# Patient Record
Sex: Female | Born: 1965 | Hispanic: Yes | Marital: Married | Smoking: Never smoker
Health system: Southern US, Community
[De-identification: ages and names within clinical notes are randomized; demographics above are authoritative.]

## PROBLEM LIST (undated history)

## (undated) ENCOUNTER — Encounter

## (undated) ENCOUNTER — Encounter: Payer: MEDICAID | Attending: Nephrology | Primary: Nephrology

## (undated) ENCOUNTER — Telehealth

## (undated) ENCOUNTER — Ambulatory Visit: Payer: MEDICAID | Attending: Nephrology | Primary: Nephrology

## (undated) ENCOUNTER — Ambulatory Visit

## (undated) ENCOUNTER — Encounter: Attending: Nephrology | Primary: Nephrology

## (undated) ENCOUNTER — Encounter: Attending: Obstetrics & Gynecology | Primary: Obstetrics & Gynecology

## (undated) ENCOUNTER — Encounter: Payer: MEDICAID | Attending: Surgery | Primary: Surgery

## (undated) ENCOUNTER — Encounter: Payer: MEDICARE | Attending: Nephrology | Primary: Nephrology

## (undated) ENCOUNTER — Encounter: Payer: MEDICAID | Attending: Urology | Primary: Urology

## (undated) ENCOUNTER — Ambulatory Visit: Payer: MEDICAID

## (undated) ENCOUNTER — Non-Acute Institutional Stay: Payer: MEDICAID

## (undated) ENCOUNTER — Encounter: Attending: Anesthesiology | Primary: Anesthesiology

## (undated) ENCOUNTER — Encounter: Attending: Physician Assistant | Primary: Physician Assistant

## (undated) ENCOUNTER — Ambulatory Visit: Attending: Surgery | Primary: Surgery

## (undated) DIAGNOSIS — D649 Anemia, unspecified: Secondary | ICD-10-CM

## (undated) DIAGNOSIS — M810 Age-related osteoporosis without current pathological fracture: Secondary | ICD-10-CM

## (undated) DIAGNOSIS — I499 Cardiac arrhythmia, unspecified: Secondary | ICD-10-CM

## (undated) DIAGNOSIS — K219 Gastro-esophageal reflux disease without esophagitis: Secondary | ICD-10-CM

## (undated) DIAGNOSIS — IMO0001 Reserved for inherently not codable concepts without codable children: Secondary | ICD-10-CM

## (undated) DIAGNOSIS — I10 Essential (primary) hypertension: Secondary | ICD-10-CM

## (undated) DIAGNOSIS — M329 Systemic lupus erythematosus, unspecified: Secondary | ICD-10-CM

## (undated) DIAGNOSIS — I739 Peripheral vascular disease, unspecified: Secondary | ICD-10-CM

## (undated) DIAGNOSIS — N189 Chronic kidney disease, unspecified: Secondary | ICD-10-CM

## (undated) DIAGNOSIS — IMO0002 Reserved for concepts with insufficient information to code with codable children: Secondary | ICD-10-CM

## (undated) DIAGNOSIS — C801 Malignant (primary) neoplasm, unspecified: Secondary | ICD-10-CM

## (undated) HISTORY — PX: ARTERIOVENOUS GRAFT PLACEMENT: SUR1029

## (undated) HISTORY — PX: OTHER SURGICAL HISTORY: SHX169

## (undated) HISTORY — PX: DILATION AND CURETTAGE OF UTERUS: SHX78

## (undated) MED ORDER — MYCOPHENOLATE SODIUM 180 MG TABLET,DELAYED RELEASE: 540 mg | tablet | Freq: Two times a day (BID) | 11 refills | 30 days | Status: CN

## (undated) MED ORDER — MAGNESIUM OXIDE 400 MG (241.3 MG MAGNESIUM) TABLET: 400 mg | tablet | Freq: Every morning | 11 refills | 30 days | Status: CN

## (undated) MED ORDER — TACROLIMUS 0.5 MG CAPSULE, IMMEDIATE-RELEASE: capsule | 11 refills | 0 days | Status: CN

---
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
## 2014-07-19 DIAGNOSIS — D123 Benign neoplasm of transverse colon: Secondary | ICD-10-CM | POA: Diagnosis not present

## 2014-07-19 DIAGNOSIS — K621 Rectal polyp: Secondary | ICD-10-CM | POA: Diagnosis not present

## 2014-07-19 DIAGNOSIS — K648 Other hemorrhoids: Secondary | ICD-10-CM | POA: Diagnosis not present

## 2014-07-19 DIAGNOSIS — Z1211 Encounter for screening for malignant neoplasm of colon: Secondary | ICD-10-CM | POA: Diagnosis not present

## 2015-01-06 DIAGNOSIS — D509 Iron deficiency anemia, unspecified: Secondary | ICD-10-CM | POA: Diagnosis not present

## 2015-01-06 DIAGNOSIS — K759 Inflammatory liver disease, unspecified: Secondary | ICD-10-CM | POA: Diagnosis not present

## 2015-01-06 DIAGNOSIS — N186 End stage renal disease: Secondary | ICD-10-CM | POA: Diagnosis not present

## 2015-01-06 DIAGNOSIS — D631 Anemia in chronic kidney disease: Secondary | ICD-10-CM | POA: Diagnosis not present

## 2015-01-06 DIAGNOSIS — N2581 Secondary hyperparathyroidism of renal origin: Secondary | ICD-10-CM | POA: Diagnosis not present

## 2015-01-09 DIAGNOSIS — D631 Anemia in chronic kidney disease: Secondary | ICD-10-CM | POA: Diagnosis not present

## 2015-01-09 DIAGNOSIS — N186 End stage renal disease: Secondary | ICD-10-CM | POA: Diagnosis not present

## 2015-01-09 DIAGNOSIS — E878 Other disorders of electrolyte and fluid balance, not elsewhere classified: Secondary | ICD-10-CM | POA: Diagnosis not present

## 2015-01-09 DIAGNOSIS — K759 Inflammatory liver disease, unspecified: Secondary | ICD-10-CM | POA: Diagnosis not present

## 2015-01-09 DIAGNOSIS — N2581 Secondary hyperparathyroidism of renal origin: Secondary | ICD-10-CM | POA: Diagnosis not present

## 2015-01-09 DIAGNOSIS — E785 Hyperlipidemia, unspecified: Secondary | ICD-10-CM | POA: Diagnosis not present

## 2015-01-09 DIAGNOSIS — R7989 Other specified abnormal findings of blood chemistry: Secondary | ICD-10-CM | POA: Diagnosis not present

## 2015-01-09 DIAGNOSIS — E559 Vitamin D deficiency, unspecified: Secondary | ICD-10-CM | POA: Diagnosis not present

## 2015-01-09 DIAGNOSIS — D509 Iron deficiency anemia, unspecified: Secondary | ICD-10-CM | POA: Diagnosis not present

## 2015-01-11 DIAGNOSIS — D631 Anemia in chronic kidney disease: Secondary | ICD-10-CM | POA: Diagnosis not present

## 2015-01-11 DIAGNOSIS — K759 Inflammatory liver disease, unspecified: Secondary | ICD-10-CM | POA: Diagnosis not present

## 2015-01-11 DIAGNOSIS — N186 End stage renal disease: Secondary | ICD-10-CM | POA: Diagnosis not present

## 2015-01-11 DIAGNOSIS — N2581 Secondary hyperparathyroidism of renal origin: Secondary | ICD-10-CM | POA: Diagnosis not present

## 2015-01-11 DIAGNOSIS — D509 Iron deficiency anemia, unspecified: Secondary | ICD-10-CM | POA: Diagnosis not present

## 2015-01-13 DIAGNOSIS — K759 Inflammatory liver disease, unspecified: Secondary | ICD-10-CM | POA: Diagnosis not present

## 2015-01-13 DIAGNOSIS — D631 Anemia in chronic kidney disease: Secondary | ICD-10-CM | POA: Diagnosis not present

## 2015-01-13 DIAGNOSIS — N186 End stage renal disease: Secondary | ICD-10-CM | POA: Diagnosis not present

## 2015-01-13 DIAGNOSIS — N2581 Secondary hyperparathyroidism of renal origin: Secondary | ICD-10-CM | POA: Diagnosis not present

## 2015-01-13 DIAGNOSIS — D509 Iron deficiency anemia, unspecified: Secondary | ICD-10-CM | POA: Diagnosis not present

## 2015-01-16 DIAGNOSIS — R1110 Vomiting, unspecified: Secondary | ICD-10-CM | POA: Diagnosis not present

## 2015-01-16 DIAGNOSIS — N179 Acute kidney failure, unspecified: Secondary | ICD-10-CM | POA: Diagnosis not present

## 2015-01-16 DIAGNOSIS — D509 Iron deficiency anemia, unspecified: Secondary | ICD-10-CM | POA: Diagnosis not present

## 2015-01-16 DIAGNOSIS — K759 Inflammatory liver disease, unspecified: Secondary | ICD-10-CM | POA: Diagnosis not present

## 2015-01-16 DIAGNOSIS — R079 Chest pain, unspecified: Secondary | ICD-10-CM | POA: Diagnosis not present

## 2015-01-16 DIAGNOSIS — N2581 Secondary hyperparathyroidism of renal origin: Secondary | ICD-10-CM | POA: Diagnosis not present

## 2015-01-16 DIAGNOSIS — D631 Anemia in chronic kidney disease: Secondary | ICD-10-CM | POA: Diagnosis not present

## 2015-01-16 DIAGNOSIS — N186 End stage renal disease: Secondary | ICD-10-CM | POA: Diagnosis not present

## 2015-01-16 DIAGNOSIS — D539 Nutritional anemia, unspecified: Secondary | ICD-10-CM | POA: Diagnosis not present

## 2015-01-16 DIAGNOSIS — R6883 Chills (without fever): Secondary | ICD-10-CM | POA: Diagnosis not present

## 2015-01-18 DIAGNOSIS — N2581 Secondary hyperparathyroidism of renal origin: Secondary | ICD-10-CM | POA: Diagnosis not present

## 2015-01-18 DIAGNOSIS — D509 Iron deficiency anemia, unspecified: Secondary | ICD-10-CM | POA: Diagnosis not present

## 2015-01-18 DIAGNOSIS — K759 Inflammatory liver disease, unspecified: Secondary | ICD-10-CM | POA: Diagnosis not present

## 2015-01-18 DIAGNOSIS — D631 Anemia in chronic kidney disease: Secondary | ICD-10-CM | POA: Diagnosis not present

## 2015-01-18 DIAGNOSIS — N186 End stage renal disease: Secondary | ICD-10-CM | POA: Diagnosis not present

## 2015-01-20 DIAGNOSIS — N2581 Secondary hyperparathyroidism of renal origin: Secondary | ICD-10-CM | POA: Diagnosis not present

## 2015-01-20 DIAGNOSIS — K759 Inflammatory liver disease, unspecified: Secondary | ICD-10-CM | POA: Diagnosis not present

## 2015-01-20 DIAGNOSIS — D509 Iron deficiency anemia, unspecified: Secondary | ICD-10-CM | POA: Diagnosis not present

## 2015-01-20 DIAGNOSIS — N186 End stage renal disease: Secondary | ICD-10-CM | POA: Diagnosis not present

## 2015-01-20 DIAGNOSIS — D631 Anemia in chronic kidney disease: Secondary | ICD-10-CM | POA: Diagnosis not present

## 2015-01-23 DIAGNOSIS — D509 Iron deficiency anemia, unspecified: Secondary | ICD-10-CM | POA: Diagnosis not present

## 2015-01-23 DIAGNOSIS — N2581 Secondary hyperparathyroidism of renal origin: Secondary | ICD-10-CM | POA: Diagnosis not present

## 2015-01-23 DIAGNOSIS — D631 Anemia in chronic kidney disease: Secondary | ICD-10-CM | POA: Diagnosis not present

## 2015-01-23 DIAGNOSIS — K759 Inflammatory liver disease, unspecified: Secondary | ICD-10-CM | POA: Diagnosis not present

## 2015-01-23 DIAGNOSIS — N186 End stage renal disease: Secondary | ICD-10-CM | POA: Diagnosis not present

## 2015-01-24 DIAGNOSIS — H02831 Dermatochalasis of right upper eyelid: Secondary | ICD-10-CM | POA: Diagnosis not present

## 2015-01-24 DIAGNOSIS — H04123 Dry eye syndrome of bilateral lacrimal glands: Secondary | ICD-10-CM | POA: Diagnosis not present

## 2015-01-24 DIAGNOSIS — I10 Essential (primary) hypertension: Secondary | ICD-10-CM | POA: Diagnosis not present

## 2015-01-24 DIAGNOSIS — H3531 Nonexudative age-related macular degeneration: Secondary | ICD-10-CM | POA: Diagnosis not present

## 2015-01-24 DIAGNOSIS — H52223 Regular astigmatism, bilateral: Secondary | ICD-10-CM | POA: Diagnosis not present

## 2015-01-24 DIAGNOSIS — H524 Presbyopia: Secondary | ICD-10-CM | POA: Diagnosis not present

## 2015-01-25 DIAGNOSIS — N186 End stage renal disease: Secondary | ICD-10-CM | POA: Diagnosis not present

## 2015-01-25 DIAGNOSIS — D631 Anemia in chronic kidney disease: Secondary | ICD-10-CM | POA: Diagnosis not present

## 2015-01-25 DIAGNOSIS — N2581 Secondary hyperparathyroidism of renal origin: Secondary | ICD-10-CM | POA: Diagnosis not present

## 2015-01-25 DIAGNOSIS — D509 Iron deficiency anemia, unspecified: Secondary | ICD-10-CM | POA: Diagnosis not present

## 2015-01-25 DIAGNOSIS — K759 Inflammatory liver disease, unspecified: Secondary | ICD-10-CM | POA: Diagnosis not present

## 2015-01-27 DIAGNOSIS — N2581 Secondary hyperparathyroidism of renal origin: Secondary | ICD-10-CM | POA: Diagnosis not present

## 2015-01-27 DIAGNOSIS — K759 Inflammatory liver disease, unspecified: Secondary | ICD-10-CM | POA: Diagnosis not present

## 2015-01-27 DIAGNOSIS — D631 Anemia in chronic kidney disease: Secondary | ICD-10-CM | POA: Diagnosis not present

## 2015-01-27 DIAGNOSIS — N186 End stage renal disease: Secondary | ICD-10-CM | POA: Diagnosis not present

## 2015-01-27 DIAGNOSIS — D509 Iron deficiency anemia, unspecified: Secondary | ICD-10-CM | POA: Diagnosis not present

## 2015-01-28 DIAGNOSIS — D696 Thrombocytopenia, unspecified: Secondary | ICD-10-CM | POA: Diagnosis not present

## 2015-01-30 DIAGNOSIS — N186 End stage renal disease: Secondary | ICD-10-CM | POA: Diagnosis not present

## 2015-01-30 DIAGNOSIS — D509 Iron deficiency anemia, unspecified: Secondary | ICD-10-CM | POA: Diagnosis not present

## 2015-01-30 DIAGNOSIS — N2581 Secondary hyperparathyroidism of renal origin: Secondary | ICD-10-CM | POA: Diagnosis not present

## 2015-01-30 DIAGNOSIS — D631 Anemia in chronic kidney disease: Secondary | ICD-10-CM | POA: Diagnosis not present

## 2015-01-30 DIAGNOSIS — K759 Inflammatory liver disease, unspecified: Secondary | ICD-10-CM | POA: Diagnosis not present

## 2015-02-01 DIAGNOSIS — N186 End stage renal disease: Secondary | ICD-10-CM | POA: Diagnosis not present

## 2015-02-01 DIAGNOSIS — K759 Inflammatory liver disease, unspecified: Secondary | ICD-10-CM | POA: Diagnosis not present

## 2015-02-01 DIAGNOSIS — D631 Anemia in chronic kidney disease: Secondary | ICD-10-CM | POA: Diagnosis not present

## 2015-02-01 DIAGNOSIS — D509 Iron deficiency anemia, unspecified: Secondary | ICD-10-CM | POA: Diagnosis not present

## 2015-02-01 DIAGNOSIS — N2581 Secondary hyperparathyroidism of renal origin: Secondary | ICD-10-CM | POA: Diagnosis not present

## 2015-02-03 DIAGNOSIS — D631 Anemia in chronic kidney disease: Secondary | ICD-10-CM | POA: Diagnosis not present

## 2015-02-03 DIAGNOSIS — D509 Iron deficiency anemia, unspecified: Secondary | ICD-10-CM | POA: Diagnosis not present

## 2015-02-03 DIAGNOSIS — K759 Inflammatory liver disease, unspecified: Secondary | ICD-10-CM | POA: Diagnosis not present

## 2015-02-03 DIAGNOSIS — N186 End stage renal disease: Secondary | ICD-10-CM | POA: Diagnosis not present

## 2015-02-03 DIAGNOSIS — N2581 Secondary hyperparathyroidism of renal origin: Secondary | ICD-10-CM | POA: Diagnosis not present

## 2015-02-06 DIAGNOSIS — K759 Inflammatory liver disease, unspecified: Secondary | ICD-10-CM | POA: Diagnosis not present

## 2015-02-06 DIAGNOSIS — D509 Iron deficiency anemia, unspecified: Secondary | ICD-10-CM | POA: Diagnosis not present

## 2015-02-06 DIAGNOSIS — N186 End stage renal disease: Secondary | ICD-10-CM | POA: Diagnosis not present

## 2015-02-06 DIAGNOSIS — E785 Hyperlipidemia, unspecified: Secondary | ICD-10-CM | POA: Diagnosis not present

## 2015-02-06 DIAGNOSIS — E559 Vitamin D deficiency, unspecified: Secondary | ICD-10-CM | POA: Diagnosis not present

## 2015-02-06 DIAGNOSIS — D631 Anemia in chronic kidney disease: Secondary | ICD-10-CM | POA: Diagnosis not present

## 2015-02-06 DIAGNOSIS — E878 Other disorders of electrolyte and fluid balance, not elsewhere classified: Secondary | ICD-10-CM | POA: Diagnosis not present

## 2015-02-06 DIAGNOSIS — N2581 Secondary hyperparathyroidism of renal origin: Secondary | ICD-10-CM | POA: Diagnosis not present

## 2015-02-08 DIAGNOSIS — K759 Inflammatory liver disease, unspecified: Secondary | ICD-10-CM | POA: Diagnosis not present

## 2015-02-08 DIAGNOSIS — D509 Iron deficiency anemia, unspecified: Secondary | ICD-10-CM | POA: Diagnosis not present

## 2015-02-08 DIAGNOSIS — N2581 Secondary hyperparathyroidism of renal origin: Secondary | ICD-10-CM | POA: Diagnosis not present

## 2015-02-08 DIAGNOSIS — D631 Anemia in chronic kidney disease: Secondary | ICD-10-CM | POA: Diagnosis not present

## 2015-02-08 DIAGNOSIS — N186 End stage renal disease: Secondary | ICD-10-CM | POA: Diagnosis not present

## 2015-02-10 DIAGNOSIS — K759 Inflammatory liver disease, unspecified: Secondary | ICD-10-CM | POA: Diagnosis not present

## 2015-02-10 DIAGNOSIS — N186 End stage renal disease: Secondary | ICD-10-CM | POA: Diagnosis not present

## 2015-02-10 DIAGNOSIS — D631 Anemia in chronic kidney disease: Secondary | ICD-10-CM | POA: Diagnosis not present

## 2015-02-10 DIAGNOSIS — D509 Iron deficiency anemia, unspecified: Secondary | ICD-10-CM | POA: Diagnosis not present

## 2015-02-10 DIAGNOSIS — N2581 Secondary hyperparathyroidism of renal origin: Secondary | ICD-10-CM | POA: Diagnosis not present

## 2015-02-14 DIAGNOSIS — Z992 Dependence on renal dialysis: Secondary | ICD-10-CM | POA: Diagnosis not present

## 2015-02-14 DIAGNOSIS — N186 End stage renal disease: Secondary | ICD-10-CM | POA: Diagnosis not present

## 2015-02-14 DIAGNOSIS — E611 Iron deficiency: Secondary | ICD-10-CM | POA: Diagnosis not present

## 2015-02-14 DIAGNOSIS — D631 Anemia in chronic kidney disease: Secondary | ICD-10-CM | POA: Diagnosis not present

## 2015-02-16 DIAGNOSIS — Z992 Dependence on renal dialysis: Secondary | ICD-10-CM | POA: Diagnosis not present

## 2015-02-16 DIAGNOSIS — D631 Anemia in chronic kidney disease: Secondary | ICD-10-CM | POA: Diagnosis not present

## 2015-02-16 DIAGNOSIS — N186 End stage renal disease: Secondary | ICD-10-CM | POA: Diagnosis not present

## 2015-02-16 DIAGNOSIS — E611 Iron deficiency: Secondary | ICD-10-CM | POA: Diagnosis not present

## 2015-02-18 DIAGNOSIS — N186 End stage renal disease: Secondary | ICD-10-CM | POA: Diagnosis not present

## 2015-02-18 DIAGNOSIS — E611 Iron deficiency: Secondary | ICD-10-CM | POA: Diagnosis not present

## 2015-02-18 DIAGNOSIS — Z992 Dependence on renal dialysis: Secondary | ICD-10-CM | POA: Diagnosis not present

## 2015-02-18 DIAGNOSIS — D631 Anemia in chronic kidney disease: Secondary | ICD-10-CM | POA: Diagnosis not present

## 2015-02-21 DIAGNOSIS — D631 Anemia in chronic kidney disease: Secondary | ICD-10-CM | POA: Diagnosis not present

## 2015-02-21 DIAGNOSIS — N186 End stage renal disease: Secondary | ICD-10-CM | POA: Diagnosis not present

## 2015-02-21 DIAGNOSIS — E611 Iron deficiency: Secondary | ICD-10-CM | POA: Diagnosis not present

## 2015-02-21 DIAGNOSIS — Z992 Dependence on renal dialysis: Secondary | ICD-10-CM | POA: Diagnosis not present

## 2015-02-23 DIAGNOSIS — E611 Iron deficiency: Secondary | ICD-10-CM | POA: Diagnosis not present

## 2015-02-23 DIAGNOSIS — Z992 Dependence on renal dialysis: Secondary | ICD-10-CM | POA: Diagnosis not present

## 2015-02-23 DIAGNOSIS — D631 Anemia in chronic kidney disease: Secondary | ICD-10-CM | POA: Diagnosis not present

## 2015-02-23 DIAGNOSIS — N186 End stage renal disease: Secondary | ICD-10-CM | POA: Diagnosis not present

## 2015-02-25 DIAGNOSIS — D631 Anemia in chronic kidney disease: Secondary | ICD-10-CM | POA: Diagnosis not present

## 2015-02-25 DIAGNOSIS — N186 End stage renal disease: Secondary | ICD-10-CM | POA: Diagnosis not present

## 2015-02-25 DIAGNOSIS — Z992 Dependence on renal dialysis: Secondary | ICD-10-CM | POA: Diagnosis not present

## 2015-02-25 DIAGNOSIS — E611 Iron deficiency: Secondary | ICD-10-CM | POA: Diagnosis not present

## 2015-02-27 DIAGNOSIS — N2581 Secondary hyperparathyroidism of renal origin: Secondary | ICD-10-CM | POA: Diagnosis not present

## 2015-02-27 DIAGNOSIS — N186 End stage renal disease: Secondary | ICD-10-CM | POA: Diagnosis not present

## 2015-02-27 DIAGNOSIS — K759 Inflammatory liver disease, unspecified: Secondary | ICD-10-CM | POA: Diagnosis not present

## 2015-02-27 DIAGNOSIS — B199 Unspecified viral hepatitis without hepatic coma: Secondary | ICD-10-CM | POA: Diagnosis not present

## 2015-02-27 DIAGNOSIS — D631 Anemia in chronic kidney disease: Secondary | ICD-10-CM | POA: Diagnosis not present

## 2015-02-27 DIAGNOSIS — D509 Iron deficiency anemia, unspecified: Secondary | ICD-10-CM | POA: Diagnosis not present

## 2015-02-28 DIAGNOSIS — I10 Essential (primary) hypertension: Secondary | ICD-10-CM | POA: Diagnosis not present

## 2015-02-28 DIAGNOSIS — H02831 Dermatochalasis of right upper eyelid: Secondary | ICD-10-CM | POA: Diagnosis not present

## 2015-03-01 DIAGNOSIS — N2581 Secondary hyperparathyroidism of renal origin: Secondary | ICD-10-CM | POA: Diagnosis not present

## 2015-03-01 DIAGNOSIS — D509 Iron deficiency anemia, unspecified: Secondary | ICD-10-CM | POA: Diagnosis not present

## 2015-03-01 DIAGNOSIS — D631 Anemia in chronic kidney disease: Secondary | ICD-10-CM | POA: Diagnosis not present

## 2015-03-01 DIAGNOSIS — N186 End stage renal disease: Secondary | ICD-10-CM | POA: Diagnosis not present

## 2015-03-01 DIAGNOSIS — K759 Inflammatory liver disease, unspecified: Secondary | ICD-10-CM | POA: Diagnosis not present

## 2015-03-03 DIAGNOSIS — D631 Anemia in chronic kidney disease: Secondary | ICD-10-CM | POA: Diagnosis not present

## 2015-03-03 DIAGNOSIS — N186 End stage renal disease: Secondary | ICD-10-CM | POA: Diagnosis not present

## 2015-03-03 DIAGNOSIS — K759 Inflammatory liver disease, unspecified: Secondary | ICD-10-CM | POA: Diagnosis not present

## 2015-03-03 DIAGNOSIS — N2581 Secondary hyperparathyroidism of renal origin: Secondary | ICD-10-CM | POA: Diagnosis not present

## 2015-03-03 DIAGNOSIS — D509 Iron deficiency anemia, unspecified: Secondary | ICD-10-CM | POA: Diagnosis not present

## 2015-03-06 DIAGNOSIS — N186 End stage renal disease: Secondary | ICD-10-CM | POA: Diagnosis not present

## 2015-03-06 DIAGNOSIS — N2581 Secondary hyperparathyroidism of renal origin: Secondary | ICD-10-CM | POA: Diagnosis not present

## 2015-03-06 DIAGNOSIS — D631 Anemia in chronic kidney disease: Secondary | ICD-10-CM | POA: Diagnosis not present

## 2015-03-06 DIAGNOSIS — D509 Iron deficiency anemia, unspecified: Secondary | ICD-10-CM | POA: Diagnosis not present

## 2015-03-06 DIAGNOSIS — K759 Inflammatory liver disease, unspecified: Secondary | ICD-10-CM | POA: Diagnosis not present

## 2015-03-08 DIAGNOSIS — N186 End stage renal disease: Secondary | ICD-10-CM | POA: Diagnosis not present

## 2015-03-08 DIAGNOSIS — D631 Anemia in chronic kidney disease: Secondary | ICD-10-CM | POA: Diagnosis not present

## 2015-03-08 DIAGNOSIS — D509 Iron deficiency anemia, unspecified: Secondary | ICD-10-CM | POA: Diagnosis not present

## 2015-03-08 DIAGNOSIS — N2581 Secondary hyperparathyroidism of renal origin: Secondary | ICD-10-CM | POA: Diagnosis not present

## 2015-03-08 DIAGNOSIS — K759 Inflammatory liver disease, unspecified: Secondary | ICD-10-CM | POA: Diagnosis not present

## 2015-03-10 DIAGNOSIS — D509 Iron deficiency anemia, unspecified: Secondary | ICD-10-CM | POA: Diagnosis not present

## 2015-03-10 DIAGNOSIS — N186 End stage renal disease: Secondary | ICD-10-CM | POA: Diagnosis not present

## 2015-03-10 DIAGNOSIS — D631 Anemia in chronic kidney disease: Secondary | ICD-10-CM | POA: Diagnosis not present

## 2015-03-10 DIAGNOSIS — N2581 Secondary hyperparathyroidism of renal origin: Secondary | ICD-10-CM | POA: Diagnosis not present

## 2015-03-13 DIAGNOSIS — E878 Other disorders of electrolyte and fluid balance, not elsewhere classified: Secondary | ICD-10-CM | POA: Diagnosis not present

## 2015-03-13 DIAGNOSIS — D509 Iron deficiency anemia, unspecified: Secondary | ICD-10-CM | POA: Diagnosis not present

## 2015-03-13 DIAGNOSIS — D631 Anemia in chronic kidney disease: Secondary | ICD-10-CM | POA: Diagnosis not present

## 2015-03-13 DIAGNOSIS — E559 Vitamin D deficiency, unspecified: Secondary | ICD-10-CM | POA: Diagnosis not present

## 2015-03-13 DIAGNOSIS — E785 Hyperlipidemia, unspecified: Secondary | ICD-10-CM | POA: Diagnosis not present

## 2015-03-13 DIAGNOSIS — N2581 Secondary hyperparathyroidism of renal origin: Secondary | ICD-10-CM | POA: Diagnosis not present

## 2015-03-13 DIAGNOSIS — N186 End stage renal disease: Secondary | ICD-10-CM | POA: Diagnosis not present

## 2015-03-15 DIAGNOSIS — N2581 Secondary hyperparathyroidism of renal origin: Secondary | ICD-10-CM | POA: Diagnosis not present

## 2015-03-15 DIAGNOSIS — D509 Iron deficiency anemia, unspecified: Secondary | ICD-10-CM | POA: Diagnosis not present

## 2015-03-15 DIAGNOSIS — D631 Anemia in chronic kidney disease: Secondary | ICD-10-CM | POA: Diagnosis not present

## 2015-03-15 DIAGNOSIS — N186 End stage renal disease: Secondary | ICD-10-CM | POA: Diagnosis not present

## 2015-03-16 DIAGNOSIS — H02831 Dermatochalasis of right upper eyelid: Secondary | ICD-10-CM | POA: Diagnosis not present

## 2015-03-16 DIAGNOSIS — H04123 Dry eye syndrome of bilateral lacrimal glands: Secondary | ICD-10-CM | POA: Diagnosis not present

## 2015-03-17 DIAGNOSIS — N2581 Secondary hyperparathyroidism of renal origin: Secondary | ICD-10-CM | POA: Diagnosis not present

## 2015-03-17 DIAGNOSIS — N186 End stage renal disease: Secondary | ICD-10-CM | POA: Diagnosis not present

## 2015-03-17 DIAGNOSIS — D509 Iron deficiency anemia, unspecified: Secondary | ICD-10-CM | POA: Diagnosis not present

## 2015-03-17 DIAGNOSIS — D631 Anemia in chronic kidney disease: Secondary | ICD-10-CM | POA: Diagnosis not present

## 2015-03-20 DIAGNOSIS — D631 Anemia in chronic kidney disease: Secondary | ICD-10-CM | POA: Diagnosis not present

## 2015-03-20 DIAGNOSIS — D509 Iron deficiency anemia, unspecified: Secondary | ICD-10-CM | POA: Diagnosis not present

## 2015-03-20 DIAGNOSIS — N186 End stage renal disease: Secondary | ICD-10-CM | POA: Diagnosis not present

## 2015-03-20 DIAGNOSIS — N2581 Secondary hyperparathyroidism of renal origin: Secondary | ICD-10-CM | POA: Diagnosis not present

## 2015-03-22 DIAGNOSIS — D509 Iron deficiency anemia, unspecified: Secondary | ICD-10-CM | POA: Diagnosis not present

## 2015-03-22 DIAGNOSIS — N2581 Secondary hyperparathyroidism of renal origin: Secondary | ICD-10-CM | POA: Diagnosis not present

## 2015-03-22 DIAGNOSIS — D631 Anemia in chronic kidney disease: Secondary | ICD-10-CM | POA: Diagnosis not present

## 2015-03-22 DIAGNOSIS — N186 End stage renal disease: Secondary | ICD-10-CM | POA: Diagnosis not present

## 2015-03-24 DIAGNOSIS — N2581 Secondary hyperparathyroidism of renal origin: Secondary | ICD-10-CM | POA: Diagnosis not present

## 2015-03-24 DIAGNOSIS — N186 End stage renal disease: Secondary | ICD-10-CM | POA: Diagnosis not present

## 2015-03-24 DIAGNOSIS — D631 Anemia in chronic kidney disease: Secondary | ICD-10-CM | POA: Diagnosis not present

## 2015-03-24 DIAGNOSIS — D509 Iron deficiency anemia, unspecified: Secondary | ICD-10-CM | POA: Diagnosis not present

## 2015-03-27 DIAGNOSIS — D509 Iron deficiency anemia, unspecified: Secondary | ICD-10-CM | POA: Diagnosis not present

## 2015-03-27 DIAGNOSIS — N186 End stage renal disease: Secondary | ICD-10-CM | POA: Diagnosis not present

## 2015-03-27 DIAGNOSIS — D631 Anemia in chronic kidney disease: Secondary | ICD-10-CM | POA: Diagnosis not present

## 2015-03-27 DIAGNOSIS — N2581 Secondary hyperparathyroidism of renal origin: Secondary | ICD-10-CM | POA: Diagnosis not present

## 2015-03-29 DIAGNOSIS — N186 End stage renal disease: Secondary | ICD-10-CM | POA: Diagnosis not present

## 2015-03-29 DIAGNOSIS — D631 Anemia in chronic kidney disease: Secondary | ICD-10-CM | POA: Diagnosis not present

## 2015-03-29 DIAGNOSIS — D509 Iron deficiency anemia, unspecified: Secondary | ICD-10-CM | POA: Diagnosis not present

## 2015-03-29 DIAGNOSIS — N2581 Secondary hyperparathyroidism of renal origin: Secondary | ICD-10-CM | POA: Diagnosis not present

## 2015-03-31 DIAGNOSIS — D509 Iron deficiency anemia, unspecified: Secondary | ICD-10-CM | POA: Diagnosis not present

## 2015-03-31 DIAGNOSIS — N186 End stage renal disease: Secondary | ICD-10-CM | POA: Diagnosis not present

## 2015-03-31 DIAGNOSIS — D631 Anemia in chronic kidney disease: Secondary | ICD-10-CM | POA: Diagnosis not present

## 2015-03-31 DIAGNOSIS — N2581 Secondary hyperparathyroidism of renal origin: Secondary | ICD-10-CM | POA: Diagnosis not present

## 2015-04-03 DIAGNOSIS — D631 Anemia in chronic kidney disease: Secondary | ICD-10-CM | POA: Diagnosis not present

## 2015-04-03 DIAGNOSIS — N186 End stage renal disease: Secondary | ICD-10-CM | POA: Diagnosis not present

## 2015-04-03 DIAGNOSIS — N2581 Secondary hyperparathyroidism of renal origin: Secondary | ICD-10-CM | POA: Diagnosis not present

## 2015-04-03 DIAGNOSIS — D509 Iron deficiency anemia, unspecified: Secondary | ICD-10-CM | POA: Diagnosis not present

## 2015-04-05 DIAGNOSIS — N186 End stage renal disease: Secondary | ICD-10-CM | POA: Diagnosis not present

## 2015-04-05 DIAGNOSIS — D631 Anemia in chronic kidney disease: Secondary | ICD-10-CM | POA: Diagnosis not present

## 2015-04-05 DIAGNOSIS — N2581 Secondary hyperparathyroidism of renal origin: Secondary | ICD-10-CM | POA: Diagnosis not present

## 2015-04-05 DIAGNOSIS — D509 Iron deficiency anemia, unspecified: Secondary | ICD-10-CM | POA: Diagnosis not present

## 2015-04-07 DIAGNOSIS — N186 End stage renal disease: Secondary | ICD-10-CM | POA: Diagnosis not present

## 2015-04-07 DIAGNOSIS — D509 Iron deficiency anemia, unspecified: Secondary | ICD-10-CM | POA: Diagnosis not present

## 2015-04-07 DIAGNOSIS — D631 Anemia in chronic kidney disease: Secondary | ICD-10-CM | POA: Diagnosis not present

## 2015-04-07 DIAGNOSIS — N2581 Secondary hyperparathyroidism of renal origin: Secondary | ICD-10-CM | POA: Diagnosis not present

## 2015-04-08 DIAGNOSIS — Z992 Dependence on renal dialysis: Secondary | ICD-10-CM | POA: Diagnosis not present

## 2015-04-08 DIAGNOSIS — N186 End stage renal disease: Secondary | ICD-10-CM | POA: Diagnosis not present

## 2015-04-10 DIAGNOSIS — N2581 Secondary hyperparathyroidism of renal origin: Secondary | ICD-10-CM | POA: Diagnosis not present

## 2015-04-10 DIAGNOSIS — K759 Inflammatory liver disease, unspecified: Secondary | ICD-10-CM | POA: Diagnosis not present

## 2015-04-10 DIAGNOSIS — D509 Iron deficiency anemia, unspecified: Secondary | ICD-10-CM | POA: Diagnosis not present

## 2015-04-10 DIAGNOSIS — N186 End stage renal disease: Secondary | ICD-10-CM | POA: Diagnosis not present

## 2015-04-10 DIAGNOSIS — E878 Other disorders of electrolyte and fluid balance, not elsewhere classified: Secondary | ICD-10-CM | POA: Diagnosis not present

## 2015-04-10 DIAGNOSIS — E559 Vitamin D deficiency, unspecified: Secondary | ICD-10-CM | POA: Diagnosis not present

## 2015-04-10 DIAGNOSIS — E785 Hyperlipidemia, unspecified: Secondary | ICD-10-CM | POA: Diagnosis not present

## 2015-04-10 DIAGNOSIS — R7989 Other specified abnormal findings of blood chemistry: Secondary | ICD-10-CM | POA: Diagnosis not present

## 2015-04-10 DIAGNOSIS — D631 Anemia in chronic kidney disease: Secondary | ICD-10-CM | POA: Diagnosis not present

## 2015-04-12 DIAGNOSIS — N186 End stage renal disease: Secondary | ICD-10-CM | POA: Diagnosis not present

## 2015-04-12 DIAGNOSIS — D631 Anemia in chronic kidney disease: Secondary | ICD-10-CM | POA: Diagnosis not present

## 2015-04-12 DIAGNOSIS — N2581 Secondary hyperparathyroidism of renal origin: Secondary | ICD-10-CM | POA: Diagnosis not present

## 2015-04-12 DIAGNOSIS — K759 Inflammatory liver disease, unspecified: Secondary | ICD-10-CM | POA: Diagnosis not present

## 2015-04-12 DIAGNOSIS — D509 Iron deficiency anemia, unspecified: Secondary | ICD-10-CM | POA: Diagnosis not present

## 2015-04-14 DIAGNOSIS — D631 Anemia in chronic kidney disease: Secondary | ICD-10-CM | POA: Diagnosis not present

## 2015-04-14 DIAGNOSIS — D509 Iron deficiency anemia, unspecified: Secondary | ICD-10-CM | POA: Diagnosis not present

## 2015-04-14 DIAGNOSIS — N2581 Secondary hyperparathyroidism of renal origin: Secondary | ICD-10-CM | POA: Diagnosis not present

## 2015-04-14 DIAGNOSIS — K759 Inflammatory liver disease, unspecified: Secondary | ICD-10-CM | POA: Diagnosis not present

## 2015-04-14 DIAGNOSIS — N186 End stage renal disease: Secondary | ICD-10-CM | POA: Diagnosis not present

## 2015-04-18 DIAGNOSIS — Z992 Dependence on renal dialysis: Secondary | ICD-10-CM | POA: Diagnosis not present

## 2015-04-18 DIAGNOSIS — D509 Iron deficiency anemia, unspecified: Secondary | ICD-10-CM | POA: Diagnosis not present

## 2015-04-18 DIAGNOSIS — N186 End stage renal disease: Secondary | ICD-10-CM | POA: Diagnosis not present

## 2015-04-18 DIAGNOSIS — D631 Anemia in chronic kidney disease: Secondary | ICD-10-CM | POA: Diagnosis not present

## 2015-04-18 DIAGNOSIS — N2581 Secondary hyperparathyroidism of renal origin: Secondary | ICD-10-CM | POA: Diagnosis not present

## 2015-04-20 DIAGNOSIS — Z992 Dependence on renal dialysis: Secondary | ICD-10-CM | POA: Diagnosis not present

## 2015-04-20 DIAGNOSIS — D631 Anemia in chronic kidney disease: Secondary | ICD-10-CM | POA: Diagnosis not present

## 2015-04-20 DIAGNOSIS — N186 End stage renal disease: Secondary | ICD-10-CM | POA: Diagnosis not present

## 2015-04-20 DIAGNOSIS — D509 Iron deficiency anemia, unspecified: Secondary | ICD-10-CM | POA: Diagnosis not present

## 2015-04-20 DIAGNOSIS — N2581 Secondary hyperparathyroidism of renal origin: Secondary | ICD-10-CM | POA: Diagnosis not present

## 2015-04-22 DIAGNOSIS — N186 End stage renal disease: Secondary | ICD-10-CM | POA: Diagnosis not present

## 2015-04-22 DIAGNOSIS — N2581 Secondary hyperparathyroidism of renal origin: Secondary | ICD-10-CM | POA: Diagnosis not present

## 2015-04-22 DIAGNOSIS — Z992 Dependence on renal dialysis: Secondary | ICD-10-CM | POA: Diagnosis not present

## 2015-04-22 DIAGNOSIS — D509 Iron deficiency anemia, unspecified: Secondary | ICD-10-CM | POA: Diagnosis not present

## 2015-04-22 DIAGNOSIS — D631 Anemia in chronic kidney disease: Secondary | ICD-10-CM | POA: Diagnosis not present

## 2015-04-25 DIAGNOSIS — N186 End stage renal disease: Secondary | ICD-10-CM | POA: Diagnosis not present

## 2015-04-25 DIAGNOSIS — D631 Anemia in chronic kidney disease: Secondary | ICD-10-CM | POA: Diagnosis not present

## 2015-04-25 DIAGNOSIS — D509 Iron deficiency anemia, unspecified: Secondary | ICD-10-CM | POA: Diagnosis not present

## 2015-04-25 DIAGNOSIS — Z992 Dependence on renal dialysis: Secondary | ICD-10-CM | POA: Diagnosis not present

## 2015-04-25 DIAGNOSIS — N2581 Secondary hyperparathyroidism of renal origin: Secondary | ICD-10-CM | POA: Diagnosis not present

## 2015-04-27 DIAGNOSIS — Z992 Dependence on renal dialysis: Secondary | ICD-10-CM | POA: Diagnosis not present

## 2015-04-27 DIAGNOSIS — N186 End stage renal disease: Secondary | ICD-10-CM | POA: Diagnosis not present

## 2015-04-27 DIAGNOSIS — D631 Anemia in chronic kidney disease: Secondary | ICD-10-CM | POA: Diagnosis not present

## 2015-04-27 DIAGNOSIS — N2581 Secondary hyperparathyroidism of renal origin: Secondary | ICD-10-CM | POA: Diagnosis not present

## 2015-04-27 DIAGNOSIS — D509 Iron deficiency anemia, unspecified: Secondary | ICD-10-CM | POA: Diagnosis not present

## 2015-04-29 DIAGNOSIS — D631 Anemia in chronic kidney disease: Secondary | ICD-10-CM | POA: Diagnosis not present

## 2015-04-29 DIAGNOSIS — N2581 Secondary hyperparathyroidism of renal origin: Secondary | ICD-10-CM | POA: Diagnosis not present

## 2015-04-29 DIAGNOSIS — Z992 Dependence on renal dialysis: Secondary | ICD-10-CM | POA: Diagnosis not present

## 2015-04-29 DIAGNOSIS — D509 Iron deficiency anemia, unspecified: Secondary | ICD-10-CM | POA: Diagnosis not present

## 2015-04-29 DIAGNOSIS — N186 End stage renal disease: Secondary | ICD-10-CM | POA: Diagnosis not present

## 2015-05-02 DIAGNOSIS — N186 End stage renal disease: Secondary | ICD-10-CM | POA: Diagnosis not present

## 2015-05-02 DIAGNOSIS — D509 Iron deficiency anemia, unspecified: Secondary | ICD-10-CM | POA: Diagnosis not present

## 2015-05-02 DIAGNOSIS — Z992 Dependence on renal dialysis: Secondary | ICD-10-CM | POA: Diagnosis not present

## 2015-05-02 DIAGNOSIS — N2581 Secondary hyperparathyroidism of renal origin: Secondary | ICD-10-CM | POA: Diagnosis not present

## 2015-05-02 DIAGNOSIS — D631 Anemia in chronic kidney disease: Secondary | ICD-10-CM | POA: Diagnosis not present

## 2015-05-04 DIAGNOSIS — N186 End stage renal disease: Secondary | ICD-10-CM | POA: Diagnosis not present

## 2015-05-04 DIAGNOSIS — D631 Anemia in chronic kidney disease: Secondary | ICD-10-CM | POA: Diagnosis not present

## 2015-05-04 DIAGNOSIS — N2581 Secondary hyperparathyroidism of renal origin: Secondary | ICD-10-CM | POA: Diagnosis not present

## 2015-05-04 DIAGNOSIS — D509 Iron deficiency anemia, unspecified: Secondary | ICD-10-CM | POA: Diagnosis not present

## 2015-05-04 DIAGNOSIS — Z992 Dependence on renal dialysis: Secondary | ICD-10-CM | POA: Diagnosis not present

## 2015-05-06 DIAGNOSIS — N2581 Secondary hyperparathyroidism of renal origin: Secondary | ICD-10-CM | POA: Diagnosis not present

## 2015-05-06 DIAGNOSIS — D509 Iron deficiency anemia, unspecified: Secondary | ICD-10-CM | POA: Diagnosis not present

## 2015-05-06 DIAGNOSIS — N186 End stage renal disease: Secondary | ICD-10-CM | POA: Diagnosis not present

## 2015-05-06 DIAGNOSIS — Z992 Dependence on renal dialysis: Secondary | ICD-10-CM | POA: Diagnosis not present

## 2015-05-06 DIAGNOSIS — D631 Anemia in chronic kidney disease: Secondary | ICD-10-CM | POA: Diagnosis not present

## 2015-05-09 DIAGNOSIS — D509 Iron deficiency anemia, unspecified: Secondary | ICD-10-CM | POA: Diagnosis not present

## 2015-05-09 DIAGNOSIS — Z992 Dependence on renal dialysis: Secondary | ICD-10-CM | POA: Diagnosis not present

## 2015-05-09 DIAGNOSIS — N186 End stage renal disease: Secondary | ICD-10-CM | POA: Diagnosis not present

## 2015-05-09 DIAGNOSIS — D631 Anemia in chronic kidney disease: Secondary | ICD-10-CM | POA: Diagnosis not present

## 2015-05-09 DIAGNOSIS — Z23 Encounter for immunization: Secondary | ICD-10-CM | POA: Diagnosis not present

## 2015-05-09 DIAGNOSIS — E559 Vitamin D deficiency, unspecified: Secondary | ICD-10-CM | POA: Diagnosis not present

## 2015-05-09 DIAGNOSIS — N2581 Secondary hyperparathyroidism of renal origin: Secondary | ICD-10-CM | POA: Diagnosis not present

## 2015-05-11 DIAGNOSIS — E559 Vitamin D deficiency, unspecified: Secondary | ICD-10-CM | POA: Diagnosis not present

## 2015-05-11 DIAGNOSIS — Z23 Encounter for immunization: Secondary | ICD-10-CM | POA: Diagnosis not present

## 2015-05-11 DIAGNOSIS — D631 Anemia in chronic kidney disease: Secondary | ICD-10-CM | POA: Diagnosis not present

## 2015-05-11 DIAGNOSIS — N186 End stage renal disease: Secondary | ICD-10-CM | POA: Diagnosis not present

## 2015-05-11 DIAGNOSIS — N2581 Secondary hyperparathyroidism of renal origin: Secondary | ICD-10-CM | POA: Diagnosis not present

## 2015-05-11 DIAGNOSIS — D509 Iron deficiency anemia, unspecified: Secondary | ICD-10-CM | POA: Diagnosis not present

## 2015-05-13 DIAGNOSIS — D509 Iron deficiency anemia, unspecified: Secondary | ICD-10-CM | POA: Diagnosis not present

## 2015-05-13 DIAGNOSIS — E559 Vitamin D deficiency, unspecified: Secondary | ICD-10-CM | POA: Diagnosis not present

## 2015-05-13 DIAGNOSIS — D631 Anemia in chronic kidney disease: Secondary | ICD-10-CM | POA: Diagnosis not present

## 2015-05-13 DIAGNOSIS — Z23 Encounter for immunization: Secondary | ICD-10-CM | POA: Diagnosis not present

## 2015-05-13 DIAGNOSIS — N186 End stage renal disease: Secondary | ICD-10-CM | POA: Diagnosis not present

## 2015-05-13 DIAGNOSIS — N2581 Secondary hyperparathyroidism of renal origin: Secondary | ICD-10-CM | POA: Diagnosis not present

## 2015-05-16 DIAGNOSIS — D631 Anemia in chronic kidney disease: Secondary | ICD-10-CM | POA: Diagnosis not present

## 2015-05-16 DIAGNOSIS — N186 End stage renal disease: Secondary | ICD-10-CM | POA: Diagnosis not present

## 2015-05-16 DIAGNOSIS — N2581 Secondary hyperparathyroidism of renal origin: Secondary | ICD-10-CM | POA: Diagnosis not present

## 2015-05-16 DIAGNOSIS — Z23 Encounter for immunization: Secondary | ICD-10-CM | POA: Diagnosis not present

## 2015-05-16 DIAGNOSIS — D509 Iron deficiency anemia, unspecified: Secondary | ICD-10-CM | POA: Diagnosis not present

## 2015-05-16 DIAGNOSIS — E559 Vitamin D deficiency, unspecified: Secondary | ICD-10-CM | POA: Diagnosis not present

## 2015-05-18 DIAGNOSIS — E559 Vitamin D deficiency, unspecified: Secondary | ICD-10-CM | POA: Diagnosis not present

## 2015-05-18 DIAGNOSIS — D631 Anemia in chronic kidney disease: Secondary | ICD-10-CM | POA: Diagnosis not present

## 2015-05-18 DIAGNOSIS — N2581 Secondary hyperparathyroidism of renal origin: Secondary | ICD-10-CM | POA: Diagnosis not present

## 2015-05-18 DIAGNOSIS — Z23 Encounter for immunization: Secondary | ICD-10-CM | POA: Diagnosis not present

## 2015-05-18 DIAGNOSIS — N186 End stage renal disease: Secondary | ICD-10-CM | POA: Diagnosis not present

## 2015-05-18 DIAGNOSIS — D509 Iron deficiency anemia, unspecified: Secondary | ICD-10-CM | POA: Diagnosis not present

## 2015-05-20 DIAGNOSIS — N186 End stage renal disease: Secondary | ICD-10-CM | POA: Diagnosis not present

## 2015-05-20 DIAGNOSIS — N2581 Secondary hyperparathyroidism of renal origin: Secondary | ICD-10-CM | POA: Diagnosis not present

## 2015-05-20 DIAGNOSIS — E559 Vitamin D deficiency, unspecified: Secondary | ICD-10-CM | POA: Diagnosis not present

## 2015-05-20 DIAGNOSIS — D631 Anemia in chronic kidney disease: Secondary | ICD-10-CM | POA: Diagnosis not present

## 2015-05-20 DIAGNOSIS — Z23 Encounter for immunization: Secondary | ICD-10-CM | POA: Diagnosis not present

## 2015-05-20 DIAGNOSIS — D509 Iron deficiency anemia, unspecified: Secondary | ICD-10-CM | POA: Diagnosis not present

## 2015-05-23 DIAGNOSIS — Z23 Encounter for immunization: Secondary | ICD-10-CM | POA: Diagnosis not present

## 2015-05-23 DIAGNOSIS — D509 Iron deficiency anemia, unspecified: Secondary | ICD-10-CM | POA: Diagnosis not present

## 2015-05-23 DIAGNOSIS — E559 Vitamin D deficiency, unspecified: Secondary | ICD-10-CM | POA: Diagnosis not present

## 2015-05-23 DIAGNOSIS — N186 End stage renal disease: Secondary | ICD-10-CM | POA: Diagnosis not present

## 2015-05-23 DIAGNOSIS — N2581 Secondary hyperparathyroidism of renal origin: Secondary | ICD-10-CM | POA: Diagnosis not present

## 2015-05-23 DIAGNOSIS — D631 Anemia in chronic kidney disease: Secondary | ICD-10-CM | POA: Diagnosis not present

## 2015-05-25 DIAGNOSIS — D509 Iron deficiency anemia, unspecified: Secondary | ICD-10-CM | POA: Diagnosis not present

## 2015-05-25 DIAGNOSIS — N186 End stage renal disease: Secondary | ICD-10-CM | POA: Diagnosis not present

## 2015-05-25 DIAGNOSIS — Z23 Encounter for immunization: Secondary | ICD-10-CM | POA: Diagnosis not present

## 2015-05-25 DIAGNOSIS — E559 Vitamin D deficiency, unspecified: Secondary | ICD-10-CM | POA: Diagnosis not present

## 2015-05-25 DIAGNOSIS — N2581 Secondary hyperparathyroidism of renal origin: Secondary | ICD-10-CM | POA: Diagnosis not present

## 2015-05-25 DIAGNOSIS — D631 Anemia in chronic kidney disease: Secondary | ICD-10-CM | POA: Diagnosis not present

## 2015-05-27 DIAGNOSIS — N2581 Secondary hyperparathyroidism of renal origin: Secondary | ICD-10-CM | POA: Diagnosis not present

## 2015-05-27 DIAGNOSIS — D631 Anemia in chronic kidney disease: Secondary | ICD-10-CM | POA: Diagnosis not present

## 2015-05-27 DIAGNOSIS — Z23 Encounter for immunization: Secondary | ICD-10-CM | POA: Diagnosis not present

## 2015-05-27 DIAGNOSIS — D509 Iron deficiency anemia, unspecified: Secondary | ICD-10-CM | POA: Diagnosis not present

## 2015-05-27 DIAGNOSIS — N186 End stage renal disease: Secondary | ICD-10-CM | POA: Diagnosis not present

## 2015-05-27 DIAGNOSIS — E559 Vitamin D deficiency, unspecified: Secondary | ICD-10-CM | POA: Diagnosis not present

## 2015-05-29 DIAGNOSIS — N186 End stage renal disease: Secondary | ICD-10-CM | POA: Diagnosis not present

## 2015-05-29 DIAGNOSIS — D631 Anemia in chronic kidney disease: Secondary | ICD-10-CM | POA: Diagnosis not present

## 2015-05-29 DIAGNOSIS — N2581 Secondary hyperparathyroidism of renal origin: Secondary | ICD-10-CM | POA: Diagnosis not present

## 2015-05-29 DIAGNOSIS — Z23 Encounter for immunization: Secondary | ICD-10-CM | POA: Diagnosis not present

## 2015-05-29 DIAGNOSIS — D509 Iron deficiency anemia, unspecified: Secondary | ICD-10-CM | POA: Diagnosis not present

## 2015-05-29 DIAGNOSIS — E559 Vitamin D deficiency, unspecified: Secondary | ICD-10-CM | POA: Diagnosis not present

## 2015-05-31 DIAGNOSIS — E559 Vitamin D deficiency, unspecified: Secondary | ICD-10-CM | POA: Diagnosis not present

## 2015-05-31 DIAGNOSIS — N186 End stage renal disease: Secondary | ICD-10-CM | POA: Diagnosis not present

## 2015-05-31 DIAGNOSIS — Z23 Encounter for immunization: Secondary | ICD-10-CM | POA: Diagnosis not present

## 2015-05-31 DIAGNOSIS — D509 Iron deficiency anemia, unspecified: Secondary | ICD-10-CM | POA: Diagnosis not present

## 2015-05-31 DIAGNOSIS — N2581 Secondary hyperparathyroidism of renal origin: Secondary | ICD-10-CM | POA: Diagnosis not present

## 2015-05-31 DIAGNOSIS — D631 Anemia in chronic kidney disease: Secondary | ICD-10-CM | POA: Diagnosis not present

## 2015-06-03 DIAGNOSIS — N186 End stage renal disease: Secondary | ICD-10-CM | POA: Diagnosis not present

## 2015-06-03 DIAGNOSIS — D509 Iron deficiency anemia, unspecified: Secondary | ICD-10-CM | POA: Diagnosis not present

## 2015-06-03 DIAGNOSIS — D631 Anemia in chronic kidney disease: Secondary | ICD-10-CM | POA: Diagnosis not present

## 2015-06-03 DIAGNOSIS — E559 Vitamin D deficiency, unspecified: Secondary | ICD-10-CM | POA: Diagnosis not present

## 2015-06-03 DIAGNOSIS — Z23 Encounter for immunization: Secondary | ICD-10-CM | POA: Diagnosis not present

## 2015-06-03 DIAGNOSIS — N2581 Secondary hyperparathyroidism of renal origin: Secondary | ICD-10-CM | POA: Diagnosis not present

## 2015-06-06 DIAGNOSIS — D631 Anemia in chronic kidney disease: Secondary | ICD-10-CM | POA: Diagnosis not present

## 2015-06-06 DIAGNOSIS — Z23 Encounter for immunization: Secondary | ICD-10-CM | POA: Diagnosis not present

## 2015-06-06 DIAGNOSIS — D509 Iron deficiency anemia, unspecified: Secondary | ICD-10-CM | POA: Diagnosis not present

## 2015-06-06 DIAGNOSIS — N186 End stage renal disease: Secondary | ICD-10-CM | POA: Diagnosis not present

## 2015-06-06 DIAGNOSIS — E559 Vitamin D deficiency, unspecified: Secondary | ICD-10-CM | POA: Diagnosis not present

## 2015-06-06 DIAGNOSIS — N2581 Secondary hyperparathyroidism of renal origin: Secondary | ICD-10-CM | POA: Diagnosis not present

## 2015-06-08 DIAGNOSIS — N2581 Secondary hyperparathyroidism of renal origin: Secondary | ICD-10-CM | POA: Diagnosis not present

## 2015-06-08 DIAGNOSIS — E559 Vitamin D deficiency, unspecified: Secondary | ICD-10-CM | POA: Diagnosis not present

## 2015-06-08 DIAGNOSIS — D509 Iron deficiency anemia, unspecified: Secondary | ICD-10-CM | POA: Diagnosis not present

## 2015-06-08 DIAGNOSIS — Z992 Dependence on renal dialysis: Secondary | ICD-10-CM | POA: Diagnosis not present

## 2015-06-08 DIAGNOSIS — D631 Anemia in chronic kidney disease: Secondary | ICD-10-CM | POA: Diagnosis not present

## 2015-06-08 DIAGNOSIS — N186 End stage renal disease: Secondary | ICD-10-CM | POA: Diagnosis not present

## 2015-06-10 DIAGNOSIS — N186 End stage renal disease: Secondary | ICD-10-CM | POA: Diagnosis not present

## 2015-06-10 DIAGNOSIS — D631 Anemia in chronic kidney disease: Secondary | ICD-10-CM | POA: Diagnosis not present

## 2015-06-10 DIAGNOSIS — E559 Vitamin D deficiency, unspecified: Secondary | ICD-10-CM | POA: Diagnosis not present

## 2015-06-10 DIAGNOSIS — Z992 Dependence on renal dialysis: Secondary | ICD-10-CM | POA: Diagnosis not present

## 2015-06-10 DIAGNOSIS — N2581 Secondary hyperparathyroidism of renal origin: Secondary | ICD-10-CM | POA: Diagnosis not present

## 2015-06-10 DIAGNOSIS — D509 Iron deficiency anemia, unspecified: Secondary | ICD-10-CM | POA: Diagnosis not present

## 2015-06-13 DIAGNOSIS — N2581 Secondary hyperparathyroidism of renal origin: Secondary | ICD-10-CM | POA: Diagnosis not present

## 2015-06-13 DIAGNOSIS — D631 Anemia in chronic kidney disease: Secondary | ICD-10-CM | POA: Diagnosis not present

## 2015-06-13 DIAGNOSIS — N186 End stage renal disease: Secondary | ICD-10-CM | POA: Diagnosis not present

## 2015-06-13 DIAGNOSIS — Z992 Dependence on renal dialysis: Secondary | ICD-10-CM | POA: Diagnosis not present

## 2015-06-13 DIAGNOSIS — D509 Iron deficiency anemia, unspecified: Secondary | ICD-10-CM | POA: Diagnosis not present

## 2015-06-13 DIAGNOSIS — E559 Vitamin D deficiency, unspecified: Secondary | ICD-10-CM | POA: Diagnosis not present

## 2015-06-15 DIAGNOSIS — E559 Vitamin D deficiency, unspecified: Secondary | ICD-10-CM | POA: Diagnosis not present

## 2015-06-15 DIAGNOSIS — Z992 Dependence on renal dialysis: Secondary | ICD-10-CM | POA: Diagnosis not present

## 2015-06-15 DIAGNOSIS — D631 Anemia in chronic kidney disease: Secondary | ICD-10-CM | POA: Diagnosis not present

## 2015-06-15 DIAGNOSIS — N2581 Secondary hyperparathyroidism of renal origin: Secondary | ICD-10-CM | POA: Diagnosis not present

## 2015-06-15 DIAGNOSIS — N186 End stage renal disease: Secondary | ICD-10-CM | POA: Diagnosis not present

## 2015-06-15 DIAGNOSIS — D509 Iron deficiency anemia, unspecified: Secondary | ICD-10-CM | POA: Diagnosis not present

## 2015-06-16 DIAGNOSIS — T82858A Stenosis of vascular prosthetic devices, implants and grafts, initial encounter: Secondary | ICD-10-CM | POA: Diagnosis not present

## 2015-06-16 DIAGNOSIS — N186 End stage renal disease: Secondary | ICD-10-CM | POA: Diagnosis not present

## 2015-06-17 DIAGNOSIS — Z992 Dependence on renal dialysis: Secondary | ICD-10-CM | POA: Diagnosis not present

## 2015-06-17 DIAGNOSIS — E559 Vitamin D deficiency, unspecified: Secondary | ICD-10-CM | POA: Diagnosis not present

## 2015-06-17 DIAGNOSIS — D509 Iron deficiency anemia, unspecified: Secondary | ICD-10-CM | POA: Diagnosis not present

## 2015-06-17 DIAGNOSIS — D631 Anemia in chronic kidney disease: Secondary | ICD-10-CM | POA: Diagnosis not present

## 2015-06-17 DIAGNOSIS — N186 End stage renal disease: Secondary | ICD-10-CM | POA: Diagnosis not present

## 2015-06-17 DIAGNOSIS — N2581 Secondary hyperparathyroidism of renal origin: Secondary | ICD-10-CM | POA: Diagnosis not present

## 2015-06-20 DIAGNOSIS — D509 Iron deficiency anemia, unspecified: Secondary | ICD-10-CM | POA: Diagnosis not present

## 2015-06-20 DIAGNOSIS — E559 Vitamin D deficiency, unspecified: Secondary | ICD-10-CM | POA: Diagnosis not present

## 2015-06-20 DIAGNOSIS — Z992 Dependence on renal dialysis: Secondary | ICD-10-CM | POA: Diagnosis not present

## 2015-06-20 DIAGNOSIS — D631 Anemia in chronic kidney disease: Secondary | ICD-10-CM | POA: Diagnosis not present

## 2015-06-20 DIAGNOSIS — N2581 Secondary hyperparathyroidism of renal origin: Secondary | ICD-10-CM | POA: Diagnosis not present

## 2015-06-20 DIAGNOSIS — N186 End stage renal disease: Secondary | ICD-10-CM | POA: Diagnosis not present

## 2015-06-22 DIAGNOSIS — E559 Vitamin D deficiency, unspecified: Secondary | ICD-10-CM | POA: Diagnosis not present

## 2015-06-22 DIAGNOSIS — Z992 Dependence on renal dialysis: Secondary | ICD-10-CM | POA: Diagnosis not present

## 2015-06-22 DIAGNOSIS — D509 Iron deficiency anemia, unspecified: Secondary | ICD-10-CM | POA: Diagnosis not present

## 2015-06-22 DIAGNOSIS — N2581 Secondary hyperparathyroidism of renal origin: Secondary | ICD-10-CM | POA: Diagnosis not present

## 2015-06-22 DIAGNOSIS — D631 Anemia in chronic kidney disease: Secondary | ICD-10-CM | POA: Diagnosis not present

## 2015-06-22 DIAGNOSIS — N186 End stage renal disease: Secondary | ICD-10-CM | POA: Diagnosis not present

## 2015-06-24 DIAGNOSIS — N2581 Secondary hyperparathyroidism of renal origin: Secondary | ICD-10-CM | POA: Diagnosis not present

## 2015-06-24 DIAGNOSIS — E559 Vitamin D deficiency, unspecified: Secondary | ICD-10-CM | POA: Diagnosis not present

## 2015-06-24 DIAGNOSIS — D509 Iron deficiency anemia, unspecified: Secondary | ICD-10-CM | POA: Diagnosis not present

## 2015-06-24 DIAGNOSIS — N186 End stage renal disease: Secondary | ICD-10-CM | POA: Diagnosis not present

## 2015-06-24 DIAGNOSIS — D631 Anemia in chronic kidney disease: Secondary | ICD-10-CM | POA: Diagnosis not present

## 2015-06-24 DIAGNOSIS — Z992 Dependence on renal dialysis: Secondary | ICD-10-CM | POA: Diagnosis not present

## 2015-06-27 DIAGNOSIS — D631 Anemia in chronic kidney disease: Secondary | ICD-10-CM | POA: Diagnosis not present

## 2015-06-27 DIAGNOSIS — E559 Vitamin D deficiency, unspecified: Secondary | ICD-10-CM | POA: Diagnosis not present

## 2015-06-27 DIAGNOSIS — N2581 Secondary hyperparathyroidism of renal origin: Secondary | ICD-10-CM | POA: Diagnosis not present

## 2015-06-27 DIAGNOSIS — N186 End stage renal disease: Secondary | ICD-10-CM | POA: Diagnosis not present

## 2015-06-27 DIAGNOSIS — Z992 Dependence on renal dialysis: Secondary | ICD-10-CM | POA: Diagnosis not present

## 2015-06-27 DIAGNOSIS — D509 Iron deficiency anemia, unspecified: Secondary | ICD-10-CM | POA: Diagnosis not present

## 2015-06-29 DIAGNOSIS — D509 Iron deficiency anemia, unspecified: Secondary | ICD-10-CM | POA: Diagnosis not present

## 2015-06-29 DIAGNOSIS — D631 Anemia in chronic kidney disease: Secondary | ICD-10-CM | POA: Diagnosis not present

## 2015-06-29 DIAGNOSIS — E559 Vitamin D deficiency, unspecified: Secondary | ICD-10-CM | POA: Diagnosis not present

## 2015-06-29 DIAGNOSIS — N186 End stage renal disease: Secondary | ICD-10-CM | POA: Diagnosis not present

## 2015-06-29 DIAGNOSIS — N2581 Secondary hyperparathyroidism of renal origin: Secondary | ICD-10-CM | POA: Diagnosis not present

## 2015-06-29 DIAGNOSIS — Z992 Dependence on renal dialysis: Secondary | ICD-10-CM | POA: Diagnosis not present

## 2015-06-30 DIAGNOSIS — M329 Systemic lupus erythematosus, unspecified: Secondary | ICD-10-CM | POA: Diagnosis present

## 2015-07-01 DIAGNOSIS — D631 Anemia in chronic kidney disease: Secondary | ICD-10-CM | POA: Diagnosis not present

## 2015-07-01 DIAGNOSIS — N2581 Secondary hyperparathyroidism of renal origin: Secondary | ICD-10-CM | POA: Diagnosis not present

## 2015-07-01 DIAGNOSIS — D509 Iron deficiency anemia, unspecified: Secondary | ICD-10-CM | POA: Diagnosis not present

## 2015-07-01 DIAGNOSIS — E559 Vitamin D deficiency, unspecified: Secondary | ICD-10-CM | POA: Diagnosis not present

## 2015-07-01 DIAGNOSIS — Z992 Dependence on renal dialysis: Secondary | ICD-10-CM | POA: Diagnosis not present

## 2015-07-01 DIAGNOSIS — N186 End stage renal disease: Secondary | ICD-10-CM | POA: Diagnosis not present

## 2015-07-05 DIAGNOSIS — Z992 Dependence on renal dialysis: Secondary | ICD-10-CM | POA: Diagnosis not present

## 2015-07-05 DIAGNOSIS — N2581 Secondary hyperparathyroidism of renal origin: Secondary | ICD-10-CM | POA: Diagnosis not present

## 2015-07-05 DIAGNOSIS — D509 Iron deficiency anemia, unspecified: Secondary | ICD-10-CM | POA: Diagnosis not present

## 2015-07-05 DIAGNOSIS — D631 Anemia in chronic kidney disease: Secondary | ICD-10-CM | POA: Diagnosis not present

## 2015-07-05 DIAGNOSIS — E559 Vitamin D deficiency, unspecified: Secondary | ICD-10-CM | POA: Diagnosis not present

## 2015-07-05 DIAGNOSIS — N186 End stage renal disease: Secondary | ICD-10-CM | POA: Diagnosis not present

## 2015-07-06 DIAGNOSIS — E559 Vitamin D deficiency, unspecified: Secondary | ICD-10-CM | POA: Diagnosis not present

## 2015-07-06 DIAGNOSIS — D631 Anemia in chronic kidney disease: Secondary | ICD-10-CM | POA: Diagnosis not present

## 2015-07-06 DIAGNOSIS — N2581 Secondary hyperparathyroidism of renal origin: Secondary | ICD-10-CM | POA: Diagnosis not present

## 2015-07-06 DIAGNOSIS — D509 Iron deficiency anemia, unspecified: Secondary | ICD-10-CM | POA: Diagnosis not present

## 2015-07-06 DIAGNOSIS — N186 End stage renal disease: Secondary | ICD-10-CM | POA: Diagnosis not present

## 2015-07-06 DIAGNOSIS — Z992 Dependence on renal dialysis: Secondary | ICD-10-CM | POA: Diagnosis not present

## 2015-07-08 DIAGNOSIS — Z992 Dependence on renal dialysis: Secondary | ICD-10-CM | POA: Diagnosis not present

## 2015-07-08 DIAGNOSIS — N186 End stage renal disease: Secondary | ICD-10-CM | POA: Diagnosis not present

## 2015-07-08 DIAGNOSIS — D631 Anemia in chronic kidney disease: Secondary | ICD-10-CM | POA: Diagnosis not present

## 2015-07-08 DIAGNOSIS — E559 Vitamin D deficiency, unspecified: Secondary | ICD-10-CM | POA: Diagnosis not present

## 2015-07-08 DIAGNOSIS — N2581 Secondary hyperparathyroidism of renal origin: Secondary | ICD-10-CM | POA: Diagnosis not present

## 2015-07-08 DIAGNOSIS — D509 Iron deficiency anemia, unspecified: Secondary | ICD-10-CM | POA: Diagnosis not present

## 2015-07-09 DIAGNOSIS — Z992 Dependence on renal dialysis: Secondary | ICD-10-CM | POA: Diagnosis not present

## 2015-07-09 DIAGNOSIS — N186 End stage renal disease: Secondary | ICD-10-CM | POA: Diagnosis not present

## 2015-07-11 DIAGNOSIS — E559 Vitamin D deficiency, unspecified: Secondary | ICD-10-CM | POA: Diagnosis not present

## 2015-07-11 DIAGNOSIS — N2581 Secondary hyperparathyroidism of renal origin: Secondary | ICD-10-CM | POA: Diagnosis not present

## 2015-07-11 DIAGNOSIS — N186 End stage renal disease: Secondary | ICD-10-CM | POA: Diagnosis not present

## 2015-07-11 DIAGNOSIS — D509 Iron deficiency anemia, unspecified: Secondary | ICD-10-CM | POA: Diagnosis not present

## 2015-07-11 DIAGNOSIS — Z992 Dependence on renal dialysis: Secondary | ICD-10-CM | POA: Diagnosis not present

## 2015-07-11 DIAGNOSIS — D631 Anemia in chronic kidney disease: Secondary | ICD-10-CM | POA: Diagnosis not present

## 2015-07-13 DIAGNOSIS — D631 Anemia in chronic kidney disease: Secondary | ICD-10-CM | POA: Diagnosis not present

## 2015-07-13 DIAGNOSIS — E559 Vitamin D deficiency, unspecified: Secondary | ICD-10-CM | POA: Diagnosis not present

## 2015-07-13 DIAGNOSIS — D509 Iron deficiency anemia, unspecified: Secondary | ICD-10-CM | POA: Diagnosis not present

## 2015-07-13 DIAGNOSIS — N186 End stage renal disease: Secondary | ICD-10-CM | POA: Diagnosis not present

## 2015-07-13 DIAGNOSIS — N2581 Secondary hyperparathyroidism of renal origin: Secondary | ICD-10-CM | POA: Diagnosis not present

## 2015-07-13 DIAGNOSIS — Z992 Dependence on renal dialysis: Secondary | ICD-10-CM | POA: Diagnosis not present

## 2015-07-18 DIAGNOSIS — D631 Anemia in chronic kidney disease: Secondary | ICD-10-CM | POA: Diagnosis not present

## 2015-07-18 DIAGNOSIS — N186 End stage renal disease: Secondary | ICD-10-CM | POA: Diagnosis not present

## 2015-07-18 DIAGNOSIS — D509 Iron deficiency anemia, unspecified: Secondary | ICD-10-CM | POA: Diagnosis not present

## 2015-07-18 DIAGNOSIS — E559 Vitamin D deficiency, unspecified: Secondary | ICD-10-CM | POA: Diagnosis not present

## 2015-07-18 DIAGNOSIS — Z992 Dependence on renal dialysis: Secondary | ICD-10-CM | POA: Diagnosis not present

## 2015-07-18 DIAGNOSIS — N2581 Secondary hyperparathyroidism of renal origin: Secondary | ICD-10-CM | POA: Diagnosis not present

## 2015-07-20 DIAGNOSIS — D509 Iron deficiency anemia, unspecified: Secondary | ICD-10-CM | POA: Diagnosis not present

## 2015-07-20 DIAGNOSIS — N186 End stage renal disease: Secondary | ICD-10-CM | POA: Diagnosis not present

## 2015-07-20 DIAGNOSIS — N2581 Secondary hyperparathyroidism of renal origin: Secondary | ICD-10-CM | POA: Diagnosis not present

## 2015-07-20 DIAGNOSIS — E559 Vitamin D deficiency, unspecified: Secondary | ICD-10-CM | POA: Diagnosis not present

## 2015-07-20 DIAGNOSIS — D631 Anemia in chronic kidney disease: Secondary | ICD-10-CM | POA: Diagnosis not present

## 2015-07-20 DIAGNOSIS — Z992 Dependence on renal dialysis: Secondary | ICD-10-CM | POA: Diagnosis not present

## 2015-07-22 DIAGNOSIS — Z992 Dependence on renal dialysis: Secondary | ICD-10-CM | POA: Diagnosis not present

## 2015-07-22 DIAGNOSIS — D509 Iron deficiency anemia, unspecified: Secondary | ICD-10-CM | POA: Diagnosis not present

## 2015-07-22 DIAGNOSIS — E559 Vitamin D deficiency, unspecified: Secondary | ICD-10-CM | POA: Diagnosis not present

## 2015-07-22 DIAGNOSIS — D631 Anemia in chronic kidney disease: Secondary | ICD-10-CM | POA: Diagnosis not present

## 2015-07-22 DIAGNOSIS — N186 End stage renal disease: Secondary | ICD-10-CM | POA: Diagnosis not present

## 2015-07-22 DIAGNOSIS — N2581 Secondary hyperparathyroidism of renal origin: Secondary | ICD-10-CM | POA: Diagnosis not present

## 2015-07-25 DIAGNOSIS — E559 Vitamin D deficiency, unspecified: Secondary | ICD-10-CM | POA: Diagnosis not present

## 2015-07-25 DIAGNOSIS — N2581 Secondary hyperparathyroidism of renal origin: Secondary | ICD-10-CM | POA: Diagnosis not present

## 2015-07-25 DIAGNOSIS — D631 Anemia in chronic kidney disease: Secondary | ICD-10-CM | POA: Diagnosis not present

## 2015-07-25 DIAGNOSIS — D509 Iron deficiency anemia, unspecified: Secondary | ICD-10-CM | POA: Diagnosis not present

## 2015-07-25 DIAGNOSIS — Z992 Dependence on renal dialysis: Secondary | ICD-10-CM | POA: Diagnosis not present

## 2015-07-25 DIAGNOSIS — N186 End stage renal disease: Secondary | ICD-10-CM | POA: Diagnosis not present

## 2015-07-27 DIAGNOSIS — N2581 Secondary hyperparathyroidism of renal origin: Secondary | ICD-10-CM | POA: Diagnosis not present

## 2015-07-27 DIAGNOSIS — E559 Vitamin D deficiency, unspecified: Secondary | ICD-10-CM | POA: Diagnosis not present

## 2015-07-27 DIAGNOSIS — D509 Iron deficiency anemia, unspecified: Secondary | ICD-10-CM | POA: Diagnosis not present

## 2015-07-27 DIAGNOSIS — Z992 Dependence on renal dialysis: Secondary | ICD-10-CM | POA: Diagnosis not present

## 2015-07-27 DIAGNOSIS — D631 Anemia in chronic kidney disease: Secondary | ICD-10-CM | POA: Diagnosis not present

## 2015-07-27 DIAGNOSIS — N186 End stage renal disease: Secondary | ICD-10-CM | POA: Diagnosis not present

## 2015-07-29 DIAGNOSIS — N2581 Secondary hyperparathyroidism of renal origin: Secondary | ICD-10-CM | POA: Diagnosis not present

## 2015-07-29 DIAGNOSIS — N186 End stage renal disease: Secondary | ICD-10-CM | POA: Diagnosis not present

## 2015-07-29 DIAGNOSIS — E559 Vitamin D deficiency, unspecified: Secondary | ICD-10-CM | POA: Diagnosis not present

## 2015-07-29 DIAGNOSIS — D631 Anemia in chronic kidney disease: Secondary | ICD-10-CM | POA: Diagnosis not present

## 2015-07-29 DIAGNOSIS — Z992 Dependence on renal dialysis: Secondary | ICD-10-CM | POA: Diagnosis not present

## 2015-07-29 DIAGNOSIS — D509 Iron deficiency anemia, unspecified: Secondary | ICD-10-CM | POA: Diagnosis not present

## 2015-08-01 DIAGNOSIS — D631 Anemia in chronic kidney disease: Secondary | ICD-10-CM | POA: Diagnosis not present

## 2015-08-01 DIAGNOSIS — E559 Vitamin D deficiency, unspecified: Secondary | ICD-10-CM | POA: Diagnosis not present

## 2015-08-01 DIAGNOSIS — N2581 Secondary hyperparathyroidism of renal origin: Secondary | ICD-10-CM | POA: Diagnosis not present

## 2015-08-01 DIAGNOSIS — Z992 Dependence on renal dialysis: Secondary | ICD-10-CM | POA: Diagnosis not present

## 2015-08-01 DIAGNOSIS — D509 Iron deficiency anemia, unspecified: Secondary | ICD-10-CM | POA: Diagnosis not present

## 2015-08-01 DIAGNOSIS — N186 End stage renal disease: Secondary | ICD-10-CM | POA: Diagnosis not present

## 2015-08-03 DIAGNOSIS — N186 End stage renal disease: Secondary | ICD-10-CM | POA: Diagnosis not present

## 2015-08-03 DIAGNOSIS — D631 Anemia in chronic kidney disease: Secondary | ICD-10-CM | POA: Diagnosis not present

## 2015-08-03 DIAGNOSIS — Z992 Dependence on renal dialysis: Secondary | ICD-10-CM | POA: Diagnosis not present

## 2015-08-03 DIAGNOSIS — N2581 Secondary hyperparathyroidism of renal origin: Secondary | ICD-10-CM | POA: Diagnosis not present

## 2015-08-03 DIAGNOSIS — D509 Iron deficiency anemia, unspecified: Secondary | ICD-10-CM | POA: Diagnosis not present

## 2015-08-03 DIAGNOSIS — E559 Vitamin D deficiency, unspecified: Secondary | ICD-10-CM | POA: Diagnosis not present

## 2015-08-05 DIAGNOSIS — N2581 Secondary hyperparathyroidism of renal origin: Secondary | ICD-10-CM | POA: Diagnosis not present

## 2015-08-05 DIAGNOSIS — D631 Anemia in chronic kidney disease: Secondary | ICD-10-CM | POA: Diagnosis not present

## 2015-08-05 DIAGNOSIS — Z992 Dependence on renal dialysis: Secondary | ICD-10-CM | POA: Diagnosis not present

## 2015-08-05 DIAGNOSIS — E559 Vitamin D deficiency, unspecified: Secondary | ICD-10-CM | POA: Diagnosis not present

## 2015-08-05 DIAGNOSIS — N186 End stage renal disease: Secondary | ICD-10-CM | POA: Diagnosis not present

## 2015-08-05 DIAGNOSIS — D509 Iron deficiency anemia, unspecified: Secondary | ICD-10-CM | POA: Diagnosis not present

## 2015-08-08 DIAGNOSIS — D631 Anemia in chronic kidney disease: Secondary | ICD-10-CM | POA: Diagnosis not present

## 2015-08-08 DIAGNOSIS — N2581 Secondary hyperparathyroidism of renal origin: Secondary | ICD-10-CM | POA: Diagnosis not present

## 2015-08-08 DIAGNOSIS — N186 End stage renal disease: Secondary | ICD-10-CM | POA: Diagnosis not present

## 2015-08-08 DIAGNOSIS — E559 Vitamin D deficiency, unspecified: Secondary | ICD-10-CM | POA: Diagnosis not present

## 2015-08-08 DIAGNOSIS — D509 Iron deficiency anemia, unspecified: Secondary | ICD-10-CM | POA: Diagnosis not present

## 2015-08-08 DIAGNOSIS — Z992 Dependence on renal dialysis: Secondary | ICD-10-CM | POA: Diagnosis not present

## 2015-08-09 DIAGNOSIS — Z992 Dependence on renal dialysis: Secondary | ICD-10-CM | POA: Diagnosis not present

## 2015-08-09 DIAGNOSIS — N186 End stage renal disease: Secondary | ICD-10-CM | POA: Diagnosis not present

## 2015-08-10 DIAGNOSIS — N186 End stage renal disease: Secondary | ICD-10-CM | POA: Diagnosis not present

## 2015-08-10 DIAGNOSIS — N2581 Secondary hyperparathyroidism of renal origin: Secondary | ICD-10-CM | POA: Diagnosis not present

## 2015-08-10 DIAGNOSIS — D631 Anemia in chronic kidney disease: Secondary | ICD-10-CM | POA: Diagnosis not present

## 2015-08-10 DIAGNOSIS — Z992 Dependence on renal dialysis: Secondary | ICD-10-CM | POA: Diagnosis not present

## 2015-08-10 DIAGNOSIS — Z23 Encounter for immunization: Secondary | ICD-10-CM | POA: Diagnosis not present

## 2015-08-12 DIAGNOSIS — Z992 Dependence on renal dialysis: Secondary | ICD-10-CM | POA: Diagnosis not present

## 2015-08-12 DIAGNOSIS — N2581 Secondary hyperparathyroidism of renal origin: Secondary | ICD-10-CM | POA: Diagnosis not present

## 2015-08-12 DIAGNOSIS — N186 End stage renal disease: Secondary | ICD-10-CM | POA: Diagnosis not present

## 2015-08-12 DIAGNOSIS — D631 Anemia in chronic kidney disease: Secondary | ICD-10-CM | POA: Diagnosis not present

## 2015-08-12 DIAGNOSIS — Z23 Encounter for immunization: Secondary | ICD-10-CM | POA: Diagnosis not present

## 2015-08-15 DIAGNOSIS — Z992 Dependence on renal dialysis: Secondary | ICD-10-CM | POA: Diagnosis not present

## 2015-08-15 DIAGNOSIS — Z23 Encounter for immunization: Secondary | ICD-10-CM | POA: Diagnosis not present

## 2015-08-15 DIAGNOSIS — D631 Anemia in chronic kidney disease: Secondary | ICD-10-CM | POA: Diagnosis not present

## 2015-08-15 DIAGNOSIS — N186 End stage renal disease: Secondary | ICD-10-CM | POA: Diagnosis not present

## 2015-08-15 DIAGNOSIS — N2581 Secondary hyperparathyroidism of renal origin: Secondary | ICD-10-CM | POA: Diagnosis not present

## 2015-08-17 DIAGNOSIS — Z992 Dependence on renal dialysis: Secondary | ICD-10-CM | POA: Diagnosis not present

## 2015-08-17 DIAGNOSIS — N186 End stage renal disease: Secondary | ICD-10-CM | POA: Diagnosis not present

## 2015-08-17 DIAGNOSIS — Z23 Encounter for immunization: Secondary | ICD-10-CM | POA: Diagnosis not present

## 2015-08-17 DIAGNOSIS — N2581 Secondary hyperparathyroidism of renal origin: Secondary | ICD-10-CM | POA: Diagnosis not present

## 2015-08-17 DIAGNOSIS — D631 Anemia in chronic kidney disease: Secondary | ICD-10-CM | POA: Diagnosis not present

## 2015-08-19 DIAGNOSIS — N186 End stage renal disease: Secondary | ICD-10-CM | POA: Diagnosis not present

## 2015-08-19 DIAGNOSIS — Z992 Dependence on renal dialysis: Secondary | ICD-10-CM | POA: Diagnosis not present

## 2015-08-19 DIAGNOSIS — N2581 Secondary hyperparathyroidism of renal origin: Secondary | ICD-10-CM | POA: Diagnosis not present

## 2015-08-19 DIAGNOSIS — Z23 Encounter for immunization: Secondary | ICD-10-CM | POA: Diagnosis not present

## 2015-08-19 DIAGNOSIS — D631 Anemia in chronic kidney disease: Secondary | ICD-10-CM | POA: Diagnosis not present

## 2015-08-22 DIAGNOSIS — N186 End stage renal disease: Secondary | ICD-10-CM | POA: Diagnosis not present

## 2015-08-22 DIAGNOSIS — D631 Anemia in chronic kidney disease: Secondary | ICD-10-CM | POA: Diagnosis not present

## 2015-08-22 DIAGNOSIS — Z992 Dependence on renal dialysis: Secondary | ICD-10-CM | POA: Diagnosis not present

## 2015-08-22 DIAGNOSIS — Z23 Encounter for immunization: Secondary | ICD-10-CM | POA: Diagnosis not present

## 2015-08-22 DIAGNOSIS — N2581 Secondary hyperparathyroidism of renal origin: Secondary | ICD-10-CM | POA: Diagnosis not present

## 2015-08-24 DIAGNOSIS — Z992 Dependence on renal dialysis: Secondary | ICD-10-CM | POA: Diagnosis not present

## 2015-08-24 DIAGNOSIS — N2581 Secondary hyperparathyroidism of renal origin: Secondary | ICD-10-CM | POA: Diagnosis not present

## 2015-08-24 DIAGNOSIS — N186 End stage renal disease: Secondary | ICD-10-CM | POA: Diagnosis not present

## 2015-08-24 DIAGNOSIS — D631 Anemia in chronic kidney disease: Secondary | ICD-10-CM | POA: Diagnosis not present

## 2015-08-24 DIAGNOSIS — Z23 Encounter for immunization: Secondary | ICD-10-CM | POA: Diagnosis not present

## 2015-08-26 DIAGNOSIS — Z23 Encounter for immunization: Secondary | ICD-10-CM | POA: Diagnosis not present

## 2015-08-26 DIAGNOSIS — D631 Anemia in chronic kidney disease: Secondary | ICD-10-CM | POA: Diagnosis not present

## 2015-08-26 DIAGNOSIS — Z992 Dependence on renal dialysis: Secondary | ICD-10-CM | POA: Diagnosis not present

## 2015-08-26 DIAGNOSIS — N186 End stage renal disease: Secondary | ICD-10-CM | POA: Diagnosis not present

## 2015-08-26 DIAGNOSIS — N2581 Secondary hyperparathyroidism of renal origin: Secondary | ICD-10-CM | POA: Diagnosis not present

## 2015-08-29 DIAGNOSIS — D631 Anemia in chronic kidney disease: Secondary | ICD-10-CM | POA: Diagnosis not present

## 2015-08-29 DIAGNOSIS — Z992 Dependence on renal dialysis: Secondary | ICD-10-CM | POA: Diagnosis not present

## 2015-08-29 DIAGNOSIS — N186 End stage renal disease: Secondary | ICD-10-CM | POA: Diagnosis not present

## 2015-08-29 DIAGNOSIS — Z23 Encounter for immunization: Secondary | ICD-10-CM | POA: Diagnosis not present

## 2015-08-29 DIAGNOSIS — N2581 Secondary hyperparathyroidism of renal origin: Secondary | ICD-10-CM | POA: Diagnosis not present

## 2015-09-02 DIAGNOSIS — N2581 Secondary hyperparathyroidism of renal origin: Secondary | ICD-10-CM | POA: Diagnosis not present

## 2015-09-02 DIAGNOSIS — D631 Anemia in chronic kidney disease: Secondary | ICD-10-CM | POA: Diagnosis not present

## 2015-09-02 DIAGNOSIS — Z23 Encounter for immunization: Secondary | ICD-10-CM | POA: Diagnosis not present

## 2015-09-02 DIAGNOSIS — Z992 Dependence on renal dialysis: Secondary | ICD-10-CM | POA: Diagnosis not present

## 2015-09-02 DIAGNOSIS — N186 End stage renal disease: Secondary | ICD-10-CM | POA: Diagnosis not present

## 2015-09-06 DIAGNOSIS — Z992 Dependence on renal dialysis: Secondary | ICD-10-CM | POA: Diagnosis not present

## 2015-09-06 DIAGNOSIS — N186 End stage renal disease: Secondary | ICD-10-CM | POA: Diagnosis not present

## 2015-09-07 DIAGNOSIS — D509 Iron deficiency anemia, unspecified: Secondary | ICD-10-CM | POA: Diagnosis not present

## 2015-09-07 DIAGNOSIS — Z992 Dependence on renal dialysis: Secondary | ICD-10-CM | POA: Diagnosis not present

## 2015-09-07 DIAGNOSIS — N186 End stage renal disease: Secondary | ICD-10-CM | POA: Diagnosis not present

## 2015-09-07 DIAGNOSIS — N2581 Secondary hyperparathyroidism of renal origin: Secondary | ICD-10-CM | POA: Diagnosis not present

## 2015-09-07 DIAGNOSIS — E559 Vitamin D deficiency, unspecified: Secondary | ICD-10-CM | POA: Diagnosis not present

## 2015-09-07 DIAGNOSIS — D631 Anemia in chronic kidney disease: Secondary | ICD-10-CM | POA: Diagnosis not present

## 2015-09-09 DIAGNOSIS — N2581 Secondary hyperparathyroidism of renal origin: Secondary | ICD-10-CM | POA: Diagnosis not present

## 2015-09-09 DIAGNOSIS — E559 Vitamin D deficiency, unspecified: Secondary | ICD-10-CM | POA: Diagnosis not present

## 2015-09-09 DIAGNOSIS — D509 Iron deficiency anemia, unspecified: Secondary | ICD-10-CM | POA: Diagnosis not present

## 2015-09-09 DIAGNOSIS — D631 Anemia in chronic kidney disease: Secondary | ICD-10-CM | POA: Diagnosis not present

## 2015-09-09 DIAGNOSIS — Z992 Dependence on renal dialysis: Secondary | ICD-10-CM | POA: Diagnosis not present

## 2015-09-09 DIAGNOSIS — N186 End stage renal disease: Secondary | ICD-10-CM | POA: Diagnosis not present

## 2015-09-12 DIAGNOSIS — N186 End stage renal disease: Secondary | ICD-10-CM | POA: Diagnosis not present

## 2015-09-12 DIAGNOSIS — D509 Iron deficiency anemia, unspecified: Secondary | ICD-10-CM | POA: Diagnosis not present

## 2015-09-12 DIAGNOSIS — E559 Vitamin D deficiency, unspecified: Secondary | ICD-10-CM | POA: Diagnosis not present

## 2015-09-12 DIAGNOSIS — N2581 Secondary hyperparathyroidism of renal origin: Secondary | ICD-10-CM | POA: Diagnosis not present

## 2015-09-12 DIAGNOSIS — D631 Anemia in chronic kidney disease: Secondary | ICD-10-CM | POA: Diagnosis not present

## 2015-09-12 DIAGNOSIS — Z992 Dependence on renal dialysis: Secondary | ICD-10-CM | POA: Diagnosis not present

## 2015-09-14 DIAGNOSIS — E559 Vitamin D deficiency, unspecified: Secondary | ICD-10-CM | POA: Diagnosis not present

## 2015-09-14 DIAGNOSIS — N2581 Secondary hyperparathyroidism of renal origin: Secondary | ICD-10-CM | POA: Diagnosis not present

## 2015-09-14 DIAGNOSIS — N186 End stage renal disease: Secondary | ICD-10-CM | POA: Diagnosis not present

## 2015-09-14 DIAGNOSIS — D509 Iron deficiency anemia, unspecified: Secondary | ICD-10-CM | POA: Diagnosis not present

## 2015-09-14 DIAGNOSIS — D631 Anemia in chronic kidney disease: Secondary | ICD-10-CM | POA: Diagnosis not present

## 2015-09-14 DIAGNOSIS — Z992 Dependence on renal dialysis: Secondary | ICD-10-CM | POA: Diagnosis not present

## 2015-09-16 DIAGNOSIS — E559 Vitamin D deficiency, unspecified: Secondary | ICD-10-CM | POA: Diagnosis not present

## 2015-09-16 DIAGNOSIS — N2581 Secondary hyperparathyroidism of renal origin: Secondary | ICD-10-CM | POA: Diagnosis not present

## 2015-09-16 DIAGNOSIS — N186 End stage renal disease: Secondary | ICD-10-CM | POA: Diagnosis not present

## 2015-09-16 DIAGNOSIS — D509 Iron deficiency anemia, unspecified: Secondary | ICD-10-CM | POA: Diagnosis not present

## 2015-09-16 DIAGNOSIS — D631 Anemia in chronic kidney disease: Secondary | ICD-10-CM | POA: Diagnosis not present

## 2015-09-16 DIAGNOSIS — Z992 Dependence on renal dialysis: Secondary | ICD-10-CM | POA: Diagnosis not present

## 2015-09-18 DIAGNOSIS — N186 End stage renal disease: Secondary | ICD-10-CM | POA: Diagnosis not present

## 2015-09-19 DIAGNOSIS — Z992 Dependence on renal dialysis: Secondary | ICD-10-CM | POA: Diagnosis not present

## 2015-09-19 DIAGNOSIS — D509 Iron deficiency anemia, unspecified: Secondary | ICD-10-CM | POA: Diagnosis not present

## 2015-09-19 DIAGNOSIS — N186 End stage renal disease: Secondary | ICD-10-CM | POA: Diagnosis not present

## 2015-09-19 DIAGNOSIS — E559 Vitamin D deficiency, unspecified: Secondary | ICD-10-CM | POA: Diagnosis not present

## 2015-09-19 DIAGNOSIS — D631 Anemia in chronic kidney disease: Secondary | ICD-10-CM | POA: Diagnosis not present

## 2015-09-19 DIAGNOSIS — N2581 Secondary hyperparathyroidism of renal origin: Secondary | ICD-10-CM | POA: Diagnosis not present

## 2015-09-21 DIAGNOSIS — Z992 Dependence on renal dialysis: Secondary | ICD-10-CM | POA: Diagnosis not present

## 2015-09-21 DIAGNOSIS — N2581 Secondary hyperparathyroidism of renal origin: Secondary | ICD-10-CM | POA: Diagnosis not present

## 2015-09-21 DIAGNOSIS — E559 Vitamin D deficiency, unspecified: Secondary | ICD-10-CM | POA: Diagnosis not present

## 2015-09-21 DIAGNOSIS — N186 End stage renal disease: Secondary | ICD-10-CM | POA: Diagnosis not present

## 2015-09-21 DIAGNOSIS — D631 Anemia in chronic kidney disease: Secondary | ICD-10-CM | POA: Diagnosis not present

## 2015-09-21 DIAGNOSIS — D509 Iron deficiency anemia, unspecified: Secondary | ICD-10-CM | POA: Diagnosis not present

## 2015-09-23 DIAGNOSIS — Z992 Dependence on renal dialysis: Secondary | ICD-10-CM | POA: Diagnosis not present

## 2015-09-23 DIAGNOSIS — D631 Anemia in chronic kidney disease: Secondary | ICD-10-CM | POA: Diagnosis not present

## 2015-09-23 DIAGNOSIS — N2581 Secondary hyperparathyroidism of renal origin: Secondary | ICD-10-CM | POA: Diagnosis not present

## 2015-09-23 DIAGNOSIS — N186 End stage renal disease: Secondary | ICD-10-CM | POA: Diagnosis not present

## 2015-09-23 DIAGNOSIS — E559 Vitamin D deficiency, unspecified: Secondary | ICD-10-CM | POA: Diagnosis not present

## 2015-09-23 DIAGNOSIS — D509 Iron deficiency anemia, unspecified: Secondary | ICD-10-CM | POA: Diagnosis not present

## 2015-09-26 DIAGNOSIS — E559 Vitamin D deficiency, unspecified: Secondary | ICD-10-CM | POA: Diagnosis not present

## 2015-09-26 DIAGNOSIS — D631 Anemia in chronic kidney disease: Secondary | ICD-10-CM | POA: Diagnosis not present

## 2015-09-26 DIAGNOSIS — N2581 Secondary hyperparathyroidism of renal origin: Secondary | ICD-10-CM | POA: Diagnosis not present

## 2015-09-26 DIAGNOSIS — N186 End stage renal disease: Secondary | ICD-10-CM | POA: Diagnosis not present

## 2015-09-26 DIAGNOSIS — Z992 Dependence on renal dialysis: Secondary | ICD-10-CM | POA: Diagnosis not present

## 2015-09-26 DIAGNOSIS — D509 Iron deficiency anemia, unspecified: Secondary | ICD-10-CM | POA: Diagnosis not present

## 2015-09-28 DIAGNOSIS — E559 Vitamin D deficiency, unspecified: Secondary | ICD-10-CM | POA: Diagnosis not present

## 2015-09-28 DIAGNOSIS — N186 End stage renal disease: Secondary | ICD-10-CM | POA: Diagnosis not present

## 2015-09-28 DIAGNOSIS — N2581 Secondary hyperparathyroidism of renal origin: Secondary | ICD-10-CM | POA: Diagnosis not present

## 2015-09-28 DIAGNOSIS — D631 Anemia in chronic kidney disease: Secondary | ICD-10-CM | POA: Diagnosis not present

## 2015-09-28 DIAGNOSIS — D509 Iron deficiency anemia, unspecified: Secondary | ICD-10-CM | POA: Diagnosis not present

## 2015-09-28 DIAGNOSIS — Z992 Dependence on renal dialysis: Secondary | ICD-10-CM | POA: Diagnosis not present

## 2015-09-30 DIAGNOSIS — E559 Vitamin D deficiency, unspecified: Secondary | ICD-10-CM | POA: Diagnosis not present

## 2015-09-30 DIAGNOSIS — N2581 Secondary hyperparathyroidism of renal origin: Secondary | ICD-10-CM | POA: Diagnosis not present

## 2015-09-30 DIAGNOSIS — D509 Iron deficiency anemia, unspecified: Secondary | ICD-10-CM | POA: Diagnosis not present

## 2015-09-30 DIAGNOSIS — N186 End stage renal disease: Secondary | ICD-10-CM | POA: Diagnosis not present

## 2015-09-30 DIAGNOSIS — Z992 Dependence on renal dialysis: Secondary | ICD-10-CM | POA: Diagnosis not present

## 2015-09-30 DIAGNOSIS — D631 Anemia in chronic kidney disease: Secondary | ICD-10-CM | POA: Diagnosis not present

## 2015-10-03 DIAGNOSIS — E559 Vitamin D deficiency, unspecified: Secondary | ICD-10-CM | POA: Diagnosis not present

## 2015-10-03 DIAGNOSIS — N186 End stage renal disease: Secondary | ICD-10-CM | POA: Diagnosis not present

## 2015-10-03 DIAGNOSIS — Z992 Dependence on renal dialysis: Secondary | ICD-10-CM | POA: Diagnosis not present

## 2015-10-03 DIAGNOSIS — D509 Iron deficiency anemia, unspecified: Secondary | ICD-10-CM | POA: Diagnosis not present

## 2015-10-03 DIAGNOSIS — N2581 Secondary hyperparathyroidism of renal origin: Secondary | ICD-10-CM | POA: Diagnosis not present

## 2015-10-03 DIAGNOSIS — D631 Anemia in chronic kidney disease: Secondary | ICD-10-CM | POA: Diagnosis not present

## 2015-10-05 DIAGNOSIS — D631 Anemia in chronic kidney disease: Secondary | ICD-10-CM | POA: Diagnosis not present

## 2015-10-05 DIAGNOSIS — E559 Vitamin D deficiency, unspecified: Secondary | ICD-10-CM | POA: Diagnosis not present

## 2015-10-05 DIAGNOSIS — N2581 Secondary hyperparathyroidism of renal origin: Secondary | ICD-10-CM | POA: Diagnosis not present

## 2015-10-05 DIAGNOSIS — D509 Iron deficiency anemia, unspecified: Secondary | ICD-10-CM | POA: Diagnosis not present

## 2015-10-05 DIAGNOSIS — Z992 Dependence on renal dialysis: Secondary | ICD-10-CM | POA: Diagnosis not present

## 2015-10-05 DIAGNOSIS — N186 End stage renal disease: Secondary | ICD-10-CM | POA: Diagnosis not present

## 2015-10-07 DIAGNOSIS — Z992 Dependence on renal dialysis: Secondary | ICD-10-CM | POA: Diagnosis not present

## 2015-10-07 DIAGNOSIS — D631 Anemia in chronic kidney disease: Secondary | ICD-10-CM | POA: Diagnosis not present

## 2015-10-07 DIAGNOSIS — N186 End stage renal disease: Secondary | ICD-10-CM | POA: Diagnosis not present

## 2015-10-07 DIAGNOSIS — N2581 Secondary hyperparathyroidism of renal origin: Secondary | ICD-10-CM | POA: Diagnosis not present

## 2015-10-10 DIAGNOSIS — N2581 Secondary hyperparathyroidism of renal origin: Secondary | ICD-10-CM | POA: Diagnosis not present

## 2015-10-10 DIAGNOSIS — Z992 Dependence on renal dialysis: Secondary | ICD-10-CM | POA: Diagnosis not present

## 2015-10-10 DIAGNOSIS — D631 Anemia in chronic kidney disease: Secondary | ICD-10-CM | POA: Diagnosis not present

## 2015-10-10 DIAGNOSIS — N186 End stage renal disease: Secondary | ICD-10-CM | POA: Diagnosis not present

## 2015-10-12 DIAGNOSIS — N186 End stage renal disease: Secondary | ICD-10-CM | POA: Diagnosis not present

## 2015-10-12 DIAGNOSIS — N2581 Secondary hyperparathyroidism of renal origin: Secondary | ICD-10-CM | POA: Diagnosis not present

## 2015-10-12 DIAGNOSIS — D631 Anemia in chronic kidney disease: Secondary | ICD-10-CM | POA: Diagnosis not present

## 2015-10-12 DIAGNOSIS — Z992 Dependence on renal dialysis: Secondary | ICD-10-CM | POA: Diagnosis not present

## 2015-10-14 DIAGNOSIS — Z992 Dependence on renal dialysis: Secondary | ICD-10-CM | POA: Diagnosis not present

## 2015-10-14 DIAGNOSIS — N2581 Secondary hyperparathyroidism of renal origin: Secondary | ICD-10-CM | POA: Diagnosis not present

## 2015-10-14 DIAGNOSIS — N186 End stage renal disease: Secondary | ICD-10-CM | POA: Diagnosis not present

## 2015-10-14 DIAGNOSIS — D631 Anemia in chronic kidney disease: Secondary | ICD-10-CM | POA: Diagnosis not present

## 2015-10-17 DIAGNOSIS — N2581 Secondary hyperparathyroidism of renal origin: Secondary | ICD-10-CM | POA: Diagnosis not present

## 2015-10-17 DIAGNOSIS — Z992 Dependence on renal dialysis: Secondary | ICD-10-CM | POA: Diagnosis not present

## 2015-10-17 DIAGNOSIS — N186 End stage renal disease: Secondary | ICD-10-CM | POA: Diagnosis not present

## 2015-10-17 DIAGNOSIS — D631 Anemia in chronic kidney disease: Secondary | ICD-10-CM | POA: Diagnosis not present

## 2015-10-19 DIAGNOSIS — Z992 Dependence on renal dialysis: Secondary | ICD-10-CM | POA: Diagnosis not present

## 2015-10-19 DIAGNOSIS — N2581 Secondary hyperparathyroidism of renal origin: Secondary | ICD-10-CM | POA: Diagnosis not present

## 2015-10-19 DIAGNOSIS — D631 Anemia in chronic kidney disease: Secondary | ICD-10-CM | POA: Diagnosis not present

## 2015-10-19 DIAGNOSIS — N186 End stage renal disease: Secondary | ICD-10-CM | POA: Diagnosis not present

## 2015-10-21 DIAGNOSIS — N186 End stage renal disease: Secondary | ICD-10-CM | POA: Diagnosis not present

## 2015-10-21 DIAGNOSIS — N2581 Secondary hyperparathyroidism of renal origin: Secondary | ICD-10-CM | POA: Diagnosis not present

## 2015-10-21 DIAGNOSIS — D631 Anemia in chronic kidney disease: Secondary | ICD-10-CM | POA: Diagnosis not present

## 2015-10-21 DIAGNOSIS — Z992 Dependence on renal dialysis: Secondary | ICD-10-CM | POA: Diagnosis not present

## 2015-10-24 DIAGNOSIS — N2581 Secondary hyperparathyroidism of renal origin: Secondary | ICD-10-CM | POA: Diagnosis not present

## 2015-10-24 DIAGNOSIS — D631 Anemia in chronic kidney disease: Secondary | ICD-10-CM | POA: Diagnosis not present

## 2015-10-24 DIAGNOSIS — N186 End stage renal disease: Secondary | ICD-10-CM | POA: Diagnosis not present

## 2015-10-24 DIAGNOSIS — Z992 Dependence on renal dialysis: Secondary | ICD-10-CM | POA: Diagnosis not present

## 2015-10-26 DIAGNOSIS — N2581 Secondary hyperparathyroidism of renal origin: Secondary | ICD-10-CM | POA: Diagnosis not present

## 2015-10-26 DIAGNOSIS — D631 Anemia in chronic kidney disease: Secondary | ICD-10-CM | POA: Diagnosis not present

## 2015-10-26 DIAGNOSIS — Z992 Dependence on renal dialysis: Secondary | ICD-10-CM | POA: Diagnosis not present

## 2015-10-26 DIAGNOSIS — N186 End stage renal disease: Secondary | ICD-10-CM | POA: Diagnosis not present

## 2015-10-28 DIAGNOSIS — N2581 Secondary hyperparathyroidism of renal origin: Secondary | ICD-10-CM | POA: Diagnosis not present

## 2015-10-28 DIAGNOSIS — N186 End stage renal disease: Secondary | ICD-10-CM | POA: Diagnosis not present

## 2015-10-28 DIAGNOSIS — Z992 Dependence on renal dialysis: Secondary | ICD-10-CM | POA: Diagnosis not present

## 2015-10-28 DIAGNOSIS — D631 Anemia in chronic kidney disease: Secondary | ICD-10-CM | POA: Diagnosis not present

## 2015-10-31 DIAGNOSIS — N186 End stage renal disease: Secondary | ICD-10-CM | POA: Diagnosis not present

## 2015-10-31 DIAGNOSIS — D631 Anemia in chronic kidney disease: Secondary | ICD-10-CM | POA: Diagnosis not present

## 2015-10-31 DIAGNOSIS — N2581 Secondary hyperparathyroidism of renal origin: Secondary | ICD-10-CM | POA: Diagnosis not present

## 2015-10-31 DIAGNOSIS — Z992 Dependence on renal dialysis: Secondary | ICD-10-CM | POA: Diagnosis not present

## 2015-11-02 DIAGNOSIS — D631 Anemia in chronic kidney disease: Secondary | ICD-10-CM | POA: Diagnosis not present

## 2015-11-02 DIAGNOSIS — N2581 Secondary hyperparathyroidism of renal origin: Secondary | ICD-10-CM | POA: Diagnosis not present

## 2015-11-02 DIAGNOSIS — N186 End stage renal disease: Secondary | ICD-10-CM | POA: Diagnosis not present

## 2015-11-02 DIAGNOSIS — Z992 Dependence on renal dialysis: Secondary | ICD-10-CM | POA: Diagnosis not present

## 2015-11-04 DIAGNOSIS — N2581 Secondary hyperparathyroidism of renal origin: Secondary | ICD-10-CM | POA: Diagnosis not present

## 2015-11-04 DIAGNOSIS — Z992 Dependence on renal dialysis: Secondary | ICD-10-CM | POA: Diagnosis not present

## 2015-11-04 DIAGNOSIS — D631 Anemia in chronic kidney disease: Secondary | ICD-10-CM | POA: Diagnosis not present

## 2015-11-04 DIAGNOSIS — N186 End stage renal disease: Secondary | ICD-10-CM | POA: Diagnosis not present

## 2015-11-07 DIAGNOSIS — D509 Iron deficiency anemia, unspecified: Secondary | ICD-10-CM | POA: Diagnosis not present

## 2015-11-07 DIAGNOSIS — N186 End stage renal disease: Secondary | ICD-10-CM | POA: Diagnosis not present

## 2015-11-07 DIAGNOSIS — Z992 Dependence on renal dialysis: Secondary | ICD-10-CM | POA: Diagnosis not present

## 2015-11-07 DIAGNOSIS — D631 Anemia in chronic kidney disease: Secondary | ICD-10-CM | POA: Diagnosis not present

## 2015-11-07 DIAGNOSIS — N2581 Secondary hyperparathyroidism of renal origin: Secondary | ICD-10-CM | POA: Diagnosis not present

## 2015-11-08 DIAGNOSIS — Z01818 Encounter for other preprocedural examination: Secondary | ICD-10-CM | POA: Diagnosis not present

## 2015-11-08 DIAGNOSIS — R918 Other nonspecific abnormal finding of lung field: Secondary | ICD-10-CM | POA: Diagnosis not present

## 2015-11-08 DIAGNOSIS — I313 Pericardial effusion (noninflammatory): Secondary | ICD-10-CM | POA: Diagnosis not present

## 2015-11-09 DIAGNOSIS — D631 Anemia in chronic kidney disease: Secondary | ICD-10-CM | POA: Diagnosis not present

## 2015-11-09 DIAGNOSIS — N2581 Secondary hyperparathyroidism of renal origin: Secondary | ICD-10-CM | POA: Diagnosis not present

## 2015-11-09 DIAGNOSIS — Z992 Dependence on renal dialysis: Secondary | ICD-10-CM | POA: Diagnosis not present

## 2015-11-09 DIAGNOSIS — N186 End stage renal disease: Secondary | ICD-10-CM | POA: Diagnosis not present

## 2015-11-09 DIAGNOSIS — D509 Iron deficiency anemia, unspecified: Secondary | ICD-10-CM | POA: Diagnosis not present

## 2015-11-10 DIAGNOSIS — N186 End stage renal disease: Secondary | ICD-10-CM | POA: Diagnosis not present

## 2015-11-10 DIAGNOSIS — I10 Essential (primary) hypertension: Secondary | ICD-10-CM | POA: Diagnosis not present

## 2015-11-10 DIAGNOSIS — Y841 Kidney dialysis as the cause of abnormal reaction of the patient, or of later complication, without mention of misadventure at the time of the procedure: Secondary | ICD-10-CM | POA: Diagnosis not present

## 2015-11-10 DIAGNOSIS — T82898A Other specified complication of vascular prosthetic devices, implants and grafts, initial encounter: Secondary | ICD-10-CM | POA: Diagnosis not present

## 2015-11-11 DIAGNOSIS — N186 End stage renal disease: Secondary | ICD-10-CM | POA: Diagnosis not present

## 2015-11-11 DIAGNOSIS — N2581 Secondary hyperparathyroidism of renal origin: Secondary | ICD-10-CM | POA: Diagnosis not present

## 2015-11-11 DIAGNOSIS — Z992 Dependence on renal dialysis: Secondary | ICD-10-CM | POA: Diagnosis not present

## 2015-11-11 DIAGNOSIS — D631 Anemia in chronic kidney disease: Secondary | ICD-10-CM | POA: Diagnosis not present

## 2015-11-11 DIAGNOSIS — D509 Iron deficiency anemia, unspecified: Secondary | ICD-10-CM | POA: Diagnosis not present

## 2015-11-13 ENCOUNTER — Ambulatory Visit
Admission: RE | Admit: 2015-11-13 | Discharge: 2015-11-13 | Disposition: A | Discharge status: Home / Self Care | Payer: Medicare Other | Source: Ambulatory Visit | Attending: Vascular Surgery | Admitting: Vascular Surgery

## 2015-11-13 ENCOUNTER — Other Ambulatory Visit: Payer: Self-pay | Admitting: Vascular Surgery

## 2015-11-13 ENCOUNTER — Encounter
Admission: RE | Disposition: A | Discharge status: Home / Self Care | Payer: Self-pay | Source: Ambulatory Visit | Attending: Vascular Surgery

## 2015-11-13 ENCOUNTER — Encounter: Payer: Self-pay | Admitting: *Deleted

## 2015-11-13 DIAGNOSIS — I499 Cardiac arrhythmia, unspecified: Secondary | ICD-10-CM | POA: Insufficient documentation

## 2015-11-13 DIAGNOSIS — Z992 Dependence on renal dialysis: Secondary | ICD-10-CM | POA: Insufficient documentation

## 2015-11-13 DIAGNOSIS — I120 Hypertensive chronic kidney disease with stage 5 chronic kidney disease or end stage renal disease: Secondary | ICD-10-CM | POA: Insufficient documentation

## 2015-11-13 DIAGNOSIS — I10 Essential (primary) hypertension: Secondary | ICD-10-CM | POA: Diagnosis not present

## 2015-11-13 DIAGNOSIS — T82868A Thrombosis of vascular prosthetic devices, implants and grafts, initial encounter: Secondary | ICD-10-CM | POA: Diagnosis not present

## 2015-11-13 DIAGNOSIS — T82858A Stenosis of vascular prosthetic devices, implants and grafts, initial encounter: Secondary | ICD-10-CM | POA: Diagnosis not present

## 2015-11-13 DIAGNOSIS — M3214 Glomerular disease in systemic lupus erythematosus: Secondary | ICD-10-CM | POA: Diagnosis not present

## 2015-11-13 DIAGNOSIS — Z79899 Other long term (current) drug therapy: Secondary | ICD-10-CM | POA: Diagnosis not present

## 2015-11-13 DIAGNOSIS — Y841 Kidney dialysis as the cause of abnormal reaction of the patient, or of later complication, without mention of misadventure at the time of the procedure: Secondary | ICD-10-CM | POA: Diagnosis not present

## 2015-11-13 DIAGNOSIS — N186 End stage renal disease: Secondary | ICD-10-CM | POA: Insufficient documentation

## 2015-11-13 DIAGNOSIS — Y832 Surgical operation with anastomosis, bypass or graft as the cause of abnormal reaction of the patient, or of later complication, without mention of misadventure at the time of the procedure: Secondary | ICD-10-CM | POA: Insufficient documentation

## 2015-11-13 HISTORY — DX: Chronic kidney disease, unspecified: N18.9

## 2015-11-13 HISTORY — DX: Essential (primary) hypertension: I10

## 2015-11-13 HISTORY — PX: PERIPHERAL VASCULAR CATHETERIZATION: SHX172C

## 2015-11-13 LAB — POTASSIUM (ARMC VASCULAR LAB ONLY): Potassium (ARMC vascular lab): 5.1 (ref 3.5–5.1)

## 2015-11-13 SURGERY — A/V SHUNTOGRAM/FISTULAGRAM
Anesthesia: Moderate Sedation

## 2015-11-13 MED ORDER — HEPARIN SODIUM (PORCINE) 1000 UNIT/ML IJ SOLN
INTRAMUSCULAR | Status: AC
  Filled 2015-11-13: qty 1

## 2015-11-13 MED ORDER — MIDAZOLAM HCL 2 MG/2ML IJ SOLN
INTRAMUSCULAR | Status: AC
  Filled 2015-11-13: qty 2

## 2015-11-13 MED ORDER — MORPHINE SULFATE (PF) 4 MG/ML IV SOLN: 2.0000 mg | INTRAVENOUS | Status: DC | PRN

## 2015-11-13 MED ORDER — IOPAMIDOL (ISOVUE-300) INJECTION 61%
INTRAVENOUS | Status: DC | PRN
  Administered 2015-11-13: 15 mL via INTRAVENOUS

## 2015-11-13 MED ORDER — SODIUM CHLORIDE 0.9 % IV SOLN
INTRAVENOUS | Status: DC
  Administered 2015-11-13: 13:00:00 via INTRAVENOUS

## 2015-11-13 MED ORDER — METHYLPREDNISOLONE SODIUM SUCC 125 MG IJ SOLR
INTRAMUSCULAR | Status: AC
  Filled 2015-11-13: qty 2

## 2015-11-13 MED ORDER — FENTANYL CITRATE (PF) 100 MCG/2ML IJ SOLN
INTRAMUSCULAR | Status: DC | PRN
  Administered 2015-11-13 (×3): 25 ug via INTRAVENOUS
  Administered 2015-11-13: 50 ug via INTRAVENOUS

## 2015-11-13 MED ORDER — PHENOL 1.4 % MT LIQD: 1.0000 | OROMUCOSAL | Status: DC | PRN

## 2015-11-13 MED ORDER — METHYLPREDNISOLONE SODIUM SUCC 125 MG IJ SOLR
125.0000 mg | INTRAMUSCULAR | Status: DC | PRN
  Administered 2015-11-13: 125 mg via INTRAVENOUS

## 2015-11-13 MED ORDER — GUAIFENESIN-DM 100-10 MG/5ML PO SYRP: 15.0000 mL | ORAL_SOLUTION | ORAL | Status: DC | PRN

## 2015-11-13 MED ORDER — HEPARIN (PORCINE) IN NACL 2-0.9 UNIT/ML-% IJ SOLN
INTRAMUSCULAR | Status: AC
  Filled 2015-11-13: qty 1000

## 2015-11-13 MED ORDER — FAMOTIDINE 20 MG PO TABS
40.0000 mg | ORAL_TABLET | ORAL | Status: DC | PRN
  Administered 2015-11-13: 40 mg via ORAL

## 2015-11-13 MED ORDER — MIDAZOLAM HCL 2 MG/2ML IJ SOLN
INTRAMUSCULAR | Status: DC | PRN
Start: 2015-11-13 — End: 2015-11-13
  Administered 2015-11-13: 2 mg via INTRAVENOUS
  Administered 2015-11-13 (×3): 1 mg via INTRAVENOUS

## 2015-11-13 MED ORDER — MIDAZOLAM HCL 5 MG/5ML IJ SOLN
INTRAMUSCULAR | Status: AC
  Filled 2015-11-13: qty 5

## 2015-11-13 MED ORDER — FAMOTIDINE 20 MG PO TABS
ORAL_TABLET | ORAL | Status: AC
  Filled 2015-11-13: qty 1

## 2015-11-13 MED ORDER — ACETAMINOPHEN 325 MG PO TABS
325.0000 mg | ORAL_TABLET | ORAL | Status: DC | PRN
Start: 2015-11-13 — End: 2015-11-13

## 2015-11-13 MED ORDER — FENTANYL CITRATE (PF) 100 MCG/2ML IJ SOLN
INTRAMUSCULAR | Status: AC
  Filled 2015-11-13: qty 2

## 2015-11-13 MED ORDER — CLINDAMYCIN PHOSPHATE 300 MG/50ML IV SOLN
INTRAVENOUS | Status: AC
  Filled 2015-11-13: qty 50

## 2015-11-13 MED ORDER — ONDANSETRON HCL 4 MG/2ML IJ SOLN: 4.0000 mg | Freq: Four times a day (QID) | INTRAMUSCULAR | Status: DC | PRN

## 2015-11-13 MED ORDER — ACETAMINOPHEN 325 MG RE SUPP: 325.0000 mg | RECTAL | Status: DC | PRN

## 2015-11-13 MED ORDER — DEXTROSE 5 % IV SOLN: 1.5000 g | INTRAVENOUS | Status: DC

## 2015-11-13 MED ORDER — LIDOCAINE-EPINEPHRINE (PF) 1 %-1:200000 IJ SOLN
INTRAMUSCULAR | Status: AC
  Filled 2015-11-13: qty 30

## 2015-11-13 MED ORDER — METOPROLOL TARTRATE 5 MG/5ML IV SOLN: 2.0000 mg | INTRAVENOUS | Status: DC | PRN

## 2015-11-13 MED ORDER — ALUM & MAG HYDROXIDE-SIMETH 200-200-20 MG/5ML PO SUSP: 15.0000 mL | ORAL | Status: DC | PRN

## 2015-11-13 MED ORDER — OXYCODONE-ACETAMINOPHEN 5-325 MG PO TABS: 1.0000 | ORAL_TABLET | ORAL | Status: DC | PRN

## 2015-11-13 MED ORDER — HEPARIN SODIUM (PORCINE) 10000 UNIT/ML IJ SOLN
INTRAMUSCULAR | Status: AC
  Filled 2015-11-13: qty 1

## 2015-11-13 MED ORDER — HYDRALAZINE HCL 20 MG/ML IJ SOLN: 5.0000 mg | INTRAMUSCULAR | Status: DC | PRN

## 2015-11-13 MED ORDER — LABETALOL HCL 5 MG/ML IV SOLN: 10.0000 mg | INTRAVENOUS | Status: DC | PRN

## 2015-11-13 MED ORDER — HYDROMORPHONE HCL 1 MG/ML IJ SOLN: 1.0000 mg | Freq: Once | INTRAMUSCULAR | Status: DC

## 2015-11-13 MED ORDER — FAMOTIDINE 20 MG PO TABS
ORAL_TABLET | ORAL | Status: AC
  Administered 2015-11-13: 40 mg via ORAL
  Filled 2015-11-13: qty 1

## 2015-11-13 MED ORDER — SODIUM CHLORIDE 0.9 % IV SOLN: 500.0000 mL | Freq: Once | INTRAVENOUS | Status: DC | PRN

## 2015-11-13 SURGICAL SUPPLY — 7 items
CANNULA 5F STIFF (CANNULA) ×8 IMPLANT
CATH CANNON HEMO 15FR 31CM (HEMODIALYSIS SUPPLIES) ×4 IMPLANT
DRAPE BRACHIAL (DRAPES) ×4 IMPLANT
PACK ANGIOGRAPHY (CUSTOM PROCEDURE TRAY) ×4 IMPLANT
SHEATH BRITE TIP 6FRX5.5 (SHEATH) ×4 IMPLANT
SUT MNCRL AB 4-0 PS2 18 (SUTURE) ×4 IMPLANT
TOWEL OR 17X26 4PK STRL BLUE (TOWEL DISPOSABLE) ×8 IMPLANT

## 2015-11-13 NOTE — Op Note (Signed)
[facility]    OPERATIVE NOTE   PROCEDURE: 1.  Left arm brachial artery to axillary vein arteriovenous graft cannulation under ultrasound guidance 2.  Left arm shuntogram   PRE-OPERATIVE DIAGNOSIS: 1. ESRD 2. Malfunctioning left arm brachial artery to axillary vein arteriovenous graft  POST-OPERATIVE DIAGNOSIS: same as above   SURGEON: Leotis Pain, MD  ANESTHESIA: local with MCS for approximately 30 minutes using 4 mg of Versed and 100 g of fentanyl  ESTIMATED BLOOD LOSS: 25 cc  FINDING(S): 1. 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 female who presents with malfunctioning left brachial artery to axillary vein arteriovenous graft.  The patient is scheduled for  left arm shuntogram.  The patient is aware the risks include but are not limited to: bleeding, infection, thrombosis of the cannulated access, and possible anaphylactic reaction to the contrast.  The patient is aware of the risks of the procedure and elects to proceed forward.  DESCRIPTION: After full informed written consent was obtained, the patient was brought back to the angiography suite and placed supine upon the angiography table.  The patient was connected to monitoring equipment. Moderate conscious sedation was administered during a face to face encounter throughout the procedure with my supervision of the RN administering medicines and monitoring the patient's vital signs, pulse oximetry, telemetry and mental status throughout from the start of the procedure until the patient was taken to the recovery room The  left arm was prepped and draped in the standard fashion for a  percutaneous access intervention.  Under ultrasound guidance, the left brachial artery to axillary vein arteriovenous graft was cannulated with a micropuncture needle under direct ultrasound guidance and a permanent image was performed.  The microwire was advanced into the shunt and the needle was exchanged for the a microsheath.  Imaging was then performed.   Imaging demonstrated no hemodynamically significant stenosis within the left arm brachial artery to axillary vein AV graft or the central venous circulation with patent stents across the venous anastomosis as well as the left innominate vein. 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 Based on the completion imaging, no further intervention is necessary.  The wire and balloon were removed from the sheath.  A 4-0 Monocryl purse-string suture was sewn around the sheath.  The sheath was removed while tying down the suture.  A sterile bandage was applied to the puncture site. A PermCath will be placed and dictated under a separate operative note  COMPLICATIONS: None  CONDITION: Stable   DEW,JASON  11/13/2015 5:19 PM

## 2015-11-13 NOTE — Discharge Instructions (Signed)
Fistulogram, Care After °Refer to this sheet in the next few weeks. These instructions provide you with information on caring for yourself after your procedure. Your health care provider may also give you more specific instructions. Your treatment has been planned according to current medical practices, but problems sometimes occur. Call your health care provider if you have any problems or questions after your procedure. °WHAT TO EXPECT AFTER THE PROCEDURE °After your procedure, it is typical to have the following: °· A small amount of discomfort in the area where the catheters were placed. °· A small amount of bruising around the fistula. °· Sleepiness and fatigue. °HOME CARE INSTRUCTIONS °· Rest at home for the day following your procedure. °· Do not drive or operate heavy machinery while taking pain medicine. °· Take medicines only as directed by your health care provider. °· Do not take baths, swim, or use a hot tub until your health care provider approves. You may shower 24 hours after the procedure or as directed by your health care provider. °· There are many different ways to close and cover an incision, including stitches, skin glue, and adhesive strips. Follow your health care provider's instructions on: °¨ Incision care. °¨ Bandage (dressing) changes and removal. °¨ Incision closure removal. °· Monitor your dialysis fistula carefully. °SEEK MEDICAL CARE IF: °· You have drainage, redness, swelling, or pain at your catheter site. °· You have a fever. °· You have chills. °SEEK IMMEDIATE MEDICAL CARE IF: °· You feel weak. °· You have trouble balancing. °· You have trouble moving your arms or legs. °· You have problems with your speech or vision. °· You can no longer feel a vibration or buzz when you put your fingers over your dialysis fistula. °· The limb that was used for the procedure: °¨ Swells. °¨ Is painful. °¨ Is cold. °¨ Is discolored, such as blue or pale white. °  °This information is not intended  to replace advice given to you by your health care provider. Make sure you discuss any questions you have with your health care provider. °  °Document Released: 11/08/2013 Document Reviewed: 11/08/2013 °Elsevier Interactive Patient Education ©2016 [employer] ° °

## 2015-11-13 NOTE — Op Note (Signed)
OPERATIVE NOTE    PRE-OPERATIVE DIAGNOSIS: 1. ESRD 2. 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 Ultrasound guidance for vascular access to the right femoral  vein 2. Fluoroscopic guidance for placement of catheter 3. Placement of a 31 cm tip to cuff tunneled hemodialysis catheter via the right femoral vein  SURGEON: DEW,JASON, MD  ANESTHESIA:  Local/MCS  ESTIMATED BLOOD LOSS: Minimal  FINDING(S): 1.  Patent right femoral vein.  Duplex showed no patent right jugular vein for PermCath placement.  SPECIMEN(S):  None  INDICATIONS:   Patient is a 50 y.o.female who presents with end-stage renal disease and an aneurysmal left arm AV graft that will require surgical revision.   The patient needs long term dialysis access for their ESRD, and a Permcath is necessary.  Risks and benefits are discussed and informed consent is obtained.    DESCRIPTION: After obtaining full informed written consent, the patient was brought back to the vascular suited. Moderate conscious sedation was administered through a face-to-face encounter with the patient while the RN monitored there vital signs, mental status, oxygen saturations, telemetry throughout the procedure. the patient's right groin was sterilely prepped and draped and a sterile surgical field was created.  The right femoral vein was visualized with ultrasound and found to be patent. It was then accessed under direct ultrasound guidance and a permanent image was recorded. A wire was placed. After skin nick and dilatation, the peel-away sheath was placed over the wire. I then turned my attention to an area about  4 cm inferior and lateral to the access incision and a small counterincision was created.  I tunneled from the counter  incision to the access site. Using fluoroscopic guidance, a  31 centimeterer tip to cuff tunneled hemodialysis catheter was selected, and tunneled from the  counter  incision to the access site. It was then placed through the peel-away sheath and the peel-away sheath was removed. Using fluoroscopic guidance the catheter tips were parked in the  retrohepatic vena cava just below the right atrium. The appropriate distal connectors were placed. It withdrew blood well and flushed easily with heparinized saline and a concentrated heparin solution was then placed. It was secured to the leg  with 2 Prolene sutures. The access incision was closed single 4-0 Monocryl. A 4-0 Monocryl pursestring suture was placed around the exit site. Sterile dressings were placed. The patient tolerated the procedure well and was taken to the recovery room in stable condition.  COMPLICATIONS: None  CONDITION: Stable    DEW,JASON 11/13/2015 5:23 PM

## 2015-11-13 NOTE — H&P (Signed)
  [facility] History & Physical Update  The patient was interviewed and re-examined.  The patient's previous History and Physical has been reviewed and is unchanged.  There is no change in the plan of care. We plan to proceed with the scheduled procedure.  Shuaib Corsino, MD  11/13/2015, 2:49 PM

## 2015-11-14 ENCOUNTER — Encounter: Payer: Self-pay | Admitting: Vascular Surgery

## 2015-11-14 ENCOUNTER — Other Ambulatory Visit: Payer: Self-pay | Admitting: Vascular Surgery

## 2015-11-14 DIAGNOSIS — N186 End stage renal disease: Secondary | ICD-10-CM | POA: Diagnosis not present

## 2015-11-14 DIAGNOSIS — N2581 Secondary hyperparathyroidism of renal origin: Secondary | ICD-10-CM | POA: Diagnosis not present

## 2015-11-14 DIAGNOSIS — D631 Anemia in chronic kidney disease: Secondary | ICD-10-CM | POA: Diagnosis not present

## 2015-11-14 DIAGNOSIS — Z992 Dependence on renal dialysis: Secondary | ICD-10-CM | POA: Diagnosis not present

## 2015-11-14 DIAGNOSIS — D509 Iron deficiency anemia, unspecified: Secondary | ICD-10-CM | POA: Diagnosis not present

## 2015-11-15 ENCOUNTER — Inpatient Hospital Stay: Admission: RE | Admit: 2015-11-15 | Payer: Medicare Other | Source: Ambulatory Visit

## 2015-11-16 DIAGNOSIS — D631 Anemia in chronic kidney disease: Secondary | ICD-10-CM | POA: Diagnosis not present

## 2015-11-16 DIAGNOSIS — D509 Iron deficiency anemia, unspecified: Secondary | ICD-10-CM | POA: Diagnosis not present

## 2015-11-16 DIAGNOSIS — Z992 Dependence on renal dialysis: Secondary | ICD-10-CM | POA: Diagnosis not present

## 2015-11-16 DIAGNOSIS — N2581 Secondary hyperparathyroidism of renal origin: Secondary | ICD-10-CM | POA: Diagnosis not present

## 2015-11-16 DIAGNOSIS — N186 End stage renal disease: Secondary | ICD-10-CM | POA: Diagnosis not present

## 2015-11-16 MED ORDER — FAMOTIDINE 20 MG PO TABS
ORAL_TABLET | ORAL | Status: AC
  Filled 2015-11-16: qty 1

## 2015-11-16 MED ORDER — CEFAZOLIN SODIUM-DEXTROSE 2-4 GM/100ML-% IV SOLN
INTRAVENOUS | Status: AC
  Filled 2015-11-16: qty 100

## 2015-11-18 DIAGNOSIS — D631 Anemia in chronic kidney disease: Secondary | ICD-10-CM | POA: Diagnosis not present

## 2015-11-18 DIAGNOSIS — D509 Iron deficiency anemia, unspecified: Secondary | ICD-10-CM | POA: Diagnosis not present

## 2015-11-18 DIAGNOSIS — Z992 Dependence on renal dialysis: Secondary | ICD-10-CM | POA: Diagnosis not present

## 2015-11-18 DIAGNOSIS — N2581 Secondary hyperparathyroidism of renal origin: Secondary | ICD-10-CM | POA: Diagnosis not present

## 2015-11-18 DIAGNOSIS — N186 End stage renal disease: Secondary | ICD-10-CM | POA: Diagnosis not present

## 2015-11-20 ENCOUNTER — Encounter
Admission: RE | Admit: 2015-11-20 | Discharge: 2015-11-20 | Disposition: A | Discharge status: Home / Self Care | Payer: Medicare Other | Source: Ambulatory Visit | Attending: Vascular Surgery | Admitting: Vascular Surgery

## 2015-11-20 DIAGNOSIS — Z886 Allergy status to analgesic agent status: Secondary | ICD-10-CM | POA: Insufficient documentation

## 2015-11-20 DIAGNOSIS — I499 Cardiac arrhythmia, unspecified: Secondary | ICD-10-CM | POA: Insufficient documentation

## 2015-11-20 DIAGNOSIS — I728 Aneurysm of other specified arteries: Secondary | ICD-10-CM | POA: Diagnosis not present

## 2015-11-20 DIAGNOSIS — T82898A Other specified complication of vascular prosthetic devices, implants and grafts, initial encounter: Secondary | ICD-10-CM | POA: Diagnosis not present

## 2015-11-20 DIAGNOSIS — I739 Peripheral vascular disease, unspecified: Secondary | ICD-10-CM | POA: Diagnosis not present

## 2015-11-20 DIAGNOSIS — X58XXXA Exposure to other specified factors, initial encounter: Secondary | ICD-10-CM | POA: Diagnosis not present

## 2015-11-20 DIAGNOSIS — Z91013 Allergy to seafood: Secondary | ICD-10-CM | POA: Diagnosis not present

## 2015-11-20 DIAGNOSIS — Z800 Family history of malignant neoplasm of digestive organs: Secondary | ICD-10-CM | POA: Diagnosis not present

## 2015-11-20 DIAGNOSIS — Z888 Allergy status to other drugs, medicaments and biological substances status: Secondary | ICD-10-CM | POA: Insufficient documentation

## 2015-11-20 DIAGNOSIS — Q2731 Arteriovenous malformation of vessel of upper limb: Secondary | ICD-10-CM | POA: Insufficient documentation

## 2015-11-20 DIAGNOSIS — Z8489 Family history of other specified conditions: Secondary | ICD-10-CM | POA: Diagnosis not present

## 2015-11-20 DIAGNOSIS — R0602 Shortness of breath: Secondary | ICD-10-CM | POA: Diagnosis not present

## 2015-11-20 DIAGNOSIS — Z992 Dependence on renal dialysis: Secondary | ICD-10-CM | POA: Diagnosis not present

## 2015-11-20 DIAGNOSIS — Z79899 Other long term (current) drug therapy: Secondary | ICD-10-CM | POA: Insufficient documentation

## 2015-11-20 DIAGNOSIS — I120 Hypertensive chronic kidney disease with stage 5 chronic kidney disease or end stage renal disease: Secondary | ICD-10-CM | POA: Diagnosis not present

## 2015-11-20 DIAGNOSIS — N186 End stage renal disease: Secondary | ICD-10-CM | POA: Insufficient documentation

## 2015-11-20 DIAGNOSIS — Z880 Allergy status to penicillin: Secondary | ICD-10-CM | POA: Diagnosis not present

## 2015-11-20 DIAGNOSIS — Z841 Family history of disorders of kidney and ureter: Secondary | ICD-10-CM | POA: Diagnosis not present

## 2015-11-20 DIAGNOSIS — D649 Anemia, unspecified: Secondary | ICD-10-CM | POA: Insufficient documentation

## 2015-11-20 HISTORY — DX: Malignant (primary) neoplasm, unspecified: C80.1

## 2015-11-20 HISTORY — DX: Peripheral vascular disease, unspecified: I73.9

## 2015-11-20 HISTORY — DX: Anemia, unspecified: D64.9

## 2015-11-20 HISTORY — DX: Systemic lupus erythematosus, unspecified: M32.9

## 2015-11-20 HISTORY — DX: Cardiac arrhythmia, unspecified: I49.9

## 2015-11-20 HISTORY — DX: Reserved for concepts with insufficient information to code with codable children: IMO0002

## 2015-11-20 HISTORY — DX: Reserved for inherently not codable concepts without codable children: IMO0001

## 2015-11-20 LAB — BASIC METABOLIC PANEL
ANION GAP: 12 (ref 5–15)
BUN: 54 mg/dL — ABNORMAL HIGH (ref 6–20)
CALCIUM: 10.2 mg/dL (ref 8.9–10.3)
CO2: 23 mmol/L (ref 22–32)
CREATININE: 9.29 mg/dL — AB (ref 0.44–1.00)
Chloride: 106 mmol/L (ref 101–111)
GFR, EST AFRICAN AMERICAN: 5 mL/min — AB (ref >60)
GFR, EST NON AFRICAN AMERICAN: 4 mL/min — AB (ref >60)
Glucose, Bld: 85 mg/dL (ref 65–99)
Potassium: 4.5 mmol/L (ref 3.5–5.1)
SODIUM: 141 mmol/L (ref 135–145)

## 2015-11-20 LAB — CBC WITH DIFFERENTIAL/PLATELET
BASOS ABS: 0 10*3/uL (ref 0–0.1)
BASOS PCT: 1 %
EOS ABS: 0.4 10*3/uL (ref 0–0.7)
Eosinophils Relative: 7 %
HCT: 29.7 % — ABNORMAL LOW (ref 35.0–47.0)
HEMOGLOBIN: 9.3 g/dL — AB (ref 12.0–16.0)
Lymphocytes Relative: 18 %
Lymphs Abs: 1 10*3/uL (ref 1.0–3.6)
MCH: 20.9 pg — ABNORMAL LOW (ref 26.0–34.0)
MCHC: 31.4 g/dL — AB (ref 32.0–36.0)
MCV: 66.7 fL — ABNORMAL LOW (ref 80.0–100.0)
MONOS PCT: 14 %
Monocytes Absolute: 0.8 10*3/uL (ref 0.2–0.9)
NEUTROS PCT: 60 %
Neutro Abs: 3.3 10*3/uL (ref 1.4–6.5)
Platelets: 151 10*3/uL (ref 150–440)
RBC: 4.46 MIL/uL (ref 3.80–5.20)
RDW: 16.3 % — ABNORMAL HIGH (ref 11.5–14.5)
WBC: 5.5 10*3/uL (ref 3.6–11.0)

## 2015-11-20 LAB — TYPE AND SCREEN
ABO/RH(D): O POS
ANTIBODY SCREEN: NEGATIVE

## 2015-11-20 LAB — MRSA PCR SCREENING: MRSA BY PCR: NEGATIVE

## 2015-11-20 LAB — PROTIME-INR
INR: 1.1
Prothrombin Time: 14.4 seconds (ref 11.4–15.0)

## 2015-11-20 LAB — ABO/RH: ABO/RH(D): O POS

## 2015-11-20 LAB — APTT: APTT: 32 s (ref 24–36)

## 2015-11-20 NOTE — Patient Instructions (Signed)
  Your procedure is scheduled on: 11/23/15 Report to Day Surgery. [facility]OND [facility]LO[facility] To find out your arrival time please call [phone] between 1PM - 3PM on 11/22/15.  Remember: Instructions that are not followed completely may result in serious medical risk, up to and including death, or upon the discretion of your surgeon and anesthesiologist your surgery may need to be rescheduled.    _X___ 1. Do not eat food or drink liquids after midnight. No gum chewing or hard candies.     __X__ 2. No Alcohol for 24 hours before or after surgery.   ____ 3. Bring all medications with you on the day of surgery if instructed.    _X___ 4. Notify your doctor if there is any change in your medical condition     (cold, fever, infections).     Do not wear jewelry, make-up, hairpins, clips or nail polish.  Do not wear lotions, powders, or perfumes. You may wear deodorant.  Do not shave 48 hours prior to surgery. Men may shave face and neck.  Do not bring valuables to the hospital.    [facility] is not responsible for any belongings or valuables.               Contacts, dentures or bridgework may not be worn into surgery.  Leave your suitcase in the car. After surgery it may be brought to your room.  For patients admitted to the hospital, discharge time is determined by your                treatment team.   Patients discharged the day of surgery will not be allowed to drive home.   Please read over the following fact sheets that you were given:   Surgical Site Infection Prevention/ MRSA   _X___ Take these medicines the morning of surgery with A SIP OF WATER:    1.AMLODIPINE  2. COREG  3.   4.  5.  6.  ____ Fleet Enema (as directed)   _X___ Use CHG Soap as directed  ____ Use inhalers on the day of surgery  ____ Stop metformin 2 days prior to surgery    ____ Take 1/2 of usual insulin dose the night before surgery and none on the morning of surgery.   ____ Stop  Coumadin/Plavix/aspirin on ____ Stop Anti-inflammatories on   ____ Stop supplements until after surgery.    ____ Bring C-Pap to the hospital.

## 2015-11-21 DIAGNOSIS — D631 Anemia in chronic kidney disease: Secondary | ICD-10-CM | POA: Diagnosis not present

## 2015-11-21 DIAGNOSIS — N186 End stage renal disease: Secondary | ICD-10-CM | POA: Diagnosis not present

## 2015-11-21 DIAGNOSIS — Z992 Dependence on renal dialysis: Secondary | ICD-10-CM | POA: Diagnosis not present

## 2015-11-21 DIAGNOSIS — D509 Iron deficiency anemia, unspecified: Secondary | ICD-10-CM | POA: Diagnosis not present

## 2015-11-21 DIAGNOSIS — N2581 Secondary hyperparathyroidism of renal origin: Secondary | ICD-10-CM | POA: Diagnosis not present

## 2015-11-23 ENCOUNTER — Ambulatory Visit: Payer: Medicare Other | Admitting: Anesthesiology

## 2015-11-23 ENCOUNTER — Ambulatory Visit
Admission: RE | Admit: 2015-11-23 | Discharge: 2015-11-23 | Disposition: A | Discharge status: Home / Self Care | Payer: Medicare Other | Source: Ambulatory Visit | Attending: Vascular Surgery | Admitting: Vascular Surgery

## 2015-11-23 ENCOUNTER — Encounter
Admission: RE | Disposition: A | Discharge status: Home / Self Care | Payer: Self-pay | Source: Ambulatory Visit | Attending: Vascular Surgery

## 2015-11-23 DIAGNOSIS — Z992 Dependence on renal dialysis: Secondary | ICD-10-CM | POA: Diagnosis not present

## 2015-11-23 DIAGNOSIS — Y841 Kidney dialysis as the cause of abnormal reaction of the patient, or of later complication, without mention of misadventure at the time of the procedure: Secondary | ICD-10-CM | POA: Diagnosis not present

## 2015-11-23 DIAGNOSIS — T82898A Other specified complication of vascular prosthetic devices, implants and grafts, initial encounter: Secondary | ICD-10-CM | POA: Diagnosis not present

## 2015-11-23 DIAGNOSIS — N186 End stage renal disease: Secondary | ICD-10-CM | POA: Diagnosis not present

## 2015-11-23 DIAGNOSIS — T82868A Thrombosis of vascular prosthetic devices, implants and grafts, initial encounter: Secondary | ICD-10-CM | POA: Diagnosis not present

## 2015-11-23 DIAGNOSIS — I728 Aneurysm of other specified arteries: Secondary | ICD-10-CM | POA: Diagnosis not present

## 2015-11-23 DIAGNOSIS — I10 Essential (primary) hypertension: Secondary | ICD-10-CM | POA: Diagnosis not present

## 2015-11-23 DIAGNOSIS — I120 Hypertensive chronic kidney disease with stage 5 chronic kidney disease or end stage renal disease: Secondary | ICD-10-CM | POA: Diagnosis not present

## 2015-11-23 DIAGNOSIS — I739 Peripheral vascular disease, unspecified: Secondary | ICD-10-CM | POA: Diagnosis not present

## 2015-11-23 DIAGNOSIS — Q2731 Arteriovenous malformation of vessel of upper limb: Secondary | ICD-10-CM | POA: Diagnosis not present

## 2015-11-23 DIAGNOSIS — D649 Anemia, unspecified: Secondary | ICD-10-CM | POA: Diagnosis not present

## 2015-11-23 HISTORY — PX: REVISION OF ARTERIOVENOUS GORETEX GRAFT: SHX6073

## 2015-11-23 LAB — POCT I-STAT 4, (NA,K, GLUC, HGB,HCT)
GLUCOSE: 74 mg/dL (ref 65–99)
HEMATOCRIT: 32 % — AB (ref 36.0–46.0)
HEMOGLOBIN: 10.9 g/dL — AB (ref 12.0–15.0)
POTASSIUM: 4.4 mmol/L (ref 3.5–5.1)
Sodium: 140 mmol/L (ref 135–145)

## 2015-11-23 SURGERY — REVISION OF ARTERIOVENOUS GORETEX GRAFT
Anesthesia: General | Site: Arm Upper | Laterality: Left | Wound class: Clean

## 2015-11-23 MED ORDER — FENTANYL CITRATE (PF) 100 MCG/2ML IJ SOLN
INTRAMUSCULAR | Status: AC
  Administered 2015-11-23: 25 ug via INTRAVENOUS
  Filled 2015-11-23: qty 2

## 2015-11-23 MED ORDER — CLINDAMYCIN PHOSPHATE 300 MG/50ML IV SOLN
300.0000 mg | Freq: Once | INTRAVENOUS | Status: AC
  Administered 2015-11-23: 300 mg via INTRAVENOUS

## 2015-11-23 MED ORDER — SODIUM CHLORIDE 0.9 % IV SOLN
INTRAVENOUS | Status: DC
  Administered 2015-11-23: 12:00:00 via INTRAVENOUS

## 2015-11-23 MED ORDER — BUPIVACAINE-EPINEPHRINE (PF) 0.5% -1:200000 IJ SOLN
INTRAMUSCULAR | Status: AC
  Filled 2015-11-23: qty 30

## 2015-11-23 MED ORDER — HEPARIN SODIUM (PORCINE) 5000 UNIT/ML IJ SOLN
INTRAMUSCULAR | Status: AC
  Filled 2015-11-23: qty 1

## 2015-11-23 MED ORDER — CLINDAMYCIN PHOSPHATE 300 MG/50ML IV SOLN
INTRAVENOUS | Status: AC
  Administered 2015-11-23: 300 mg via INTRAVENOUS
  Filled 2015-11-23: qty 50

## 2015-11-23 MED ORDER — SODIUM CHLORIDE 0.9 % IJ SOLN
INTRAMUSCULAR | Status: AC
  Filled 2015-11-23: qty 20

## 2015-11-23 MED ORDER — FENTANYL CITRATE (PF) 100 MCG/2ML IJ SOLN
INTRAMUSCULAR | Status: DC | PRN
  Administered 2015-11-23: 25 ug via INTRAVENOUS
  Administered 2015-11-23: 50 ug via INTRAVENOUS
  Administered 2015-11-23: 25 ug via INTRAVENOUS

## 2015-11-23 MED ORDER — HEPARIN SODIUM (PORCINE) 1000 UNIT/ML IJ SOLN
INTRAMUSCULAR | Status: DC | PRN
  Administered 2015-11-23: 3000 [IU] via INTRAVENOUS

## 2015-11-23 MED ORDER — ONDANSETRON HCL 4 MG/2ML IJ SOLN
INTRAMUSCULAR | Status: DC | PRN
  Administered 2015-11-23: 4 mg via INTRAVENOUS

## 2015-11-23 MED ORDER — MIDAZOLAM HCL 2 MG/2ML IJ SOLN
INTRAMUSCULAR | Status: DC | PRN
  Administered 2015-11-23: 1 mg via INTRAVENOUS

## 2015-11-23 MED ORDER — PHENYLEPHRINE HCL 10 MG/ML IJ SOLN
INTRAMUSCULAR | Status: DC | PRN
Start: 2015-11-23 — End: 2015-11-23
  Administered 2015-11-23 (×2): 100 ug via INTRAVENOUS

## 2015-11-23 MED ORDER — SODIUM CHLORIDE 0.9 % IJ SOLN
INTRAMUSCULAR | Status: AC
  Filled 2015-11-23: qty 100

## 2015-11-23 MED ORDER — PAPAVERINE HCL 30 MG/ML IJ SOLN
INTRAMUSCULAR | Status: AC
  Filled 2015-11-23: qty 2

## 2015-11-23 MED ORDER — HEPARIN SODIUM (PORCINE) 10000 UNIT/ML IJ SOLN
INTRAMUSCULAR | Status: AC
  Filled 2015-11-23: qty 1

## 2015-11-23 MED ORDER — LIDOCAINE HCL (CARDIAC) 20 MG/ML IV SOLN
INTRAVENOUS | Status: DC | PRN
  Administered 2015-11-23: 50 mg via INTRAVENOUS

## 2015-11-23 MED ORDER — HEPARIN SODIUM (PORCINE) 1000 UNIT/ML IJ SOLN
INTRAMUSCULAR | Status: DC | PRN
  Administered 2015-11-23: 5000 [IU] via INTRAVENOUS

## 2015-11-23 MED ORDER — FAMOTIDINE 20 MG PO TABS
ORAL_TABLET | ORAL | Status: AC
  Administered 2015-11-23: 20 mg via ORAL
  Filled 2015-11-23: qty 1

## 2015-11-23 MED ORDER — FAMOTIDINE 20 MG PO TABS
20.0000 mg | ORAL_TABLET | Freq: Once | ORAL | Status: AC
  Administered 2015-11-23: 20 mg via ORAL

## 2015-11-23 MED ORDER — ONDANSETRON HCL 4 MG/2ML IJ SOLN: 4.0000 mg | Freq: Once | INTRAMUSCULAR | Status: DC | PRN

## 2015-11-23 MED ORDER — LACTATED RINGERS IV SOLN
INTRAVENOUS | Status: DC | PRN
  Administered 2015-11-23: 13:00:00 via INTRAVENOUS

## 2015-11-23 MED ORDER — FENTANYL CITRATE (PF) 100 MCG/2ML IJ SOLN
25.0000 ug | INTRAMUSCULAR | Status: DC | PRN
  Administered 2015-11-23 (×2): 25 ug via INTRAVENOUS

## 2015-11-23 MED ORDER — HYDROCODONE-ACETAMINOPHEN 5-325 MG PO TABS: 1.0000 | ORAL_TABLET | Freq: Four times a day (QID) | ORAL | Status: DC | PRN

## 2015-11-23 MED ORDER — PROPOFOL 10 MG/ML IV BOLUS
INTRAVENOUS | Status: DC | PRN
  Administered 2015-11-23: 100 mg via INTRAVENOUS
  Administered 2015-11-23: 30 mg via INTRAVENOUS

## 2015-11-23 SURGICAL SUPPLY — 51 items
BAG DECANTER FOR FLEXI CONT (MISCELLANEOUS) ×3 IMPLANT
BLADE SURG SZ11 CARB STEEL (BLADE) ×3 IMPLANT
BOOT SUTURE AID YELLOW STND (SUTURE) ×3 IMPLANT
BRUSH SCRUB 4% CHG (MISCELLANEOUS) ×3 IMPLANT
CANISTER SUCT 1200ML W/VALVE (MISCELLANEOUS) ×3 IMPLANT
CHLORAPREP W/TINT 26ML (MISCELLANEOUS) ×3 IMPLANT
CLIP SPRNG 6MM S-JAW DBL (CLIP) ×3
ELECT CAUTERY BLADE 6.4 (BLADE) ×3 IMPLANT
ELECT REM PT RETURN 9FT ADLT (ELECTROSURGICAL) ×3
ELECTRODE REM PT RTRN 9FT ADLT (ELECTROSURGICAL) ×1 IMPLANT
EVICEL 5ML SEALNT HUMAN B (Miscellaneous) ×3 IMPLANT
GEL ULTRASOUND 20GR AQUASONIC (MISCELLANEOUS) IMPLANT
GLOVE BIO SURGEON STRL SZ7 (GLOVE) ×6 IMPLANT
GLOVE INDICATOR 7.5 STRL GRN (GLOVE) ×3 IMPLANT
GOWN STRL REUS W/ TWL LRG LVL3 (GOWN DISPOSABLE) ×2 IMPLANT
GOWN STRL REUS W/ TWL XL LVL3 (GOWN DISPOSABLE) ×1 IMPLANT
GOWN STRL REUS W/TWL LRG LVL3 (GOWN DISPOSABLE) ×4
GOWN STRL REUS W/TWL XL LVL3 (GOWN DISPOSABLE) ×2
GRAFT COLLAGEN VASCULAR 8X40 (Vascular Products) ×3 IMPLANT
HEMOSTAT SURGICEL 2X3 (HEMOSTASIS) ×6 IMPLANT
IV NS 500ML (IV SOLUTION) ×2
IV NS 500ML BAXH (IV SOLUTION) ×1 IMPLANT
KIT RM TURNOVER STRD PROC AR (KITS) ×3 IMPLANT
LABEL OR SOLS (LABEL) IMPLANT
LIQUID BAND (GAUZE/BANDAGES/DRESSINGS) ×3 IMPLANT
LOOP RED MAXI  1X406MM (MISCELLANEOUS) ×2
LOOP VESSEL MAXI 1X406 RED (MISCELLANEOUS) ×1 IMPLANT
LOOP VESSEL MINI 0.8X406 BLUE (MISCELLANEOUS) ×1 IMPLANT
LOOPS BLUE MINI 0.8X406MM (MISCELLANEOUS) ×2
NEEDLE FILTER BLUNT 18X 1/2SAF (NEEDLE) ×2
NEEDLE FILTER BLUNT 18X1 1/2 (NEEDLE) ×1 IMPLANT
NEEDLE HYPO 30X.5 LL (NEEDLE) IMPLANT
NS IRRIG 500ML POUR BTL (IV SOLUTION) ×3 IMPLANT
PACK EXTREMITY ARMC (MISCELLANEOUS) ×3 IMPLANT
PAD PREP 24X41 OB/GYN DISP (PERSONAL CARE ITEMS) ×3 IMPLANT
SOLUTION CELL SAVER (CLIP) ×1 IMPLANT
STOCKINETTE STRL 4IN 9604848 (GAUZE/BANDAGES/DRESSINGS) ×3 IMPLANT
SUT MNCRL AB 4-0 PS2 18 (SUTURE) ×3 IMPLANT
SUT PROLENE 6 0 BV (SUTURE) ×3 IMPLANT
SUT SILK 2 0 (SUTURE) ×2
SUT SILK 2-0 18XBRD TIE 12 (SUTURE) ×1 IMPLANT
SUT SILK 3 0 (SUTURE)
SUT SILK 3-0 18XBRD TIE 12 (SUTURE) IMPLANT
SUT SILK 4 0 (SUTURE) ×2
SUT SILK 4-0 18XBRD TIE 12 (SUTURE) ×1 IMPLANT
SUT VIC AB 3-0 SH 27 (SUTURE) ×4
SUT VIC AB 3-0 SH 27X BRD (SUTURE) ×2 IMPLANT
SYR 20CC LL (SYRINGE) ×3 IMPLANT
SYR 3ML LL SCALE MARK (SYRINGE) ×3 IMPLANT
SYR TB 1ML 27GX1/2 LL (SYRINGE) IMPLANT
TOWEL OR 17X26 4PK STRL BLUE (TOWEL DISPOSABLE) IMPLANT

## 2015-11-23 NOTE — Anesthesia Procedure Notes (Signed)
Procedure Name: LMA Insertion Date/Time: 11/23/2015 1:44 PM Performed by: [facility] Pre-anesthesia Checklist: Patient identified, Emergency Drugs available, Suction available and Patient being monitored Patient Re-evaluated:Patient Re-evaluated prior to inductionOxygen Delivery Method: Circle system utilized Preoxygenation: Pre-oxygenation with 100% oxygen Intubation Type: IV induction Ventilation: Mask ventilation without difficulty LMA: LMA inserted LMA Size: 3.5 Number of attempts: 1 Placement Confirmation: positive ETCO2 and breath sounds checked- equal and bilateral

## 2015-11-23 NOTE — OR Nursing (Signed)
Sacral pad applied

## 2015-11-23 NOTE — Op Note (Signed)
[facility]   OPERATIVE NOTE   PROCEDURE: 1. Jump graft revision of left upper arm arteriovenous AV graft with new 7 mm Artegraft based on the initial portion of the old graft and a new venous anastomosis to the axillary vein   2. Ligation of aneurysmal left brachial artery to axillary vein AV graft  PRE-OPERATIVE DIAGNOSIS: 1. aneurysmal degeneration of arteriovenous graft  2. 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   palpable thrill at end of the case  SPECIMEN(S):  None  INDICATIONS:   Robin Sanchez is a 50 y.o. 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  In order to salvage the  graft and decrease the bleeding complication risks, I recommended a jump graft revision with ligation of the access.  Risk, benefits, and alternatives to access surgery were discussed.  The patient is aware the risks include but are not limited to: bleeding, infection, steal syndrome, nerve damage, ischemic monomelic neuropathy, loss of the access, need for additional procedures, death and stroke.  The patient agrees to proceed forward with the procedure.  DESCRIPTION: After obtaining full informed written consent, the patient was brought back to the operating room and placed supine upon the operating table.  The patient received IV antibiotics prior to induction.  After obtaining adequate anesthesia, the patient was prepped and draped in the standard fashion for the access procedure.  As incision was created near the arterial anastomosis prior to the aneurysmal segment.  The access was encircled with vessel loops and prepared for control.  I then created an incision in the proximal arm beyond the aneurysmal segment and beyond the previously placed stents the venous anastomosis well up to the  axillary vein and encircled the access there for control with a vessel loop.  the venous dissection to the axillary vein was tedious and the termination of the previous stents were identified and the new anastomosis was placed just proximal to this in the axillary vein beyond the stents.  I then used the tunneller and tunnelled between the two incisions around the old access.  I brought a 7 mm Artegraft  through the tunneller making sure to avoid twisting after marking for orientation.  The patient was then given 3000  units of intravenous heparin.  The access was then controlled and clamped and ligated distally with a silk suture ligature.  I prepared the end nearer the original arterial anastomosis for an anastomosis with the new Artegraft.  The anastomosis was created in an end to end fashion with two CV 6 sutures in the typical fashion.  I then flushed through the new graft and prepared this for the distal anastomosis.  The access was then divided and ligated again with a silk suture ligature.  The distal end was then prepared for anastomosis with the new graft.  a partial occlusion clamp was used to control the axillary vein and the previous stents in the axillary vein and venous anastomosis were triply ligated with 3 suture ligatures with 0 silk. An anastomosis was then created with two 6-0 Prolene sutures in the usual fashion to the axillary vein just beyond the previous stents.  The graft was flushed and de-aired prior to release of control.  Patch sutures with 6-0 Prolene sutures were used as needed for control of bleeding.  Surgicel and Evicel topical hemostatic agents were placed and hemostasis was complete.  I then closed the wound with 3-0 Vicryl suture in the subcutaneous space and a 4-0 Monocryl suture was used to close the skin.  Dermabond was placed as a dressing.  The patient was then awakened from anesthesia and taken to the recovery room in stable condition having tolerated the procedure well.     COMPLICATIONS: none  CONDITION: stable  Robin Sanchez  11/23/2015, 4:02 PM

## 2015-11-23 NOTE — H&P (Signed)
  [facility] History & Physical Update  The patient was interviewed and re-examined.  The patient's previous History and Physical has been reviewed and is unchanged.  There is no change in the plan of care. We plan to proceed with the scheduled procedure.  Jesse Nosbisch, MD  11/23/2015, 11:53 AM

## 2015-11-23 NOTE — Anesthesia Postprocedure Evaluation (Signed)
Anesthesia Post Note  Patient: Robin Sanchez  Procedure(s) Performed: Procedure(s) (LRB): REVISION OF ARTERIOVENOUS GORETEX GRAFT (Left)  Patient location during evaluation: PACU Anesthesia Type: General Level of consciousness: awake Pain management: pain level controlled Vital Signs Assessment: post-procedure vital signs reviewed and stable Respiratory status: spontaneous breathing Cardiovascular status: blood pressure returned to baseline Anesthetic complications: no    Last Vitals:  Filed Vitals:   11/23/15 1620 11/23/15 1625  BP:    Pulse: 70 68  Temp:    Resp: 13 14    Last Pain: There were no vitals filed for this visit.               VAN STAVEREN,Tellis Spivak

## 2015-11-23 NOTE — Anesthesia Preprocedure Evaluation (Signed)
Anesthesia Evaluation  Patient identified by MRN, date of birth, ID band Patient awake    Reviewed: Allergy & Precautions, NPO status , Patient's Chart, lab work & pertinent test results  Airway Mallampati: II  TM Distance: >3 FB     Dental  (+) Chipped   Pulmonary shortness of breath and with exertion,    Pulmonary exam normal        Cardiovascular hypertension, Pt. on medications + Peripheral Vascular Disease  Normal cardiovascular exam+ dysrhythmias      Neuro/Psych negative neurological ROS  negative psych ROS   GI/Hepatic negative GI ROS, Neg liver ROS,   Endo/Other  negative endocrine ROS  Renal/GU CRF and DialysisRenal disease     Musculoskeletal negative musculoskeletal ROS (+)   Abdominal Normal abdominal exam  (+)   Peds  Hematology  (+) anemia ,   Anesthesia Other Findings Lupus Cervical CA  Reproductive/Obstetrics                             Anesthesia Physical Anesthesia Plan  ASA: III  Anesthesia Plan: General   Post-op Pain Management:    Induction: Intravenous  Airway Management Planned: LMA  Additional Equipment:   Intra-op Plan:   Post-operative Plan: Extubation in OR  Informed Consent: I have reviewed the patients History and Physical, chart, labs and discussed the procedure including the risks, benefits and alternatives for the proposed anesthesia with the patient or authorized representative who has indicated his/her understanding and acceptance.   Dental advisory given  Plan Discussed with: CRNA and Surgeon  Anesthesia Plan Comments:         Anesthesia Quick Evaluation

## 2015-11-23 NOTE — Transfer of Care (Signed)
Immediate Anesthesia Transfer of Care Note  Patient: Robin Sanchez  Procedure(s) Performed: Procedure(s): REVISION OF ARTERIOVENOUS GORETEX GRAFT (Left)  Patient Location: PACU  Anesthesia Type:General  Level of Consciousness: sedated  Airway & Oxygen Therapy: Patient Spontanous Breathing and Patient connected to face mask oxygen  Post-op Assessment: Report given to RN and Post -op Vital signs reviewed and stable  Post vital signs: Reviewed and stable  Last Vitals:  Filed Vitals:   11/23/15 1127  BP: 141/82  Pulse: 86  Temp: 36.8 C  Resp: 16    Last Pain: There were no vitals filed for this visit.       Complications: No apparent anesthesia complications

## 2015-11-23 NOTE — Discharge Instructions (Signed)

## 2015-11-24 ENCOUNTER — Encounter: Payer: Self-pay | Admitting: Vascular Surgery

## 2015-11-28 DIAGNOSIS — D509 Iron deficiency anemia, unspecified: Secondary | ICD-10-CM | POA: Diagnosis not present

## 2015-11-28 DIAGNOSIS — D631 Anemia in chronic kidney disease: Secondary | ICD-10-CM | POA: Diagnosis not present

## 2015-11-28 DIAGNOSIS — N2581 Secondary hyperparathyroidism of renal origin: Secondary | ICD-10-CM | POA: Diagnosis not present

## 2015-11-28 DIAGNOSIS — Z992 Dependence on renal dialysis: Secondary | ICD-10-CM | POA: Diagnosis not present

## 2015-11-28 DIAGNOSIS — N186 End stage renal disease: Secondary | ICD-10-CM | POA: Diagnosis not present

## 2015-11-29 ENCOUNTER — Emergency Department
Admission: EM | Admit: 2015-11-29 | Discharge: 2015-11-30 | Disposition: A | Discharge status: Home / Self Care | Payer: Medicare Other | Attending: Emergency Medicine | Admitting: Emergency Medicine

## 2015-11-29 ENCOUNTER — Encounter: Payer: Self-pay | Admitting: Vascular Surgery

## 2015-11-29 DIAGNOSIS — Z992 Dependence on renal dialysis: Secondary | ICD-10-CM | POA: Insufficient documentation

## 2015-11-29 DIAGNOSIS — Z8541 Personal history of malignant neoplasm of cervix uteri: Secondary | ICD-10-CM | POA: Insufficient documentation

## 2015-11-29 DIAGNOSIS — D509 Iron deficiency anemia, unspecified: Secondary | ICD-10-CM | POA: Diagnosis not present

## 2015-11-29 DIAGNOSIS — T8249XA Other complication of vascular dialysis catheter, initial encounter: Secondary | ICD-10-CM

## 2015-11-29 DIAGNOSIS — I729 Aneurysm of unspecified site: Secondary | ICD-10-CM

## 2015-11-29 DIAGNOSIS — Z79899 Other long term (current) drug therapy: Secondary | ICD-10-CM | POA: Diagnosis not present

## 2015-11-29 DIAGNOSIS — I120 Hypertensive chronic kidney disease with stage 5 chronic kidney disease or end stage renal disease: Secondary | ICD-10-CM | POA: Insufficient documentation

## 2015-11-29 DIAGNOSIS — N186 End stage renal disease: Secondary | ICD-10-CM | POA: Insufficient documentation

## 2015-11-29 DIAGNOSIS — Z8679 Personal history of other diseases of the circulatory system: Secondary | ICD-10-CM | POA: Diagnosis not present

## 2015-11-29 DIAGNOSIS — N2581 Secondary hyperparathyroidism of renal origin: Secondary | ICD-10-CM | POA: Diagnosis not present

## 2015-11-29 DIAGNOSIS — D631 Anemia in chronic kidney disease: Secondary | ICD-10-CM | POA: Diagnosis not present

## 2015-11-29 DIAGNOSIS — R1030 Lower abdominal pain, unspecified: Secondary | ICD-10-CM | POA: Diagnosis not present

## 2015-11-29 DIAGNOSIS — Y69 Unspecified misadventure during surgical and medical care: Secondary | ICD-10-CM | POA: Insufficient documentation

## 2015-11-29 DIAGNOSIS — T82868A Thrombosis of vascular prosthetic devices, implants and grafts, initial encounter: Secondary | ICD-10-CM | POA: Diagnosis not present

## 2015-11-29 DIAGNOSIS — T8241XA Breakdown (mechanical) of vascular dialysis catheter, initial encounter: Secondary | ICD-10-CM | POA: Insufficient documentation

## 2015-11-29 DIAGNOSIS — T82898A Other specified complication of vascular prosthetic devices, implants and grafts, initial encounter: Secondary | ICD-10-CM | POA: Diagnosis not present

## 2015-11-29 NOTE — ED Notes (Signed)
MD drew back and flushed one 80mL syringe on each port to verify absence of clotting.  No issues noted.

## 2015-11-29 NOTE — ED Notes (Signed)
Patient transported to Ultrasound 

## 2015-11-29 NOTE — ED Notes (Signed)
This RN used (2) 4x4 sterile bandages and made a pouch to contain both ports.  Pouch enclosed with clear tape and both ports are secure in the sterile pouch.

## 2015-11-29 NOTE — ED Notes (Signed)
MD at bedside. 

## 2015-11-29 NOTE — ED Notes (Signed)
Pt to triage via w/c with no distress noted; reports dialysis cath change today and used for dialysis; now with pain & bleeding to site

## 2015-11-30 ENCOUNTER — Emergency Department: Payer: Medicare Other

## 2015-11-30 DIAGNOSIS — D509 Iron deficiency anemia, unspecified: Secondary | ICD-10-CM | POA: Diagnosis not present

## 2015-11-30 DIAGNOSIS — R1030 Lower abdominal pain, unspecified: Secondary | ICD-10-CM | POA: Diagnosis not present

## 2015-11-30 DIAGNOSIS — D631 Anemia in chronic kidney disease: Secondary | ICD-10-CM | POA: Diagnosis not present

## 2015-11-30 DIAGNOSIS — N186 End stage renal disease: Secondary | ICD-10-CM | POA: Diagnosis not present

## 2015-11-30 DIAGNOSIS — T8241XA Breakdown (mechanical) of vascular dialysis catheter, initial encounter: Secondary | ICD-10-CM | POA: Diagnosis not present

## 2015-11-30 DIAGNOSIS — N2581 Secondary hyperparathyroidism of renal origin: Secondary | ICD-10-CM | POA: Diagnosis not present

## 2015-11-30 DIAGNOSIS — Z992 Dependence on renal dialysis: Secondary | ICD-10-CM | POA: Diagnosis not present

## 2015-11-30 NOTE — ED Notes (Signed)
Patient returned from Ultrasound. 

## 2015-11-30 NOTE — ED Notes (Signed)
Updated patient's family on her status.

## 2015-11-30 NOTE — Discharge Instructions (Signed)
Dialysis Vascular Access Malfunction °A vascular access is an entrance to your blood vessels that can be used for dialysis. A vascular access can be made in one of several ways:  °· Joining an artery to a vein under your skin to make a bigger blood vessel called a fistula.   °· Joining an artery to a vein under your skin using a soft tube called a graft.   °· Placing a thin, flexible tube (catheter) in a large vein, usually in your neck.   °A vascular access may malfunction or become blocked.  °WHAT CAN CAUSE YOUR VASCULAR ACCESS TO MALFUNCTION? °· Infection (common).   °· A blood clot inside a part of the fistula, graft, or catheter. A blood clot can completely or partially block the flow of blood.   °· A kink in the graft or catheter.   °· A collection of blood (called a hematoma or bruise) next to the graft or catheter that pushes against it, blocking the flow of blood.   °WHAT ARE SIGNS AND SYMPTOMS OF VASCULAR ACCESS MALFUNCTION? °· There is a change in the vibration or pulse of your fistula or graft. °· The vibration or pulse of your fistula or graft is gone.   °· There is new or unusual swelling of the area around the access.   °· There was an unsuccessful puncture of your access by the dialysis team.   °· The flow of blood through the fistula, graft, or catheter is too slow for effective dialysis.   °· When routine dialysis is completed and the needle is removed, bleeding lasts for too long a time.   °WHAT HAPPENS IF MY VASCULAR ACCESS MALFUNCTIONS? °Your health care provider may order blood work, cultures, or an X-ray test in order to learn what may be wrong with your vascular access. The X-ray test involves the injection of a liquid into the vascular access. The liquid shows up on the X-ray and allows your health care provider to see if there is a blockage in the vascular access.  °Treatment varies depending on the cause of the malfunction:   °· If the vascular access is infected, your health care provider  may prescribe antibiotic medicine to control the infection.   °· If a clot is found in the vascular access, you may need surgery to remove the clot.   °· If a blockage in the vascular access is due to some other cause (such as a kink in a graft), then you will likely need surgery to unblock or replace the graft.   °HOME CARE INSTRUCTIONS: °Follow up with your surgeon or other health care provider if you were instructed to do so. This is very important. Any delay in follow-up could cause permanent dysfunction of the vascular access, which may be dangerous.  °SEEK MEDICAL CARE IF:  °· Fever develops.   °· Swelling and pain around the vascular access gets worse or new pain develops. °· Pain, numbness, or an unusual pale skin color develops in the hand on the side of your vascular access. °SEEK IMMEDIATE MEDICAL CARE IF: °Unusual bleeding develops at the location of the vascular access. °MAKE SURE YOU: °· Understand these instructions. °· Will watch your condition. °· Will get help right away if you are not doing well or get worse. °  °This information is not intended to replace advice given to you by your health care provider. Make sure you discuss any questions you have with your health care provider. °  °Document Released: 05/27/2006 Document Revised: 07/15/2014 Document Reviewed: 11/26/2012 °Elsevier Interactive Patient Education ©2016 [employer] ° °

## 2015-11-30 NOTE — ED Provider Notes (Signed)
[facility] Emergency Department Provider Note  ____________________________________________  Time seen: 11:52 PM  I have reviewed the triage vital signs and the nursing notes.   HISTORY  Chief Complaint No chief complaint on file.    HPI Robin Sanchez is a 50 y.o. female with history of chronic kidney disease dialysis Tuesday Thursday Saturday presents with bleeding from right femoral dialysis catheter site which was placed today at [facility]. Patient states that she has a AV fistula in her left arm which has not been used to date temporary right femoral dialysis catheter was placed initially by Dr. dew but unfortunately "clotted off". Resulting in catheter placement at [facility] today. Patient states that following dialysis today she noted blood on the bandage of the right groin as well in the catheter as well.   Past Medical History  Diagnosis Date  . Hypertension   . Dysrhythmia   . Shortness of breath dyspnea     occas  . Chronic kidney disease     dialysis t,t,s  . Cancer (HCC)     cervical  . Anemia   . Peripheral vascular disease ([place])   . Lupus ([place])     currently in remission    There are no active problems to display for this patient.   Past Surgical History  Procedure Laterality Date  . Peripheral vascular catheterization N/A 11/13/2015    Procedure: A/V Shuntogram/Fistulagram;  Surgeon: Algernon Huxley, MD;  Location: [place] [facility];  Service: Cardiovascular;  Laterality: N/A;  . Peripheral vascular catheterization  11/13/2015    Procedure: Dialysis/Perma Catheter Insertion;  Surgeon: Algernon Huxley, MD;  Location: [place] [facility];  Service: Cardiovascular;;  . Dilation and curettage of uterus    . Arteriovenous graft placement      currently dialysis access in right thigh  . Stents      MULTIPLE DIALYSIS STENTS  . Revision of arteriovenous goretex graft Left 11/23/2015    Procedure: REVISION OF ARTERIOVENOUS GORETEX GRAFT;  Surgeon:  Algernon Huxley, MD;  Location: [facility] ORS;  Service: Vascular;  Laterality: Left;    Current Outpatient Rx  Name  Route  Sig  Dispense  Refill  . amLODipine (NORVASC) 10 MG tablet   Oral   Take 10 mg by mouth daily.         . carvedilol (COREG) 25 MG tablet   Oral   Take 25 mg by mouth 2 (two) times daily with a meal.         . cinacalcet (SENSIPAR) 60 MG tablet   Oral   Take 60 mg by mouth daily.         Marland Kitchen HYDROcodone-acetaminophen (NORCO) 5-325 MG tablet   Oral   Take 1 tablet by mouth every 6 (six) hours as needed for moderate pain.   30 tablet   0   . sevelamer carbonate (RENVELA) 800 MG tablet   Oral   Take 800 mg by mouth 3 (three) times daily with meals.           Allergies Aspirin; Bleomycin; Penicillins; Shellfish allergy; and Vancomycin  No family history on file.  Social History Social History  Substance Use Topics  . Smoking status: Never Smoker   . Smokeless tobacco: Not on file  . Alcohol Use: No    Review of Systems  Constitutional: Negative for fever. Eyes: Negative for visual changes. ENT: Negative for sore throat. Cardiovascular: Negative for chest pain. Respiratory: Negative for shortness of breath.  Gastrointestinal: Negative for abdominal pain, vomiting and diarrhea. Genitourinary: Negative for dysuria. Musculoskeletal: Negative for back pain. Skin: Negative for rash. Neurological: Negative for headaches, focal weakness or numbness.   10-point ROS otherwise negative.  ____________________________________________   PHYSICAL EXAM:  VITAL SIGNS: ED Triage Vitals  Enc Vitals Group     BP 11/29/15 2332 146/70 mmHg     Pulse Rate 11/29/15 2332 91     Resp 11/29/15 2332 20     Temp 11/29/15 2332 98.2 F (36.8 C)     Temp Source 11/29/15 2332 Oral     SpO2 11/29/15 2332 99 %     Weight 11/29/15 2332 112 lb (50.803 kg)     Height 11/29/15 2332 4\' 8"  (1.422 m)     Head Cir --      Peak Flow --      Pain Score --      Pain Loc  --       Pain Edu? --      Excl. in [place]? --      Constitutional: Alert and oriented. Well appearing and in no distress. Eyes: Conjunctivae are normal. PERRL. Normal extraocular movements. ENT   Head: Normocephalic and atraumatic.   Nose: No congestion/rhinnorhea.   Mouth/Throat: Mucous membranes are moist.   Neck: No stridor. Hematological/Lymphatic/Immunilogical: No cervical lymphadenopathy. Cardiovascular: Normal rate, regular rhythm. Normal and symmetric distal pulses are present in all extremities. No murmurs, rubs, or gallops. Respiratory: Normal respiratory effort without tachypnea nor retractions. Breath sounds are clear and equal bilaterally. No wheezes/rales/rhonchi. Gastrointestinal: Soft and nontender. No distention. There is no CVA tenderness. Genitourinary: deferred Musculoskeletal: Nontender with normal range of motion in all extremities. No joint effusions.  No lower extremity tenderness nor edema. Neurologic:  Normal speech and language. No gross focal neurologic deficits are appreciated. Speech is normal.  Skin:  Skin is warm, dry and intact. No rash noted. Very scant bleeding noted at left femoral dialysis catheter site with stable clot in place around the catheter. Psychiatric: Mood and affect are normal. Speech and behavior are normal. Patient exhibits appropriate insight and judgment. [place] Extrem Low Right Ltd (Final result) Result time: 11/30/15 02:00:54   Procedure changed from US Venous Img Lower Unilateral Right      Final result by Rad Results In Interface (11/30/15 02:00:54)   Narrative:   CLINICAL DATA: Right groin pain. Evaluate for pseudoaneurysm. Post right femoral dialysis catheter placement.  EXAM: ULTRASOUND RIGHT LOWER EXTREMITY LIMITED  TECHNIQUE: Ultrasound examination of the lower extremity soft tissues was performed in the area of clinical concern.  COMPARISON: None.  FINDINGS: Targeted sonographic evaluation of the right  groin demonstrates dialysis catheter in place. No fluid collection or pseudoaneurysm. No focal vascular outpouching is seen sonographically. Arterial flow is seen in the femoral artery. Venous flow seen in the common femoral, femoral, and greater saphenous veins. No free fluid.  IMPRESSION: No evidence of pseudoaneurysm about right groin dialysis catheter. Arterial and venous flow was seen in the regional vessels.   Electronically Signed By: Jeb Levering M.D. On: 11/30/2015 02:00         INITIAL IMPRESSION / ASSESSMENT AND PLAN / ED COURSE  Pertinent labs & imaging results that were available during my care of the patient were reviewed by me and considered in my medical decision making (see chart for details).  ____________________________________________   FINAL CLINICAL IMPRESSION(S) / ED DIAGNOSES  Final diagnoses:  Complications, dialysis, catheter, mechanical, initial encounter ([place])  Gregor Hams, MD 11/30/15 [phone]

## 2015-11-30 NOTE — ED Notes (Signed)
MD at bedside. 

## 2015-11-30 NOTE — ED Notes (Signed)
Pt discharged to home.  Family member driving.  Discharge instructions reviewed.  Verbalized understanding.  No questions or concerns at this time.  Teach back verified.  Pt in NAD.  No items left in ED.   

## 2015-11-30 NOTE — ED Notes (Signed)
Sybil from [place] called and stated we need to come remove the patient's bandages from the catheter in her leg.  After removing the bandage, I noted no bleeding.  I left a boat of sterile 4x4's for Sybil to cover the catheter with when she returned the patient.

## 2015-11-30 NOTE — ED Notes (Signed)
Updated family on patients status.

## 2015-12-02 DIAGNOSIS — Z992 Dependence on renal dialysis: Secondary | ICD-10-CM | POA: Diagnosis not present

## 2015-12-02 DIAGNOSIS — D631 Anemia in chronic kidney disease: Secondary | ICD-10-CM | POA: Diagnosis not present

## 2015-12-02 DIAGNOSIS — N186 End stage renal disease: Secondary | ICD-10-CM | POA: Diagnosis not present

## 2015-12-02 DIAGNOSIS — D509 Iron deficiency anemia, unspecified: Secondary | ICD-10-CM | POA: Diagnosis not present

## 2015-12-02 DIAGNOSIS — N2581 Secondary hyperparathyroidism of renal origin: Secondary | ICD-10-CM | POA: Diagnosis not present

## 2015-12-05 DIAGNOSIS — Z992 Dependence on renal dialysis: Secondary | ICD-10-CM | POA: Diagnosis not present

## 2015-12-05 DIAGNOSIS — D631 Anemia in chronic kidney disease: Secondary | ICD-10-CM | POA: Diagnosis not present

## 2015-12-05 DIAGNOSIS — N2581 Secondary hyperparathyroidism of renal origin: Secondary | ICD-10-CM | POA: Diagnosis not present

## 2015-12-05 DIAGNOSIS — D509 Iron deficiency anemia, unspecified: Secondary | ICD-10-CM | POA: Diagnosis not present

## 2015-12-05 DIAGNOSIS — N186 End stage renal disease: Secondary | ICD-10-CM | POA: Diagnosis not present

## 2015-12-06 DIAGNOSIS — N186 End stage renal disease: Secondary | ICD-10-CM | POA: Diagnosis not present

## 2015-12-06 DIAGNOSIS — Z992 Dependence on renal dialysis: Secondary | ICD-10-CM | POA: Diagnosis not present

## 2015-12-08 DIAGNOSIS — Z992 Dependence on renal dialysis: Secondary | ICD-10-CM | POA: Diagnosis not present

## 2015-12-08 DIAGNOSIS — N186 End stage renal disease: Secondary | ICD-10-CM | POA: Diagnosis not present

## 2015-12-08 DIAGNOSIS — D509 Iron deficiency anemia, unspecified: Secondary | ICD-10-CM | POA: Diagnosis not present

## 2015-12-08 DIAGNOSIS — N2581 Secondary hyperparathyroidism of renal origin: Secondary | ICD-10-CM | POA: Diagnosis not present

## 2015-12-08 DIAGNOSIS — D631 Anemia in chronic kidney disease: Secondary | ICD-10-CM | POA: Diagnosis not present

## 2015-12-09 DIAGNOSIS — D509 Iron deficiency anemia, unspecified: Secondary | ICD-10-CM | POA: Diagnosis not present

## 2015-12-09 DIAGNOSIS — Z992 Dependence on renal dialysis: Secondary | ICD-10-CM | POA: Diagnosis not present

## 2015-12-09 DIAGNOSIS — N2581 Secondary hyperparathyroidism of renal origin: Secondary | ICD-10-CM | POA: Diagnosis not present

## 2015-12-09 DIAGNOSIS — D631 Anemia in chronic kidney disease: Secondary | ICD-10-CM | POA: Diagnosis not present

## 2015-12-09 DIAGNOSIS — N186 End stage renal disease: Secondary | ICD-10-CM | POA: Diagnosis not present

## 2015-12-12 DIAGNOSIS — Z992 Dependence on renal dialysis: Secondary | ICD-10-CM | POA: Diagnosis not present

## 2015-12-12 DIAGNOSIS — D631 Anemia in chronic kidney disease: Secondary | ICD-10-CM | POA: Diagnosis not present

## 2015-12-12 DIAGNOSIS — N186 End stage renal disease: Secondary | ICD-10-CM | POA: Diagnosis not present

## 2015-12-12 DIAGNOSIS — D509 Iron deficiency anemia, unspecified: Secondary | ICD-10-CM | POA: Diagnosis not present

## 2015-12-12 DIAGNOSIS — N2581 Secondary hyperparathyroidism of renal origin: Secondary | ICD-10-CM | POA: Diagnosis not present

## 2015-12-14 DIAGNOSIS — D631 Anemia in chronic kidney disease: Secondary | ICD-10-CM | POA: Diagnosis not present

## 2015-12-14 DIAGNOSIS — D509 Iron deficiency anemia, unspecified: Secondary | ICD-10-CM | POA: Diagnosis not present

## 2015-12-14 DIAGNOSIS — Z992 Dependence on renal dialysis: Secondary | ICD-10-CM | POA: Diagnosis not present

## 2015-12-14 DIAGNOSIS — N186 End stage renal disease: Secondary | ICD-10-CM | POA: Diagnosis not present

## 2015-12-14 DIAGNOSIS — N2581 Secondary hyperparathyroidism of renal origin: Secondary | ICD-10-CM | POA: Diagnosis not present

## 2015-12-15 DIAGNOSIS — Z1231 Encounter for screening mammogram for malignant neoplasm of breast: Secondary | ICD-10-CM | POA: Diagnosis not present

## 2015-12-15 DIAGNOSIS — N898 Other specified noninflammatory disorders of vagina: Secondary | ICD-10-CM | POA: Diagnosis not present

## 2015-12-15 DIAGNOSIS — Z124 Encounter for screening for malignant neoplasm of cervix: Secondary | ICD-10-CM | POA: Diagnosis not present

## 2015-12-15 DIAGNOSIS — R87610 Atypical squamous cells of undetermined significance on cytologic smear of cervix (ASC-US): Secondary | ICD-10-CM | POA: Diagnosis not present

## 2015-12-16 DIAGNOSIS — Z992 Dependence on renal dialysis: Secondary | ICD-10-CM | POA: Diagnosis not present

## 2015-12-16 DIAGNOSIS — D509 Iron deficiency anemia, unspecified: Secondary | ICD-10-CM | POA: Diagnosis not present

## 2015-12-16 DIAGNOSIS — N186 End stage renal disease: Secondary | ICD-10-CM | POA: Diagnosis not present

## 2015-12-16 DIAGNOSIS — D631 Anemia in chronic kidney disease: Secondary | ICD-10-CM | POA: Diagnosis not present

## 2015-12-16 DIAGNOSIS — N2581 Secondary hyperparathyroidism of renal origin: Secondary | ICD-10-CM | POA: Diagnosis not present

## 2015-12-19 DIAGNOSIS — N2581 Secondary hyperparathyroidism of renal origin: Secondary | ICD-10-CM | POA: Diagnosis not present

## 2015-12-19 DIAGNOSIS — D631 Anemia in chronic kidney disease: Secondary | ICD-10-CM | POA: Diagnosis not present

## 2015-12-19 DIAGNOSIS — D509 Iron deficiency anemia, unspecified: Secondary | ICD-10-CM | POA: Diagnosis not present

## 2015-12-19 DIAGNOSIS — Z992 Dependence on renal dialysis: Secondary | ICD-10-CM | POA: Diagnosis not present

## 2015-12-19 DIAGNOSIS — N186 End stage renal disease: Secondary | ICD-10-CM | POA: Diagnosis not present

## 2015-12-20 NOTE — Unmapped (Signed)
Lynn Erickson is a 50 year old who presents the dental clinic for pre transplant evaluation    HPI Pt has ESRD secondary to lupus nephritis. She has been referred by Lynn Erickson for consultation regarding her candidacy for kidney transplantation. The patient was diagnosed with systemic lupus erythematosus in 2000. She initially presented joint pain, swelling and skin lesions. Around 2002 began chemo to treat lupus but she began  to have infections and this was discontinued.  She was noted to have lupus nephritis in 2005 and underwent a kidney biopsy in [place]. Treatment included prednisone, cyclophosphamide, and Plaquenil. Despite therapy she progressed to end-stage kidney disease in 2007. Records from [place] show the initiation date of hemodialysis to be 05/05/2007. Moved to [place]  Last year. The patient is on a  Tu, Th, Sat dialysis schedule.  From 2014-2017 the SLE has been in remission. After dental evaluation she will move to kidney transplant candidacy.    Past Medical and Surgical History    1. 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 The results of the kidney biopsy performed in 2005 are not available. This biopsy was performed as [place] Dialysis initiation was in 2007 per patient history.?? ??  2. Systemic lupus erythematosus. Patient states original manifestations included joint pain, fatigue, skin rash, and edema. She was eventually diagnosed with lupus nephritis. Therapies in the past have included Plaquenil, prednisone, and oral cyclophosphamide. She has not previously undergone screening cystoscopy.  3. Plaquenil associated vision changes. This necessitated discontinuation of Plaquenil in the past.  4. Hypertension secondary to chronic kidney disease.  5. Nephrolithiasis. Patient states she had a single symptomatic kidney stone in the past.  6. History of abnormal Pap smear. Patient underwent a cone biopsy. She states a Pap smear in January 2016 was negative. 7. Status post bilateral tubal ligation.  8. History of hepatitis B surface antibody positive, hepatitis B core antibody positive.  9. 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 Patient was born in the [place] and lived several years in [place]  10. Colonoscopy was performed this year by patient report. She states the results revealed no evidence of malignancy.  11. Cardiac stress test and echocardiogram in January 2016 in [place]. Patient states these results were unremarkable.  12. Shortness of breath    Meds    Norvasc  Sensipar 60 mg  Coreg 25mg   Renvela 800mg  with each meal    Past Dental Hx  Over the past year no regular dental care. In [place] she received regular dental care.    Vitals  BP 120/73 P86 O2 99 Temp 97.2    Allergies    Penicillin, aspirin, and vancomycin are associated with rash. Shellfish causes angioedema.    Habits  ETOH (-)  Tobacco (-)  Rec (-)    Exam  Radiographic  Pan taken, No bony pathology detected , multiple missing and restored teeth, some areas of radiographic calculus            Extraoral  R parotid gland enlargment, thyroid midline, no cervical adenopathy, some facial puffiness (pt reports that this is lupus associated occurs every morning, is sometimes associated with assymetry and resolves in the PM)    Intraoral  Soft tissue:  Pink, no erythema, mild edema, no mucosal pathology detected    Hard tissue:  Caries 19M, 11D, 64M, 31 O,   Missing 1,3-5, 12-14, 19,30    Assessment  [place]    Recommendation  Perio- debridement  and probing  Restorative- #3 M is the major priority, 11D, 93M and 31-0 are lower priority  Maxillary partial denture    No abcessess or significantly generalized inflammation, recommend cleaning and repair of #3 then  moving forward with transplant from dental stand point

## 2015-12-21 DIAGNOSIS — N186 End stage renal disease: Secondary | ICD-10-CM | POA: Diagnosis not present

## 2015-12-21 DIAGNOSIS — N2581 Secondary hyperparathyroidism of renal origin: Secondary | ICD-10-CM | POA: Diagnosis not present

## 2015-12-21 DIAGNOSIS — D509 Iron deficiency anemia, unspecified: Secondary | ICD-10-CM | POA: Diagnosis not present

## 2015-12-21 DIAGNOSIS — Z992 Dependence on renal dialysis: Secondary | ICD-10-CM | POA: Diagnosis not present

## 2015-12-21 DIAGNOSIS — D631 Anemia in chronic kidney disease: Secondary | ICD-10-CM | POA: Diagnosis not present

## 2015-12-23 DIAGNOSIS — D631 Anemia in chronic kidney disease: Secondary | ICD-10-CM | POA: Diagnosis not present

## 2015-12-23 DIAGNOSIS — N186 End stage renal disease: Secondary | ICD-10-CM | POA: Diagnosis not present

## 2015-12-23 DIAGNOSIS — Z992 Dependence on renal dialysis: Secondary | ICD-10-CM | POA: Diagnosis not present

## 2015-12-23 DIAGNOSIS — N2581 Secondary hyperparathyroidism of renal origin: Secondary | ICD-10-CM | POA: Diagnosis not present

## 2015-12-23 DIAGNOSIS — D509 Iron deficiency anemia, unspecified: Secondary | ICD-10-CM | POA: Diagnosis not present

## 2015-12-26 DIAGNOSIS — D631 Anemia in chronic kidney disease: Secondary | ICD-10-CM | POA: Diagnosis not present

## 2015-12-26 DIAGNOSIS — Z992 Dependence on renal dialysis: Secondary | ICD-10-CM | POA: Diagnosis not present

## 2015-12-26 DIAGNOSIS — N2581 Secondary hyperparathyroidism of renal origin: Secondary | ICD-10-CM | POA: Diagnosis not present

## 2015-12-26 DIAGNOSIS — D509 Iron deficiency anemia, unspecified: Secondary | ICD-10-CM | POA: Diagnosis not present

## 2015-12-26 DIAGNOSIS — N186 End stage renal disease: Secondary | ICD-10-CM | POA: Diagnosis not present

## 2015-12-27 DIAGNOSIS — N83201 Unspecified ovarian cyst, right side: Secondary | ICD-10-CM | POA: Diagnosis not present

## 2015-12-27 DIAGNOSIS — N83291 Other ovarian cyst, right side: Secondary | ICD-10-CM | POA: Diagnosis not present

## 2015-12-27 DIAGNOSIS — N898 Other specified noninflammatory disorders of vagina: Secondary | ICD-10-CM | POA: Diagnosis not present

## 2015-12-27 DIAGNOSIS — N858 Other specified noninflammatory disorders of uterus: Secondary | ICD-10-CM | POA: Diagnosis not present

## 2015-12-28 DIAGNOSIS — D509 Iron deficiency anemia, unspecified: Secondary | ICD-10-CM | POA: Diagnosis not present

## 2015-12-28 DIAGNOSIS — N186 End stage renal disease: Secondary | ICD-10-CM | POA: Diagnosis not present

## 2015-12-28 DIAGNOSIS — Z992 Dependence on renal dialysis: Secondary | ICD-10-CM | POA: Diagnosis not present

## 2015-12-28 DIAGNOSIS — N2581 Secondary hyperparathyroidism of renal origin: Secondary | ICD-10-CM | POA: Diagnosis not present

## 2015-12-28 DIAGNOSIS — D631 Anemia in chronic kidney disease: Secondary | ICD-10-CM | POA: Diagnosis not present

## 2015-12-29 DIAGNOSIS — I10 Essential (primary) hypertension: Secondary | ICD-10-CM | POA: Diagnosis not present

## 2015-12-29 DIAGNOSIS — T82318A Breakdown (mechanical) of other vascular grafts, initial encounter: Secondary | ICD-10-CM | POA: Diagnosis not present

## 2015-12-29 DIAGNOSIS — Z992 Dependence on renal dialysis: Secondary | ICD-10-CM | POA: Diagnosis not present

## 2015-12-29 DIAGNOSIS — T82868A Thrombosis of vascular prosthetic devices, implants and grafts, initial encounter: Secondary | ICD-10-CM | POA: Diagnosis not present

## 2015-12-29 DIAGNOSIS — Y841 Kidney dialysis as the cause of abnormal reaction of the patient, or of later complication, without mention of misadventure at the time of the procedure: Secondary | ICD-10-CM | POA: Diagnosis not present

## 2015-12-29 DIAGNOSIS — N186 End stage renal disease: Secondary | ICD-10-CM | POA: Diagnosis not present

## 2015-12-30 DIAGNOSIS — N186 End stage renal disease: Secondary | ICD-10-CM | POA: Diagnosis not present

## 2015-12-30 DIAGNOSIS — D631 Anemia in chronic kidney disease: Secondary | ICD-10-CM | POA: Diagnosis not present

## 2015-12-30 DIAGNOSIS — D509 Iron deficiency anemia, unspecified: Secondary | ICD-10-CM | POA: Diagnosis not present

## 2015-12-30 DIAGNOSIS — Z992 Dependence on renal dialysis: Secondary | ICD-10-CM | POA: Diagnosis not present

## 2015-12-30 DIAGNOSIS — N2581 Secondary hyperparathyroidism of renal origin: Secondary | ICD-10-CM | POA: Diagnosis not present

## 2016-01-01 DIAGNOSIS — H353132 Nonexudative age-related macular degeneration, bilateral, intermediate dry stage: Secondary | ICD-10-CM | POA: Diagnosis not present

## 2016-01-02 DIAGNOSIS — Z992 Dependence on renal dialysis: Secondary | ICD-10-CM | POA: Diagnosis not present

## 2016-01-02 DIAGNOSIS — D509 Iron deficiency anemia, unspecified: Secondary | ICD-10-CM | POA: Diagnosis not present

## 2016-01-02 DIAGNOSIS — N2581 Secondary hyperparathyroidism of renal origin: Secondary | ICD-10-CM | POA: Diagnosis not present

## 2016-01-02 DIAGNOSIS — N186 End stage renal disease: Secondary | ICD-10-CM | POA: Diagnosis not present

## 2016-01-02 DIAGNOSIS — D631 Anemia in chronic kidney disease: Secondary | ICD-10-CM | POA: Diagnosis not present

## 2016-01-04 DIAGNOSIS — N2581 Secondary hyperparathyroidism of renal origin: Secondary | ICD-10-CM | POA: Diagnosis not present

## 2016-01-04 DIAGNOSIS — Z992 Dependence on renal dialysis: Secondary | ICD-10-CM | POA: Diagnosis not present

## 2016-01-04 DIAGNOSIS — N186 End stage renal disease: Secondary | ICD-10-CM | POA: Diagnosis not present

## 2016-01-04 DIAGNOSIS — D631 Anemia in chronic kidney disease: Secondary | ICD-10-CM | POA: Diagnosis not present

## 2016-01-04 DIAGNOSIS — D509 Iron deficiency anemia, unspecified: Secondary | ICD-10-CM | POA: Diagnosis not present

## 2016-01-05 DIAGNOSIS — Z992 Dependence on renal dialysis: Secondary | ICD-10-CM | POA: Diagnosis not present

## 2016-01-05 DIAGNOSIS — N186 End stage renal disease: Secondary | ICD-10-CM | POA: Diagnosis not present

## 2016-01-06 DIAGNOSIS — D631 Anemia in chronic kidney disease: Secondary | ICD-10-CM | POA: Diagnosis not present

## 2016-01-06 DIAGNOSIS — D509 Iron deficiency anemia, unspecified: Secondary | ICD-10-CM | POA: Diagnosis not present

## 2016-01-06 DIAGNOSIS — N2581 Secondary hyperparathyroidism of renal origin: Secondary | ICD-10-CM | POA: Diagnosis not present

## 2016-01-06 DIAGNOSIS — Z992 Dependence on renal dialysis: Secondary | ICD-10-CM | POA: Diagnosis not present

## 2016-01-06 DIAGNOSIS — N186 End stage renal disease: Secondary | ICD-10-CM | POA: Diagnosis not present

## 2016-01-09 DIAGNOSIS — N186 End stage renal disease: Secondary | ICD-10-CM | POA: Diagnosis not present

## 2016-01-09 DIAGNOSIS — Z992 Dependence on renal dialysis: Secondary | ICD-10-CM | POA: Diagnosis not present

## 2016-01-09 DIAGNOSIS — D631 Anemia in chronic kidney disease: Secondary | ICD-10-CM | POA: Diagnosis not present

## 2016-01-09 DIAGNOSIS — D509 Iron deficiency anemia, unspecified: Secondary | ICD-10-CM | POA: Diagnosis not present

## 2016-01-09 DIAGNOSIS — N2581 Secondary hyperparathyroidism of renal origin: Secondary | ICD-10-CM | POA: Diagnosis not present

## 2016-01-11 DIAGNOSIS — D509 Iron deficiency anemia, unspecified: Secondary | ICD-10-CM | POA: Diagnosis not present

## 2016-01-11 DIAGNOSIS — N186 End stage renal disease: Secondary | ICD-10-CM | POA: Diagnosis not present

## 2016-01-11 DIAGNOSIS — N2581 Secondary hyperparathyroidism of renal origin: Secondary | ICD-10-CM | POA: Diagnosis not present

## 2016-01-11 DIAGNOSIS — Z992 Dependence on renal dialysis: Secondary | ICD-10-CM | POA: Diagnosis not present

## 2016-01-11 DIAGNOSIS — D631 Anemia in chronic kidney disease: Secondary | ICD-10-CM | POA: Diagnosis not present

## 2016-01-13 DIAGNOSIS — D509 Iron deficiency anemia, unspecified: Secondary | ICD-10-CM | POA: Diagnosis not present

## 2016-01-13 DIAGNOSIS — Z992 Dependence on renal dialysis: Secondary | ICD-10-CM | POA: Diagnosis not present

## 2016-01-13 DIAGNOSIS — D631 Anemia in chronic kidney disease: Secondary | ICD-10-CM | POA: Diagnosis not present

## 2016-01-13 DIAGNOSIS — N186 End stage renal disease: Secondary | ICD-10-CM | POA: Diagnosis not present

## 2016-01-13 DIAGNOSIS — N2581 Secondary hyperparathyroidism of renal origin: Secondary | ICD-10-CM | POA: Diagnosis not present

## 2016-01-15 DIAGNOSIS — R928 Other abnormal and inconclusive findings on diagnostic imaging of breast: Secondary | ICD-10-CM | POA: Diagnosis not present

## 2016-01-16 ENCOUNTER — Other Ambulatory Visit: Payer: Self-pay | Admitting: Vascular Surgery

## 2016-01-16 DIAGNOSIS — N186 End stage renal disease: Secondary | ICD-10-CM | POA: Diagnosis not present

## 2016-01-16 DIAGNOSIS — D631 Anemia in chronic kidney disease: Secondary | ICD-10-CM | POA: Diagnosis not present

## 2016-01-16 DIAGNOSIS — Z992 Dependence on renal dialysis: Secondary | ICD-10-CM | POA: Diagnosis not present

## 2016-01-16 DIAGNOSIS — N2581 Secondary hyperparathyroidism of renal origin: Secondary | ICD-10-CM | POA: Diagnosis not present

## 2016-01-16 DIAGNOSIS — D509 Iron deficiency anemia, unspecified: Secondary | ICD-10-CM | POA: Diagnosis not present

## 2016-01-18 ENCOUNTER — Encounter
Admission: RE | Disposition: A | Discharge status: Home / Self Care | Payer: Self-pay | Source: Ambulatory Visit | Attending: Vascular Surgery

## 2016-01-18 ENCOUNTER — Ambulatory Visit
Admission: RE | Admit: 2016-01-18 | Discharge: 2016-01-18 | Disposition: A | Discharge status: Home / Self Care | Payer: Medicare Other | Source: Ambulatory Visit | Attending: Vascular Surgery | Admitting: Vascular Surgery

## 2016-01-18 DIAGNOSIS — R531 Weakness: Secondary | ICD-10-CM | POA: Insufficient documentation

## 2016-01-18 DIAGNOSIS — T82868A Thrombosis of vascular prosthetic devices, implants and grafts, initial encounter: Secondary | ICD-10-CM | POA: Diagnosis not present

## 2016-01-18 DIAGNOSIS — Z841 Family history of disorders of kidney and ureter: Secondary | ICD-10-CM | POA: Diagnosis not present

## 2016-01-18 DIAGNOSIS — Z800 Family history of malignant neoplasm of digestive organs: Secondary | ICD-10-CM | POA: Diagnosis not present

## 2016-01-18 DIAGNOSIS — Z992 Dependence on renal dialysis: Secondary | ICD-10-CM | POA: Insufficient documentation

## 2016-01-18 DIAGNOSIS — I10 Essential (primary) hypertension: Secondary | ICD-10-CM | POA: Diagnosis not present

## 2016-01-18 DIAGNOSIS — M2550 Pain in unspecified joint: Secondary | ICD-10-CM | POA: Diagnosis not present

## 2016-01-18 DIAGNOSIS — Z91013 Allergy to seafood: Secondary | ICD-10-CM | POA: Insufficient documentation

## 2016-01-18 DIAGNOSIS — Z880 Allergy status to penicillin: Secondary | ICD-10-CM | POA: Insufficient documentation

## 2016-01-18 DIAGNOSIS — I499 Cardiac arrhythmia, unspecified: Secondary | ICD-10-CM | POA: Diagnosis not present

## 2016-01-18 DIAGNOSIS — N186 End stage renal disease: Secondary | ICD-10-CM | POA: Insufficient documentation

## 2016-01-18 DIAGNOSIS — Z8349 Family history of other endocrine, nutritional and metabolic diseases: Secondary | ICD-10-CM | POA: Insufficient documentation

## 2016-01-18 DIAGNOSIS — I120 Hypertensive chronic kidney disease with stage 5 chronic kidney disease or end stage renal disease: Secondary | ICD-10-CM | POA: Insufficient documentation

## 2016-01-18 DIAGNOSIS — Z882 Allergy status to sulfonamides status: Secondary | ICD-10-CM | POA: Insufficient documentation

## 2016-01-18 DIAGNOSIS — M3214 Glomerular disease in systemic lupus erythematosus: Secondary | ICD-10-CM | POA: Diagnosis not present

## 2016-01-18 DIAGNOSIS — Z452 Encounter for adjustment and management of vascular access device: Secondary | ICD-10-CM | POA: Insufficient documentation

## 2016-01-18 DIAGNOSIS — Z886 Allergy status to analgesic agent status: Secondary | ICD-10-CM | POA: Diagnosis not present

## 2016-01-18 DIAGNOSIS — T82318A Breakdown (mechanical) of other vascular grafts, initial encounter: Secondary | ICD-10-CM | POA: Diagnosis not present

## 2016-01-18 DIAGNOSIS — Y841 Kidney dialysis as the cause of abnormal reaction of the patient, or of later complication, without mention of misadventure at the time of the procedure: Secondary | ICD-10-CM | POA: Diagnosis not present

## 2016-01-18 HISTORY — PX: PERIPHERAL VASCULAR CATHETERIZATION: SHX172C

## 2016-01-18 SURGERY — DIALYSIS/PERMA CATHETER REMOVAL
Anesthesia: Moderate Sedation

## 2016-01-18 MED ORDER — LIDOCAINE-EPINEPHRINE (PF) 1 %-1:200000 IJ SOLN
INTRAMUSCULAR | Status: DC | PRN
  Administered 2016-01-18: 20 mL via INTRADERMAL

## 2016-01-18 SURGICAL SUPPLY — 4 items
FCP FG STRG 5.5XNS LF DISP (INSTRUMENTS) ×1
FORCEPS FG STRG 5.5XNS LF DISP (INSTRUMENTS) ×1 IMPLANT
FORCEPS KELLY 5.5 STR (INSTRUMENTS) ×2
TRAY LACERAT/PLASTIC (MISCELLANEOUS) ×3 IMPLANT

## 2016-01-18 NOTE — Discharge Instructions (Signed)

## 2016-01-18 NOTE — Op Note (Signed)
Operative Note     Preoperative diagnosis:   1. ESRD with functional permanent access  Postoperative diagnosis:  1. ESRD with functional permanent access  Procedure:  Removal of right femoral Permcath  Surgeon:  Leotis Pain, MD  Anesthesia:  Local  EBL:  Minimal  Indication for the Procedure:  The patient has a functional permanent dialysis access and no longer needs their permcath.  This can be removed.  Risks and benefits are discussed and informed consent is obtained.  Description of the Procedure:  The patient's right groin and existing catheter were sterilely prepped and draped. The area around the catheter was anesthetized copiously with 1% lidocaine. The catheter was dissected out with curved hemostats until the cuff was freed from the surrounding fibrous sheath. The fiber sheath was transected, and the catheter was then removed in its entirety using gentle traction. Pressure was held and sterile dressings were placed. The patient tolerated the procedure well and was taken to the recovery room in stable condition.     Alysson Geist  01/18/2016, 12:40 PM

## 2016-01-18 NOTE — H&P (Signed)
  [facility] History & Physical Update  The patient was interviewed and re-examined.  The patient's previous History and Physical has been reviewed and is unchanged.  There is no change in the plan of care. We plan to proceed with the scheduled procedure.  Liani Caris, MD  01/18/2016, 12:05 PM

## 2016-01-19 ENCOUNTER — Encounter: Payer: Self-pay | Admitting: Vascular Surgery

## 2016-01-19 DIAGNOSIS — N186 End stage renal disease: Secondary | ICD-10-CM | POA: Diagnosis not present

## 2016-01-19 DIAGNOSIS — N2581 Secondary hyperparathyroidism of renal origin: Secondary | ICD-10-CM | POA: Diagnosis not present

## 2016-01-19 DIAGNOSIS — Z992 Dependence on renal dialysis: Secondary | ICD-10-CM | POA: Diagnosis not present

## 2016-01-19 DIAGNOSIS — D631 Anemia in chronic kidney disease: Secondary | ICD-10-CM | POA: Diagnosis not present

## 2016-01-19 DIAGNOSIS — D509 Iron deficiency anemia, unspecified: Secondary | ICD-10-CM | POA: Diagnosis not present

## 2016-01-20 DIAGNOSIS — N186 End stage renal disease: Secondary | ICD-10-CM | POA: Diagnosis not present

## 2016-01-20 DIAGNOSIS — N2581 Secondary hyperparathyroidism of renal origin: Secondary | ICD-10-CM | POA: Diagnosis not present

## 2016-01-20 DIAGNOSIS — Z992 Dependence on renal dialysis: Secondary | ICD-10-CM | POA: Diagnosis not present

## 2016-01-20 DIAGNOSIS — D631 Anemia in chronic kidney disease: Secondary | ICD-10-CM | POA: Diagnosis not present

## 2016-01-20 DIAGNOSIS — D509 Iron deficiency anemia, unspecified: Secondary | ICD-10-CM | POA: Diagnosis not present

## 2016-01-23 DIAGNOSIS — Z992 Dependence on renal dialysis: Secondary | ICD-10-CM | POA: Diagnosis not present

## 2016-01-23 DIAGNOSIS — N186 End stage renal disease: Secondary | ICD-10-CM | POA: Diagnosis not present

## 2016-01-23 DIAGNOSIS — N2581 Secondary hyperparathyroidism of renal origin: Secondary | ICD-10-CM | POA: Diagnosis not present

## 2016-01-23 DIAGNOSIS — D509 Iron deficiency anemia, unspecified: Secondary | ICD-10-CM | POA: Diagnosis not present

## 2016-01-23 DIAGNOSIS — D631 Anemia in chronic kidney disease: Secondary | ICD-10-CM | POA: Diagnosis not present

## 2016-01-25 DIAGNOSIS — D631 Anemia in chronic kidney disease: Secondary | ICD-10-CM | POA: Diagnosis not present

## 2016-01-25 DIAGNOSIS — D509 Iron deficiency anemia, unspecified: Secondary | ICD-10-CM | POA: Diagnosis not present

## 2016-01-25 DIAGNOSIS — N2581 Secondary hyperparathyroidism of renal origin: Secondary | ICD-10-CM | POA: Diagnosis not present

## 2016-01-25 DIAGNOSIS — Z992 Dependence on renal dialysis: Secondary | ICD-10-CM | POA: Diagnosis not present

## 2016-01-25 DIAGNOSIS — N186 End stage renal disease: Secondary | ICD-10-CM | POA: Diagnosis not present

## 2016-01-27 DIAGNOSIS — Z992 Dependence on renal dialysis: Secondary | ICD-10-CM | POA: Diagnosis not present

## 2016-01-27 DIAGNOSIS — D509 Iron deficiency anemia, unspecified: Secondary | ICD-10-CM | POA: Diagnosis not present

## 2016-01-27 DIAGNOSIS — N186 End stage renal disease: Secondary | ICD-10-CM | POA: Diagnosis not present

## 2016-01-27 DIAGNOSIS — N2581 Secondary hyperparathyroidism of renal origin: Secondary | ICD-10-CM | POA: Diagnosis not present

## 2016-01-27 DIAGNOSIS — D631 Anemia in chronic kidney disease: Secondary | ICD-10-CM | POA: Diagnosis not present

## 2016-01-30 DIAGNOSIS — N2581 Secondary hyperparathyroidism of renal origin: Secondary | ICD-10-CM | POA: Diagnosis not present

## 2016-01-30 DIAGNOSIS — N186 End stage renal disease: Secondary | ICD-10-CM | POA: Diagnosis not present

## 2016-01-30 DIAGNOSIS — Z992 Dependence on renal dialysis: Secondary | ICD-10-CM | POA: Diagnosis not present

## 2016-01-30 DIAGNOSIS — D631 Anemia in chronic kidney disease: Secondary | ICD-10-CM | POA: Diagnosis not present

## 2016-01-30 DIAGNOSIS — D509 Iron deficiency anemia, unspecified: Secondary | ICD-10-CM | POA: Diagnosis not present

## 2016-02-01 DIAGNOSIS — Z992 Dependence on renal dialysis: Secondary | ICD-10-CM | POA: Diagnosis not present

## 2016-02-01 DIAGNOSIS — D631 Anemia in chronic kidney disease: Secondary | ICD-10-CM | POA: Diagnosis not present

## 2016-02-01 DIAGNOSIS — N186 End stage renal disease: Secondary | ICD-10-CM | POA: Diagnosis not present

## 2016-02-01 DIAGNOSIS — N2581 Secondary hyperparathyroidism of renal origin: Secondary | ICD-10-CM | POA: Diagnosis not present

## 2016-02-01 DIAGNOSIS — D509 Iron deficiency anemia, unspecified: Secondary | ICD-10-CM | POA: Diagnosis not present

## 2016-02-03 DIAGNOSIS — N186 End stage renal disease: Secondary | ICD-10-CM | POA: Diagnosis not present

## 2016-02-03 DIAGNOSIS — D631 Anemia in chronic kidney disease: Secondary | ICD-10-CM | POA: Diagnosis not present

## 2016-02-03 DIAGNOSIS — D509 Iron deficiency anemia, unspecified: Secondary | ICD-10-CM | POA: Diagnosis not present

## 2016-02-03 DIAGNOSIS — Z992 Dependence on renal dialysis: Secondary | ICD-10-CM | POA: Diagnosis not present

## 2016-02-03 DIAGNOSIS — N2581 Secondary hyperparathyroidism of renal origin: Secondary | ICD-10-CM | POA: Diagnosis not present

## 2016-02-05 DIAGNOSIS — Z992 Dependence on renal dialysis: Secondary | ICD-10-CM | POA: Diagnosis not present

## 2016-02-05 DIAGNOSIS — N186 End stage renal disease: Secondary | ICD-10-CM | POA: Diagnosis not present

## 2016-02-06 DIAGNOSIS — Z992 Dependence on renal dialysis: Secondary | ICD-10-CM | POA: Diagnosis not present

## 2016-02-06 DIAGNOSIS — N2581 Secondary hyperparathyroidism of renal origin: Secondary | ICD-10-CM | POA: Diagnosis not present

## 2016-02-06 DIAGNOSIS — N186 End stage renal disease: Secondary | ICD-10-CM | POA: Diagnosis not present

## 2016-02-06 DIAGNOSIS — D631 Anemia in chronic kidney disease: Secondary | ICD-10-CM | POA: Diagnosis not present

## 2016-02-06 DIAGNOSIS — D509 Iron deficiency anemia, unspecified: Secondary | ICD-10-CM | POA: Diagnosis not present

## 2016-02-08 DIAGNOSIS — N186 End stage renal disease: Secondary | ICD-10-CM | POA: Diagnosis not present

## 2016-02-08 DIAGNOSIS — N2581 Secondary hyperparathyroidism of renal origin: Secondary | ICD-10-CM | POA: Diagnosis not present

## 2016-02-08 DIAGNOSIS — D509 Iron deficiency anemia, unspecified: Secondary | ICD-10-CM | POA: Diagnosis not present

## 2016-02-08 DIAGNOSIS — D631 Anemia in chronic kidney disease: Secondary | ICD-10-CM | POA: Diagnosis not present

## 2016-02-08 DIAGNOSIS — Z992 Dependence on renal dialysis: Secondary | ICD-10-CM | POA: Diagnosis not present

## 2016-02-10 DIAGNOSIS — D509 Iron deficiency anemia, unspecified: Secondary | ICD-10-CM | POA: Diagnosis not present

## 2016-02-10 DIAGNOSIS — N186 End stage renal disease: Secondary | ICD-10-CM | POA: Diagnosis not present

## 2016-02-10 DIAGNOSIS — D631 Anemia in chronic kidney disease: Secondary | ICD-10-CM | POA: Diagnosis not present

## 2016-02-10 DIAGNOSIS — Z992 Dependence on renal dialysis: Secondary | ICD-10-CM | POA: Diagnosis not present

## 2016-02-10 DIAGNOSIS — N2581 Secondary hyperparathyroidism of renal origin: Secondary | ICD-10-CM | POA: Diagnosis not present

## 2016-02-13 DIAGNOSIS — D509 Iron deficiency anemia, unspecified: Secondary | ICD-10-CM | POA: Diagnosis not present

## 2016-02-13 DIAGNOSIS — N2581 Secondary hyperparathyroidism of renal origin: Secondary | ICD-10-CM | POA: Diagnosis not present

## 2016-02-13 DIAGNOSIS — Z992 Dependence on renal dialysis: Secondary | ICD-10-CM | POA: Diagnosis not present

## 2016-02-13 DIAGNOSIS — D631 Anemia in chronic kidney disease: Secondary | ICD-10-CM | POA: Diagnosis not present

## 2016-02-13 DIAGNOSIS — N186 End stage renal disease: Secondary | ICD-10-CM | POA: Diagnosis not present

## 2016-02-15 DIAGNOSIS — N2581 Secondary hyperparathyroidism of renal origin: Secondary | ICD-10-CM | POA: Diagnosis not present

## 2016-02-15 DIAGNOSIS — D631 Anemia in chronic kidney disease: Secondary | ICD-10-CM | POA: Diagnosis not present

## 2016-02-15 DIAGNOSIS — N186 End stage renal disease: Secondary | ICD-10-CM | POA: Diagnosis not present

## 2016-02-15 DIAGNOSIS — D509 Iron deficiency anemia, unspecified: Secondary | ICD-10-CM | POA: Diagnosis not present

## 2016-02-15 DIAGNOSIS — Z992 Dependence on renal dialysis: Secondary | ICD-10-CM | POA: Diagnosis not present

## 2016-02-17 DIAGNOSIS — N186 End stage renal disease: Secondary | ICD-10-CM | POA: Diagnosis not present

## 2016-02-17 DIAGNOSIS — N2581 Secondary hyperparathyroidism of renal origin: Secondary | ICD-10-CM | POA: Diagnosis not present

## 2016-02-17 DIAGNOSIS — D509 Iron deficiency anemia, unspecified: Secondary | ICD-10-CM | POA: Diagnosis not present

## 2016-02-17 DIAGNOSIS — Z992 Dependence on renal dialysis: Secondary | ICD-10-CM | POA: Diagnosis not present

## 2016-02-17 DIAGNOSIS — D631 Anemia in chronic kidney disease: Secondary | ICD-10-CM | POA: Diagnosis not present

## 2016-02-20 DIAGNOSIS — D509 Iron deficiency anemia, unspecified: Secondary | ICD-10-CM | POA: Diagnosis not present

## 2016-02-20 DIAGNOSIS — D631 Anemia in chronic kidney disease: Secondary | ICD-10-CM | POA: Diagnosis not present

## 2016-02-20 DIAGNOSIS — N2581 Secondary hyperparathyroidism of renal origin: Secondary | ICD-10-CM | POA: Diagnosis not present

## 2016-02-20 DIAGNOSIS — N186 End stage renal disease: Secondary | ICD-10-CM | POA: Diagnosis not present

## 2016-02-20 DIAGNOSIS — Z992 Dependence on renal dialysis: Secondary | ICD-10-CM | POA: Diagnosis not present

## 2016-02-22 DIAGNOSIS — D631 Anemia in chronic kidney disease: Secondary | ICD-10-CM | POA: Diagnosis not present

## 2016-02-22 DIAGNOSIS — N186 End stage renal disease: Secondary | ICD-10-CM | POA: Diagnosis not present

## 2016-02-22 DIAGNOSIS — N2581 Secondary hyperparathyroidism of renal origin: Secondary | ICD-10-CM | POA: Diagnosis not present

## 2016-02-22 DIAGNOSIS — Z992 Dependence on renal dialysis: Secondary | ICD-10-CM | POA: Diagnosis not present

## 2016-02-22 DIAGNOSIS — D509 Iron deficiency anemia, unspecified: Secondary | ICD-10-CM | POA: Diagnosis not present

## 2016-02-24 DIAGNOSIS — N186 End stage renal disease: Secondary | ICD-10-CM | POA: Diagnosis not present

## 2016-02-24 DIAGNOSIS — D509 Iron deficiency anemia, unspecified: Secondary | ICD-10-CM | POA: Diagnosis not present

## 2016-02-24 DIAGNOSIS — D631 Anemia in chronic kidney disease: Secondary | ICD-10-CM | POA: Diagnosis not present

## 2016-02-24 DIAGNOSIS — N2581 Secondary hyperparathyroidism of renal origin: Secondary | ICD-10-CM | POA: Diagnosis not present

## 2016-02-24 DIAGNOSIS — Z992 Dependence on renal dialysis: Secondary | ICD-10-CM | POA: Diagnosis not present

## 2016-02-27 DIAGNOSIS — Z992 Dependence on renal dialysis: Secondary | ICD-10-CM | POA: Diagnosis not present

## 2016-02-27 DIAGNOSIS — D631 Anemia in chronic kidney disease: Secondary | ICD-10-CM | POA: Diagnosis not present

## 2016-02-27 DIAGNOSIS — N186 End stage renal disease: Secondary | ICD-10-CM | POA: Diagnosis not present

## 2016-02-27 DIAGNOSIS — N2581 Secondary hyperparathyroidism of renal origin: Secondary | ICD-10-CM | POA: Diagnosis not present

## 2016-02-27 DIAGNOSIS — D509 Iron deficiency anemia, unspecified: Secondary | ICD-10-CM | POA: Diagnosis not present

## 2016-02-29 DIAGNOSIS — H02831 Dermatochalasis of right upper eyelid: Secondary | ICD-10-CM | POA: Diagnosis not present

## 2016-02-29 DIAGNOSIS — H02834 Dermatochalasis of left upper eyelid: Secondary | ICD-10-CM | POA: Diagnosis not present

## 2016-02-29 DIAGNOSIS — N2581 Secondary hyperparathyroidism of renal origin: Secondary | ICD-10-CM | POA: Diagnosis not present

## 2016-02-29 DIAGNOSIS — Z992 Dependence on renal dialysis: Secondary | ICD-10-CM | POA: Diagnosis not present

## 2016-02-29 DIAGNOSIS — D509 Iron deficiency anemia, unspecified: Secondary | ICD-10-CM | POA: Diagnosis not present

## 2016-02-29 DIAGNOSIS — D631 Anemia in chronic kidney disease: Secondary | ICD-10-CM | POA: Diagnosis not present

## 2016-02-29 DIAGNOSIS — N186 End stage renal disease: Secondary | ICD-10-CM | POA: Diagnosis not present

## 2016-03-02 DIAGNOSIS — D509 Iron deficiency anemia, unspecified: Secondary | ICD-10-CM | POA: Diagnosis not present

## 2016-03-02 DIAGNOSIS — N186 End stage renal disease: Secondary | ICD-10-CM | POA: Diagnosis not present

## 2016-03-02 DIAGNOSIS — Z992 Dependence on renal dialysis: Secondary | ICD-10-CM | POA: Diagnosis not present

## 2016-03-02 DIAGNOSIS — N2581 Secondary hyperparathyroidism of renal origin: Secondary | ICD-10-CM | POA: Diagnosis not present

## 2016-03-02 DIAGNOSIS — D631 Anemia in chronic kidney disease: Secondary | ICD-10-CM | POA: Diagnosis not present

## 2016-03-05 DIAGNOSIS — N2581 Secondary hyperparathyroidism of renal origin: Secondary | ICD-10-CM | POA: Diagnosis not present

## 2016-03-05 DIAGNOSIS — Z992 Dependence on renal dialysis: Secondary | ICD-10-CM | POA: Diagnosis not present

## 2016-03-05 DIAGNOSIS — D631 Anemia in chronic kidney disease: Secondary | ICD-10-CM | POA: Diagnosis not present

## 2016-03-05 DIAGNOSIS — D509 Iron deficiency anemia, unspecified: Secondary | ICD-10-CM | POA: Diagnosis not present

## 2016-03-05 DIAGNOSIS — N186 End stage renal disease: Secondary | ICD-10-CM | POA: Diagnosis not present

## 2016-03-07 DIAGNOSIS — D509 Iron deficiency anemia, unspecified: Secondary | ICD-10-CM | POA: Diagnosis not present

## 2016-03-07 DIAGNOSIS — N2581 Secondary hyperparathyroidism of renal origin: Secondary | ICD-10-CM | POA: Diagnosis not present

## 2016-03-07 DIAGNOSIS — N186 End stage renal disease: Secondary | ICD-10-CM | POA: Diagnosis not present

## 2016-03-07 DIAGNOSIS — Z992 Dependence on renal dialysis: Secondary | ICD-10-CM | POA: Diagnosis not present

## 2016-03-07 DIAGNOSIS — D631 Anemia in chronic kidney disease: Secondary | ICD-10-CM | POA: Diagnosis not present

## 2016-03-09 DIAGNOSIS — Z23 Encounter for immunization: Secondary | ICD-10-CM | POA: Diagnosis not present

## 2016-03-09 DIAGNOSIS — N186 End stage renal disease: Secondary | ICD-10-CM | POA: Diagnosis not present

## 2016-03-09 DIAGNOSIS — N2581 Secondary hyperparathyroidism of renal origin: Secondary | ICD-10-CM | POA: Diagnosis not present

## 2016-03-09 DIAGNOSIS — Z992 Dependence on renal dialysis: Secondary | ICD-10-CM | POA: Diagnosis not present

## 2016-03-09 DIAGNOSIS — D509 Iron deficiency anemia, unspecified: Secondary | ICD-10-CM | POA: Diagnosis not present

## 2016-03-09 DIAGNOSIS — D631 Anemia in chronic kidney disease: Secondary | ICD-10-CM | POA: Diagnosis not present

## 2016-03-12 DIAGNOSIS — D509 Iron deficiency anemia, unspecified: Secondary | ICD-10-CM | POA: Diagnosis not present

## 2016-03-12 DIAGNOSIS — Z23 Encounter for immunization: Secondary | ICD-10-CM | POA: Diagnosis not present

## 2016-03-12 DIAGNOSIS — Z992 Dependence on renal dialysis: Secondary | ICD-10-CM | POA: Diagnosis not present

## 2016-03-12 DIAGNOSIS — N2581 Secondary hyperparathyroidism of renal origin: Secondary | ICD-10-CM | POA: Diagnosis not present

## 2016-03-12 DIAGNOSIS — N186 End stage renal disease: Secondary | ICD-10-CM | POA: Diagnosis not present

## 2016-03-12 DIAGNOSIS — D631 Anemia in chronic kidney disease: Secondary | ICD-10-CM | POA: Diagnosis not present

## 2016-03-14 DIAGNOSIS — D631 Anemia in chronic kidney disease: Secondary | ICD-10-CM | POA: Diagnosis not present

## 2016-03-14 DIAGNOSIS — N186 End stage renal disease: Secondary | ICD-10-CM | POA: Diagnosis not present

## 2016-03-14 DIAGNOSIS — D509 Iron deficiency anemia, unspecified: Secondary | ICD-10-CM | POA: Diagnosis not present

## 2016-03-14 DIAGNOSIS — Z992 Dependence on renal dialysis: Secondary | ICD-10-CM | POA: Diagnosis not present

## 2016-03-14 DIAGNOSIS — N2581 Secondary hyperparathyroidism of renal origin: Secondary | ICD-10-CM | POA: Diagnosis not present

## 2016-03-14 DIAGNOSIS — Z23 Encounter for immunization: Secondary | ICD-10-CM | POA: Diagnosis not present

## 2016-03-16 DIAGNOSIS — Z23 Encounter for immunization: Secondary | ICD-10-CM | POA: Diagnosis not present

## 2016-03-16 DIAGNOSIS — D509 Iron deficiency anemia, unspecified: Secondary | ICD-10-CM | POA: Diagnosis not present

## 2016-03-16 DIAGNOSIS — D631 Anemia in chronic kidney disease: Secondary | ICD-10-CM | POA: Diagnosis not present

## 2016-03-16 DIAGNOSIS — N186 End stage renal disease: Secondary | ICD-10-CM | POA: Diagnosis not present

## 2016-03-16 DIAGNOSIS — Z992 Dependence on renal dialysis: Secondary | ICD-10-CM | POA: Diagnosis not present

## 2016-03-16 DIAGNOSIS — N2581 Secondary hyperparathyroidism of renal origin: Secondary | ICD-10-CM | POA: Diagnosis not present

## 2016-03-18 DIAGNOSIS — N2581 Secondary hyperparathyroidism of renal origin: Secondary | ICD-10-CM | POA: Diagnosis not present

## 2016-03-18 DIAGNOSIS — D631 Anemia in chronic kidney disease: Secondary | ICD-10-CM | POA: Diagnosis not present

## 2016-03-18 DIAGNOSIS — D509 Iron deficiency anemia, unspecified: Secondary | ICD-10-CM | POA: Diagnosis not present

## 2016-03-18 DIAGNOSIS — N186 End stage renal disease: Secondary | ICD-10-CM | POA: Diagnosis not present

## 2016-03-18 DIAGNOSIS — Z23 Encounter for immunization: Secondary | ICD-10-CM | POA: Diagnosis not present

## 2016-03-18 DIAGNOSIS — Z992 Dependence on renal dialysis: Secondary | ICD-10-CM | POA: Diagnosis not present

## 2016-03-21 ENCOUNTER — Encounter: Payer: Self-pay | Admitting: *Deleted

## 2016-03-21 DIAGNOSIS — N186 End stage renal disease: Secondary | ICD-10-CM | POA: Diagnosis not present

## 2016-03-21 DIAGNOSIS — D631 Anemia in chronic kidney disease: Secondary | ICD-10-CM | POA: Diagnosis not present

## 2016-03-21 DIAGNOSIS — Z23 Encounter for immunization: Secondary | ICD-10-CM | POA: Diagnosis not present

## 2016-03-21 DIAGNOSIS — Z992 Dependence on renal dialysis: Secondary | ICD-10-CM | POA: Diagnosis not present

## 2016-03-21 DIAGNOSIS — D509 Iron deficiency anemia, unspecified: Secondary | ICD-10-CM | POA: Diagnosis not present

## 2016-03-21 DIAGNOSIS — N2581 Secondary hyperparathyroidism of renal origin: Secondary | ICD-10-CM | POA: Diagnosis not present

## 2016-03-23 DIAGNOSIS — Z23 Encounter for immunization: Secondary | ICD-10-CM | POA: Diagnosis not present

## 2016-03-23 DIAGNOSIS — N2581 Secondary hyperparathyroidism of renal origin: Secondary | ICD-10-CM | POA: Diagnosis not present

## 2016-03-23 DIAGNOSIS — D631 Anemia in chronic kidney disease: Secondary | ICD-10-CM | POA: Diagnosis not present

## 2016-03-23 DIAGNOSIS — N186 End stage renal disease: Secondary | ICD-10-CM | POA: Diagnosis not present

## 2016-03-23 DIAGNOSIS — D509 Iron deficiency anemia, unspecified: Secondary | ICD-10-CM | POA: Diagnosis not present

## 2016-03-23 DIAGNOSIS — Z992 Dependence on renal dialysis: Secondary | ICD-10-CM | POA: Diagnosis not present

## 2016-03-26 ENCOUNTER — Ambulatory Visit: Admission: RE | Admit: 2016-03-26 | Payer: Medicare Other | Source: Ambulatory Visit | Admitting: Ophthalmology

## 2016-03-26 DIAGNOSIS — Z23 Encounter for immunization: Secondary | ICD-10-CM | POA: Diagnosis not present

## 2016-03-26 DIAGNOSIS — Z992 Dependence on renal dialysis: Secondary | ICD-10-CM | POA: Diagnosis not present

## 2016-03-26 DIAGNOSIS — D631 Anemia in chronic kidney disease: Secondary | ICD-10-CM | POA: Diagnosis not present

## 2016-03-26 DIAGNOSIS — N2581 Secondary hyperparathyroidism of renal origin: Secondary | ICD-10-CM | POA: Diagnosis not present

## 2016-03-26 DIAGNOSIS — N186 End stage renal disease: Secondary | ICD-10-CM | POA: Diagnosis not present

## 2016-03-26 DIAGNOSIS — D509 Iron deficiency anemia, unspecified: Secondary | ICD-10-CM | POA: Diagnosis not present

## 2016-03-26 HISTORY — DX: Age-related osteoporosis without current pathological fracture: M81.0

## 2016-03-26 HISTORY — DX: Gastro-esophageal reflux disease without esophagitis: K21.9

## 2016-03-26 SURGERY — BLEPHAROPLASTY
Anesthesia: IV Sedation (MBSC Only) | Laterality: Bilateral

## 2016-03-28 DIAGNOSIS — Z23 Encounter for immunization: Secondary | ICD-10-CM | POA: Diagnosis not present

## 2016-03-28 DIAGNOSIS — Z992 Dependence on renal dialysis: Secondary | ICD-10-CM | POA: Diagnosis not present

## 2016-03-28 DIAGNOSIS — D509 Iron deficiency anemia, unspecified: Secondary | ICD-10-CM | POA: Diagnosis not present

## 2016-03-28 DIAGNOSIS — D631 Anemia in chronic kidney disease: Secondary | ICD-10-CM | POA: Diagnosis not present

## 2016-03-28 DIAGNOSIS — N186 End stage renal disease: Secondary | ICD-10-CM | POA: Diagnosis not present

## 2016-03-28 DIAGNOSIS — N2581 Secondary hyperparathyroidism of renal origin: Secondary | ICD-10-CM | POA: Diagnosis not present

## 2016-03-30 DIAGNOSIS — N2581 Secondary hyperparathyroidism of renal origin: Secondary | ICD-10-CM | POA: Diagnosis not present

## 2016-03-30 DIAGNOSIS — D631 Anemia in chronic kidney disease: Secondary | ICD-10-CM | POA: Diagnosis not present

## 2016-03-30 DIAGNOSIS — Z23 Encounter for immunization: Secondary | ICD-10-CM | POA: Diagnosis not present

## 2016-03-30 DIAGNOSIS — D509 Iron deficiency anemia, unspecified: Secondary | ICD-10-CM | POA: Diagnosis not present

## 2016-03-30 DIAGNOSIS — N186 End stage renal disease: Secondary | ICD-10-CM | POA: Diagnosis not present

## 2016-03-30 DIAGNOSIS — Z992 Dependence on renal dialysis: Secondary | ICD-10-CM | POA: Diagnosis not present

## 2016-04-02 DIAGNOSIS — Z23 Encounter for immunization: Secondary | ICD-10-CM | POA: Diagnosis not present

## 2016-04-02 DIAGNOSIS — D509 Iron deficiency anemia, unspecified: Secondary | ICD-10-CM | POA: Diagnosis not present

## 2016-04-02 DIAGNOSIS — Z992 Dependence on renal dialysis: Secondary | ICD-10-CM | POA: Diagnosis not present

## 2016-04-02 DIAGNOSIS — N186 End stage renal disease: Secondary | ICD-10-CM | POA: Diagnosis not present

## 2016-04-02 DIAGNOSIS — D631 Anemia in chronic kidney disease: Secondary | ICD-10-CM | POA: Diagnosis not present

## 2016-04-02 DIAGNOSIS — N2581 Secondary hyperparathyroidism of renal origin: Secondary | ICD-10-CM | POA: Diagnosis not present

## 2016-04-04 DIAGNOSIS — Z23 Encounter for immunization: Secondary | ICD-10-CM | POA: Diagnosis not present

## 2016-04-04 DIAGNOSIS — N186 End stage renal disease: Secondary | ICD-10-CM | POA: Diagnosis not present

## 2016-04-04 DIAGNOSIS — Z992 Dependence on renal dialysis: Secondary | ICD-10-CM | POA: Diagnosis not present

## 2016-04-04 DIAGNOSIS — N2581 Secondary hyperparathyroidism of renal origin: Secondary | ICD-10-CM | POA: Diagnosis not present

## 2016-04-04 DIAGNOSIS — D631 Anemia in chronic kidney disease: Secondary | ICD-10-CM | POA: Diagnosis not present

## 2016-04-04 DIAGNOSIS — D509 Iron deficiency anemia, unspecified: Secondary | ICD-10-CM | POA: Diagnosis not present

## 2016-04-06 DIAGNOSIS — Z992 Dependence on renal dialysis: Secondary | ICD-10-CM | POA: Diagnosis not present

## 2016-04-06 DIAGNOSIS — Z23 Encounter for immunization: Secondary | ICD-10-CM | POA: Diagnosis not present

## 2016-04-06 DIAGNOSIS — N2581 Secondary hyperparathyroidism of renal origin: Secondary | ICD-10-CM | POA: Diagnosis not present

## 2016-04-06 DIAGNOSIS — D631 Anemia in chronic kidney disease: Secondary | ICD-10-CM | POA: Diagnosis not present

## 2016-04-06 DIAGNOSIS — N186 End stage renal disease: Secondary | ICD-10-CM | POA: Diagnosis not present

## 2016-04-06 DIAGNOSIS — D509 Iron deficiency anemia, unspecified: Secondary | ICD-10-CM | POA: Diagnosis not present

## 2016-04-09 DIAGNOSIS — D509 Iron deficiency anemia, unspecified: Secondary | ICD-10-CM | POA: Diagnosis not present

## 2016-04-09 DIAGNOSIS — D631 Anemia in chronic kidney disease: Secondary | ICD-10-CM | POA: Diagnosis not present

## 2016-04-09 DIAGNOSIS — N2581 Secondary hyperparathyroidism of renal origin: Secondary | ICD-10-CM | POA: Diagnosis not present

## 2016-04-09 DIAGNOSIS — Z992 Dependence on renal dialysis: Secondary | ICD-10-CM | POA: Diagnosis not present

## 2016-04-09 DIAGNOSIS — N186 End stage renal disease: Secondary | ICD-10-CM | POA: Diagnosis not present

## 2016-04-11 DIAGNOSIS — N186 End stage renal disease: Secondary | ICD-10-CM | POA: Diagnosis not present

## 2016-04-11 DIAGNOSIS — D509 Iron deficiency anemia, unspecified: Secondary | ICD-10-CM | POA: Diagnosis not present

## 2016-04-11 DIAGNOSIS — Z992 Dependence on renal dialysis: Secondary | ICD-10-CM | POA: Diagnosis not present

## 2016-04-11 DIAGNOSIS — D631 Anemia in chronic kidney disease: Secondary | ICD-10-CM | POA: Diagnosis not present

## 2016-04-11 DIAGNOSIS — N2581 Secondary hyperparathyroidism of renal origin: Secondary | ICD-10-CM | POA: Diagnosis not present

## 2016-04-13 DIAGNOSIS — N2581 Secondary hyperparathyroidism of renal origin: Secondary | ICD-10-CM | POA: Diagnosis not present

## 2016-04-13 DIAGNOSIS — Z992 Dependence on renal dialysis: Secondary | ICD-10-CM | POA: Diagnosis not present

## 2016-04-13 DIAGNOSIS — D509 Iron deficiency anemia, unspecified: Secondary | ICD-10-CM | POA: Diagnosis not present

## 2016-04-13 DIAGNOSIS — D631 Anemia in chronic kidney disease: Secondary | ICD-10-CM | POA: Diagnosis not present

## 2016-04-13 DIAGNOSIS — N186 End stage renal disease: Secondary | ICD-10-CM | POA: Diagnosis not present

## 2016-04-16 DIAGNOSIS — N2581 Secondary hyperparathyroidism of renal origin: Secondary | ICD-10-CM | POA: Diagnosis not present

## 2016-04-16 DIAGNOSIS — Z992 Dependence on renal dialysis: Secondary | ICD-10-CM | POA: Diagnosis not present

## 2016-04-16 DIAGNOSIS — D631 Anemia in chronic kidney disease: Secondary | ICD-10-CM | POA: Diagnosis not present

## 2016-04-16 DIAGNOSIS — N186 End stage renal disease: Secondary | ICD-10-CM | POA: Diagnosis not present

## 2016-04-16 DIAGNOSIS — D509 Iron deficiency anemia, unspecified: Secondary | ICD-10-CM | POA: Diagnosis not present

## 2016-04-18 DIAGNOSIS — N2581 Secondary hyperparathyroidism of renal origin: Secondary | ICD-10-CM | POA: Diagnosis not present

## 2016-04-18 DIAGNOSIS — D509 Iron deficiency anemia, unspecified: Secondary | ICD-10-CM | POA: Diagnosis not present

## 2016-04-18 DIAGNOSIS — D631 Anemia in chronic kidney disease: Secondary | ICD-10-CM | POA: Diagnosis not present

## 2016-04-18 DIAGNOSIS — Z992 Dependence on renal dialysis: Secondary | ICD-10-CM | POA: Diagnosis not present

## 2016-04-18 DIAGNOSIS — N186 End stage renal disease: Secondary | ICD-10-CM | POA: Diagnosis not present

## 2016-04-20 DIAGNOSIS — D509 Iron deficiency anemia, unspecified: Secondary | ICD-10-CM | POA: Diagnosis not present

## 2016-04-20 DIAGNOSIS — N2581 Secondary hyperparathyroidism of renal origin: Secondary | ICD-10-CM | POA: Diagnosis not present

## 2016-04-20 DIAGNOSIS — Z992 Dependence on renal dialysis: Secondary | ICD-10-CM | POA: Diagnosis not present

## 2016-04-20 DIAGNOSIS — N186 End stage renal disease: Secondary | ICD-10-CM | POA: Diagnosis not present

## 2016-04-20 DIAGNOSIS — D631 Anemia in chronic kidney disease: Secondary | ICD-10-CM | POA: Diagnosis not present

## 2016-04-23 DIAGNOSIS — N2581 Secondary hyperparathyroidism of renal origin: Secondary | ICD-10-CM | POA: Diagnosis not present

## 2016-04-23 DIAGNOSIS — D509 Iron deficiency anemia, unspecified: Secondary | ICD-10-CM | POA: Diagnosis not present

## 2016-04-23 DIAGNOSIS — N186 End stage renal disease: Secondary | ICD-10-CM | POA: Diagnosis not present

## 2016-04-23 DIAGNOSIS — Z992 Dependence on renal dialysis: Secondary | ICD-10-CM | POA: Diagnosis not present

## 2016-04-23 DIAGNOSIS — D631 Anemia in chronic kidney disease: Secondary | ICD-10-CM | POA: Diagnosis not present

## 2016-04-25 DIAGNOSIS — Z992 Dependence on renal dialysis: Secondary | ICD-10-CM | POA: Diagnosis not present

## 2016-04-25 DIAGNOSIS — D509 Iron deficiency anemia, unspecified: Secondary | ICD-10-CM | POA: Diagnosis not present

## 2016-04-25 DIAGNOSIS — N186 End stage renal disease: Secondary | ICD-10-CM | POA: Diagnosis not present

## 2016-04-25 DIAGNOSIS — D631 Anemia in chronic kidney disease: Secondary | ICD-10-CM | POA: Diagnosis not present

## 2016-04-25 DIAGNOSIS — N2581 Secondary hyperparathyroidism of renal origin: Secondary | ICD-10-CM | POA: Diagnosis not present

## 2016-04-27 DIAGNOSIS — N2581 Secondary hyperparathyroidism of renal origin: Secondary | ICD-10-CM | POA: Diagnosis not present

## 2016-04-27 DIAGNOSIS — N186 End stage renal disease: Secondary | ICD-10-CM | POA: Diagnosis not present

## 2016-04-27 DIAGNOSIS — D509 Iron deficiency anemia, unspecified: Secondary | ICD-10-CM | POA: Diagnosis not present

## 2016-04-27 DIAGNOSIS — Z992 Dependence on renal dialysis: Secondary | ICD-10-CM | POA: Diagnosis not present

## 2016-04-27 DIAGNOSIS — D631 Anemia in chronic kidney disease: Secondary | ICD-10-CM | POA: Diagnosis not present

## 2016-04-30 ENCOUNTER — Other Ambulatory Visit (INDEPENDENT_AMBULATORY_CARE_PROVIDER_SITE_OTHER): Payer: Self-pay | Admitting: Vascular Surgery

## 2016-04-30 DIAGNOSIS — T829XXA Unspecified complication of cardiac and vascular prosthetic device, implant and graft, initial encounter: Secondary | ICD-10-CM

## 2016-04-30 DIAGNOSIS — D631 Anemia in chronic kidney disease: Secondary | ICD-10-CM | POA: Diagnosis not present

## 2016-04-30 DIAGNOSIS — N186 End stage renal disease: Secondary | ICD-10-CM | POA: Diagnosis not present

## 2016-04-30 DIAGNOSIS — D509 Iron deficiency anemia, unspecified: Secondary | ICD-10-CM | POA: Diagnosis not present

## 2016-04-30 DIAGNOSIS — N2581 Secondary hyperparathyroidism of renal origin: Secondary | ICD-10-CM | POA: Diagnosis not present

## 2016-04-30 DIAGNOSIS — Z992 Dependence on renal dialysis: Secondary | ICD-10-CM

## 2016-05-01 ENCOUNTER — Encounter (INDEPENDENT_AMBULATORY_CARE_PROVIDER_SITE_OTHER): Payer: Self-pay | Admitting: Vascular Surgery

## 2016-05-01 ENCOUNTER — Ambulatory Visit (INDEPENDENT_AMBULATORY_CARE_PROVIDER_SITE_OTHER): Payer: Medicare Other

## 2016-05-01 ENCOUNTER — Ambulatory Visit (INDEPENDENT_AMBULATORY_CARE_PROVIDER_SITE_OTHER): Payer: Medicare Other | Admitting: Vascular Surgery

## 2016-05-01 VITALS — BP 178/87 | HR 82 | Resp 16 | Ht <= 58 in | Wt 112.0 lb

## 2016-05-01 DIAGNOSIS — N186 End stage renal disease: Secondary | ICD-10-CM | POA: Insufficient documentation

## 2016-05-01 DIAGNOSIS — T829XXD Unspecified complication of cardiac and vascular prosthetic device, implant and graft, subsequent encounter: Secondary | ICD-10-CM

## 2016-05-01 DIAGNOSIS — I159 Secondary hypertension, unspecified: Secondary | ICD-10-CM

## 2016-05-01 DIAGNOSIS — Z992 Dependence on renal dialysis: Secondary | ICD-10-CM

## 2016-05-01 DIAGNOSIS — I10 Essential (primary) hypertension: Secondary | ICD-10-CM | POA: Insufficient documentation

## 2016-05-01 DIAGNOSIS — T829XXA Unspecified complication of cardiac and vascular prosthetic device, implant and graft, initial encounter: Secondary | ICD-10-CM | POA: Insufficient documentation

## 2016-05-01 NOTE — Progress Notes (Signed)
Subjective:    Patient ID: Robin Sanchez, female    DOB: Apr 10, 1966, 50 y.o.   MRN: [id number] Chief Complaint  Patient presents with  . Follow-up   Patient presents for a four month hemodialysis access follow up. The patient underwent a duplex ultrasound of the access which was notable for a patent graft without any significant hemodynamic stenosis. The patient denies any issues with hemodialysis such as cannulation problems, increased bleeding, decrease in doppler flow or recirculation. The patient also denies any graft skin breakdown, pain, edema, pallor or ulceration of the arm / hand.      Review of Systems  Constitutional: Negative.   HENT: Negative.   Eyes: Negative.   Respiratory: Negative.   Cardiovascular: Negative.   Gastrointestinal: Negative.   Endocrine: Negative.   Genitourinary:       ESRD  Musculoskeletal: Negative.   Skin: Negative.   Allergic/Immunologic: Negative.   Neurological: Negative.   Hematological: Negative.   Psychiatric/Behavioral: Negative.        Objective:   Physical Exam  Constitutional: She is oriented to person, place, and time. She appears well-developed and well-nourished.  HENT:  Head: Normocephalic and atraumatic.  Eyes: Conjunctivae and EOM are normal. Pupils are equal, round, and reactive to light.  Neck: Normal range of motion.  Cardiovascular: Normal rate, regular rhythm, normal heart sounds and intact distal pulses.   Pulses:      Radial pulses are 2+ on the right side, and 2+ on the left side.       Dorsalis pedis pulses are 2+ on the right side, and 2+ on the left side.       Posterior tibial pulses are 2+ on the right side, and 2+ on the left side.  Left Upper Extremity Dialysis Access: Good bruit and thrill. No skin breakdown.   Pulmonary/Chest: Effort normal and breath sounds normal.  Abdominal: Soft. Bowel sounds are normal.  Musculoskeletal: Normal range of motion. She exhibits no edema.  Neurological: She is alert and  oriented to person, place, and time.  Skin: Skin is warm and dry.  Psychiatric: She has a normal mood and affect. Her behavior is normal. Judgment and thought content normal.   BP (!) 178/87   Pulse 82   Resp 16   Ht 4\' 8"  (1.422 m)   Wt 112 lb (50.8 kg)   BMI 25.11 kg/m   Past Medical History:  Diagnosis Date  . Anemia   . Cancer ([place])    cervical  . Chronic kidney disease    dialysis t,t,s  . Dysrhythmia   . GERD (gastroesophageal reflux disease)   . Hypertension   . Lupus    currently in remission  . Osteoporosis   . Peripheral vascular disease ([place])   . Shortness of breath dyspnea    occas   Social History   Social History  . Marital status: Married    Spouse name: N/A  . Number of children: N/A  . Years of education: N/A   Occupational History  . Not on file.   Social History Main Topics  . Smoking status: Never Smoker  . Smokeless tobacco: Never Used  . Alcohol use No  . Drug use: No  . Sexual activity: Not on file   Other Topics Concern  . Not on file   Social History Narrative  . No narrative on file   Past Surgical History:  Procedure Laterality Date  . ARTERIOVENOUS GRAFT PLACEMENT     currently dialysis  access in right thigh  . DILATION AND CURETTAGE OF UTERUS    . PERIPHERAL VASCULAR CATHETERIZATION N/A 11/13/2015   Procedure: A/V Shuntogram/Fistulagram;  Surgeon: Algernon Huxley, MD;  Location: [place] [facility];  Service: Cardiovascular;  Laterality: N/A;  . PERIPHERAL VASCULAR CATHETERIZATION  11/13/2015   Procedure: Dialysis/Perma Catheter Insertion;  Surgeon: Algernon Huxley, MD;  Location: [place] [facility];  Service: Cardiovascular;;  . PERIPHERAL VASCULAR CATHETERIZATION N/A 01/18/2016   Procedure: Dialysis/Perma Catheter Removal;  Surgeon: Algernon Huxley, MD;  Location: [place] [facility];  Service: Cardiovascular;  Laterality: N/A;  . REVISION OF ARTERIOVENOUS GORETEX GRAFT Left 11/23/2015   Procedure: REVISION OF ARTERIOVENOUS GORETEX  GRAFT;  Surgeon: Algernon Huxley, MD;  Location: [facility];  Service: Vascular;  Laterality: Left;  . STENTS     MULTIPLE DIALYSIS STENTS   No family history on file.  Allergies  Allergen Reactions  . Shellfish Allergy Shortness Of Breath and Swelling    Angioedema, throat swells closed  . Bleomycin   . Penicillins Nausea And Vomiting    Has patient had a PCN reaction causing immediate rash, facial/tongue/throat swelling, SOB or lightheadedness with hypotension: no Has patient had a PCN reaction causing severe rash involving mucus membranes or skin necrosis:no Has patient had a PCN reaction that required hospitalization no Has patient had a PCN reaction occurring within the last 10 years: no If all of the above answers are "NO", then may proceed with Cephalosporin use.  . Aspirin Rash  . Vancomycin Rash      Assessment & Plan:  Patient presents for a four month hemodialysis access follow up. The patient underwent a duplex ultrasound of the access which was notable for a patent graft without any significant hemodynamic stenosis. The patient denies any issues with hemodialysis such as cannulation problems, increased bleeding, decrease in doppler flow or recirculation. The patient also denies any graft skin breakdown, pain, edema, pallor or ulceration of the arm / hand.   1. ESRD on dialysis ([place]) - Stable Studies reviewed with patient. The patient is doing well and currently has adequate dialysis access. Duplex ultrasound of the AV access shows a patent access with no evidence of hemodynamically significant strictures or stenosis.  The patient should continue to have duplex ultrasounds of the dialysis access every six months. The patient was instructed to call the office in the interim if any issues with dialysis access / doppler flow, pain, edema, pallor, fistula skin breakdown or ulceration of the arm / hand occur. The patient expressed their understanding.  2. Secondary hypertension -  Stable Encouraged good control as its slows the progression of atherosclerotic disease  3. Complication from renal dialysis device, subsequent encounter - None The patient is doing well and currently has adequate dialysis access.  Current Outpatient Prescriptions on File Prior to Visit  Medication Sig Dispense Refill  . amLODipine (NORVASC) 10 MG tablet Take 10 mg by mouth daily.    . carvedilol (COREG) 25 MG tablet Take 25 mg by mouth 2 (two) times daily with a meal.    . cinacalcet (SENSIPAR) 60 MG tablet Take 60 mg by mouth daily.    . Ferric Citrate (AURYXIA) 1 GM 210 MG(Fe) TABS Take 2 tablets by mouth 3 (three) times daily before meals.     No current facility-administered medications on file prior to visit.     There are no Patient Instructions on file for this visit. No Follow-up on file.   Theta Leaf A  Pernie Grosso, PA-C

## 2016-05-02 DIAGNOSIS — D509 Iron deficiency anemia, unspecified: Secondary | ICD-10-CM | POA: Diagnosis not present

## 2016-05-02 DIAGNOSIS — N186 End stage renal disease: Secondary | ICD-10-CM | POA: Diagnosis not present

## 2016-05-02 DIAGNOSIS — D631 Anemia in chronic kidney disease: Secondary | ICD-10-CM | POA: Diagnosis not present

## 2016-05-02 DIAGNOSIS — N2581 Secondary hyperparathyroidism of renal origin: Secondary | ICD-10-CM | POA: Diagnosis not present

## 2016-05-02 DIAGNOSIS — Z992 Dependence on renal dialysis: Secondary | ICD-10-CM | POA: Diagnosis not present

## 2016-05-03 ENCOUNTER — Encounter (INDEPENDENT_AMBULATORY_CARE_PROVIDER_SITE_OTHER): Payer: Self-pay

## 2016-05-03 ENCOUNTER — Ambulatory Visit (INDEPENDENT_AMBULATORY_CARE_PROVIDER_SITE_OTHER): Payer: Self-pay | Admitting: Vascular Surgery

## 2016-05-04 DIAGNOSIS — N2581 Secondary hyperparathyroidism of renal origin: Secondary | ICD-10-CM | POA: Diagnosis not present

## 2016-05-04 DIAGNOSIS — Z992 Dependence on renal dialysis: Secondary | ICD-10-CM | POA: Diagnosis not present

## 2016-05-04 DIAGNOSIS — N186 End stage renal disease: Secondary | ICD-10-CM | POA: Diagnosis not present

## 2016-05-04 DIAGNOSIS — D631 Anemia in chronic kidney disease: Secondary | ICD-10-CM | POA: Diagnosis not present

## 2016-05-04 DIAGNOSIS — D509 Iron deficiency anemia, unspecified: Secondary | ICD-10-CM | POA: Diagnosis not present

## 2016-05-07 DIAGNOSIS — Z992 Dependence on renal dialysis: Secondary | ICD-10-CM | POA: Diagnosis not present

## 2016-05-07 DIAGNOSIS — N186 End stage renal disease: Secondary | ICD-10-CM | POA: Diagnosis not present

## 2016-05-08 DIAGNOSIS — N2581 Secondary hyperparathyroidism of renal origin: Secondary | ICD-10-CM | POA: Diagnosis not present

## 2016-05-08 DIAGNOSIS — N186 End stage renal disease: Secondary | ICD-10-CM | POA: Diagnosis not present

## 2016-05-08 DIAGNOSIS — D631 Anemia in chronic kidney disease: Secondary | ICD-10-CM | POA: Diagnosis not present

## 2016-05-08 DIAGNOSIS — Z992 Dependence on renal dialysis: Secondary | ICD-10-CM | POA: Diagnosis not present

## 2016-05-09 DIAGNOSIS — D631 Anemia in chronic kidney disease: Secondary | ICD-10-CM | POA: Diagnosis not present

## 2016-05-09 DIAGNOSIS — N186 End stage renal disease: Secondary | ICD-10-CM | POA: Diagnosis not present

## 2016-05-09 DIAGNOSIS — N2581 Secondary hyperparathyroidism of renal origin: Secondary | ICD-10-CM | POA: Diagnosis not present

## 2016-05-09 DIAGNOSIS — Z992 Dependence on renal dialysis: Secondary | ICD-10-CM | POA: Diagnosis not present

## 2016-05-11 DIAGNOSIS — N186 End stage renal disease: Secondary | ICD-10-CM | POA: Diagnosis not present

## 2016-05-11 DIAGNOSIS — N2581 Secondary hyperparathyroidism of renal origin: Secondary | ICD-10-CM | POA: Diagnosis not present

## 2016-05-11 DIAGNOSIS — Z992 Dependence on renal dialysis: Secondary | ICD-10-CM | POA: Diagnosis not present

## 2016-05-11 DIAGNOSIS — D631 Anemia in chronic kidney disease: Secondary | ICD-10-CM | POA: Diagnosis not present

## 2016-05-13 DIAGNOSIS — I313 Pericardial effusion (noninflammatory): Secondary | ICD-10-CM | POA: Diagnosis not present

## 2016-05-13 DIAGNOSIS — Z01818 Encounter for other preprocedural examination: Secondary | ICD-10-CM | POA: Diagnosis not present

## 2016-05-14 DIAGNOSIS — N186 End stage renal disease: Secondary | ICD-10-CM | POA: Diagnosis not present

## 2016-05-14 DIAGNOSIS — N2581 Secondary hyperparathyroidism of renal origin: Secondary | ICD-10-CM | POA: Diagnosis not present

## 2016-05-14 DIAGNOSIS — D631 Anemia in chronic kidney disease: Secondary | ICD-10-CM | POA: Diagnosis not present

## 2016-05-14 DIAGNOSIS — Z992 Dependence on renal dialysis: Secondary | ICD-10-CM | POA: Diagnosis not present

## 2016-05-16 DIAGNOSIS — N186 End stage renal disease: Secondary | ICD-10-CM | POA: Diagnosis not present

## 2016-05-16 DIAGNOSIS — Z992 Dependence on renal dialysis: Secondary | ICD-10-CM | POA: Diagnosis not present

## 2016-05-16 DIAGNOSIS — N2581 Secondary hyperparathyroidism of renal origin: Secondary | ICD-10-CM | POA: Diagnosis not present

## 2016-05-16 DIAGNOSIS — D631 Anemia in chronic kidney disease: Secondary | ICD-10-CM | POA: Diagnosis not present

## 2016-05-18 DIAGNOSIS — D631 Anemia in chronic kidney disease: Secondary | ICD-10-CM | POA: Diagnosis not present

## 2016-05-18 DIAGNOSIS — Z992 Dependence on renal dialysis: Secondary | ICD-10-CM | POA: Diagnosis not present

## 2016-05-18 DIAGNOSIS — N2581 Secondary hyperparathyroidism of renal origin: Secondary | ICD-10-CM | POA: Diagnosis not present

## 2016-05-18 DIAGNOSIS — N186 End stage renal disease: Secondary | ICD-10-CM | POA: Diagnosis not present

## 2016-05-21 DIAGNOSIS — D631 Anemia in chronic kidney disease: Secondary | ICD-10-CM | POA: Diagnosis not present

## 2016-05-21 DIAGNOSIS — N2581 Secondary hyperparathyroidism of renal origin: Secondary | ICD-10-CM | POA: Diagnosis not present

## 2016-05-21 DIAGNOSIS — Z992 Dependence on renal dialysis: Secondary | ICD-10-CM | POA: Diagnosis not present

## 2016-05-21 DIAGNOSIS — N186 End stage renal disease: Secondary | ICD-10-CM | POA: Diagnosis not present

## 2016-05-23 DIAGNOSIS — N2581 Secondary hyperparathyroidism of renal origin: Secondary | ICD-10-CM | POA: Diagnosis not present

## 2016-05-23 DIAGNOSIS — Z992 Dependence on renal dialysis: Secondary | ICD-10-CM | POA: Diagnosis not present

## 2016-05-23 DIAGNOSIS — D631 Anemia in chronic kidney disease: Secondary | ICD-10-CM | POA: Diagnosis not present

## 2016-05-23 DIAGNOSIS — N186 End stage renal disease: Secondary | ICD-10-CM | POA: Diagnosis not present

## 2016-05-25 DIAGNOSIS — Z992 Dependence on renal dialysis: Secondary | ICD-10-CM | POA: Diagnosis not present

## 2016-05-25 DIAGNOSIS — D631 Anemia in chronic kidney disease: Secondary | ICD-10-CM | POA: Diagnosis not present

## 2016-05-25 DIAGNOSIS — N2581 Secondary hyperparathyroidism of renal origin: Secondary | ICD-10-CM | POA: Diagnosis not present

## 2016-05-25 DIAGNOSIS — N186 End stage renal disease: Secondary | ICD-10-CM | POA: Diagnosis not present

## 2016-05-27 DIAGNOSIS — N186 End stage renal disease: Secondary | ICD-10-CM | POA: Diagnosis not present

## 2016-05-27 DIAGNOSIS — N2581 Secondary hyperparathyroidism of renal origin: Secondary | ICD-10-CM | POA: Diagnosis not present

## 2016-05-27 DIAGNOSIS — D631 Anemia in chronic kidney disease: Secondary | ICD-10-CM | POA: Diagnosis not present

## 2016-05-27 DIAGNOSIS — Z992 Dependence on renal dialysis: Secondary | ICD-10-CM | POA: Diagnosis not present

## 2016-05-29 DIAGNOSIS — D631 Anemia in chronic kidney disease: Secondary | ICD-10-CM | POA: Diagnosis not present

## 2016-05-29 DIAGNOSIS — N2581 Secondary hyperparathyroidism of renal origin: Secondary | ICD-10-CM | POA: Diagnosis not present

## 2016-05-29 DIAGNOSIS — N186 End stage renal disease: Secondary | ICD-10-CM | POA: Diagnosis not present

## 2016-05-29 DIAGNOSIS — Z992 Dependence on renal dialysis: Secondary | ICD-10-CM | POA: Diagnosis not present

## 2016-06-01 DIAGNOSIS — Z992 Dependence on renal dialysis: Secondary | ICD-10-CM | POA: Diagnosis not present

## 2016-06-01 DIAGNOSIS — N2581 Secondary hyperparathyroidism of renal origin: Secondary | ICD-10-CM | POA: Diagnosis not present

## 2016-06-01 DIAGNOSIS — D631 Anemia in chronic kidney disease: Secondary | ICD-10-CM | POA: Diagnosis not present

## 2016-06-01 DIAGNOSIS — N186 End stage renal disease: Secondary | ICD-10-CM | POA: Diagnosis not present

## 2016-06-04 DIAGNOSIS — N2581 Secondary hyperparathyroidism of renal origin: Secondary | ICD-10-CM | POA: Diagnosis not present

## 2016-06-04 DIAGNOSIS — Z992 Dependence on renal dialysis: Secondary | ICD-10-CM | POA: Diagnosis not present

## 2016-06-04 DIAGNOSIS — N186 End stage renal disease: Secondary | ICD-10-CM | POA: Diagnosis not present

## 2016-06-04 DIAGNOSIS — D631 Anemia in chronic kidney disease: Secondary | ICD-10-CM | POA: Diagnosis not present

## 2016-06-06 DIAGNOSIS — Z992 Dependence on renal dialysis: Secondary | ICD-10-CM | POA: Diagnosis not present

## 2016-06-06 DIAGNOSIS — N2581 Secondary hyperparathyroidism of renal origin: Secondary | ICD-10-CM | POA: Diagnosis not present

## 2016-06-06 DIAGNOSIS — N186 End stage renal disease: Secondary | ICD-10-CM | POA: Diagnosis not present

## 2016-06-06 DIAGNOSIS — D631 Anemia in chronic kidney disease: Secondary | ICD-10-CM | POA: Diagnosis not present

## 2016-06-08 DIAGNOSIS — N2581 Secondary hyperparathyroidism of renal origin: Secondary | ICD-10-CM | POA: Diagnosis not present

## 2016-06-08 DIAGNOSIS — N186 End stage renal disease: Secondary | ICD-10-CM | POA: Diagnosis not present

## 2016-06-08 DIAGNOSIS — Z992 Dependence on renal dialysis: Secondary | ICD-10-CM | POA: Diagnosis not present

## 2016-06-08 DIAGNOSIS — D631 Anemia in chronic kidney disease: Secondary | ICD-10-CM | POA: Diagnosis not present

## 2016-06-11 DIAGNOSIS — Z992 Dependence on renal dialysis: Secondary | ICD-10-CM | POA: Diagnosis not present

## 2016-06-11 DIAGNOSIS — N2581 Secondary hyperparathyroidism of renal origin: Secondary | ICD-10-CM | POA: Diagnosis not present

## 2016-06-11 DIAGNOSIS — N186 End stage renal disease: Secondary | ICD-10-CM | POA: Diagnosis not present

## 2016-06-11 DIAGNOSIS — D631 Anemia in chronic kidney disease: Secondary | ICD-10-CM | POA: Diagnosis not present

## 2016-06-13 DIAGNOSIS — N186 End stage renal disease: Secondary | ICD-10-CM | POA: Diagnosis not present

## 2016-06-13 DIAGNOSIS — D631 Anemia in chronic kidney disease: Secondary | ICD-10-CM | POA: Diagnosis not present

## 2016-06-13 DIAGNOSIS — N2581 Secondary hyperparathyroidism of renal origin: Secondary | ICD-10-CM | POA: Diagnosis not present

## 2016-06-13 DIAGNOSIS — Z992 Dependence on renal dialysis: Secondary | ICD-10-CM | POA: Diagnosis not present

## 2016-06-15 DIAGNOSIS — D631 Anemia in chronic kidney disease: Secondary | ICD-10-CM | POA: Diagnosis not present

## 2016-06-15 DIAGNOSIS — N186 End stage renal disease: Secondary | ICD-10-CM | POA: Diagnosis not present

## 2016-06-15 DIAGNOSIS — N2581 Secondary hyperparathyroidism of renal origin: Secondary | ICD-10-CM | POA: Diagnosis not present

## 2016-06-15 DIAGNOSIS — Z992 Dependence on renal dialysis: Secondary | ICD-10-CM | POA: Diagnosis not present

## 2016-06-18 DIAGNOSIS — N186 End stage renal disease: Secondary | ICD-10-CM | POA: Diagnosis not present

## 2016-06-18 DIAGNOSIS — N2581 Secondary hyperparathyroidism of renal origin: Secondary | ICD-10-CM | POA: Diagnosis not present

## 2016-06-18 DIAGNOSIS — D631 Anemia in chronic kidney disease: Secondary | ICD-10-CM | POA: Diagnosis not present

## 2016-06-18 DIAGNOSIS — Z992 Dependence on renal dialysis: Secondary | ICD-10-CM | POA: Diagnosis not present

## 2016-06-20 DIAGNOSIS — N186 End stage renal disease: Secondary | ICD-10-CM | POA: Diagnosis not present

## 2016-06-20 DIAGNOSIS — N2581 Secondary hyperparathyroidism of renal origin: Secondary | ICD-10-CM | POA: Diagnosis not present

## 2016-06-20 DIAGNOSIS — Z992 Dependence on renal dialysis: Secondary | ICD-10-CM | POA: Diagnosis not present

## 2016-06-20 DIAGNOSIS — D631 Anemia in chronic kidney disease: Secondary | ICD-10-CM | POA: Diagnosis not present

## 2016-06-22 DIAGNOSIS — N186 End stage renal disease: Secondary | ICD-10-CM | POA: Diagnosis not present

## 2016-06-22 DIAGNOSIS — D631 Anemia in chronic kidney disease: Secondary | ICD-10-CM | POA: Diagnosis not present

## 2016-06-22 DIAGNOSIS — Z992 Dependence on renal dialysis: Secondary | ICD-10-CM | POA: Diagnosis not present

## 2016-06-22 DIAGNOSIS — N2581 Secondary hyperparathyroidism of renal origin: Secondary | ICD-10-CM | POA: Diagnosis not present

## 2016-06-24 DIAGNOSIS — N2581 Secondary hyperparathyroidism of renal origin: Secondary | ICD-10-CM | POA: Diagnosis not present

## 2016-06-24 DIAGNOSIS — N186 End stage renal disease: Secondary | ICD-10-CM | POA: Diagnosis not present

## 2016-06-24 DIAGNOSIS — Z992 Dependence on renal dialysis: Secondary | ICD-10-CM | POA: Diagnosis not present

## 2016-06-24 DIAGNOSIS — D631 Anemia in chronic kidney disease: Secondary | ICD-10-CM | POA: Diagnosis not present

## 2016-06-26 DIAGNOSIS — N2581 Secondary hyperparathyroidism of renal origin: Secondary | ICD-10-CM | POA: Diagnosis not present

## 2016-06-26 DIAGNOSIS — Z992 Dependence on renal dialysis: Secondary | ICD-10-CM | POA: Diagnosis not present

## 2016-06-26 DIAGNOSIS — N186 End stage renal disease: Secondary | ICD-10-CM | POA: Diagnosis not present

## 2016-06-26 DIAGNOSIS — D631 Anemia in chronic kidney disease: Secondary | ICD-10-CM | POA: Diagnosis not present

## 2016-06-29 DIAGNOSIS — D631 Anemia in chronic kidney disease: Secondary | ICD-10-CM | POA: Diagnosis not present

## 2016-06-29 DIAGNOSIS — N186 End stage renal disease: Secondary | ICD-10-CM | POA: Diagnosis not present

## 2016-06-29 DIAGNOSIS — N2581 Secondary hyperparathyroidism of renal origin: Secondary | ICD-10-CM | POA: Diagnosis not present

## 2016-06-29 DIAGNOSIS — Z992 Dependence on renal dialysis: Secondary | ICD-10-CM | POA: Diagnosis not present

## 2016-07-02 DIAGNOSIS — Z992 Dependence on renal dialysis: Secondary | ICD-10-CM | POA: Diagnosis not present

## 2016-07-02 DIAGNOSIS — D631 Anemia in chronic kidney disease: Secondary | ICD-10-CM | POA: Diagnosis not present

## 2016-07-02 DIAGNOSIS — N186 End stage renal disease: Secondary | ICD-10-CM | POA: Diagnosis not present

## 2016-07-02 DIAGNOSIS — N2581 Secondary hyperparathyroidism of renal origin: Secondary | ICD-10-CM | POA: Diagnosis not present

## 2016-07-04 DIAGNOSIS — Z992 Dependence on renal dialysis: Secondary | ICD-10-CM | POA: Diagnosis not present

## 2016-07-04 DIAGNOSIS — D631 Anemia in chronic kidney disease: Secondary | ICD-10-CM | POA: Diagnosis not present

## 2016-07-04 DIAGNOSIS — N186 End stage renal disease: Secondary | ICD-10-CM | POA: Diagnosis not present

## 2016-07-04 DIAGNOSIS — N2581 Secondary hyperparathyroidism of renal origin: Secondary | ICD-10-CM | POA: Diagnosis not present

## 2016-07-06 DIAGNOSIS — N2581 Secondary hyperparathyroidism of renal origin: Secondary | ICD-10-CM | POA: Diagnosis not present

## 2016-07-06 DIAGNOSIS — Z992 Dependence on renal dialysis: Secondary | ICD-10-CM | POA: Diagnosis not present

## 2016-07-06 DIAGNOSIS — D631 Anemia in chronic kidney disease: Secondary | ICD-10-CM | POA: Diagnosis not present

## 2016-07-06 DIAGNOSIS — N186 End stage renal disease: Secondary | ICD-10-CM | POA: Diagnosis not present

## 2016-07-07 DIAGNOSIS — Z992 Dependence on renal dialysis: Secondary | ICD-10-CM | POA: Diagnosis not present

## 2016-07-07 DIAGNOSIS — N186 End stage renal disease: Secondary | ICD-10-CM | POA: Diagnosis not present

## 2016-07-09 DIAGNOSIS — Z992 Dependence on renal dialysis: Secondary | ICD-10-CM | POA: Diagnosis not present

## 2016-07-09 DIAGNOSIS — D631 Anemia in chronic kidney disease: Secondary | ICD-10-CM | POA: Diagnosis not present

## 2016-07-09 DIAGNOSIS — N2581 Secondary hyperparathyroidism of renal origin: Secondary | ICD-10-CM | POA: Diagnosis not present

## 2016-07-09 DIAGNOSIS — N186 End stage renal disease: Secondary | ICD-10-CM | POA: Diagnosis not present

## 2016-07-11 DIAGNOSIS — Z992 Dependence on renal dialysis: Secondary | ICD-10-CM | POA: Diagnosis not present

## 2016-07-11 DIAGNOSIS — N186 End stage renal disease: Secondary | ICD-10-CM | POA: Diagnosis not present

## 2016-07-11 DIAGNOSIS — D631 Anemia in chronic kidney disease: Secondary | ICD-10-CM | POA: Diagnosis not present

## 2016-07-11 DIAGNOSIS — N2581 Secondary hyperparathyroidism of renal origin: Secondary | ICD-10-CM | POA: Diagnosis not present

## 2016-07-13 DIAGNOSIS — Z992 Dependence on renal dialysis: Secondary | ICD-10-CM | POA: Diagnosis not present

## 2016-07-13 DIAGNOSIS — D631 Anemia in chronic kidney disease: Secondary | ICD-10-CM | POA: Diagnosis not present

## 2016-07-13 DIAGNOSIS — N186 End stage renal disease: Secondary | ICD-10-CM | POA: Diagnosis not present

## 2016-07-13 DIAGNOSIS — N2581 Secondary hyperparathyroidism of renal origin: Secondary | ICD-10-CM | POA: Diagnosis not present

## 2016-07-16 DIAGNOSIS — Z992 Dependence on renal dialysis: Secondary | ICD-10-CM | POA: Diagnosis not present

## 2016-07-16 DIAGNOSIS — N2581 Secondary hyperparathyroidism of renal origin: Secondary | ICD-10-CM | POA: Diagnosis not present

## 2016-07-16 DIAGNOSIS — N186 End stage renal disease: Secondary | ICD-10-CM | POA: Diagnosis not present

## 2016-07-16 DIAGNOSIS — D631 Anemia in chronic kidney disease: Secondary | ICD-10-CM | POA: Diagnosis not present

## 2016-07-18 DIAGNOSIS — N186 End stage renal disease: Secondary | ICD-10-CM | POA: Diagnosis not present

## 2016-07-18 DIAGNOSIS — Z992 Dependence on renal dialysis: Secondary | ICD-10-CM | POA: Diagnosis not present

## 2016-07-18 DIAGNOSIS — D631 Anemia in chronic kidney disease: Secondary | ICD-10-CM | POA: Diagnosis not present

## 2016-07-18 DIAGNOSIS — N2581 Secondary hyperparathyroidism of renal origin: Secondary | ICD-10-CM | POA: Diagnosis not present

## 2016-07-20 DIAGNOSIS — N2581 Secondary hyperparathyroidism of renal origin: Secondary | ICD-10-CM | POA: Diagnosis not present

## 2016-07-20 DIAGNOSIS — N186 End stage renal disease: Secondary | ICD-10-CM | POA: Diagnosis not present

## 2016-07-20 DIAGNOSIS — D631 Anemia in chronic kidney disease: Secondary | ICD-10-CM | POA: Diagnosis not present

## 2016-07-20 DIAGNOSIS — Z992 Dependence on renal dialysis: Secondary | ICD-10-CM | POA: Diagnosis not present

## 2016-07-23 DIAGNOSIS — D631 Anemia in chronic kidney disease: Secondary | ICD-10-CM | POA: Diagnosis not present

## 2016-07-23 DIAGNOSIS — N2581 Secondary hyperparathyroidism of renal origin: Secondary | ICD-10-CM | POA: Diagnosis not present

## 2016-07-23 DIAGNOSIS — Z992 Dependence on renal dialysis: Secondary | ICD-10-CM | POA: Diagnosis not present

## 2016-07-23 DIAGNOSIS — N186 End stage renal disease: Secondary | ICD-10-CM | POA: Diagnosis not present

## 2016-07-25 DIAGNOSIS — N2581 Secondary hyperparathyroidism of renal origin: Secondary | ICD-10-CM | POA: Diagnosis not present

## 2016-07-25 DIAGNOSIS — Z992 Dependence on renal dialysis: Secondary | ICD-10-CM | POA: Diagnosis not present

## 2016-07-25 DIAGNOSIS — D631 Anemia in chronic kidney disease: Secondary | ICD-10-CM | POA: Diagnosis not present

## 2016-07-25 DIAGNOSIS — N186 End stage renal disease: Secondary | ICD-10-CM | POA: Diagnosis not present

## 2016-07-26 DIAGNOSIS — N83201 Unspecified ovarian cyst, right side: Secondary | ICD-10-CM | POA: Diagnosis not present

## 2016-07-26 DIAGNOSIS — N858 Other specified noninflammatory disorders of uterus: Secondary | ICD-10-CM | POA: Diagnosis not present

## 2016-07-27 DIAGNOSIS — N2581 Secondary hyperparathyroidism of renal origin: Secondary | ICD-10-CM | POA: Diagnosis not present

## 2016-07-27 DIAGNOSIS — D631 Anemia in chronic kidney disease: Secondary | ICD-10-CM | POA: Diagnosis not present

## 2016-07-27 DIAGNOSIS — Z992 Dependence on renal dialysis: Secondary | ICD-10-CM | POA: Diagnosis not present

## 2016-07-27 DIAGNOSIS — N186 End stage renal disease: Secondary | ICD-10-CM | POA: Diagnosis not present

## 2016-07-30 DIAGNOSIS — Z992 Dependence on renal dialysis: Secondary | ICD-10-CM | POA: Diagnosis not present

## 2016-07-30 DIAGNOSIS — N2581 Secondary hyperparathyroidism of renal origin: Secondary | ICD-10-CM | POA: Diagnosis not present

## 2016-07-30 DIAGNOSIS — D631 Anemia in chronic kidney disease: Secondary | ICD-10-CM | POA: Diagnosis not present

## 2016-07-30 DIAGNOSIS — N186 End stage renal disease: Secondary | ICD-10-CM | POA: Diagnosis not present

## 2016-08-01 DIAGNOSIS — D631 Anemia in chronic kidney disease: Secondary | ICD-10-CM | POA: Diagnosis not present

## 2016-08-01 DIAGNOSIS — N2581 Secondary hyperparathyroidism of renal origin: Secondary | ICD-10-CM | POA: Diagnosis not present

## 2016-08-01 DIAGNOSIS — Z992 Dependence on renal dialysis: Secondary | ICD-10-CM | POA: Diagnosis not present

## 2016-08-01 DIAGNOSIS — N186 End stage renal disease: Secondary | ICD-10-CM | POA: Diagnosis not present

## 2016-08-03 DIAGNOSIS — N186 End stage renal disease: Secondary | ICD-10-CM | POA: Diagnosis not present

## 2016-08-03 DIAGNOSIS — Z992 Dependence on renal dialysis: Secondary | ICD-10-CM | POA: Diagnosis not present

## 2016-08-03 DIAGNOSIS — N2581 Secondary hyperparathyroidism of renal origin: Secondary | ICD-10-CM | POA: Diagnosis not present

## 2016-08-03 DIAGNOSIS — D631 Anemia in chronic kidney disease: Secondary | ICD-10-CM | POA: Diagnosis not present

## 2016-08-06 DIAGNOSIS — N2581 Secondary hyperparathyroidism of renal origin: Secondary | ICD-10-CM | POA: Diagnosis not present

## 2016-08-06 DIAGNOSIS — N186 End stage renal disease: Secondary | ICD-10-CM | POA: Diagnosis not present

## 2016-08-06 DIAGNOSIS — D631 Anemia in chronic kidney disease: Secondary | ICD-10-CM | POA: Diagnosis not present

## 2016-08-06 DIAGNOSIS — Z992 Dependence on renal dialysis: Secondary | ICD-10-CM | POA: Diagnosis not present

## 2016-08-07 DIAGNOSIS — Z992 Dependence on renal dialysis: Secondary | ICD-10-CM | POA: Diagnosis not present

## 2016-08-07 DIAGNOSIS — N186 End stage renal disease: Secondary | ICD-10-CM | POA: Diagnosis not present

## 2016-08-07 DIAGNOSIS — N83209 Unspecified ovarian cyst, unspecified side: Secondary | ICD-10-CM | POA: Diagnosis not present

## 2016-08-07 DIAGNOSIS — N83291 Other ovarian cyst, right side: Secondary | ICD-10-CM | POA: Diagnosis not present

## 2016-08-07 DIAGNOSIS — N83201 Unspecified ovarian cyst, right side: Secondary | ICD-10-CM | POA: Diagnosis not present

## 2016-08-08 DIAGNOSIS — N186 End stage renal disease: Secondary | ICD-10-CM | POA: Diagnosis not present

## 2016-08-08 DIAGNOSIS — D631 Anemia in chronic kidney disease: Secondary | ICD-10-CM | POA: Diagnosis not present

## 2016-08-08 DIAGNOSIS — D509 Iron deficiency anemia, unspecified: Secondary | ICD-10-CM | POA: Diagnosis not present

## 2016-08-08 DIAGNOSIS — N2581 Secondary hyperparathyroidism of renal origin: Secondary | ICD-10-CM | POA: Diagnosis not present

## 2016-08-08 DIAGNOSIS — Z992 Dependence on renal dialysis: Secondary | ICD-10-CM | POA: Diagnosis not present

## 2016-08-10 DIAGNOSIS — D509 Iron deficiency anemia, unspecified: Secondary | ICD-10-CM | POA: Diagnosis not present

## 2016-08-10 DIAGNOSIS — N2581 Secondary hyperparathyroidism of renal origin: Secondary | ICD-10-CM | POA: Diagnosis not present

## 2016-08-10 DIAGNOSIS — Z992 Dependence on renal dialysis: Secondary | ICD-10-CM | POA: Diagnosis not present

## 2016-08-10 DIAGNOSIS — D631 Anemia in chronic kidney disease: Secondary | ICD-10-CM | POA: Diagnosis not present

## 2016-08-10 DIAGNOSIS — N186 End stage renal disease: Secondary | ICD-10-CM | POA: Diagnosis not present

## 2016-08-13 DIAGNOSIS — D509 Iron deficiency anemia, unspecified: Secondary | ICD-10-CM | POA: Diagnosis not present

## 2016-08-13 DIAGNOSIS — N186 End stage renal disease: Secondary | ICD-10-CM | POA: Diagnosis not present

## 2016-08-13 DIAGNOSIS — Z992 Dependence on renal dialysis: Secondary | ICD-10-CM | POA: Diagnosis not present

## 2016-08-13 DIAGNOSIS — D631 Anemia in chronic kidney disease: Secondary | ICD-10-CM | POA: Diagnosis not present

## 2016-08-13 DIAGNOSIS — N2581 Secondary hyperparathyroidism of renal origin: Secondary | ICD-10-CM | POA: Diagnosis not present

## 2016-08-15 DIAGNOSIS — N2581 Secondary hyperparathyroidism of renal origin: Secondary | ICD-10-CM | POA: Diagnosis not present

## 2016-08-15 DIAGNOSIS — D631 Anemia in chronic kidney disease: Secondary | ICD-10-CM | POA: Diagnosis not present

## 2016-08-15 DIAGNOSIS — N186 End stage renal disease: Secondary | ICD-10-CM | POA: Diagnosis not present

## 2016-08-15 DIAGNOSIS — D509 Iron deficiency anemia, unspecified: Secondary | ICD-10-CM | POA: Diagnosis not present

## 2016-08-15 DIAGNOSIS — Z992 Dependence on renal dialysis: Secondary | ICD-10-CM | POA: Diagnosis not present

## 2016-08-17 DIAGNOSIS — D509 Iron deficiency anemia, unspecified: Secondary | ICD-10-CM | POA: Diagnosis not present

## 2016-08-17 DIAGNOSIS — D631 Anemia in chronic kidney disease: Secondary | ICD-10-CM | POA: Diagnosis not present

## 2016-08-17 DIAGNOSIS — Z992 Dependence on renal dialysis: Secondary | ICD-10-CM | POA: Diagnosis not present

## 2016-08-17 DIAGNOSIS — N2581 Secondary hyperparathyroidism of renal origin: Secondary | ICD-10-CM | POA: Diagnosis not present

## 2016-08-17 DIAGNOSIS — N186 End stage renal disease: Secondary | ICD-10-CM | POA: Diagnosis not present

## 2016-08-20 DIAGNOSIS — N2581 Secondary hyperparathyroidism of renal origin: Secondary | ICD-10-CM | POA: Diagnosis not present

## 2016-08-20 DIAGNOSIS — D631 Anemia in chronic kidney disease: Secondary | ICD-10-CM | POA: Diagnosis not present

## 2016-08-20 DIAGNOSIS — D509 Iron deficiency anemia, unspecified: Secondary | ICD-10-CM | POA: Diagnosis not present

## 2016-08-20 DIAGNOSIS — Z992 Dependence on renal dialysis: Secondary | ICD-10-CM | POA: Diagnosis not present

## 2016-08-20 DIAGNOSIS — N186 End stage renal disease: Secondary | ICD-10-CM | POA: Diagnosis not present

## 2016-08-22 DIAGNOSIS — D509 Iron deficiency anemia, unspecified: Secondary | ICD-10-CM | POA: Diagnosis not present

## 2016-08-22 DIAGNOSIS — N2581 Secondary hyperparathyroidism of renal origin: Secondary | ICD-10-CM | POA: Diagnosis not present

## 2016-08-22 DIAGNOSIS — D631 Anemia in chronic kidney disease: Secondary | ICD-10-CM | POA: Diagnosis not present

## 2016-08-22 DIAGNOSIS — Z992 Dependence on renal dialysis: Secondary | ICD-10-CM | POA: Diagnosis not present

## 2016-08-22 DIAGNOSIS — N186 End stage renal disease: Secondary | ICD-10-CM | POA: Diagnosis not present

## 2016-08-24 DIAGNOSIS — Z992 Dependence on renal dialysis: Secondary | ICD-10-CM | POA: Diagnosis not present

## 2016-08-24 DIAGNOSIS — N186 End stage renal disease: Secondary | ICD-10-CM | POA: Diagnosis not present

## 2016-08-24 DIAGNOSIS — D509 Iron deficiency anemia, unspecified: Secondary | ICD-10-CM | POA: Diagnosis not present

## 2016-08-24 DIAGNOSIS — D631 Anemia in chronic kidney disease: Secondary | ICD-10-CM | POA: Diagnosis not present

## 2016-08-24 DIAGNOSIS — N2581 Secondary hyperparathyroidism of renal origin: Secondary | ICD-10-CM | POA: Diagnosis not present

## 2016-08-27 DIAGNOSIS — N186 End stage renal disease: Secondary | ICD-10-CM | POA: Diagnosis not present

## 2016-08-27 DIAGNOSIS — Z992 Dependence on renal dialysis: Secondary | ICD-10-CM | POA: Diagnosis not present

## 2016-08-27 DIAGNOSIS — D631 Anemia in chronic kidney disease: Secondary | ICD-10-CM | POA: Diagnosis not present

## 2016-08-27 DIAGNOSIS — D509 Iron deficiency anemia, unspecified: Secondary | ICD-10-CM | POA: Diagnosis not present

## 2016-08-27 DIAGNOSIS — N2581 Secondary hyperparathyroidism of renal origin: Secondary | ICD-10-CM | POA: Diagnosis not present

## 2016-08-29 DIAGNOSIS — N2581 Secondary hyperparathyroidism of renal origin: Secondary | ICD-10-CM | POA: Diagnosis not present

## 2016-08-29 DIAGNOSIS — D631 Anemia in chronic kidney disease: Secondary | ICD-10-CM | POA: Diagnosis not present

## 2016-08-29 DIAGNOSIS — N186 End stage renal disease: Secondary | ICD-10-CM | POA: Diagnosis not present

## 2016-08-29 DIAGNOSIS — D509 Iron deficiency anemia, unspecified: Secondary | ICD-10-CM | POA: Diagnosis not present

## 2016-08-29 DIAGNOSIS — Z992 Dependence on renal dialysis: Secondary | ICD-10-CM | POA: Diagnosis not present

## 2016-08-31 DIAGNOSIS — N2581 Secondary hyperparathyroidism of renal origin: Secondary | ICD-10-CM | POA: Diagnosis not present

## 2016-08-31 DIAGNOSIS — D509 Iron deficiency anemia, unspecified: Secondary | ICD-10-CM | POA: Diagnosis not present

## 2016-08-31 DIAGNOSIS — Z992 Dependence on renal dialysis: Secondary | ICD-10-CM | POA: Diagnosis not present

## 2016-08-31 DIAGNOSIS — D631 Anemia in chronic kidney disease: Secondary | ICD-10-CM | POA: Diagnosis not present

## 2016-08-31 DIAGNOSIS — N186 End stage renal disease: Secondary | ICD-10-CM | POA: Diagnosis not present

## 2016-09-03 DIAGNOSIS — D631 Anemia in chronic kidney disease: Secondary | ICD-10-CM | POA: Diagnosis not present

## 2016-09-03 DIAGNOSIS — N186 End stage renal disease: Secondary | ICD-10-CM | POA: Diagnosis not present

## 2016-09-03 DIAGNOSIS — D509 Iron deficiency anemia, unspecified: Secondary | ICD-10-CM | POA: Diagnosis not present

## 2016-09-03 DIAGNOSIS — N2581 Secondary hyperparathyroidism of renal origin: Secondary | ICD-10-CM | POA: Diagnosis not present

## 2016-09-03 DIAGNOSIS — Z992 Dependence on renal dialysis: Secondary | ICD-10-CM | POA: Diagnosis not present

## 2016-09-04 DIAGNOSIS — Z992 Dependence on renal dialysis: Secondary | ICD-10-CM | POA: Diagnosis not present

## 2016-09-04 DIAGNOSIS — N186 End stage renal disease: Secondary | ICD-10-CM | POA: Diagnosis not present

## 2016-09-05 DIAGNOSIS — D509 Iron deficiency anemia, unspecified: Secondary | ICD-10-CM | POA: Diagnosis not present

## 2016-09-05 DIAGNOSIS — Z992 Dependence on renal dialysis: Secondary | ICD-10-CM | POA: Diagnosis not present

## 2016-09-05 DIAGNOSIS — N186 End stage renal disease: Secondary | ICD-10-CM | POA: Diagnosis not present

## 2016-09-05 DIAGNOSIS — N2581 Secondary hyperparathyroidism of renal origin: Secondary | ICD-10-CM | POA: Diagnosis not present

## 2016-09-05 DIAGNOSIS — D631 Anemia in chronic kidney disease: Secondary | ICD-10-CM | POA: Diagnosis not present

## 2016-09-07 DIAGNOSIS — N2581 Secondary hyperparathyroidism of renal origin: Secondary | ICD-10-CM | POA: Diagnosis not present

## 2016-09-07 DIAGNOSIS — N186 End stage renal disease: Secondary | ICD-10-CM | POA: Diagnosis not present

## 2016-09-07 DIAGNOSIS — Z992 Dependence on renal dialysis: Secondary | ICD-10-CM | POA: Diagnosis not present

## 2016-09-07 DIAGNOSIS — D631 Anemia in chronic kidney disease: Secondary | ICD-10-CM | POA: Diagnosis not present

## 2016-09-07 DIAGNOSIS — D509 Iron deficiency anemia, unspecified: Secondary | ICD-10-CM | POA: Diagnosis not present

## 2016-09-10 DIAGNOSIS — Z992 Dependence on renal dialysis: Secondary | ICD-10-CM | POA: Diagnosis not present

## 2016-09-10 DIAGNOSIS — N186 End stage renal disease: Secondary | ICD-10-CM | POA: Diagnosis not present

## 2016-09-10 DIAGNOSIS — N2581 Secondary hyperparathyroidism of renal origin: Secondary | ICD-10-CM | POA: Diagnosis not present

## 2016-09-10 DIAGNOSIS — D509 Iron deficiency anemia, unspecified: Secondary | ICD-10-CM | POA: Diagnosis not present

## 2016-09-10 DIAGNOSIS — D631 Anemia in chronic kidney disease: Secondary | ICD-10-CM | POA: Diagnosis not present

## 2016-09-12 DIAGNOSIS — D631 Anemia in chronic kidney disease: Secondary | ICD-10-CM | POA: Diagnosis not present

## 2016-09-12 DIAGNOSIS — D509 Iron deficiency anemia, unspecified: Secondary | ICD-10-CM | POA: Diagnosis not present

## 2016-09-12 DIAGNOSIS — Z992 Dependence on renal dialysis: Secondary | ICD-10-CM | POA: Diagnosis not present

## 2016-09-12 DIAGNOSIS — N186 End stage renal disease: Secondary | ICD-10-CM | POA: Diagnosis not present

## 2016-09-12 DIAGNOSIS — N2581 Secondary hyperparathyroidism of renal origin: Secondary | ICD-10-CM | POA: Diagnosis not present

## 2016-09-14 DIAGNOSIS — N2581 Secondary hyperparathyroidism of renal origin: Secondary | ICD-10-CM | POA: Diagnosis not present

## 2016-09-14 DIAGNOSIS — D509 Iron deficiency anemia, unspecified: Secondary | ICD-10-CM | POA: Diagnosis not present

## 2016-09-14 DIAGNOSIS — Z992 Dependence on renal dialysis: Secondary | ICD-10-CM | POA: Diagnosis not present

## 2016-09-14 DIAGNOSIS — N186 End stage renal disease: Secondary | ICD-10-CM | POA: Diagnosis not present

## 2016-09-14 DIAGNOSIS — D631 Anemia in chronic kidney disease: Secondary | ICD-10-CM | POA: Diagnosis not present

## 2016-09-17 DIAGNOSIS — N2581 Secondary hyperparathyroidism of renal origin: Secondary | ICD-10-CM | POA: Diagnosis not present

## 2016-09-17 DIAGNOSIS — N186 End stage renal disease: Secondary | ICD-10-CM | POA: Diagnosis not present

## 2016-09-17 DIAGNOSIS — D631 Anemia in chronic kidney disease: Secondary | ICD-10-CM | POA: Diagnosis not present

## 2016-09-17 DIAGNOSIS — Z992 Dependence on renal dialysis: Secondary | ICD-10-CM | POA: Diagnosis not present

## 2016-09-17 DIAGNOSIS — D509 Iron deficiency anemia, unspecified: Secondary | ICD-10-CM | POA: Diagnosis not present

## 2016-09-19 DIAGNOSIS — N186 End stage renal disease: Secondary | ICD-10-CM | POA: Diagnosis not present

## 2016-09-19 DIAGNOSIS — D509 Iron deficiency anemia, unspecified: Secondary | ICD-10-CM | POA: Diagnosis not present

## 2016-09-19 DIAGNOSIS — D631 Anemia in chronic kidney disease: Secondary | ICD-10-CM | POA: Diagnosis not present

## 2016-09-19 DIAGNOSIS — Z992 Dependence on renal dialysis: Secondary | ICD-10-CM | POA: Diagnosis not present

## 2016-09-19 DIAGNOSIS — N2581 Secondary hyperparathyroidism of renal origin: Secondary | ICD-10-CM | POA: Diagnosis not present

## 2016-09-21 DIAGNOSIS — N2581 Secondary hyperparathyroidism of renal origin: Secondary | ICD-10-CM | POA: Diagnosis not present

## 2016-09-21 DIAGNOSIS — Z992 Dependence on renal dialysis: Secondary | ICD-10-CM | POA: Diagnosis not present

## 2016-09-21 DIAGNOSIS — D631 Anemia in chronic kidney disease: Secondary | ICD-10-CM | POA: Diagnosis not present

## 2016-09-21 DIAGNOSIS — D509 Iron deficiency anemia, unspecified: Secondary | ICD-10-CM | POA: Diagnosis not present

## 2016-09-21 DIAGNOSIS — N186 End stage renal disease: Secondary | ICD-10-CM | POA: Diagnosis not present

## 2016-09-24 DIAGNOSIS — N2581 Secondary hyperparathyroidism of renal origin: Secondary | ICD-10-CM | POA: Diagnosis not present

## 2016-09-24 DIAGNOSIS — N186 End stage renal disease: Secondary | ICD-10-CM | POA: Diagnosis not present

## 2016-09-24 DIAGNOSIS — Z992 Dependence on renal dialysis: Secondary | ICD-10-CM | POA: Diagnosis not present

## 2016-09-24 DIAGNOSIS — D509 Iron deficiency anemia, unspecified: Secondary | ICD-10-CM | POA: Diagnosis not present

## 2016-09-24 DIAGNOSIS — D631 Anemia in chronic kidney disease: Secondary | ICD-10-CM | POA: Diagnosis not present

## 2016-09-26 DIAGNOSIS — D631 Anemia in chronic kidney disease: Secondary | ICD-10-CM | POA: Diagnosis not present

## 2016-09-26 DIAGNOSIS — N2581 Secondary hyperparathyroidism of renal origin: Secondary | ICD-10-CM | POA: Diagnosis not present

## 2016-09-26 DIAGNOSIS — N186 End stage renal disease: Secondary | ICD-10-CM | POA: Diagnosis not present

## 2016-09-26 DIAGNOSIS — D509 Iron deficiency anemia, unspecified: Secondary | ICD-10-CM | POA: Diagnosis not present

## 2016-09-26 DIAGNOSIS — Z992 Dependence on renal dialysis: Secondary | ICD-10-CM | POA: Diagnosis not present

## 2016-09-28 DIAGNOSIS — N186 End stage renal disease: Secondary | ICD-10-CM | POA: Diagnosis not present

## 2016-09-28 DIAGNOSIS — D631 Anemia in chronic kidney disease: Secondary | ICD-10-CM | POA: Diagnosis not present

## 2016-09-28 DIAGNOSIS — Z992 Dependence on renal dialysis: Secondary | ICD-10-CM | POA: Diagnosis not present

## 2016-09-28 DIAGNOSIS — N2581 Secondary hyperparathyroidism of renal origin: Secondary | ICD-10-CM | POA: Diagnosis not present

## 2016-09-28 DIAGNOSIS — D509 Iron deficiency anemia, unspecified: Secondary | ICD-10-CM | POA: Diagnosis not present

## 2016-10-01 DIAGNOSIS — D631 Anemia in chronic kidney disease: Secondary | ICD-10-CM | POA: Diagnosis not present

## 2016-10-01 DIAGNOSIS — Z992 Dependence on renal dialysis: Secondary | ICD-10-CM | POA: Diagnosis not present

## 2016-10-01 DIAGNOSIS — N2581 Secondary hyperparathyroidism of renal origin: Secondary | ICD-10-CM | POA: Diagnosis not present

## 2016-10-01 DIAGNOSIS — D509 Iron deficiency anemia, unspecified: Secondary | ICD-10-CM | POA: Diagnosis not present

## 2016-10-01 DIAGNOSIS — N186 End stage renal disease: Secondary | ICD-10-CM | POA: Diagnosis not present

## 2016-10-03 DIAGNOSIS — Z992 Dependence on renal dialysis: Secondary | ICD-10-CM | POA: Diagnosis not present

## 2016-10-03 DIAGNOSIS — D509 Iron deficiency anemia, unspecified: Secondary | ICD-10-CM | POA: Diagnosis not present

## 2016-10-03 DIAGNOSIS — N2581 Secondary hyperparathyroidism of renal origin: Secondary | ICD-10-CM | POA: Diagnosis not present

## 2016-10-03 DIAGNOSIS — N186 End stage renal disease: Secondary | ICD-10-CM | POA: Diagnosis not present

## 2016-10-03 DIAGNOSIS — D631 Anemia in chronic kidney disease: Secondary | ICD-10-CM | POA: Diagnosis not present

## 2016-10-05 DIAGNOSIS — N186 End stage renal disease: Secondary | ICD-10-CM | POA: Diagnosis not present

## 2016-10-05 DIAGNOSIS — N2581 Secondary hyperparathyroidism of renal origin: Secondary | ICD-10-CM | POA: Diagnosis not present

## 2016-10-05 DIAGNOSIS — D631 Anemia in chronic kidney disease: Secondary | ICD-10-CM | POA: Diagnosis not present

## 2016-10-05 DIAGNOSIS — Z992 Dependence on renal dialysis: Secondary | ICD-10-CM | POA: Diagnosis not present

## 2016-10-05 DIAGNOSIS — D509 Iron deficiency anemia, unspecified: Secondary | ICD-10-CM | POA: Diagnosis not present

## 2016-10-06 DIAGNOSIS — N186 End stage renal disease: Secondary | ICD-10-CM | POA: Diagnosis not present

## 2016-10-06 DIAGNOSIS — D631 Anemia in chronic kidney disease: Secondary | ICD-10-CM | POA: Diagnosis not present

## 2016-10-06 DIAGNOSIS — N2581 Secondary hyperparathyroidism of renal origin: Secondary | ICD-10-CM | POA: Diagnosis not present

## 2016-10-06 DIAGNOSIS — Z992 Dependence on renal dialysis: Secondary | ICD-10-CM | POA: Diagnosis not present

## 2016-10-08 DIAGNOSIS — N2581 Secondary hyperparathyroidism of renal origin: Secondary | ICD-10-CM | POA: Diagnosis not present

## 2016-10-08 DIAGNOSIS — N186 End stage renal disease: Secondary | ICD-10-CM | POA: Diagnosis not present

## 2016-10-08 DIAGNOSIS — D631 Anemia in chronic kidney disease: Secondary | ICD-10-CM | POA: Diagnosis not present

## 2016-10-08 DIAGNOSIS — Z992 Dependence on renal dialysis: Secondary | ICD-10-CM | POA: Diagnosis not present

## 2016-10-10 DIAGNOSIS — N186 End stage renal disease: Secondary | ICD-10-CM | POA: Diagnosis not present

## 2016-10-10 DIAGNOSIS — N2581 Secondary hyperparathyroidism of renal origin: Secondary | ICD-10-CM | POA: Diagnosis not present

## 2016-10-10 DIAGNOSIS — D631 Anemia in chronic kidney disease: Secondary | ICD-10-CM | POA: Diagnosis not present

## 2016-10-10 DIAGNOSIS — Z992 Dependence on renal dialysis: Secondary | ICD-10-CM | POA: Diagnosis not present

## 2016-10-12 DIAGNOSIS — N186 End stage renal disease: Secondary | ICD-10-CM | POA: Diagnosis not present

## 2016-10-12 DIAGNOSIS — E875 Hyperkalemia: Secondary | ICD-10-CM | POA: Diagnosis present

## 2016-10-12 DIAGNOSIS — Z5181 Encounter for therapeutic drug level monitoring: Secondary | ICD-10-CM | POA: Diagnosis not present

## 2016-10-12 DIAGNOSIS — Z940 Kidney transplant status: Secondary | ICD-10-CM | POA: Diagnosis not present

## 2016-10-12 DIAGNOSIS — R000 Tachycardia, unspecified: Secondary | ICD-10-CM | POA: Diagnosis not present

## 2016-10-12 DIAGNOSIS — H35389 Toxic maculopathy, unspecified eye: Secondary | ICD-10-CM | POA: Diagnosis present

## 2016-10-12 DIAGNOSIS — I120 Hypertensive chronic kidney disease with stage 5 chronic kidney disease or end stage renal disease: Secondary | ICD-10-CM | POA: Diagnosis present

## 2016-10-12 DIAGNOSIS — Z880 Allergy status to penicillin: Secondary | ICD-10-CM | POA: Diagnosis not present

## 2016-10-12 DIAGNOSIS — Z79899 Other long term (current) drug therapy: Secondary | ICD-10-CM | POA: Diagnosis not present

## 2016-10-12 DIAGNOSIS — M3214 Glomerular disease in systemic lupus erythematosus: Secondary | ICD-10-CM | POA: Diagnosis not present

## 2016-10-12 DIAGNOSIS — Z992 Dependence on renal dialysis: Secondary | ICD-10-CM | POA: Diagnosis not present

## 2016-10-12 DIAGNOSIS — Z01810 Encounter for preprocedural cardiovascular examination: Secondary | ICD-10-CM | POA: Diagnosis not present

## 2016-10-12 DIAGNOSIS — Z01818 Encounter for other preprocedural examination: Secondary | ICD-10-CM | POA: Diagnosis not present

## 2016-10-13 DIAGNOSIS — Z940 Kidney transplant status: Secondary | ICD-10-CM

## 2016-10-18 DIAGNOSIS — Z940 Kidney transplant status: Secondary | ICD-10-CM | POA: Diagnosis not present

## 2016-10-18 DIAGNOSIS — Z79899 Other long term (current) drug therapy: Secondary | ICD-10-CM | POA: Diagnosis not present

## 2016-10-21 DIAGNOSIS — N179 Acute kidney failure, unspecified: Secondary | ICD-10-CM | POA: Diagnosis not present

## 2016-10-21 DIAGNOSIS — R5383 Other fatigue: Secondary | ICD-10-CM | POA: Diagnosis not present

## 2016-10-21 DIAGNOSIS — Z79891 Long term (current) use of opiate analgesic: Secondary | ICD-10-CM | POA: Diagnosis not present

## 2016-10-21 DIAGNOSIS — Z792 Long term (current) use of antibiotics: Secondary | ICD-10-CM | POA: Diagnosis not present

## 2016-10-21 DIAGNOSIS — Z95828 Presence of other vascular implants and grafts: Secondary | ICD-10-CM | POA: Diagnosis not present

## 2016-10-21 DIAGNOSIS — I517 Cardiomegaly: Secondary | ICD-10-CM | POA: Diagnosis not present

## 2016-10-21 DIAGNOSIS — R509 Fever, unspecified: Secondary | ICD-10-CM | POA: Diagnosis not present

## 2016-10-21 DIAGNOSIS — I2510 Atherosclerotic heart disease of native coronary artery without angina pectoris: Secondary | ICD-10-CM | POA: Diagnosis not present

## 2016-10-21 DIAGNOSIS — K753 Granulomatous hepatitis, not elsewhere classified: Secondary | ICD-10-CM | POA: Diagnosis not present

## 2016-10-21 DIAGNOSIS — Z886 Allergy status to analgesic agent status: Secondary | ICD-10-CM | POA: Diagnosis not present

## 2016-10-21 DIAGNOSIS — N186 End stage renal disease: Secondary | ICD-10-CM | POA: Diagnosis not present

## 2016-10-21 DIAGNOSIS — Z992 Dependence on renal dialysis: Secondary | ICD-10-CM | POA: Diagnosis not present

## 2016-10-21 DIAGNOSIS — Z79899 Other long term (current) drug therapy: Secondary | ICD-10-CM | POA: Diagnosis not present

## 2016-10-21 DIAGNOSIS — Z5181 Encounter for therapeutic drug level monitoring: Secondary | ICD-10-CM | POA: Diagnosis not present

## 2016-10-21 DIAGNOSIS — Z880 Allergy status to penicillin: Secondary | ICD-10-CM | POA: Diagnosis not present

## 2016-10-21 DIAGNOSIS — K5900 Constipation, unspecified: Secondary | ICD-10-CM | POA: Diagnosis not present

## 2016-10-21 DIAGNOSIS — Z4803 Encounter for change or removal of drains: Secondary | ICD-10-CM | POA: Diagnosis not present

## 2016-10-21 DIAGNOSIS — M329 Systemic lupus erythematosus, unspecified: Secondary | ICD-10-CM | POA: Diagnosis not present

## 2016-10-21 DIAGNOSIS — Z940 Kidney transplant status: Secondary | ICD-10-CM | POA: Diagnosis not present

## 2016-10-21 DIAGNOSIS — Z466 Encounter for fitting and adjustment of urinary device: Secondary | ICD-10-CM | POA: Diagnosis not present

## 2016-10-21 DIAGNOSIS — I120 Hypertensive chronic kidney disease with stage 5 chronic kidney disease or end stage renal disease: Secondary | ICD-10-CM | POA: Diagnosis not present

## 2016-10-22 DIAGNOSIS — R531 Weakness: Secondary | ICD-10-CM | POA: Diagnosis not present

## 2016-10-22 DIAGNOSIS — H35389 Toxic maculopathy, unspecified eye: Secondary | ICD-10-CM | POA: Diagnosis not present

## 2016-10-22 DIAGNOSIS — Z4822 Encounter for aftercare following kidney transplant: Secondary | ICD-10-CM | POA: Diagnosis not present

## 2016-10-22 DIAGNOSIS — M329 Systemic lupus erythematosus, unspecified: Secondary | ICD-10-CM | POA: Diagnosis not present

## 2016-10-22 DIAGNOSIS — I10 Essential (primary) hypertension: Secondary | ICD-10-CM | POA: Diagnosis not present

## 2016-10-23 DIAGNOSIS — Z940 Kidney transplant status: Secondary | ICD-10-CM | POA: Diagnosis not present

## 2016-10-23 DIAGNOSIS — Z79899 Other long term (current) drug therapy: Secondary | ICD-10-CM | POA: Diagnosis not present

## 2016-10-25 DIAGNOSIS — M329 Systemic lupus erythematosus, unspecified: Secondary | ICD-10-CM | POA: Diagnosis not present

## 2016-10-25 DIAGNOSIS — Z940 Kidney transplant status: Secondary | ICD-10-CM | POA: Diagnosis not present

## 2016-10-25 DIAGNOSIS — R531 Weakness: Secondary | ICD-10-CM | POA: Diagnosis not present

## 2016-10-25 DIAGNOSIS — Z79899 Other long term (current) drug therapy: Secondary | ICD-10-CM | POA: Diagnosis not present

## 2016-10-25 DIAGNOSIS — Z4822 Encounter for aftercare following kidney transplant: Secondary | ICD-10-CM | POA: Diagnosis not present

## 2016-10-25 DIAGNOSIS — I10 Essential (primary) hypertension: Secondary | ICD-10-CM | POA: Diagnosis not present

## 2016-10-25 DIAGNOSIS — H35389 Toxic maculopathy, unspecified eye: Secondary | ICD-10-CM | POA: Diagnosis not present

## 2016-10-28 DIAGNOSIS — Z79899 Other long term (current) drug therapy: Secondary | ICD-10-CM | POA: Diagnosis not present

## 2016-10-28 DIAGNOSIS — I10 Essential (primary) hypertension: Secondary | ICD-10-CM | POA: Diagnosis not present

## 2016-10-28 DIAGNOSIS — H35389 Toxic maculopathy, unspecified eye: Secondary | ICD-10-CM | POA: Diagnosis not present

## 2016-10-28 DIAGNOSIS — R531 Weakness: Secondary | ICD-10-CM | POA: Diagnosis not present

## 2016-10-28 DIAGNOSIS — M329 Systemic lupus erythematosus, unspecified: Secondary | ICD-10-CM | POA: Diagnosis not present

## 2016-10-28 DIAGNOSIS — Z940 Kidney transplant status: Secondary | ICD-10-CM | POA: Diagnosis not present

## 2016-10-28 DIAGNOSIS — Z4822 Encounter for aftercare following kidney transplant: Secondary | ICD-10-CM | POA: Diagnosis not present

## 2016-10-30 DIAGNOSIS — Z79899 Other long term (current) drug therapy: Secondary | ICD-10-CM | POA: Diagnosis not present

## 2016-10-30 DIAGNOSIS — Z4822 Encounter for aftercare following kidney transplant: Secondary | ICD-10-CM | POA: Diagnosis not present

## 2016-10-30 DIAGNOSIS — R531 Weakness: Secondary | ICD-10-CM | POA: Diagnosis not present

## 2016-10-30 DIAGNOSIS — Z940 Kidney transplant status: Secondary | ICD-10-CM | POA: Diagnosis not present

## 2016-10-30 DIAGNOSIS — I10 Essential (primary) hypertension: Secondary | ICD-10-CM | POA: Diagnosis not present

## 2016-10-30 DIAGNOSIS — M329 Systemic lupus erythematosus, unspecified: Secondary | ICD-10-CM | POA: Diagnosis not present

## 2016-10-30 DIAGNOSIS — H35389 Toxic maculopathy, unspecified eye: Secondary | ICD-10-CM | POA: Diagnosis not present

## 2016-11-01 ENCOUNTER — Encounter (INDEPENDENT_AMBULATORY_CARE_PROVIDER_SITE_OTHER): Payer: Self-pay | Admitting: Vascular Surgery

## 2016-11-01 ENCOUNTER — Ambulatory Visit (INDEPENDENT_AMBULATORY_CARE_PROVIDER_SITE_OTHER): Payer: Medicare Other | Admitting: Vascular Surgery

## 2016-11-01 ENCOUNTER — Ambulatory Visit (INDEPENDENT_AMBULATORY_CARE_PROVIDER_SITE_OTHER): Payer: Medicare Other

## 2016-11-01 VITALS — BP 113/68 | HR 78 | Resp 16 | Wt 106.2 lb

## 2016-11-01 DIAGNOSIS — Z992 Dependence on renal dialysis: Secondary | ICD-10-CM

## 2016-11-01 DIAGNOSIS — N186 End stage renal disease: Secondary | ICD-10-CM

## 2016-11-01 DIAGNOSIS — M79602 Pain in left arm: Secondary | ICD-10-CM | POA: Diagnosis not present

## 2016-11-01 DIAGNOSIS — I159 Secondary hypertension, unspecified: Secondary | ICD-10-CM

## 2016-11-01 DIAGNOSIS — T829XXD Unspecified complication of cardiac and vascular prosthetic device, implant and graft, subsequent encounter: Secondary | ICD-10-CM | POA: Diagnosis not present

## 2016-11-01 DIAGNOSIS — Z940 Kidney transplant status: Secondary | ICD-10-CM | POA: Diagnosis not present

## 2016-11-01 DIAGNOSIS — Z79899 Other long term (current) drug therapy: Secondary | ICD-10-CM | POA: Diagnosis not present

## 2016-11-01 NOTE — Assessment & Plan Note (Signed)
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 This is not related to steal syndrome.

## 2016-11-01 NOTE — Assessment & Plan Note (Signed)
blood pressure control important in reducing the progression of atherosclerotic disease. On appropriate oral medications.  

## 2016-11-01 NOTE — Progress Notes (Signed)
MRN : [id number]  Robin Sanchez is a 51 y.o. ([birth year]) female who presents with chief complaint of  Chief Complaint  Patient presents with  . Follow-up  .  History of Present Illness: Patient returns today in follow up of ESRD and dialysis access. Since her last visit, she has gotten a kidney transplant and is doing very well with this. She is not currently using her access and has not needed dialysis since her transplant. She is having left arm pain. This starts in her shoulder and radiates down the arm. She reports no trauma or injury. This does not happen with any specific activities. She is study today with duplex which demonstrates a patent left brachial artery axillary vein AV graft. A steal study was also performed demonstrating no evidence of steal in the left upper extremity.   Current Outpatient Prescriptions  Medication Sig Dispense Refill  . acetaminophen (TYLENOL) 325 MG tablet Take 325-650 mg by mouth.    Marland Kitchen amLODipine (NORVASC) 10 MG tablet Take 10 mg by mouth daily.    . carvedilol (COREG) 25 MG tablet Take 25 mg by mouth 2 (two) times daily with a meal.    . docusate sodium (COLACE) 100 MG capsule Take 100 mg by mouth.    . famotidine (PEPCID) 20 MG tablet Take 20 mg by mouth.    . magnesium oxide (MAG-OX) 400 MG tablet Take 400 mg by mouth.    . mycophenolate (MYFORTIC) 180 MG EC tablet Take 540 mg by mouth.    . oxyCODONE (OXY IR/ROXICODONE) 5 MG immediate release tablet Take 5-10 mg by mouth.    . polyethylene glycol (MIRALAX / GLYCOLAX) packet Take by mouth.    . sodium bicarbonate 650 MG tablet Take 650 mg by mouth.    . sulfamethoxazole-trimethoprim (BACTRIM,SEPTRA) 400-80 MG tablet Take by mouth.    . tacrolimus (PROGRAF) 1 MG capsule Take 1 mg by mouth.    . valGANciclovir (VALCYTE) 450 MG tablet Take 450 mg by mouth.    . cinacalcet (SENSIPAR) 60 MG tablet Take 60 mg by mouth daily.    . Ferric Citrate (AURYXIA) 1 GM 210 MG(Fe) TABS Take 2 tablets by mouth 3  (three) times daily before meals.     No current facility-administered medications for this visit.     Past Medical History:  Diagnosis Date  . Anemia   . Cancer ([place])    cervical  . Chronic kidney disease    dialysis t,t,s  . Dysrhythmia   . GERD (gastroesophageal reflux disease)   . Hypertension   . Lupus    currently in remission  . Osteoporosis   . Peripheral vascular disease ([place])   . Shortness of breath dyspnea    occas    Past Surgical History:  Procedure Laterality Date  . ARTERIOVENOUS GRAFT PLACEMENT     currently dialysis access in right thigh  . DILATION AND CURETTAGE OF UTERUS    . PERIPHERAL VASCULAR CATHETERIZATION N/A 11/13/2015   Procedure: A/V Shuntogram/Fistulagram;  Surgeon: Algernon Huxley, MD;  Location: [facility];  Service: Cardiovascular;  Laterality: N/A;  . PERIPHERAL VASCULAR CATHETERIZATION  11/13/2015   Procedure: Dialysis/Perma Catheter Insertion;  Surgeon: Algernon Huxley, MD;  Location: [facility];  Service: Cardiovascular;;  . PERIPHERAL VASCULAR CATHETERIZATION N/A 01/18/2016   Procedure: Dialysis/Perma Catheter Removal;  Surgeon: Algernon Huxley, MD;  Location: [facility];  Service: Cardiovascular;  Laterality: N/A;  . REVISION OF ARTERIOVENOUS GORETEX  GRAFT Left 11/23/2015   Procedure: REVISION OF ARTERIOVENOUS GORETEX GRAFT;  Surgeon: Algernon Huxley, MD;  Location: [facility];  Service: Vascular;  Laterality: Left;  . STENTS     MULTIPLE DIALYSIS STENTS    Social History Social History  Substance Use Topics  . Smoking status: Never Smoker  . Smokeless tobacco: Never Used  . Alcohol use No   No IVDU  Family History No bleeding or clotting disorders  Allergies  Allergen Reactions  . Shellfish Allergy Shortness Of Breath and Swelling    Angioedema, throat swells closed  . Bleomycin   . Penicillins Nausea And Vomiting    Has patient had a PCN reaction causing immediate rash, facial/tongue/throat swelling, SOB or  lightheadedness with hypotension: no Has patient had a PCN reaction causing severe rash involving mucus membranes or skin necrosis:no Has patient had a PCN reaction that required hospitalization no Has patient had a PCN reaction occurring within the last 10 years: no If all of the above answers are "NO", then may proceed with Cephalosporin use.  . Aspirin Rash  . Vancomycin Rash     REVIEW OF SYSTEMS (Negative unless checked)  Constitutional: [] Weight loss  [] Fever  [] Chills Cardiac: [] Chest pain   [] Chest pressure   [] Palpitations   [] Shortness of breath when laying flat   [] Shortness of breath at rest   [] Shortness of breath with exertion. Vascular:  [] Pain in legs with walking   [] Pain in legs at rest   [] Pain in legs when laying flat   [] Claudication   [] Pain in feet when walking  [] Pain in feet at rest  [] Pain in feet when laying flat   [] History of DVT   [] Phlebitis   [] Swelling in legs   [] Varicose veins   [] Non-healing ulcers Pulmonary:   [] Uses home oxygen   [] Productive cough   [] Hemoptysis   [] Wheeze  [] COPD   [] Asthma Neurologic:  [] Dizziness  [] Blackouts   [] Seizures   [] History of stroke   [] History of TIA  [] Aphasia   [] Temporary blindness   [] Dysphagia   [x] Weakness or numbness in arms   [] Weakness or numbness in legs Musculoskeletal:  [x] Arthritis   [] Joint swelling   [] Joint pain   [] Low back pain Hematologic:  [] Easy bruising  [] Easy bleeding   [] Hypercoagulable state   [] Anemic   Gastrointestinal:  [] Blood in stool   [] Vomiting blood  [] Gastroesophageal reflux/heartburn   [] Abdominal pain Genitourinary:  [x] Chronic kidney disease   [] Difficult urination  [] Frequent urination  [] Burning with urination   [] Hematuria Skin:  [] Rashes   [] Ulcers   [] Wounds Psychological:  [] History of anxiety   []  History of major depression.  Physical Examination  BP 113/68   Pulse 78   Resp 16   Wt 106 lb 3.2 oz (48.2 kg)   BMI 23.81 kg/m  Gen:  WD/WN, NAD Head: [place]/AT, No temporalis  wasting. Ear/Nose/Throat: Hearing grossly intact, nares w/o erythema or drainage, trachea midline Eyes: Conjunctiva clear. Sclera non-icteric Neck: Supple.  No JVD.  Pulmonary:  Good air movement, no use of accessory muscles.  Cardiac: RRR, normal S1, S2 Vascular: good thrill in left upper arm AVG Vessel Right Left  Radial Palpable Palpable                                   Gastrointestinal: soft, non-tender/non-distended. No guarding/reflex.  Musculoskeletal: M/S 5/5 throughout.  No deformity or atrophy. Neurologic: Sensation grossly intact  in extremities.  Symmetrical.  Speech is fluent.  Psychiatric: Judgment intact, Mood & affect appropriate for pt's clinical situation. Dermatologic: No rashes or ulcers noted.  No cellulitis or open wounds.       Labs No results found for this or any previous visit (from the past 2160 hour(s)).  Radiology No results found.    Assessment/Plan  HTN (hypertension) blood pressure control important in reducing the progression of atherosclerotic disease. On appropriate oral medications.   Arm pain, diffuse, left This sounds more musculoskeletal or possibly neuropathic from cervical spine disease. This is not related to steal syndrome.  ESRD (end stage renal disease) ([place]) She is now status post kidney transplant and is no longer using her graft.  She is study today with duplex which demonstrates a patent left brachial artery axillary vein AV graft. A steal study was also performed demonstrating no evidence of steal in the left upper extremity. I do not think the graft is currently causing any major issues, so for now we will just leave it alone and will likely thrombose at some point in the future. If she develops any problems from this, this can always be ligated. I will see her back as needed.    Leotis Pain, MD  11/01/2016 3:53 PM    This note was created with Dragon medical transcription system.  Any errors from dictation are  purely unintentional

## 2016-11-01 NOTE — Assessment & Plan Note (Signed)
She is now status post kidney transplant and is no longer using her graft.  She is study today with duplex which demonstrates a patent left brachial artery axillary vein AV graft. A steal study was also performed demonstrating no evidence of steal in the left upper extremity. I do not think the graft is currently causing any major issues, so for now we will just leave it alone and will likely thrombose at some point in the future. If she develops any problems from this, this can always be ligated. I will see her back as needed.

## 2016-11-04 DIAGNOSIS — Z79899 Other long term (current) drug therapy: Secondary | ICD-10-CM | POA: Diagnosis not present

## 2016-11-04 DIAGNOSIS — Z940 Kidney transplant status: Secondary | ICD-10-CM | POA: Diagnosis not present

## 2016-11-06 DIAGNOSIS — H35389 Toxic maculopathy, unspecified eye: Secondary | ICD-10-CM | POA: Diagnosis not present

## 2016-11-06 DIAGNOSIS — M329 Systemic lupus erythematosus, unspecified: Secondary | ICD-10-CM | POA: Diagnosis not present

## 2016-11-06 DIAGNOSIS — Z79899 Other long term (current) drug therapy: Secondary | ICD-10-CM | POA: Diagnosis not present

## 2016-11-06 DIAGNOSIS — Z940 Kidney transplant status: Secondary | ICD-10-CM | POA: Diagnosis not present

## 2016-11-06 DIAGNOSIS — R531 Weakness: Secondary | ICD-10-CM | POA: Diagnosis not present

## 2016-11-06 DIAGNOSIS — Z4822 Encounter for aftercare following kidney transplant: Secondary | ICD-10-CM | POA: Diagnosis not present

## 2016-11-06 DIAGNOSIS — I10 Essential (primary) hypertension: Secondary | ICD-10-CM | POA: Diagnosis not present

## 2016-11-08 DIAGNOSIS — Z79899 Other long term (current) drug therapy: Secondary | ICD-10-CM | POA: Diagnosis not present

## 2016-11-08 DIAGNOSIS — H35389 Toxic maculopathy, unspecified eye: Secondary | ICD-10-CM | POA: Diagnosis not present

## 2016-11-08 DIAGNOSIS — R531 Weakness: Secondary | ICD-10-CM | POA: Diagnosis not present

## 2016-11-08 DIAGNOSIS — Z794 Long term (current) use of insulin: Secondary | ICD-10-CM | POA: Diagnosis not present

## 2016-11-08 DIAGNOSIS — M329 Systemic lupus erythematosus, unspecified: Secondary | ICD-10-CM | POA: Diagnosis not present

## 2016-11-08 DIAGNOSIS — I10 Essential (primary) hypertension: Secondary | ICD-10-CM | POA: Diagnosis not present

## 2016-11-08 DIAGNOSIS — Z4822 Encounter for aftercare following kidney transplant: Secondary | ICD-10-CM | POA: Diagnosis not present

## 2016-11-11 DIAGNOSIS — Z940 Kidney transplant status: Secondary | ICD-10-CM | POA: Diagnosis not present

## 2016-11-11 DIAGNOSIS — Z79899 Other long term (current) drug therapy: Secondary | ICD-10-CM | POA: Diagnosis not present

## 2016-11-13 DIAGNOSIS — Z940 Kidney transplant status: Secondary | ICD-10-CM | POA: Diagnosis not present

## 2016-11-15 DIAGNOSIS — Z940 Kidney transplant status: Secondary | ICD-10-CM | POA: Diagnosis not present

## 2016-11-15 DIAGNOSIS — Z79899 Other long term (current) drug therapy: Secondary | ICD-10-CM | POA: Diagnosis not present

## 2016-11-19 DIAGNOSIS — I10 Essential (primary) hypertension: Secondary | ICD-10-CM | POA: Diagnosis not present

## 2016-11-19 DIAGNOSIS — D899 Disorder involving the immune mechanism, unspecified: Secondary | ICD-10-CM | POA: Diagnosis not present

## 2016-11-19 DIAGNOSIS — E559 Vitamin D deficiency, unspecified: Secondary | ICD-10-CM | POA: Diagnosis not present

## 2016-11-19 DIAGNOSIS — Z4822 Encounter for aftercare following kidney transplant: Secondary | ICD-10-CM | POA: Diagnosis not present

## 2016-11-19 DIAGNOSIS — M329 Systemic lupus erythematosus, unspecified: Secondary | ICD-10-CM | POA: Diagnosis not present

## 2016-11-19 DIAGNOSIS — Z940 Kidney transplant status: Secondary | ICD-10-CM | POA: Diagnosis not present

## 2016-11-25 DIAGNOSIS — Z940 Kidney transplant status: Secondary | ICD-10-CM | POA: Diagnosis not present

## 2016-11-25 DIAGNOSIS — Z79899 Other long term (current) drug therapy: Secondary | ICD-10-CM | POA: Diagnosis not present

## 2016-11-28 DIAGNOSIS — Z940 Kidney transplant status: Secondary | ICD-10-CM | POA: Diagnosis not present

## 2016-12-03 DIAGNOSIS — Z940 Kidney transplant status: Secondary | ICD-10-CM | POA: Diagnosis not present

## 2016-12-03 DIAGNOSIS — Z79899 Other long term (current) drug therapy: Secondary | ICD-10-CM | POA: Diagnosis not present

## 2016-12-05 DIAGNOSIS — Z79899 Other long term (current) drug therapy: Secondary | ICD-10-CM | POA: Diagnosis not present

## 2016-12-05 DIAGNOSIS — Z940 Kidney transplant status: Secondary | ICD-10-CM | POA: Diagnosis not present

## 2016-12-09 DIAGNOSIS — Z79899 Other long term (current) drug therapy: Secondary | ICD-10-CM | POA: Diagnosis not present

## 2016-12-09 DIAGNOSIS — Z940 Kidney transplant status: Secondary | ICD-10-CM | POA: Diagnosis not present

## 2016-12-12 DIAGNOSIS — Z79899 Other long term (current) drug therapy: Secondary | ICD-10-CM | POA: Diagnosis not present

## 2016-12-12 DIAGNOSIS — Z940 Kidney transplant status: Secondary | ICD-10-CM | POA: Diagnosis not present

## 2016-12-13 DIAGNOSIS — R21 Rash and other nonspecific skin eruption: Secondary | ICD-10-CM | POA: Diagnosis not present

## 2016-12-13 DIAGNOSIS — Z91013 Allergy to seafood: Secondary | ICD-10-CM | POA: Diagnosis not present

## 2016-12-13 DIAGNOSIS — Z880 Allergy status to penicillin: Secondary | ICD-10-CM | POA: Diagnosis not present

## 2016-12-13 DIAGNOSIS — T7803XS Anaphylactic reaction due to other fish, sequela: Secondary | ICD-10-CM | POA: Diagnosis not present

## 2016-12-13 DIAGNOSIS — Z889 Allergy status to unspecified drugs, medicaments and biological substances status: Secondary | ICD-10-CM | POA: Diagnosis not present

## 2016-12-16 DIAGNOSIS — Z940 Kidney transplant status: Secondary | ICD-10-CM | POA: Diagnosis not present

## 2016-12-16 DIAGNOSIS — Z79899 Other long term (current) drug therapy: Secondary | ICD-10-CM | POA: Diagnosis not present

## 2016-12-19 DIAGNOSIS — Z79899 Other long term (current) drug therapy: Secondary | ICD-10-CM | POA: Diagnosis not present

## 2016-12-19 DIAGNOSIS — Z940 Kidney transplant status: Secondary | ICD-10-CM | POA: Diagnosis not present

## 2016-12-23 DIAGNOSIS — Z79899 Other long term (current) drug therapy: Secondary | ICD-10-CM | POA: Diagnosis not present

## 2016-12-23 DIAGNOSIS — Z940 Kidney transplant status: Secondary | ICD-10-CM | POA: Diagnosis not present

## 2016-12-26 DIAGNOSIS — Z940 Kidney transplant status: Secondary | ICD-10-CM | POA: Diagnosis not present

## 2016-12-26 DIAGNOSIS — Z79899 Other long term (current) drug therapy: Secondary | ICD-10-CM | POA: Diagnosis not present

## 2016-12-30 DIAGNOSIS — Z79899 Other long term (current) drug therapy: Secondary | ICD-10-CM | POA: Diagnosis not present

## 2016-12-30 DIAGNOSIS — Z940 Kidney transplant status: Secondary | ICD-10-CM | POA: Diagnosis not present

## 2017-01-06 ENCOUNTER — Ambulatory Visit
Admission: RE | Admit: 2017-01-06 | Discharge: 2017-01-06 | Disposition: A | Discharge status: Home / Self Care | Payer: MEDICARE

## 2017-01-06 DIAGNOSIS — Z79899 Other long term (current) drug therapy: Secondary | ICD-10-CM

## 2017-01-06 DIAGNOSIS — Z940 Kidney transplant status: Principal | ICD-10-CM

## 2017-01-13 ENCOUNTER — Ambulatory Visit
Admission: RE | Admit: 2017-01-13 | Discharge: 2017-01-13 | Disposition: A | Discharge status: Home / Self Care | Payer: MEDICARE

## 2017-01-13 DIAGNOSIS — Z79899 Other long term (current) drug therapy: Secondary | ICD-10-CM

## 2017-01-13 DIAGNOSIS — Z940 Kidney transplant status: Principal | ICD-10-CM

## 2017-01-15 MED FILL — VALGANCICLOVIR/450MG/TABS: VALGANCICLOVIR/450MG/TABS | 30 days supply | Qty: 30 | Fill #0

## 2017-01-15 MED FILL — MYFORTIC/180MG/TAB: MYFORTIC/180MG/TAB | 30 days supply | Qty: 180 | Fill #0

## 2017-01-20 ENCOUNTER — Ambulatory Visit
Admission: RE | Admit: 2017-01-20 | Discharge: 2017-01-20 | Disposition: A | Discharge status: Home / Self Care | Payer: MEDICARE

## 2017-01-20 DIAGNOSIS — Z940 Kidney transplant status: Principal | ICD-10-CM

## 2017-01-20 DIAGNOSIS — Z79899 Other long term (current) drug therapy: Secondary | ICD-10-CM

## 2017-01-20 MED FILL — SODIUM BICARBONATE/650MG/TABS: SODIUM BICARBONATE/650MG/TABS | 50 days supply | Qty: 100 | Fill #1

## 2017-01-21 LAB — TACROLIMUS BLOOD: Lab: 7.7

## 2017-01-27 ENCOUNTER — Ambulatory Visit
Admission: RE | Admit: 2017-01-27 | Discharge: 2017-01-27 | Disposition: A | Discharge status: Home / Self Care | Payer: MEDICARE

## 2017-01-27 DIAGNOSIS — Z79899 Other long term (current) drug therapy: Secondary | ICD-10-CM

## 2017-01-27 DIAGNOSIS — Z940 Kidney transplant status: Principal | ICD-10-CM

## 2017-01-27 LAB — MAGNESIUM: Magnesium:MCnc:Pt:Ser/Plas:Qn:: 1.3 — ABNORMAL LOW

## 2017-01-27 LAB — BASIC METABOLIC PANEL
ANION GAP: 11 mmol/L (ref 9–15)
BLOOD UREA NITROGEN: 23 mg/dL — ABNORMAL HIGH (ref 7–21)
BUN / CREAT RATIO: 38
CALCIUM: 10.9 mg/dL — ABNORMAL HIGH (ref 8.5–10.2)
CHLORIDE: 114 mmol/L — ABNORMAL HIGH (ref 98–107)
CO2: 20 mmol/L — ABNORMAL LOW (ref 22.0–30.0)
CREATININE: 0.6 mg/dL (ref 0.60–1.00)
EGFR MDRD AF AMER: >=60 mL/min/{1.73_m2} (ref >=60)
EGFR MDRD NON AF AMER: >=60 mL/min/{1.73_m2} (ref >=60)
GLUCOSE RANDOM: 82 mg/dL (ref 65–179)
POTASSIUM: 5 mmol/L (ref 3.5–5.0)

## 2017-01-27 LAB — CBC W/ AUTO DIFF
BASOPHILS ABSOLUTE COUNT: 0 10*9/L (ref 0.0–0.1)
EOSINOPHILS ABSOLUTE COUNT: 0.1 10*9/L (ref 0.0–0.4)
HEMATOCRIT: 32.5 % — ABNORMAL LOW (ref 36.0–46.0)
LARGE UNSTAINED CELLS: 2 % (ref 0–4)
LYMPHOCYTES ABSOLUTE COUNT: 0.4 10*9/L — ABNORMAL LOW (ref 1.5–5.0)
MEAN CORPUSCULAR HEMOGLOBIN CONC: 30.6 g/dL — ABNORMAL LOW (ref 31.0–37.0)
MEAN CORPUSCULAR HEMOGLOBIN: 20.7 pg — ABNORMAL LOW (ref 26.0–34.0)
MEAN CORPUSCULAR VOLUME: 67.6 fL — ABNORMAL LOW (ref 80.0–100.0)
MONOCYTES ABSOLUTE COUNT: 0.3 10*9/L (ref 0.2–0.8)
NEUTROPHILS ABSOLUTE COUNT: 2.2 10*9/L (ref 2.0–7.5)
PLATELET COUNT: 162 10*9/L (ref 150–440)
RED BLOOD CELL COUNT: 4.8 10*12/L (ref 4.00–5.20)
RED CELL DISTRIBUTION WIDTH: 16.6 % — ABNORMAL HIGH (ref 12.0–15.0)
WBC ADJUSTED: 3.1 10*9/L — ABNORMAL LOW (ref 4.5–11.0)

## 2017-01-27 LAB — PLATELET COUNT: Lab: 162

## 2017-01-27 LAB — EGFR MDRD AF AMER: Glomerular filtration rate/1.73 sq M.predicted.black:ArVRat:Pt:Ser/Plas/Bld:Qn:Creatinine-based formula (MDRD): >=60

## 2017-01-27 LAB — OVALOCYTES

## 2017-01-28 LAB — TACROLIMUS, TROUGH: Lab: 7.9

## 2017-02-03 ENCOUNTER — Ambulatory Visit
Admission: RE | Admit: 2017-02-03 | Discharge: 2017-02-03 | Disposition: A | Discharge status: Home / Self Care | Payer: MEDICARE

## 2017-02-03 DIAGNOSIS — Z940 Kidney transplant status: Principal | ICD-10-CM

## 2017-02-03 DIAGNOSIS — Z79899 Other long term (current) drug therapy: Secondary | ICD-10-CM

## 2017-02-03 LAB — EOSINOPHILS ABSOLUTE COUNT: Lab: 0.1

## 2017-02-03 LAB — POLYCHROMASIA

## 2017-02-03 LAB — CBC W/ AUTO DIFF
BASOPHILS ABSOLUTE COUNT: 0 10*9/L (ref 0.0–0.1)
HEMATOCRIT: 34.2 % — ABNORMAL LOW (ref 36.0–46.0)
LARGE UNSTAINED CELLS: 2 % (ref 0–4)
MEAN CORPUSCULAR HEMOGLOBIN CONC: 30 g/dL — ABNORMAL LOW (ref 31.0–37.0)
MEAN CORPUSCULAR HEMOGLOBIN: 20.1 pg — ABNORMAL LOW (ref 26.0–34.0)
MEAN CORPUSCULAR VOLUME: 67.1 fL — ABNORMAL LOW (ref 80.0–100.0)
MEAN PLATELET VOLUME: 6.1 fL — ABNORMAL LOW (ref 7.0–10.0)
MONOCYTES ABSOLUTE COUNT: 0.3 10*9/L (ref 0.2–0.8)
NEUTROPHILS ABSOLUTE COUNT: 2.8 10*9/L (ref 2.0–7.5)
PLATELET COUNT: 164 10*9/L (ref 150–440)
RED BLOOD CELL COUNT: 5.1 10*12/L (ref 4.00–5.20)
RED CELL DISTRIBUTION WIDTH: 16.6 % — ABNORMAL HIGH (ref 12.0–15.0)
WBC ADJUSTED: 3.6 10*9/L — ABNORMAL LOW (ref 4.5–11.0)

## 2017-02-03 LAB — MAGNESIUM
MAGNESIUM: 1.3 mg/dL — ABNORMAL LOW (ref 1.6–2.2)
Magnesium:MCnc:Pt:Ser/Plas:Qn:: 1.3 — ABNORMAL LOW

## 2017-02-03 LAB — TACROLIMUS LEVEL: TACROLIMUS BLOOD: 8.2 ng/mL

## 2017-02-03 LAB — TACROLIMUS BLOOD: Lab: 8.2

## 2017-02-03 LAB — BASIC METABOLIC PANEL
BLOOD UREA NITROGEN: 26 mg/dL — ABNORMAL HIGH (ref 7–21)
BUN / CREAT RATIO: 40
CALCIUM: 11.1 mg/dL — ABNORMAL HIGH (ref 8.5–10.2)
CHLORIDE: 110 mmol/L — ABNORMAL HIGH (ref 98–107)
CO2: 22 mmol/L (ref 22.0–30.0)
CREATININE: 0.65 mg/dL (ref 0.60–1.00)
EGFR MDRD AF AMER: >=60 mL/min/{1.73_m2} (ref >=60)
EGFR MDRD NON AF AMER: >=60 mL/min/{1.73_m2} (ref >=60)
GLUCOSE RANDOM: 74 mg/dL (ref 65–179)
SODIUM: 144 mmol/L (ref 135–145)

## 2017-02-03 LAB — PHOSPHORUS: Phosphate:MCnc:Pt:Ser/Plas:Qn:: 2.9

## 2017-02-03 LAB — CREATININE: Creatinine:MCnc:Pt:Ser/Plas:Qn:: 0.65

## 2017-02-06 NOTE — Unmapped (Signed)
[facility] Refill Coordination Note   Specialty Medication(s): Myfortic 180mg   Additional Medications shipped: carvedilol 25mg , smz-tmp 400-80mg , famotidine 20mg , MagOx    Lynn Erickson, DOB: 11-21-1965  Phone: [phone] (home) , Alternate phone contact: N/A  Phone or address changes today?: No  All above HIPAA information was verified with patient.  Shipping Address: [address]  [place] [address]   Insurance changes? No    Completed refill call assessment today to schedule patient's medication shipment from the [facility] [phone]).      Confirmed the medication and dosage are correct and have not changed: Yes, regimen is correct and unchanged.    Confirmed patient started or stopped the following medications in the past month:  No, there are no changes reported at this time.    Are you tolerating your medication?:  Lynn Erickson reports tolerating the medication.    ADHERENCE    Myfortic 180 mg   Quantity filled last month: 180   # of tablets left on hand: 25      Did you miss any doses in the past 4 weeks? No missed doses reported.    FINANCIAL/SHIPPING    Delivery Scheduled: Yes, Expected medication delivery date: 085/06/18     Lynn Erickson did not have any additional questions at this time.    Delivery address validated in [facility] scheduling system: Yes, address listed in [facility] is correct.    We will follow up with patient monthly for standard refill processing and delivery.      Thank you,  Roderic Palau   [facility] Specialty Pharmacist

## 2017-02-07 MED FILL — MAGNESIUM OXIDE/400MG/TABS: MAGNESIUM OXIDE/400MG/TABS | 60 days supply | Qty: 120 | Fill #0

## 2017-02-07 MED FILL — FAMOTIDINE/20MG/TABS: FAMOTIDINE/20MG/TABS | 30 days supply | Qty: 30 | Fill #0

## 2017-02-07 MED FILL — SMZ-TMP/400-80MG/TAB: SMZ-TMP/400-80MG/TAB | 28 days supply | Qty: 12 | Fill #2

## 2017-02-07 MED FILL — MYFORTIC/180MG/TAB: MYFORTIC/180MG/TAB | 30 days supply | Qty: 180 | Fill #1

## 2017-02-07 MED FILL — CARVEDILOL/25MG/TABS: CARVEDILOL/25MG/TABS | 30 days supply | Qty: 60 | Fill #1

## 2017-02-10 ENCOUNTER — Ambulatory Visit
Admission: RE | Admit: 2017-02-10 | Discharge: 2017-02-10 | Disposition: A | Discharge status: Home / Self Care | Payer: MEDICARE

## 2017-02-10 DIAGNOSIS — Z79899 Other long term (current) drug therapy: Secondary | ICD-10-CM

## 2017-02-10 DIAGNOSIS — Z940 Kidney transplant status: Principal | ICD-10-CM

## 2017-02-10 LAB — TACROLIMUS BLOOD: Lab: 8.2

## 2017-02-10 LAB — CBC W/ AUTO DIFF
BASOPHILS ABSOLUTE COUNT: 0 10*9/L (ref 0.0–0.1)
EOSINOPHILS ABSOLUTE COUNT: 0.1 10*9/L (ref 0.0–0.4)
HEMATOCRIT: 33.1 % — ABNORMAL LOW (ref 36.0–46.0)
HEMOGLOBIN: 10.1 g/dL — ABNORMAL LOW (ref 13.5–16.0)
LARGE UNSTAINED CELLS: 4 % (ref 0–4)
LYMPHOCYTES ABSOLUTE COUNT: 0.4 10*9/L — ABNORMAL LOW (ref 1.5–5.0)
MEAN CORPUSCULAR HEMOGLOBIN CONC: 30.4 g/dL — ABNORMAL LOW (ref 31.0–37.0)
MEAN CORPUSCULAR VOLUME: 66.8 fL — ABNORMAL LOW (ref 80.0–100.0)
MEAN PLATELET VOLUME: 6.5 fL — ABNORMAL LOW (ref 7.0–10.0)
MONOCYTES ABSOLUTE COUNT: 0.5 10*9/L (ref 0.2–0.8)
RED CELL DISTRIBUTION WIDTH: 16.5 % — ABNORMAL HIGH (ref 12.0–15.0)
WBC ADJUSTED: 3.4 10*9/L — ABNORMAL LOW (ref 4.5–11.0)

## 2017-02-10 LAB — BASIC METABOLIC PANEL
ANION GAP: 11 mmol/L (ref 9–15)
BLOOD UREA NITROGEN: 26 mg/dL — ABNORMAL HIGH (ref 7–21)
BUN / CREAT RATIO: 39
CALCIUM: 11.2 mg/dL — ABNORMAL HIGH (ref 8.5–10.2)
CHLORIDE: 112 mmol/L — ABNORMAL HIGH (ref 98–107)
CO2: 23 mmol/L (ref 22.0–30.0)
CREATININE: 0.66 mg/dL (ref 0.60–1.00)
EGFR MDRD AF AMER: >=60 mL/min/{1.73_m2} (ref >=60)
EGFR MDRD NON AF AMER: >=60 mL/min/{1.73_m2} (ref >=60)
POTASSIUM: 4.8 mmol/L (ref 3.5–5.0)
SODIUM: 146 mmol/L — ABNORMAL HIGH (ref 135–145)

## 2017-02-10 LAB — CREATININE: Creatinine:MCnc:Pt:Ser/Plas:Qn:: 0.66

## 2017-02-10 LAB — MAGNESIUM: Magnesium:MCnc:Pt:Ser/Plas:Qn:: 1.3 — ABNORMAL LOW

## 2017-02-10 LAB — TACROLIMUS LEVEL: TACROLIMUS BLOOD: 8.2 ng/mL

## 2017-02-10 LAB — NEUTROPHILS ABSOLUTE COUNT: Lab: 2.3

## 2017-02-10 LAB — PHOSPHORUS: Phosphate:MCnc:Pt:Ser/Plas:Qn:: 2.9

## 2017-02-12 NOTE — Unmapped (Signed)
Lab orders updated

## 2017-02-25 NOTE — Unmapped (Signed)
[facility] transplant nephrology clinic visit    assessment and plan:  1. s/p kidney transplant 10/12/16. baseline creatinine 0.6-0.8 mg/dl. no proteinuria. no donor specific hla ab detected. tacrolimus decr to 1mg  bid re: 12hr lvl 6-8 ng/ml.  2. hypertension. blood pressure goal < 130/80 mmhg.   3. +/-congestive heart failure/volume overload symptoms. echocardiogram, pvls left upper ext av fistula.  4. preventive medicine. trimethoprim/sulfamethoxazole 80mg /400mg  3x/wk x12m 04-10/18. influenza '17. pcv13 pneumococcal '17.    history of present illness:    [name] is a 51 yr old woman seen in follow up post kidney transplant 10/12/2016. +mild fatigue. appetite nl. no fevers chills or sweats. +15lb weight gain/past 5m. +atypical/nonexertional chest pain and palpitations. no orthopnea or shortness of breath at rest. +dyspnea with moderate exertion. +lower ext dependent edema associated with incr diet na+ intake. no abd pain. no n/v/d. no myalgias or arthralgias. no urine symptoms. all other systems reviewed and negative x10 systems.    past medical hx:  1. 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 baseline creatinine 0.6-0.8 mg/dl.  2. systemic lupus erythematosus   3. hypertension    past surgical hx: left upper ext av graft x2 '07-08. bilateral tubal ligation. kidney transplant '18.    allergies: penicillin, vancomycin, aspirin, shellfish, hydroxychloroquine.    medications: tacrolimus 1.5mg /1mg  am/pm, mycophenolate sodium 540mg  bid, carvedilol 25mg  bid, famotidine 20mg  prn, trimethoprim/sulfamethoxazole 80/400mg  3x/wk, na+bicarbonate 650mg  bid, mg oxide 400mg  daily.    soc hx: married x4 children. no smoking hx     physical exam: t97.2 p68 bp110/58 wt58.9kg bmi 30.2. wd/wn woman nl/appropriate affect and mood. nl sclera anicteric. mmm no thrush. neck supple no palpable ln. heart rrr nl s1s2 no m/r/g. lungs clear bilateral. abd soft nt/nd. no lower ext edema. msk no synovitis/tophi. left upper ext av fistula +bruit/thrill. skin no rash. neuro alert oriented non focal exam.    labs 02/26/17: wbc3.5 hgb9.6 hct32.3 plts196. na141 k4.9 cl109 bicarb22 bun19 cr0.6 glc89 ca11 mg1.2 phos2.6 albumin 4.3. liver function panel nl. cmv pcr not detected. tacrolimus lvl 9.6 ng/ml. urine protein cr 0.056.    scribe's attestation: Elwyn Lade, MD obtained and performed the history, physical exam and medical decision making elements that were entered into the chart. signed by PJ Helderlein, Scribe, on February 26, 2017 9:56 AM.    ----------------------------------------------------------------------------------------------------------------------  May 18, 2017 9:48 PM. documentation assistance provided by the scribe. i was present during the time the encounter was recorded. the information recorded by the scribe was done at my direction and has been reviewed and validated by me.  ----------------------------------------------------------------------------------------------------------------------

## 2017-02-26 ENCOUNTER — Ambulatory Visit
Admission: RE | Admit: 2017-02-26 | Discharge: 2017-02-26 | Disposition: A | Discharge status: Home / Self Care | Payer: MEDICARE | Attending: Registered" | Admitting: Registered"

## 2017-02-26 ENCOUNTER — Ambulatory Visit
Admission: RE | Admit: 2017-02-26 | Discharge: 2017-02-26 | Disposition: A | Discharge status: Home / Self Care | Payer: MEDICARE | Attending: Pharmacist Clinician (PhC)/ Clinical Pharmacy Specialist | Admitting: Pharmacist Clinician (PhC)/ Clinical Pharmacy Specialist

## 2017-02-26 ENCOUNTER — Ambulatory Visit
Admission: RE | Admit: 2017-02-26 | Discharge: 2017-02-26 | Disposition: A | Discharge status: Home / Self Care | Payer: MEDICARE

## 2017-02-26 ENCOUNTER — Ambulatory Visit
Admission: RE | Admit: 2017-02-26 | Discharge: 2017-02-26 | Disposition: A | Discharge status: Home / Self Care | Payer: MEDICARE | Attending: Nephrology | Admitting: Nephrology

## 2017-02-26 DIAGNOSIS — Z940 Kidney transplant status: Principal | ICD-10-CM

## 2017-02-26 DIAGNOSIS — D899 Disorder involving the immune mechanism, unspecified: Secondary | ICD-10-CM

## 2017-02-26 DIAGNOSIS — Z0000 Encounter for general adult medical examination without abnormal findings: Secondary | ICD-10-CM

## 2017-02-26 LAB — CBC W/ AUTO DIFF
BASOPHILS ABSOLUTE COUNT: 0 10*9/L (ref 0.0–0.1)
EOSINOPHILS ABSOLUTE COUNT: 0.2 10*9/L (ref 0.0–0.4)
HEMATOCRIT: 32.3 % — ABNORMAL LOW (ref 36.0–46.0)
HEMOGLOBIN: 9.6 g/dL — ABNORMAL LOW (ref 12.0–16.0)
LARGE UNSTAINED CELLS: 3 % (ref 0–4)
MEAN CORPUSCULAR HEMOGLOBIN CONC: 29.7 g/dL — ABNORMAL LOW (ref 31.0–37.0)
MEAN CORPUSCULAR HEMOGLOBIN: 19.2 pg — ABNORMAL LOW (ref 26.0–34.0)
MEAN CORPUSCULAR VOLUME: 64.6 fL — ABNORMAL LOW (ref 80.0–100.0)
MEAN PLATELET VOLUME: 6.6 fL — ABNORMAL LOW (ref 7.0–10.0)
MONOCYTES ABSOLUTE COUNT: 0.3 10*9/L (ref 0.2–0.8)
PLATELET COUNT: 196 10*9/L (ref 150–440)
RED BLOOD CELL COUNT: 5 10*12/L (ref 4.00–5.20)
WBC ADJUSTED: 3.5 10*9/L — ABNORMAL LOW (ref 4.5–11.0)

## 2017-02-26 LAB — URINALYSIS
BACTERIA: NONE SEEN /HPF
BILIRUBIN UA: NEGATIVE
BLOOD UA: NEGATIVE
GLUCOSE UA: NEGATIVE
LEUKOCYTE ESTERASE UA: NEGATIVE
NITRITE UA: NEGATIVE
PH UA: 6.5 (ref 5.0–9.0)
PROTEIN UA: NEGATIVE
RBC UA: <1 /HPF (ref <4)
SPECIFIC GRAVITY UA: 1.015 (ref 1.003–1.030)
SQUAMOUS EPITHELIAL: <1 /HPF (ref 0–5)
UROBILINOGEN UA: 0.2
WBC UA: 1 /HPF (ref 0–5)

## 2017-02-26 LAB — COMPREHENSIVE METABOLIC PANEL
ALBUMIN: 4.3 g/dL (ref 3.5–5.0)
ALKALINE PHOSPHATASE: 128 U/L — ABNORMAL HIGH (ref 38–126)
ALT (SGPT): 34 U/L (ref 15–48)
ANION GAP: 10 mmol/L (ref 9–15)
AST (SGOT): 31 U/L (ref 14–38)
BILIRUBIN TOTAL: 0.5 mg/dL (ref 0.0–1.2)
BLOOD UREA NITROGEN: 19 mg/dL (ref 7–21)
BUN / CREAT RATIO: 30
CALCIUM: 11.1 mg/dL — ABNORMAL HIGH (ref 8.5–10.2)
CHLORIDE: 109 mmol/L — ABNORMAL HIGH (ref 98–107)
CO2: 22 mmol/L (ref 22.0–30.0)
CREATININE: 0.63 mg/dL (ref 0.60–1.00)
EGFR MDRD NON AF AMER: >=60 mL/min/{1.73_m2} (ref >=60)
GLUCOSE RANDOM: 89 mg/dL (ref 65–99)
POTASSIUM: 4.9 mmol/L (ref 3.5–5.0)
PROTEIN TOTAL: 7.2 g/dL (ref 6.5–8.3)
SODIUM: 141 mmol/L (ref 135–145)

## 2017-02-26 LAB — FERRITIN
FERRITIN: 1890 ng/mL — ABNORMAL HIGH (ref 3.0–151.0)
Ferritin:MCnc:Pt:Ser/Plas:Qn:: 1890 — ABNORMAL HIGH

## 2017-02-26 LAB — PHOSPHORUS: Phosphate:MCnc:Pt:Ser/Plas:Qn:: 2.6 — ABNORMAL LOW

## 2017-02-26 LAB — CMV DNA, QUANTITATIVE, PCR

## 2017-02-26 LAB — CMV QUANT: Lab: 0

## 2017-02-26 LAB — PROTEIN TOTAL: Protein:MCnc:Pt:Ser/Plas:Qn:: 7.2

## 2017-02-26 LAB — TACROLIMUS, TROUGH: Lab: 9.6

## 2017-02-26 LAB — MEAN CORPUSCULAR HEMOGLOBIN: Lab: 19.2 — ABNORMAL LOW

## 2017-02-26 LAB — IRON & TIBC
IRON SATURATION (CALC): 61 % — ABNORMAL HIGH (ref 15–50)
TRANSFERRIN: 200.3 mg/dL (ref 200.0–380.0)

## 2017-02-26 LAB — PROTEIN URINE: Protein:MCnc:Pt:Urine:Qn:: 4.5

## 2017-02-26 LAB — PARATHYROID HORMONE INTACT: Parathyrin.intact:MCnc:Pt:Ser/Plas:Qn:: 247.2 — ABNORMAL HIGH

## 2017-02-26 LAB — MAGNESIUM: Magnesium:MCnc:Pt:Ser/Plas:Qn:: 1.2 — ABNORMAL LOW

## 2017-02-26 LAB — TACROLIMUS BLOOD: Lab: 9.6

## 2017-02-26 LAB — SMEAR REVIEW

## 2017-02-26 LAB — GLUCOSE UA: Lab: NEGATIVE

## 2017-02-26 LAB — TOTAL IRON BINDING CAPACITY (CALC): Lab: 252.4

## 2017-02-26 LAB — SLIDE REVIEW

## 2017-02-26 MED ORDER — PROGRAF 0.5 MG CAPSULE
ORAL_CAPSULE | Freq: Two times a day (BID) | ORAL | 11 refills | 0 days
Start: 2017-02-26 — End: 2017-05-06

## 2017-02-26 NOTE — Unmapped (Signed)
Urine sample collected

## 2017-02-26 NOTE — Unmapped (Signed)
Pt advised to decrease prograf to 1mg  BID. fk506 9.6

## 2017-02-26 NOTE — Unmapped (Signed)
[facility] PHARMACY NOTE  02/26/2017   Lynn Erickson  [id number]    Medication changes today:   1. DC sodium bicarbonate  2. Decrease tac to 1 mg bid  3. Start vitamin D 2000 units daily    Education/Adherence tools provided today:  1.provided updated medication list    Follow up items:  1. goal of understanding indications and dosing of immunosuppression medications    Next visit with pharmacy in 1-3 months  ____________________________________________________________________    Lynn Erickson is a 51 y.o. female s/p deceased kidney transplant on 10-19-16 (Kidney) 2/2 Systemic Lupus Erythematosus.     Other PMH significant for hypertension    Seen by pharmacy today for: medication management and pill box fill and adherence education; last seen by pharmacy: April 2018    CC: no complaints    There were no vitals filed for this visit.    Allergies   Allergen Reactions   ??? Plaquenil [Hydroxychloroquine] Other (See Comments)     Toxic maculopathy   ??? Shellfish Containing Products Anaphylaxis   ??? Aspirin Rash   ??? Penicillins Rash     skin testing 12/13/2016 negative, oral challenging pending for 03/15/2017   ??? 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     Medication list includes revisions made during today???s encounter    Outpatient Encounter Prescriptions as of 02/26/2017   Medication Sig Dispense Refill   ??? carvedilol (COREG) 25 MG tablet Take 1 tablet (25 mg total) by mouth Two (2) times a day. 60 tablet 11   ??? famotidine (PEPCID) 20 MG tablet Take 1 tablet (20 mg total) by mouth nightly as needed for heartburn. 180 tablet 0   ??? magnesium oxide (MAG-OX) 400 mg tablet Take 400 mg by mouth daily.     ??? MYFORTIC 180 mg EC tablet Take 3 tablets (540 mg total) by mouth Two (2) times a day. 180 tablet 11   ??? PROGRAF 0.5 mg capsule Take 1.5mg   in the morning and 1mg  at night; DAW1; tx date 02/02/04 180 capsule 11   ??? sulfamethoxazole-trimethoprim (BACTRIM,SEPTRA) 400-80 mg per tablet Take 1 tablet (80 mg of trimethoprim total) by mouth Every Monday, Wednesday, and Friday. 12 tablet 5   ??? [DISCONTINUED] acetaminophen (TYLENOL) 325 MG tablet Take 1-2 tablets (325-650 mg total) by mouth every four (4) hours as needed. 100 tablet 0   ??? [DISCONTINUED] EPINEPHrine (EPIPEN 2-PAK) 0.3 mg/0.3 mL injection Inject 0.3 mL (0.3 mg total) into the muscle once as needed for anaphylaxis. for up to 1 dose 2 Device 12   ??? [DISCONTINUED] oxyCODONE (ROXICODONE) 5 MG immediate release tablet Take 1-2 tablets (5-10 mg total) by mouth every four (4) hours as needed. 60 tablet 0   ??? [DISCONTINUED] polyethylene glycol (MIRALAX) 17 gram packet Take 17 g by mouth daily as needed. 24 packet 0   ??? [DISCONTINUED] sodium bicarbonate 650 mg tablet Take 650 mg by mouth Two (2) times a day.     ??? [DISCONTINUED] sodium bicarbonate 650 mg tablet TAKE 1 TABLET BY MOUTH TWICE DAILY 100 tablet PRN     No facility-administered encounter medications on file as of 02/26/2017.      Induction agent : alemtuzumab    CURRENT IMMUNOSUPPRESSION: prograf 1.5 mg in the AM and 1 mg in the PM (pt takes both 1 mg and 0.5 mg capsule), prograf goal: 6-8   myfortic  540 mg PO bid     Patient is tolerating immunosuppression well    IMMUNOSUPPRESSION DRUG LEVELS:  Lab Results   Component Value Date    TACROLIMUS 9.6 02/26/2017    TACROLIMUS 9.6 02/26/2017    TACROLIMUS 8.2 02/10/2017    TACROLIMUS 8.2 02/03/2017    TACROLIMUS 7.9 01/27/2017    TACROLIMUS 6.5 11/19/2016     Prograf level is accurate 12 hour trough    Graft function: stable   DSA: ntd  Biopsies to date: ntd  WBC/ANC:  wnl       Plan: decrease tac to 1 mg bid    ID prophylaxis:   CMV Status: D+/ R+, moderate risk . CMV prophylaxis: valcyte x 3 months completed  CrCl cannot be calculated (Unknown ideal weight.).  Lab Results   Component Value Date    CMVCP <50 (H) 11/19/2016     PCP Prophylaxis: bactrim SS 1 tab MWF x 6 months.  Thrush: completed  Patient is  tolerating infectious prophylaxis well    Plan: continue bactrim per protocol    CV Prophylaxis: asa 81 mg   The 10-year ASCVD risk score Denman George DC Jr., et al., 2013) is: 1.2%  Statin therapy: Not indicated; currently on: n/a    BP: Goal < 140/90. Clinic vitals reported above  Home BP ranges: does not record, but today in clinic is well controlled  Current meds include: carvedilol 25 mg bid  Plan: continue current regimen.    Anemia:  H/H:   Lab Results   Component Value Date    HGB 9.6 (L) 02/26/2017     Lab Results   Component Value Date    HCT 32.3 (L) 02/26/2017     Iron panel:  No results found for: IRON, TIBC, FERRITIN  No results found for: LABIRON    Prior ESA use: pre- txp only    Plan: Continue to monitor.     DM:   Lab Results   Component Value Date    A1C 5.0 10/12/2016   . Goal A1c < 7  History of Dm? No  Established with endocrinologist/PCP for BG managment? No  Currently on: n/a  Plan:  monitor    Electrolytes: wnl,  Meds currently on: mag 400 mg daily  Plan: continue to monitor    BM: good BM daily  Meds currently on: none  Plan: continue to montior     Bone health:   Vitamin D Level: 22.6. Goal > 30.   Last DEXA results:   Current meds include: start vitamin d 2000 units daily   Plan: Continue to monitor    Women's/Men's Health:  Lynn Erickson is a 51 y.o. Female perimenopausal. Patient reports no men's/women's health issues  Plan: continue to monitor    Adherence: Patient has good understanding of medications; was able to independently identify names/doses of immunosuppressants and OI meds.  Patient  does fill their own pill box on a regular basis at home.  Patient brought medication card:yes  Pill box:was correct  Patient requested refills for the following meds: 0  Corrections needed in Epic medication list: 4   Plan:  provided moderate adherence counseling/intervention    Other:     Spent approximately 20 minutes on educating this patient and greater than 50% was spent in direct face to face counseling regarding post transplant medication education. Questions and concerns were address to patient's satisfaction.    Patient was reviewed with Dr. Toni Arthurs who was agreement with the stated  plan:     During this visit, the following was completed:   BG log data assessment  BP log data assessment  Labs ordered and evaluated  complex treatment plan >1 DS   Patient education was completed for 6-10 minutes     All questions/concerns were addressed to the patient's satisfaction.  __________________________________________  Noah Charon, PHARMD, CPP  [employer] [employer]PL[facility]  PAGER [phone]

## 2017-02-28 LAB — HLA DS POST TRANSPLANT
ANTI-DONOR DRW #2 MFI: 7 MFI
ANTI-DONOR HLA-A #1 MFI: 81 MFI
ANTI-DONOR HLA-A #2 MFI: 9 MFI
ANTI-DONOR HLA-B #1 MFI: 0 MFI
ANTI-DONOR HLA-B #2 MFI: 0 MFI
ANTI-DONOR HLA-C #1 MFI: 18 MFI
ANTI-DONOR HLA-C #2 MFI: 25 MFI
ANTI-DONOR HLA-DR #1 MFI: 0 MFI
ANTI-DONOR HLA-DR #2 MFI: 0 MFI
DONOR DRW ANTIGEN #2: 51
DONOR HLA-A ANTIGEN #1: 29
DONOR HLA-A ANTIGEN #2: 68
DONOR HLA-B ANTIGEN #1: 7
DONOR HLA-B ANTIGEN #2: 53
DONOR HLA-C ANTIGEN #1: 7
DONOR HLA-C ANTIGEN #2: 4
DONOR HLA-DQB ANTIGEN #1: 4
DONOR HLA-DQB ANTIGEN #2: 6
DONOR HLA-DR ANTIGEN #1: 8
DONOR HLA-DR ANTIGEN #2: 15

## 2017-02-28 LAB — FSAB CLASS 2 ANTIBODY SPECIFICITY: HLA CL2 AB RESULT: NEGATIVE

## 2017-02-28 LAB — VITAMIN D, TOTAL (25OH): Lab: 22.6

## 2017-02-28 LAB — CPRA%: Lab: 0

## 2017-02-28 LAB — DONOR HLA-DR ANTIGEN #2: Lab: 15

## 2017-02-28 LAB — HLA CL2 AB RESULT: Lab: NEGATIVE

## 2017-02-28 LAB — FSAB CLASS 1 ANTIBODY SPECIFICITY: HLA CLASS 1 ANTIBODY RESULT: NEGATIVE

## 2017-02-28 MED ORDER — CHOLECALCIFEROL (VITAMIN D3) 125 MCG (5,000 UNIT) TABLET
ORAL_TABLET | Freq: Every day | ORAL | 11 refills | 0 days | Status: CP
Start: 2017-02-28 — End: 2017-07-30

## 2017-02-28 NOTE — Unmapped (Signed)
Pt advised to start Vitamin D 5000 units daily.Per Yvette Rack

## 2017-03-02 NOTE — Unmapped (Signed)
Adult Out Patient Nutrition Education Note    Visit Type: MD Consult  Reason for Visit:  Education for Medical Nutrition Therapy  post renal transplant MNT with wt loss.     HPI: 51 YO ESRD s/p renal tx 10/12/16; obesity (wt gain), HTN, lupus; racing heart, GERD    Nutrition Hx: pt reports good appetite; denies c/s/n/v/d at this time; eating 2 meals with Ensure for lunch; limited physical activity; eating more [place] diet like beans, rice, fried foods, etc since her husband prefers these foods and his culture.     Nutritionally pertinent meds and labs reviewed.  Aware of food drug interactions immunosuppression medications.      ASSESSMENT    Nutrition-Focused Physical Findings: No fat/mm loss noted on brief visual exam. Pt does not meet with AND/ASPEN malnutrition criteria at this time.      DIAGNOSIS:  Malnutrition Assessment using AND/ASPEN Clinical Characteristics:                           Pt was assessed on 02/26/17 and does not meet ASPEN/AND guidelines for malnutrition at this time.     Overall Nutrition Impression:  Food and nutrition-related knowledge deficit as related to nutrition related health issues as evidenced by need for nutrition education per pt.     GOALS:  Food/Nutrition Knowledge, Lifestyle Modifications:       - Patient will be willing and ready to learn about prescribed post renal transplant MNT with wt loss.   - Patient will demonstrate verbal understanding of prescribed post renal transplant MNT with wt loss.   - Patient will make appropriate nutritional choices with regards to prescribed post renal transplant MNT with wt loss.       RECOMMENDATIONS AND INTERVENTIONS:  1. Patient educated on post renal transplant MNT goals and expectations, wt loss MNT, and hydration.   2. Printed information given to patient for reference      Materials Provided were:  List of recommendations    Handout explaining prescribed diet  RD contact information  Nutrition following kidney transplant  Work out to go    Expected Compliance is:  Comprehension of plan good  Readiness for change good  Ability to meet goals good    Follow-up: Next MD visit, or when consulted for wt monitoring    Length of visit was: 5 minutes    No charge  Greta Doom, MS, RDN, CSG, LDN  [phone] pager

## 2017-03-04 MED FILL — PROGRAF/1MG/CAP: PROGRAF/1MG/CAP | 30 days supply | Qty: 300 | Fill #1

## 2017-03-07 ENCOUNTER — Ambulatory Visit
Admission: RE | Admit: 2017-03-07 | Discharge: 2017-03-07 | Disposition: A | Discharge status: Home / Self Care | Payer: MEDICARE

## 2017-03-07 DIAGNOSIS — Z940 Kidney transplant status: Principal | ICD-10-CM

## 2017-03-07 DIAGNOSIS — I770 Arteriovenous fistula, acquired: Secondary | ICD-10-CM | POA: Diagnosis not present

## 2017-03-07 DIAGNOSIS — Z992 Dependence on renal dialysis: Secondary | ICD-10-CM | POA: Diagnosis not present

## 2017-03-07 NOTE — Unmapped (Signed)
Patient called requesting to have ECHO moved from 9/4 from [facility] to a Monday at [place]. Patient is r/s to Oct 1 and she is aware

## 2017-03-11 ENCOUNTER — Ambulatory Visit
Admission: RE | Admit: 2017-03-11 | Discharge: 2017-03-11 | Disposition: A | Discharge status: Home / Self Care | Payer: MEDICARE

## 2017-03-11 DIAGNOSIS — Z940 Kidney transplant status: Secondary | ICD-10-CM

## 2017-03-11 DIAGNOSIS — D899 Disorder involving the immune mechanism, unspecified: Principal | ICD-10-CM

## 2017-03-11 LAB — BASOPHILIC STIPPLING

## 2017-03-11 LAB — SLIDE REVIEW

## 2017-03-11 LAB — CBC W/ AUTO DIFF
BASOPHILS ABSOLUTE COUNT: 0 10*9/L (ref 0.0–0.1)
HEMATOCRIT: 35.5 % — ABNORMAL LOW (ref 36.0–46.0)
HEMOGLOBIN: 10.5 g/dL — ABNORMAL LOW (ref 13.5–16.0)
LYMPHOCYTES ABSOLUTE COUNT: 0.3 10*9/L — ABNORMAL LOW (ref 1.5–5.0)
MEAN CORPUSCULAR HEMOGLOBIN CONC: 29.7 g/dL — ABNORMAL LOW (ref 31.0–37.0)
MEAN CORPUSCULAR HEMOGLOBIN: 19.5 pg — ABNORMAL LOW (ref 26.0–34.0)
MEAN CORPUSCULAR VOLUME: 65.8 fL — ABNORMAL LOW (ref 80.0–100.0)
MONOCYTES ABSOLUTE COUNT: 0.4 10*9/L (ref 0.2–0.8)
NEUTROPHILS ABSOLUTE COUNT: 2.6 10*9/L (ref 2.0–7.5)
PLATELET COUNT: 178 10*9/L (ref 150–440)
RED BLOOD CELL COUNT: 5.39 10*12/L — ABNORMAL HIGH (ref 4.00–5.20)
RED CELL DISTRIBUTION WIDTH: 16.2 % — ABNORMAL HIGH (ref 12.0–15.0)
WBC ADJUSTED: 3.7 10*9/L — ABNORMAL LOW (ref 4.5–11.0)

## 2017-03-11 LAB — TACROLIMUS BLOOD: Lab: 7.8

## 2017-03-11 LAB — MICROCYTES

## 2017-03-11 LAB — MAGNESIUM: Magnesium:MCnc:Pt:Ser/Plas:Qn:: 1.4 — ABNORMAL LOW

## 2017-03-11 LAB — PHOSPHORUS: Phosphate:MCnc:Pt:Ser/Plas:Qn:: 3

## 2017-03-19 MED FILL — MYFORTIC/180MG/TAB: MYFORTIC/180MG/TAB | 30 days supply | Qty: 180 | Fill #0

## 2017-03-19 MED FILL — CARVEDILOL/25MG/TABS: CARVEDILOL/25MG/TABS | 30 days supply | Qty: 60 | Fill #0

## 2017-03-19 MED FILL — SULFAMETHOXAZOLE/TRIMETHO/400-80MG/TABS: SULFAMETHOXAZOLE/TRIMETHO/400-80MG/TABS | 28 days supply | Qty: 12 | Fill #0

## 2017-03-25 ENCOUNTER — Ambulatory Visit
Admission: RE | Admit: 2017-03-25 | Discharge: 2017-03-25 | Disposition: A | Discharge status: Home / Self Care | Payer: MEDICARE

## 2017-03-25 DIAGNOSIS — Z940 Kidney transplant status: Secondary | ICD-10-CM

## 2017-03-25 DIAGNOSIS — D899 Disorder involving the immune mechanism, unspecified: Principal | ICD-10-CM

## 2017-03-25 LAB — BASIC METABOLIC PANEL
ANION GAP: 10 mmol/L (ref 9–15)
BLOOD UREA NITROGEN: 22 mg/dL — ABNORMAL HIGH (ref 7–21)
BUN / CREAT RATIO: 31
CALCIUM: 11.5 mg/dL — ABNORMAL HIGH (ref 8.5–10.2)
CHLORIDE: 112 mmol/L — ABNORMAL HIGH (ref 98–107)
CO2: 19 mmol/L — ABNORMAL LOW (ref 22.0–30.0)
CREATININE: 0.7 mg/dL (ref 0.60–1.00)
EGFR MDRD AF AMER: >=60 mL/min/{1.73_m2} (ref >=60)
POTASSIUM: 5 mmol/L (ref 3.5–5.0)
SODIUM: 141 mmol/L (ref 135–145)

## 2017-03-25 LAB — CBC W/ AUTO DIFF
BASOPHILS ABSOLUTE COUNT: 0 10*9/L (ref 0.0–0.1)
EOSINOPHILS ABSOLUTE COUNT: 0.4 10*9/L (ref 0.0–0.4)
HEMATOCRIT: 33.9 % — ABNORMAL LOW (ref 36.0–46.0)
HEMOGLOBIN: 10.3 g/dL — ABNORMAL LOW (ref 13.5–16.0)
LARGE UNSTAINED CELLS: 4 % (ref 0–4)
MEAN CORPUSCULAR HEMOGLOBIN CONC: 30.3 g/dL — ABNORMAL LOW (ref 31.0–37.0)
MEAN CORPUSCULAR HEMOGLOBIN: 20 pg — ABNORMAL LOW (ref 26.0–34.0)
MEAN CORPUSCULAR VOLUME: 66 fL — ABNORMAL LOW (ref 80.0–100.0)
MEAN PLATELET VOLUME: 7.6 fL (ref 7.0–10.0)
MONOCYTES ABSOLUTE COUNT: 0.3 10*9/L (ref 0.2–0.8)
PLATELET COUNT: 176 10*9/L (ref 150–440)
RED BLOOD CELL COUNT: 5.13 10*12/L (ref 4.00–5.20)
RED CELL DISTRIBUTION WIDTH: 16.3 % — ABNORMAL HIGH (ref 12.0–15.0)
WBC ADJUSTED: 3.6 10*9/L — ABNORMAL LOW (ref 4.5–11.0)

## 2017-03-25 LAB — MAGNESIUM: Magnesium:MCnc:Pt:Ser/Plas:Qn:: 1.3 — ABNORMAL LOW

## 2017-03-25 LAB — PHOSPHORUS: Phosphate:MCnc:Pt:Ser/Plas:Qn:: 2.9

## 2017-03-25 LAB — BLOOD UREA NITROGEN: Urea nitrogen:MCnc:Pt:Ser/Plas:Qn:: 22 — ABNORMAL HIGH

## 2017-03-25 LAB — MEAN CORPUSCULAR HEMOGLOBIN: Lab: 20 — ABNORMAL LOW

## 2017-03-26 LAB — TACROLIMUS BLOOD: Lab: 10

## 2017-04-03 NOTE — Unmapped (Signed)
Received medications from hospital pharmacy for last fill. Wants a call back next week. Moving call to October.

## 2017-04-07 ENCOUNTER — Ambulatory Visit
Admission: RE | Admit: 2017-04-07 | Discharge: 2017-04-07 | Disposition: A | Discharge status: Home / Self Care | Payer: MEDICARE

## 2017-04-07 DIAGNOSIS — Z940 Kidney transplant status: Secondary | ICD-10-CM

## 2017-04-07 DIAGNOSIS — I38 Endocarditis, valve unspecified: Secondary | ICD-10-CM

## 2017-04-07 DIAGNOSIS — D899 Disorder involving the immune mechanism, unspecified: Principal | ICD-10-CM

## 2017-04-07 DIAGNOSIS — I313 Pericardial effusion (noninflammatory): Secondary | ICD-10-CM | POA: Diagnosis not present

## 2017-04-07 DIAGNOSIS — I340 Nonrheumatic mitral (valve) insufficiency: Secondary | ICD-10-CM | POA: Diagnosis not present

## 2017-04-07 LAB — CBC W/ AUTO DIFF
BASOPHILS ABSOLUTE COUNT: 0 10*9/L (ref 0.0–0.1)
EOSINOPHILS ABSOLUTE COUNT: 0.2 10*9/L (ref 0.0–0.4)
HEMATOCRIT: 33.8 % — ABNORMAL LOW (ref 36.0–46.0)
HEMOGLOBIN: 10.2 g/dL — ABNORMAL LOW (ref 13.5–16.0)
LYMPHOCYTES ABSOLUTE COUNT: 0.5 10*9/L — ABNORMAL LOW (ref 1.5–5.0)
MEAN CORPUSCULAR HEMOGLOBIN CONC: 30.1 g/dL — ABNORMAL LOW (ref 31.0–37.0)
MEAN CORPUSCULAR HEMOGLOBIN: 20 pg — ABNORMAL LOW (ref 26.0–34.0)
MEAN CORPUSCULAR VOLUME: 66.3 fL — ABNORMAL LOW (ref 80.0–100.0)
MONOCYTES ABSOLUTE COUNT: 0.4 10*9/L (ref 0.2–0.8)
NEUTROPHILS ABSOLUTE COUNT: 1.9 10*9/L — ABNORMAL LOW (ref 2.0–7.5)
PLATELET COUNT: 168 10*9/L (ref 150–440)
RED BLOOD CELL COUNT: 5.09 10*12/L (ref 4.00–5.20)
RED CELL DISTRIBUTION WIDTH: 16.1 % — ABNORMAL HIGH (ref 12.0–15.0)
WBC ADJUSTED: 3.1 10*9/L — ABNORMAL LOW (ref 4.5–11.0)

## 2017-04-07 LAB — TACROLIMUS BLOOD: Lab: 8.5

## 2017-04-07 LAB — BASIC METABOLIC PANEL
ANION GAP: 8 mmol/L — ABNORMAL LOW (ref 9–15)
BLOOD UREA NITROGEN: 20 mg/dL (ref 7–21)
CALCIUM: 11.3 mg/dL — ABNORMAL HIGH (ref 8.5–10.2)
CHLORIDE: 114 mmol/L — ABNORMAL HIGH (ref 98–107)
CO2: 20 mmol/L — ABNORMAL LOW (ref 22.0–30.0)
CREATININE: 0.68 mg/dL (ref 0.60–1.00)
EGFR MDRD AF AMER: >=60 mL/min/{1.73_m2} (ref >=60)
EGFR MDRD NON AF AMER: >=60 mL/min/{1.73_m2} (ref >=60)
GLUCOSE RANDOM: 84 mg/dL (ref 65–179)
POTASSIUM: 4.9 mmol/L (ref 3.5–5.0)
SODIUM: 142 mmol/L (ref 135–145)

## 2017-04-07 LAB — SLIDE REVIEW

## 2017-04-07 LAB — LARGE UNSTAINED CELLS: Lab: 5 — ABNORMAL HIGH

## 2017-04-07 LAB — PHOSPHORUS: Phosphate:MCnc:Pt:Ser/Plas:Qn:: 3

## 2017-04-07 LAB — CREATININE: Creatinine:MCnc:Pt:Ser/Plas:Qn:: 0.68

## 2017-04-07 LAB — MAGNESIUM: Magnesium:MCnc:Pt:Ser/Plas:Qn:: 1.4 — ABNORMAL LOW

## 2017-04-07 LAB — SMEAR REVIEW

## 2017-04-10 MED FILL — MYFORTIC/180MG/TAB: MYFORTIC/180MG/TAB | 30 days supply | Qty: 180 | Fill #0

## 2017-04-10 MED FILL — SMZ-TMP/400-80MG/TAB: SMZ-TMP/400-80MG/TAB | 28 days supply | Qty: 12 | Fill #0

## 2017-04-10 NOTE — Unmapped (Signed)
[facility] Pharmacy Refill Coordination Note  Specialty Medication(s): MYFORTIC 180MG    Additional Medications shipped: CARVEDILOL, SMZ-TMP    Lynn Erickson, DOB: 07-31-1965  Phone: [phone] (home) , Alternate phone contact: N/A  Phone or address changes today?: No  All above HIPAA information was verified with patient.  Shipping Address: [address]  [address]  [place] [address]   Insurance changes? No    Completed refill call assessment today to schedule patient's medication shipment from the [facility] [phone]).      Confirmed the medication and dosage are correct and have not changed: Yes, regimen is correct and unchanged.    Confirmed patient started or stopped the following medications in the past month:  No, there are no changes reported at this time.    Are you tolerating your medication?:  Lynn Erickson reports tolerating the medication.    ADHERENCE    (Below is required for Medicare Part B or Transplant patients only - per drug):   How many tablets were dispensed last month: 180   Patient currently has 24 remaining.    Did you miss any doses in the past 4 weeks? No missed doses reported.    FINANCIAL/SHIPPING    Delivery Scheduled: Yes, Expected medication delivery date: 04/11/17     Lynn Erickson did not have any additional questions at this time.    Delivery address validated in FSI scheduling system: Yes, address listed in FSI is correct.    We will follow up with patient monthly for standard refill processing and delivery.      Thank you,  Marletta Lor   [facility] Shared [facility] Specialty Pharmacist

## 2017-04-11 MED FILL — CARVEDILOL/25MG/TABS: CARVEDILOL/25MG/TABS | 30 days supply | Qty: 60 | Fill #0

## 2017-04-21 ENCOUNTER — Ambulatory Visit
Admission: RE | Admit: 2017-04-21 | Discharge: 2017-04-21 | Disposition: A | Discharge status: Home / Self Care | Payer: MEDICARE

## 2017-04-21 DIAGNOSIS — Z940 Kidney transplant status: Principal | ICD-10-CM

## 2017-04-21 DIAGNOSIS — D899 Disorder involving the immune mechanism, unspecified: Secondary | ICD-10-CM

## 2017-04-21 LAB — SLIDE REVIEW

## 2017-04-21 LAB — CBC W/ AUTO DIFF
BASOPHILS ABSOLUTE COUNT: 0 10*9/L (ref 0.0–0.1)
EOSINOPHILS ABSOLUTE COUNT: 0.2 10*9/L (ref 0.0–0.4)
HEMOGLOBIN: 9.9 g/dL — ABNORMAL LOW (ref 13.5–16.0)
LARGE UNSTAINED CELLS: 5 % — ABNORMAL HIGH (ref 0–4)
LYMPHOCYTES ABSOLUTE COUNT: 0.7 10*9/L — ABNORMAL LOW (ref 1.5–5.0)
MEAN CORPUSCULAR HEMOGLOBIN CONC: 30 g/dL — ABNORMAL LOW (ref 31.0–37.0)
MEAN CORPUSCULAR HEMOGLOBIN: 19.5 pg — ABNORMAL LOW (ref 26.0–34.0)
MEAN CORPUSCULAR VOLUME: 65.1 fL — ABNORMAL LOW (ref 80.0–100.0)
MEAN PLATELET VOLUME: 8 fL (ref 7.0–10.0)
NEUTROPHILS ABSOLUTE COUNT: 1.8 10*9/L — ABNORMAL LOW (ref 2.0–7.5)
PLATELET COUNT: 166 10*9/L (ref 150–440)
RED BLOOD CELL COUNT: 5.07 10*12/L (ref 4.00–5.20)
RED CELL DISTRIBUTION WIDTH: 16.1 % — ABNORMAL HIGH (ref 12.0–15.0)
WBC ADJUSTED: 3.2 10*9/L — ABNORMAL LOW (ref 4.5–11.0)

## 2017-04-21 LAB — BASIC METABOLIC PANEL
ANION GAP: 7 mmol/L — ABNORMAL LOW (ref 9–15)
BLOOD UREA NITROGEN: 20 mg/dL (ref 7–21)
BUN / CREAT RATIO: 29
CALCIUM: 11.2 mg/dL — ABNORMAL HIGH (ref 8.5–10.2)
CHLORIDE: 111 mmol/L — ABNORMAL HIGH (ref 98–107)
CO2: 24 mmol/L (ref 22.0–30.0)
EGFR MDRD AF AMER: >=60 mL/min/{1.73_m2} (ref >=60)
EGFR MDRD NON AF AMER: >=60 mL/min/{1.73_m2} (ref >=60)
GLUCOSE RANDOM: 86 mg/dL (ref 65–179)
POTASSIUM: 4.9 mmol/L (ref 3.5–5.0)
SODIUM: 142 mmol/L (ref 135–145)

## 2017-04-21 LAB — TACROLIMUS BLOOD: Lab: 10.4

## 2017-04-21 LAB — MAGNESIUM: Magnesium:MCnc:Pt:Ser/Plas:Qn:: 1.4 — ABNORMAL LOW

## 2017-04-21 LAB — PHOSPHORUS: Phosphate:MCnc:Pt:Ser/Plas:Qn:: 2.8 — ABNORMAL LOW

## 2017-04-21 LAB — ANION GAP: Anion gap 3:SCnc:Pt:Ser/Plas:Qn:: 7 — ABNORMAL LOW

## 2017-04-21 LAB — HYPOCHROMIA

## 2017-04-21 LAB — BASOPHILIC STIPPLING

## 2017-05-05 ENCOUNTER — Ambulatory Visit
Admission: RE | Admit: 2017-05-05 | Discharge: 2017-05-05 | Disposition: A | Discharge status: Home / Self Care | Payer: MEDICARE

## 2017-05-05 DIAGNOSIS — D899 Disorder involving the immune mechanism, unspecified: Principal | ICD-10-CM

## 2017-05-05 DIAGNOSIS — Z940 Kidney transplant status: Secondary | ICD-10-CM

## 2017-05-05 LAB — BASIC METABOLIC PANEL
ANION GAP: 8 mmol/L — ABNORMAL LOW (ref 9–15)
BUN / CREAT RATIO: 32
CALCIUM: 11.2 mg/dL — ABNORMAL HIGH (ref 8.5–10.2)
CHLORIDE: 110 mmol/L — ABNORMAL HIGH (ref 98–107)
CO2: 23 mmol/L (ref 22.0–30.0)
CREATININE: 0.66 mg/dL (ref 0.60–1.00)
EGFR MDRD AF AMER: >=60 mL/min/{1.73_m2} (ref >=60)
EGFR MDRD NON AF AMER: >=60 mL/min/{1.73_m2} (ref >=60)
GLUCOSE RANDOM: 95 mg/dL (ref 65–179)
POTASSIUM: 4.6 mmol/L (ref 3.5–5.0)
SODIUM: 141 mmol/L (ref 135–145)

## 2017-05-05 LAB — CBC W/ AUTO DIFF
BASOPHILS ABSOLUTE COUNT: 0 10*9/L (ref 0.0–0.1)
EOSINOPHILS ABSOLUTE COUNT: 0.3 10*9/L (ref 0.0–0.4)
HEMATOCRIT: 34.8 % — ABNORMAL LOW (ref 36.0–46.0)
HEMOGLOBIN: 10.3 g/dL — ABNORMAL LOW (ref 13.5–16.0)
LARGE UNSTAINED CELLS: 3 % (ref 0–4)
LYMPHOCYTES ABSOLUTE COUNT: 0.6 10*9/L — ABNORMAL LOW (ref 1.5–5.0)
MEAN CORPUSCULAR HEMOGLOBIN CONC: 29.6 g/dL — ABNORMAL LOW (ref 31.0–37.0)
MEAN CORPUSCULAR HEMOGLOBIN: 19.5 pg — ABNORMAL LOW (ref 26.0–34.0)
MEAN CORPUSCULAR VOLUME: 65.7 fL — ABNORMAL LOW (ref 80.0–100.0)
MEAN PLATELET VOLUME: 8.4 fL (ref 7.0–10.0)
MONOCYTES ABSOLUTE COUNT: 0.4 10*9/L (ref 0.2–0.8)
NEUTROPHILS ABSOLUTE COUNT: 3 10*9/L (ref 2.0–7.5)
PLATELET COUNT: 181 10*9/L (ref 150–440)
RED BLOOD CELL COUNT: 5.3 10*12/L — ABNORMAL HIGH (ref 4.00–5.20)
RED CELL DISTRIBUTION WIDTH: 16.3 % — ABNORMAL HIGH (ref 12.0–15.0)

## 2017-05-05 LAB — BASOPHILIC STIPPLING

## 2017-05-05 LAB — SLIDE REVIEW

## 2017-05-05 LAB — LYMPHOCYTES ABSOLUTE COUNT: Lab: 0.6 — ABNORMAL LOW

## 2017-05-05 LAB — PHOSPHORUS: Phosphate:MCnc:Pt:Ser/Plas:Qn:: 3.2

## 2017-05-05 LAB — EGFR MDRD AF AMER: Glomerular filtration rate/1.73 sq M.predicted.black:ArVRat:Pt:Ser/Plas/Bld:Qn:Creatinine-based formula (MDRD): >=60

## 2017-05-05 LAB — MAGNESIUM: Magnesium:MCnc:Pt:Ser/Plas:Qn:: 1.6

## 2017-05-06 ENCOUNTER — Ambulatory Visit
Admission: RE | Admit: 2017-05-06 | Discharge: 2017-05-06 | Disposition: A | Discharge status: Home / Self Care | Payer: MEDICARE

## 2017-05-06 ENCOUNTER — Ambulatory Visit
Admission: RE | Admit: 2017-05-06 | Discharge: 2017-05-06 | Disposition: A | Discharge status: Home / Self Care | Payer: MEDICARE | Attending: Nephrology | Admitting: Nephrology

## 2017-05-06 DIAGNOSIS — Z940 Kidney transplant status: Principal | ICD-10-CM

## 2017-05-06 DIAGNOSIS — M79672 Pain in left foot: Secondary | ICD-10-CM

## 2017-05-06 DIAGNOSIS — M79671 Pain in right foot: Principal | ICD-10-CM

## 2017-05-06 DIAGNOSIS — Z0000 Encounter for general adult medical examination without abnormal findings: Secondary | ICD-10-CM

## 2017-05-06 LAB — URINALYSIS
BACTERIA: NONE SEEN /HPF
BILIRUBIN UA: NEGATIVE
BLOOD UA: NEGATIVE
GLUCOSE UA: NEGATIVE
KETONES UA: NEGATIVE
LEUKOCYTE ESTERASE UA: NEGATIVE
NITRITE UA: NEGATIVE
PROTEIN UA: NEGATIVE
RBC UA: <1 /HPF (ref <4)
SPECIFIC GRAVITY UA: 1.008 (ref 1.003–1.030)
SQUAMOUS EPITHELIAL: <1 /HPF (ref 0–5)
UROBILINOGEN UA: 0.2
WBC UA: <1 /HPF (ref 0–5)

## 2017-05-06 LAB — CREATININE, URINE: Lab: 37.5

## 2017-05-06 LAB — RBC UA: Lab: <1

## 2017-05-06 LAB — TACROLIMUS BLOOD: Lab: 6.8

## 2017-05-06 LAB — PROTEIN / CREATININE RATIO, URINE: PROTEIN URINE: <4 mg/dL

## 2017-05-06 MED ORDER — PROGRAF 0.5 MG CAPSULE
ORAL_CAPSULE | 11 refills | 0.00000 days | Status: CP
Start: 2017-05-06 — End: 2017-11-06

## 2017-05-06 MED ORDER — FAMOTIDINE 20 MG TABLET
ORAL_TABLET | Freq: Every evening | ORAL | 11 refills | 0.00000 days | Status: CP | PRN
Start: 2017-05-06 — End: 2017-05-06

## 2017-05-06 MED ORDER — PROGRAF 0.5 MG CAPSULE: capsule | 11 refills | 0 days | Status: AC

## 2017-05-06 MED ORDER — FAMOTIDINE 20 MG TABLET: 20 mg | tablet | Freq: Two times a day (BID) | 11 refills | 0 days | Status: AC

## 2017-05-06 MED ORDER — FAMOTIDINE 20 MG TABLET: tablet | 11 refills | 0 days | Status: AC

## 2017-05-06 MED FILL — FAMOTIDINE/20MG/TAB: FAMOTIDINE/20MG/TAB | 30 days supply | Qty: 60 | Fill #0

## 2017-05-06 MED FILL — PROGRAF/0.5MG/CAP: PROGRAF/0.5MG/CAP | 30 days supply | Qty: 90 | Fill #0

## 2017-05-06 NOTE — Unmapped (Signed)
[facility] [place] transplant nephrology clinic visit    assessment and plan  1. s/p kidney transplant 10/12/16. baseline creatinine 0.6-0.8 mg/dl. no proteinuria. no donor specific hla ab detected. tacrolimus decr to 1mg /0.5mg  q.am/pm re: 12hr lvl 6-8 ng/ml.  2. hypertension. blood pressure goal < 130/80 mmhg.   3. +/-congestive heart failure. +[place] transplant surgery referral for av fistula ligation. furosmide 20mg  prn.   4. secondary/tertiary hyperparathyroidism. hold vitamin d d/t hypercalcemia.  5. preventive medicine. influenza vaccination deferred by patient d/t upper respiratory tract symptoms; strongly recommended at next clinic appt. ppsv23 pneumococcal, recombinant zoster recommended. pcv pneumococcal '17.    history of present illness    [name] is a 51 yr old woman seen in follow up post kidney transplant 10/12/2016. +viral upper respiratory tract symptoms incl rhinorrhea, sinus congestion, nonprod cough and headache. no fevers chills or sweats. no chest pain palpitations or shortness of breath at rest. +dyspnea with mod exertion. +/-orthopnea. no lower ext edema. +bilateral foot pain with prolonged standing/walking. appetite nl. +20lb weight gain/past 33m. no n/v/d. no urine symptoms. all other systems reviewed and negative x10 systems.    past medical hx:  1. 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 baseline creatinine 0.6-0.8 mg/dl.  2. systemic lupus erythematosus   3. hypertension  4. secondary/tertiary hyperparathyroidism    past surgical hx: left upper ext av graft x2 '07-08. bilateral tubal ligation. kidney transplant '18.  ??  allergies: penicillin, vancomycin, aspirin, shellfish, hydroxychloroquine.  ??  medications: tacrolimus 1mg  bid, mycophenolate sodium 540mg  bid, carvedilol 25mg  bid, vitamin d3 5000iu daily, mg oxide 400mg  daily, famotidine 20mg  prn.  ??  soc hx: married x4 children. no smoking hx     physical exam: t97.3 p78 bp122/62 wt62.2kg bmi 31.9. wd/wn woman nl/appropriate affect and mood. nl sclera anicteric. mmm no thrush. neck supple no palpable ln. heart rrr nl s1s2 no m/r/g. lungs clear bilateral. abd soft nt/nd. no lower ext edema. msk no synovitis/tophi. left upper ext av fistula +bruit/thrill. skin no rash. neuro alert oriented non focal exam.    pvls 9/18: +left upper ext av fistula blood flow rate 2195 ml/min. echocardiogram 10/18: nl lvef 60-65%. +diastolic lv dysfunction. nl pulm arterial systolic blood pressure.    labs 05/05/17: wbc4.5 hgb10.3 hct34.8 plts181. na141 k4.6 cl110 bicarb23 bun21 cr0.7 glc95 ca11.2 mg1.6 phos3.2. tacrolimus lvl 6.8 ng/ml. urine protein not detected. urine cytology: no malignant/decoy cells.    scribe's attestation: Elwyn Lade, MD obtained and performed the history, physical exam and medical decision making elements that were entered into the chart.  Signed by [place] Ormond Foster, Scribe, on May 06, 2017 12:33 PM.    ----------------------------------------------------------------------------------------------------------------------  May 18, 2017 10:30 PM. documentation assistance provided by the scribe. i was present during the time the encounter was recorded. the information recorded by the scribe was done at my direction and has been reviewed and validated by me.  ----------------------------------------------------------------------------------------------------------------------

## 2017-05-06 NOTE — Unmapped (Signed)
Urine specimen collected today at visit.  C/o runny nose, watery eyes, cough, HA only on left side. Refused flu vaccine at today's visit.

## 2017-05-14 NOTE — Unmapped (Signed)
[facility] Specialty Pharmacy Refill Coordination Note  Specialty Medication(s): MAGOX 400  MYFORTIC 180  CARVEDILOL 25      Lynn Erickson, DOB: May 15, 1966  Phone: [phone] (home) , Alternate phone contact: N/A  Phone or address changes today?: No  All above HIPAA information was verified with patient.  Shipping Address: [address]  [address]  [place] [address]   Insurance changes? No    Completed refill call assessment today to schedule patient's medication shipment from the [facility] [phone]).      Confirmed the medication and dosage are correct and have not changed: Yes, regimen is correct and unchanged.    Confirmed patient started or stopped the following medications in the past month:  No, there are no changes reported at this time.    Are you tolerating your medication?:  Lynn Erickson reports tolerating the medication.    ADHERENCE    Mycophenolic Acid 180 mg   Quantity filled last month: 180   # of tablets left on hand: 30      Did you miss any doses in the past 4 weeks? No missed doses reported.    FINANCIAL/SHIPPING    Delivery Scheduled: Yes, Expected medication delivery date: 05/16/17     Ortha did not have any additional questions at this time.    Delivery address validated in FSI scheduling system: Yes, address listed in FSI is correct.    We will follow up with patient monthly for standard refill processing and delivery.      Thank you,  Westley Gambles   [facility] Shared [facility] Specialty Technician

## 2017-05-15 MED FILL — MAGNESIUM OXIDE/400MG/TABS: MAGNESIUM OXIDE/400MG/TABS | 60 days supply | Qty: 120 | Fill #1

## 2017-05-15 MED FILL — MYFORTIC/180MG/TAB: MYFORTIC/180MG/TAB | 30 days supply | Qty: 180 | Fill #1

## 2017-05-15 MED FILL — CARVEDILOL/25MG/TABS: CARVEDILOL/25MG/TABS | 30 days supply | Qty: 60 | Fill #1

## 2017-05-20 ENCOUNTER — Ambulatory Visit
Admission: RE | Admit: 2017-05-20 | Discharge: 2017-05-20 | Disposition: A | Discharge status: Home / Self Care | Payer: MEDICARE

## 2017-05-20 DIAGNOSIS — Z940 Kidney transplant status: Secondary | ICD-10-CM

## 2017-05-20 DIAGNOSIS — D899 Disorder involving the immune mechanism, unspecified: Principal | ICD-10-CM

## 2017-05-20 LAB — CBC W/ AUTO DIFF
BASOPHILS ABSOLUTE COUNT: 0 10*9/L (ref 0.0–0.1)
EOSINOPHILS ABSOLUTE COUNT: 0.2 10*9/L (ref 0.0–0.4)
HEMATOCRIT: 33.5 % — ABNORMAL LOW (ref 36.0–46.0)
LYMPHOCYTES ABSOLUTE COUNT: 0.7 10*9/L — ABNORMAL LOW (ref 1.5–5.0)
MEAN CORPUSCULAR HEMOGLOBIN CONC: 30.2 g/dL — ABNORMAL LOW (ref 31.0–37.0)
MEAN CORPUSCULAR HEMOGLOBIN: 19.7 pg — ABNORMAL LOW (ref 26.0–34.0)
MEAN CORPUSCULAR VOLUME: 65.2 fL — ABNORMAL LOW (ref 80.0–100.0)
MEAN PLATELET VOLUME: 8.4 fL (ref 7.0–10.0)
MONOCYTES ABSOLUTE COUNT: 0.4 10*9/L (ref 0.2–0.8)
NEUTROPHILS ABSOLUTE COUNT: 2.2 10*9/L (ref 2.0–7.5)
PLATELET COUNT: 188 10*9/L (ref 150–440)
RED BLOOD CELL COUNT: 5.13 10*12/L (ref 4.00–5.20)
RED CELL DISTRIBUTION WIDTH: 16.2 % — ABNORMAL HIGH (ref 12.0–15.0)

## 2017-05-20 LAB — COMPREHENSIVE METABOLIC PANEL
ALBUMIN: 4.5 g/dL (ref 3.5–5.0)
ALKALINE PHOSPHATASE: 146 U/L — ABNORMAL HIGH (ref 38–126)
ANION GAP: 9 mmol/L (ref 9–15)
AST (SGOT): 33 U/L (ref 14–38)
BLOOD UREA NITROGEN: 24 mg/dL — ABNORMAL HIGH (ref 7–21)
CALCIUM: 11.3 mg/dL — ABNORMAL HIGH (ref 8.5–10.2)
CHLORIDE: 110 mmol/L — ABNORMAL HIGH (ref 98–107)
CO2: 22 mmol/L (ref 22.0–30.0)
CREATININE: 0.69 mg/dL (ref 0.60–1.00)
EGFR MDRD AF AMER: >=60 mL/min/{1.73_m2} (ref >=60)
EGFR MDRD NON AF AMER: >=60 mL/min/{1.73_m2} (ref >=60)
GLUCOSE RANDOM: 87 mg/dL (ref 65–179)
POTASSIUM: 4.8 mmol/L (ref 3.5–5.0)
PROTEIN TOTAL: 7.2 g/dL (ref 6.5–8.3)
SODIUM: 141 mmol/L (ref 135–145)

## 2017-05-20 LAB — CALCIUM: Calcium:MCnc:Pt:Ser/Plas:Qn:: 11.3 — ABNORMAL HIGH

## 2017-05-20 LAB — BUN / CREAT RATIO: Urea nitrogen/Creatinine:MRto:Pt:Ser/Plas:Qn:: 35

## 2017-05-20 LAB — PARATHYROID HOMONE (PTH): PARATHYROID HORMONE INTACT: 275.7 pg/mL — ABNORMAL HIGH (ref 12.0–72.0)

## 2017-05-20 LAB — TACROLIMUS BLOOD: Lab: 6.9

## 2017-05-20 LAB — SLIDE REVIEW

## 2017-05-20 LAB — MAGNESIUM: Magnesium:MCnc:Pt:Ser/Plas:Qn:: 1.4 — ABNORMAL LOW

## 2017-05-20 LAB — PHOSPHORUS: Phosphate:MCnc:Pt:Ser/Plas:Qn:: 3.8

## 2017-05-20 LAB — SMEAR REVIEW

## 2017-05-20 LAB — RED CELL DISTRIBUTION WIDTH: Lab: 16.2 — ABNORMAL HIGH

## 2017-05-21 LAB — CMV DNA, QUANTITATIVE, PCR: CMV VIRAL LD: NOT DETECTED

## 2017-05-21 LAB — CMV VIRAL LD: Lab: NOT DETECTED

## 2017-05-27 LAB — BK VIRUS QUANTITATIVE PCR, BLOOD: BK BLOOD RESULT: NOT DETECTED

## 2017-05-27 LAB — BK BLOOD LOG(10): Lab: 0

## 2017-06-02 LAB — HLA DS POST TRANSPLANT
ANTI-DONOR DRW #1 MFI: 0 MFI
ANTI-DONOR HLA-A #1 MFI: 0 MFI
ANTI-DONOR HLA-B #1 MFI: 0 MFI
ANTI-DONOR HLA-B #2 MFI: 0 MFI
ANTI-DONOR HLA-C #1 MFI: 0 MFI
ANTI-DONOR HLA-C #2 MFI: 0 MFI
ANTI-DONOR HLA-DQA #2 MFI: 86 MFI
ANTI-DONOR HLA-DQB #1 MFI: 0 MFI
ANTI-DONOR HLA-DQB #2 MFI: 73 MFI
ANTI-DONOR HLA-DR #1 MFI: 0 MFI
ANTI-DONOR HLA-DR #2 MFI: 0 MFI
DONOR DRW ANTIGEN #1: 51
DONOR HLA-A ANTIGEN #1: 29
DONOR HLA-A ANTIGEN #2: 68
DONOR HLA-B ANTIGEN #2: 53
DONOR HLA-C ANTIGEN #1: 7
DONOR HLA-C ANTIGEN #2: 4
DONOR HLA-DQB ANTIGEN #1: 4
DONOR HLA-DQB ANTIGEN #2: 6
DONOR HLA-DR ANTIGEN #1: 8
DONOR HLA-DR ANTIGEN #2: 15

## 2017-06-02 LAB — DONOR HLA-DR ANTIGEN #2: Lab: 15

## 2017-06-02 LAB — CPRA%: Lab: 0

## 2017-06-02 LAB — HLA CLASS 1 ANTIBODY: Lab: 0

## 2017-06-02 LAB — FSAB CLASS 2 ANTIBODY SPECIFICITY: HLA CL2 AB RESULT: NEGATIVE

## 2017-06-02 LAB — FSAB CLASS 1 ANTIBODY SPECIFICITY

## 2017-06-04 ENCOUNTER — Ambulatory Visit
Admission: RE | Admit: 2017-06-04 | Discharge: 2017-06-04 | Disposition: A | Discharge status: Home / Self Care | Payer: MEDICARE

## 2017-06-04 DIAGNOSIS — D899 Disorder involving the immune mechanism, unspecified: Principal | ICD-10-CM

## 2017-06-04 DIAGNOSIS — Z940 Kidney transplant status: Secondary | ICD-10-CM

## 2017-06-04 LAB — CBC W/ AUTO DIFF
BASOPHILS ABSOLUTE COUNT: 0 10*9/L (ref 0.0–0.1)
EOSINOPHILS ABSOLUTE COUNT: 0.2 10*9/L (ref 0.0–0.4)
HEMATOCRIT: 33.8 % — ABNORMAL LOW (ref 36.0–46.0)
LARGE UNSTAINED CELLS: 3 % (ref 0–4)
LYMPHOCYTES ABSOLUTE COUNT: 0.8 10*9/L — ABNORMAL LOW (ref 1.5–5.0)
MEAN CORPUSCULAR HEMOGLOBIN CONC: 30.9 g/dL — ABNORMAL LOW (ref 31.0–37.0)
MEAN CORPUSCULAR HEMOGLOBIN: 20 pg — ABNORMAL LOW (ref 26.0–34.0)
MEAN CORPUSCULAR VOLUME: 64.7 fL — ABNORMAL LOW (ref 80.0–100.0)
MEAN PLATELET VOLUME: 9.2 fL (ref 7.0–10.0)
NEUTROPHILS ABSOLUTE COUNT: 3.4 10*9/L (ref 2.0–7.5)
PLATELET COUNT: 171 10*9/L (ref 150–440)
RED BLOOD CELL COUNT: 5.22 10*12/L — ABNORMAL HIGH (ref 4.00–5.20)
RED CELL DISTRIBUTION WIDTH: 16.5 % — ABNORMAL HIGH (ref 12.0–15.0)
WBC ADJUSTED: 5 10*9/L (ref 4.5–11.0)

## 2017-06-04 LAB — POLYCHROMASIA

## 2017-06-04 LAB — BASIC METABOLIC PANEL
ANION GAP: 9 mmol/L (ref 9–15)
BLOOD UREA NITROGEN: 27 mg/dL — ABNORMAL HIGH (ref 7–21)
BUN / CREAT RATIO: 40
CALCIUM: 11.4 mg/dL — ABNORMAL HIGH (ref 8.5–10.2)
CHLORIDE: 111 mmol/L — ABNORMAL HIGH (ref 98–107)
CO2: 22 mmol/L (ref 22.0–30.0)
CREATININE: 0.68 mg/dL (ref 0.60–1.00)
EGFR MDRD AF AMER: >=60 mL/min/{1.73_m2} (ref >=60)
GLUCOSE RANDOM: 98 mg/dL (ref 65–179)
POTASSIUM: 4.8 mmol/L (ref 3.5–5.0)
SODIUM: 142 mmol/L (ref 135–145)

## 2017-06-04 LAB — TACROLIMUS BLOOD: Lab: 6.6

## 2017-06-04 LAB — CO2: Carbon dioxide:SCnc:Pt:Ser/Plas:Qn:: 22

## 2017-06-04 LAB — MAGNESIUM: Magnesium:MCnc:Pt:Ser/Plas:Qn:: 1.5 — ABNORMAL LOW

## 2017-06-04 LAB — HYPOCHROMIA

## 2017-06-04 LAB — PHOSPHORUS: Phosphate:MCnc:Pt:Ser/Plas:Qn:: 3.3

## 2017-06-13 NOTE — Unmapped (Addendum)
[facility] Specialty Pharmacy Refill and Clinical Coordination Note  Medication(s): CARVEDILOL, MYFORTIC 180, PROGRAF 0.5    Lynn Erickson, DOB: 15-Nov-1965  Phone: [phone] (home) , Alternate phone contact: N/A  Shipping address: [address]  [address]  [place] [address]  Phone or address changes today?: No  All above HIPAA information verified.  Insurance changes? No    Completed refill and clinical call assessment today to schedule patient's medication shipment from the [facility] [phone]).      MEDICATION RECONCILIATION    Confirmed the medication and dosage are correct and have not changed: Yes, regimen is correct and unchanged.    Were there any changes to your medication(s) in the past month:  No, there are no changes reported at this time.    ADHERENCE    Is this medicine transplant or covered by Medicare Part B? Yes.    Prograf 0.5 mg   Quantity filled last month: 90   # of tablets left on hand: 30      Myfortic 180 mg   Quantity filled last month: 180   # of tablets left on hand: 60      Did you miss any doses in the past 4 weeks? No missed doses reported.  Adherence counseling provided? Not needed     SIDE EFFECT MANAGEMENT    Are you tolerating your medication?:  Lynn Erickson reports tolerating the medication.  Side effect management discussed: None      Therapy is appropriate and should be continued.    Evidence of clinical benefit: See Epic note from 05/06/17      FINANCIAL/SHIPPING    Delivery Scheduled: Yes, Expected medication delivery date: 06/18/17   Additional medications refilled: No additional medications/refills needed at this time.    Searra did not have any additional questions at this time.    Delivery address validated in FSI scheduling system: Yes, address listed above is correct.      We will follow up with patient monthly for standard refill processing and delivery.      Thank you,  Mickle Mallory   [facility] Specialty Pharmacist    Update: patient called today 12/11 and states has no myfortic for tomorrow (though doesn't add up with status of this note from last week0 and wants to make sure ups delivers it early. I told her that we cannot choose a time ups delivers, but that she can download ups app and request early time. Med has already labeled and shipped from our facility, patient was given tracking number when she called earlier today as well. If patient wanted to buy a few tabs at outpatient pharmacy she could but would be costly. Advised patient to download app, request early delivery, and to send message to her coordinator to coordinate a late dose if this becomes necessary. I will also send message to coordinator myself. Patient voiced understanding.

## 2017-06-17 MED FILL — CARVEDILOL/25MG/TABS: CARVEDILOL/25MG/TABS | 30 days supply | Qty: 60 | Fill #2

## 2017-06-17 MED FILL — MYFORTIC/180MG/TAB: MYFORTIC/180MG/TAB | 30 days supply | Qty: 180 | Fill #2

## 2017-06-17 MED FILL — PROGRAF/0.5MG/CAP: PROGRAF/0.5MG/CAP | 30 days supply | Qty: 90 | Fill #0

## 2017-06-18 ENCOUNTER — Ambulatory Visit
Admission: RE | Admit: 2017-06-18 | Discharge: 2017-06-18 | Disposition: A | Discharge status: Home / Self Care | Payer: MEDICARE

## 2017-06-18 DIAGNOSIS — D899 Disorder involving the immune mechanism, unspecified: Principal | ICD-10-CM

## 2017-06-18 DIAGNOSIS — Z940 Kidney transplant status: Secondary | ICD-10-CM

## 2017-06-18 DIAGNOSIS — D638 Anemia in other chronic diseases classified elsewhere: Secondary | ICD-10-CM | POA: Diagnosis present

## 2017-06-18 DIAGNOSIS — M329 Systemic lupus erythematosus, unspecified: Secondary | ICD-10-CM | POA: Diagnosis not present

## 2017-06-18 DIAGNOSIS — I5189 Other ill-defined heart diseases: Secondary | ICD-10-CM | POA: Insufficient documentation

## 2017-06-18 DIAGNOSIS — Z8541 Personal history of malignant neoplasm of cervix uteri: Secondary | ICD-10-CM | POA: Diagnosis not present

## 2017-06-18 DIAGNOSIS — I519 Heart disease, unspecified: Secondary | ICD-10-CM | POA: Diagnosis not present

## 2017-06-18 DIAGNOSIS — M8589 Other specified disorders of bone density and structure, multiple sites: Secondary | ICD-10-CM | POA: Diagnosis not present

## 2017-06-18 DIAGNOSIS — Z13820 Encounter for screening for osteoporosis: Secondary | ICD-10-CM | POA: Diagnosis not present

## 2017-06-18 DIAGNOSIS — I10 Essential (primary) hypertension: Secondary | ICD-10-CM | POA: Diagnosis not present

## 2017-06-18 DIAGNOSIS — M818 Other osteoporosis without current pathological fracture: Secondary | ICD-10-CM | POA: Diagnosis not present

## 2017-06-18 DIAGNOSIS — K219 Gastro-esophageal reflux disease without esophagitis: Secondary | ICD-10-CM | POA: Diagnosis not present

## 2017-06-18 LAB — CBC W/ AUTO DIFF
HEMATOCRIT: 35.5 % — ABNORMAL LOW (ref 36.0–46.0)
HEMOGLOBIN: 10.8 g/dL — ABNORMAL LOW (ref 13.5–16.0)
LARGE UNSTAINED CELLS: 4 % (ref 0–4)
LYMPHOCYTES ABSOLUTE COUNT: 0.8 10*9/L — ABNORMAL LOW (ref 1.5–5.0)
MEAN CORPUSCULAR HEMOGLOBIN CONC: 30.5 g/dL — ABNORMAL LOW (ref 31.0–37.0)
MEAN CORPUSCULAR HEMOGLOBIN: 19.9 pg — ABNORMAL LOW (ref 26.0–34.0)
MEAN CORPUSCULAR VOLUME: 65.4 fL — ABNORMAL LOW (ref 80.0–100.0)
MEAN PLATELET VOLUME: 9.1 fL (ref 7.0–10.0)
MONOCYTES ABSOLUTE COUNT: 0.3 10*9/L (ref 0.2–0.8)
NEUTROPHILS ABSOLUTE COUNT: 2.1 10*9/L (ref 2.0–7.5)
PLATELET COUNT: 173 10*9/L (ref 150–440)
RED BLOOD CELL COUNT: 5.42 10*12/L — ABNORMAL HIGH (ref 4.00–5.20)
RED CELL DISTRIBUTION WIDTH: 16.6 % — ABNORMAL HIGH (ref 12.0–15.0)
WBC ADJUSTED: 3.5 10*9/L — ABNORMAL LOW (ref 4.5–11.0)

## 2017-06-18 LAB — TACROLIMUS BLOOD: Lab: 9.8

## 2017-06-18 LAB — BASIC METABOLIC PANEL
ANION GAP: 9 mmol/L (ref 9–15)
BUN / CREAT RATIO: 24
CALCIUM: 11.3 mg/dL — ABNORMAL HIGH (ref 8.5–10.2)
CHLORIDE: 112 mmol/L — ABNORMAL HIGH (ref 98–107)
CO2: 22 mmol/L (ref 22.0–30.0)
EGFR MDRD AF AMER: >=60 mL/min/{1.73_m2} (ref >=60)
EGFR MDRD NON AF AMER: >=60 mL/min/{1.73_m2} (ref >=60)
GLUCOSE RANDOM: 95 mg/dL (ref 65–179)
POTASSIUM: 4.7 mmol/L (ref 3.5–5.0)
SODIUM: 143 mmol/L (ref 135–145)

## 2017-06-18 LAB — MAGNESIUM: Magnesium:MCnc:Pt:Ser/Plas:Qn:: 1.4 — ABNORMAL LOW

## 2017-06-18 LAB — PHOSPHORUS: Phosphate:MCnc:Pt:Ser/Plas:Qn:: 3.1

## 2017-06-18 LAB — SLIDE REVIEW

## 2017-06-18 LAB — RED BLOOD CELL COUNT: Lab: 5.42 — ABNORMAL HIGH

## 2017-06-18 LAB — OVALOCYTES

## 2017-06-18 LAB — EGFR MDRD NON AF AMER: Glomerular filtration rate/1.73 sq M.predicted.non black:ArVRat:Pt:Ser/Plas/Bld:Qn:Creatinine-based formula (MDRD): >=60

## 2017-06-18 NOTE — Unmapped (Signed)
Pt did not receive her myfortic medication in time to take her AM dose. Pt advised to resume her myfortic Medication PM schedule

## 2017-06-18 NOTE — Unmapped (Signed)
Pt called to talk about her Rx mail order. She said her mail order should be to her in the morning and she doesn't have a myfortic dose for the morning. She said she has gotten her mail order between 8am and 11am. I told her to take all her regular medication at usual time, if she gets her myfortic by 11 she should take her morning dose. If noon or 1 pm comes around and her meds are not there she needs to contact her coordinator for plan to get meds for the evening. Pt agreed with plan.

## 2017-06-19 MED ORDER — MYFORTIC 180 MG TABLET,DELAYED RELEASE: 540 mg | tablet | Freq: Two times a day (BID) | 11 refills | 0 days | Status: AC

## 2017-06-19 MED ORDER — MYFORTIC 180 MG TABLET,DELAYED RELEASE: tablet | 11 refills | 0 days

## 2017-06-19 MED ORDER — MYFORTIC 180 MG TABLET,DELAYED RELEASE
ORAL_TABLET | Freq: Two times a day (BID) | ORAL | 11 refills | 0.00000 days | Status: CP
Start: 2017-06-19 — End: 2017-06-19

## 2017-06-19 NOTE — Unmapped (Signed)
Pt called stating her myfortic had not been received, TNC used her tracking # to track the package and it was delayed by the weather, made pt aware that if she has not received it tomorrow then to call TNC and we could see what we could do, pt stated she is willing to come to [facility] to get medications. Note routed to primary TNC

## 2017-06-23 ENCOUNTER — Ambulatory Visit
Admission: RE | Admit: 2017-06-23 | Discharge: 2017-06-23 | Disposition: A | Discharge status: Home / Self Care | Payer: MEDICARE

## 2017-06-23 DIAGNOSIS — Z940 Kidney transplant status: Secondary | ICD-10-CM

## 2017-06-23 DIAGNOSIS — R6889 Other general symptoms and signs: Secondary | ICD-10-CM

## 2017-06-23 DIAGNOSIS — D899 Disorder involving the immune mechanism, unspecified: Principal | ICD-10-CM

## 2017-06-23 LAB — BASIC METABOLIC PANEL
ANION GAP: 11 mmol/L (ref 9–15)
BLOOD UREA NITROGEN: 18 mg/dL (ref 7–21)
BUN / CREAT RATIO: 24
CALCIUM: 11.6 mg/dL — ABNORMAL HIGH (ref 8.5–10.2)
CHLORIDE: 109 mmol/L — ABNORMAL HIGH (ref 98–107)
CO2: 22 mmol/L (ref 22.0–30.0)
CREATININE: 0.75 mg/dL (ref 0.60–1.00)
EGFR MDRD AF AMER: >=60 mL/min/{1.73_m2} (ref >=60)
GLUCOSE RANDOM: 91 mg/dL (ref 65–179)
SODIUM: 142 mmol/L (ref 135–145)

## 2017-06-23 LAB — CBC W/ AUTO DIFF
BASOPHILS ABSOLUTE COUNT: 0 10*9/L (ref 0.0–0.1)
EOSINOPHILS ABSOLUTE COUNT: 0.2 10*9/L (ref 0.0–0.4)
HEMATOCRIT: 36 % (ref 36.0–46.0)
HEMOGLOBIN: 10.7 g/dL — ABNORMAL LOW (ref 13.5–16.0)
LARGE UNSTAINED CELLS: 3 % (ref 0–4)
LYMPHOCYTES ABSOLUTE COUNT: 0.8 10*9/L — ABNORMAL LOW (ref 1.5–5.0)
MEAN CORPUSCULAR HEMOGLOBIN CONC: 29.8 g/dL — ABNORMAL LOW (ref 31.0–37.0)
MEAN CORPUSCULAR HEMOGLOBIN: 19.4 pg — ABNORMAL LOW (ref 26.0–34.0)
MEAN CORPUSCULAR VOLUME: 65.1 fL — ABNORMAL LOW (ref 80.0–100.0)
MONOCYTES ABSOLUTE COUNT: 0.4 10*9/L (ref 0.2–0.8)
NEUTROPHILS ABSOLUTE COUNT: 4.2 10*9/L (ref 2.0–7.5)
PLATELET COUNT: 176 10*9/L (ref 150–440)
RED CELL DISTRIBUTION WIDTH: 16.8 % — ABNORMAL HIGH (ref 12.0–15.0)
WBC ADJUSTED: 5.7 10*9/L (ref 4.5–11.0)

## 2017-06-23 LAB — URINALYSIS
GLUCOSE UA: NEGATIVE
KETONES UA: NEGATIVE
NITRITE UA: NEGATIVE
PH UA: 7 (ref 5.0–9.0)
PROTEIN UA: NEGATIVE
RBC UA: <1 /HPF (ref <4)
SPECIFIC GRAVITY UA: 1.004 — ABNORMAL LOW (ref 1.005–1.040)
SQUAMOUS EPITHELIAL: <1 /HPF (ref 0–5)
UROBILINOGEN UA: 0.2
WBC UA: 25 /HPF — ABNORMAL HIGH (ref 0–5)

## 2017-06-23 LAB — GLUCOSE RANDOM: Glucose:MCnc:Pt:Ser/Plas:Qn:: 91

## 2017-06-23 LAB — MAGNESIUM: Magnesium:MCnc:Pt:Ser/Plas:Qn:: 1.4 — ABNORMAL LOW

## 2017-06-23 LAB — PHOSPHORUS: Phosphate:MCnc:Pt:Ser/Plas:Qn:: 3.2

## 2017-06-23 LAB — HEMOGLOBIN: Lab: 10.7 — ABNORMAL LOW

## 2017-06-23 LAB — UROBILINOGEN UA: Lab: 0.2

## 2017-06-23 MED ORDER — SULFAMETHOXAZOLE 800 MG-TRIMETHOPRIM 160 MG TABLET
ORAL_TABLET | Freq: Two times a day (BID) | ORAL | 0 refills | 0 days | Status: CP
Start: 2017-06-23 — End: 2017-06-30

## 2017-06-23 NOTE — Unmapped (Addendum)
Pt paged on call TNC stating  I think I have a urine infection. Pt reported that late Friday night it started when she had pain with urination.  She drank lots of water all day Saturday, but she experienced pain/burning with urination at night. Today her urine is yellow/orange with light tint of red; however, she said it's really hard to tell because it looks orange and becoming more yellow now  in color and it hurts a little bit when she urinates. Pt said I feel good and I do not have a fever. Plan for patient to go to [facility] in the morning to leave urine and obtain labs. Instructed patient to come to [place] [facility] if she develops a fever or has bloody urine overnight.  Pt agreed. Will forward message to primary TNC.

## 2017-06-23 NOTE — Unmapped (Signed)
Pt advised to start bactrim DS BID for 7 days and repeat UA/UC post treatment.

## 2017-06-24 LAB — TACROLIMUS BLOOD: Lab: 7.9

## 2017-06-27 DIAGNOSIS — M8588 Other specified disorders of bone density and structure, other site: Secondary | ICD-10-CM | POA: Diagnosis not present

## 2017-06-27 DIAGNOSIS — M818 Other osteoporosis without current pathological fracture: Secondary | ICD-10-CM | POA: Diagnosis not present

## 2017-07-03 ENCOUNTER — Ambulatory Visit
Admission: RE | Admit: 2017-07-03 | Discharge: 2017-07-03 | Disposition: A | Discharge status: Home / Self Care | Payer: MEDICARE

## 2017-07-03 DIAGNOSIS — Z940 Kidney transplant status: Secondary | ICD-10-CM

## 2017-07-03 DIAGNOSIS — D899 Disorder involving the immune mechanism, unspecified: Principal | ICD-10-CM

## 2017-07-03 LAB — SLIDE REVIEW

## 2017-07-03 LAB — BASIC METABOLIC PANEL
ANION GAP: 7 mmol/L — ABNORMAL LOW (ref 9–15)
BLOOD UREA NITROGEN: 24 mg/dL — ABNORMAL HIGH (ref 7–21)
BUN / CREAT RATIO: 32
CALCIUM: 11.5 mg/dL — ABNORMAL HIGH (ref 8.5–10.2)
CHLORIDE: 115 mmol/L — ABNORMAL HIGH (ref 98–107)
CO2: 19 mmol/L — ABNORMAL LOW (ref 22.0–30.0)
CREATININE: 0.74 mg/dL (ref 0.60–1.00)
EGFR MDRD AF AMER: >=60 mL/min/{1.73_m2} (ref >=60)
EGFR MDRD NON AF AMER: >=60 mL/min/{1.73_m2} (ref >=60)
GLUCOSE RANDOM: 98 mg/dL (ref 65–179)
POTASSIUM: 5.1 mmol/L — ABNORMAL HIGH (ref 3.5–5.0)

## 2017-07-03 LAB — CBC W/ AUTO DIFF
BASOPHILS ABSOLUTE COUNT: 0 10*9/L (ref 0.0–0.1)
HEMATOCRIT: 34.2 % — ABNORMAL LOW (ref 36.0–46.0)
HEMOGLOBIN: 10.4 g/dL — ABNORMAL LOW (ref 13.5–16.0)
LARGE UNSTAINED CELLS: 3 % (ref 0–4)
LYMPHOCYTES ABSOLUTE COUNT: 0.9 10*9/L — ABNORMAL LOW (ref 1.5–5.0)
MEAN CORPUSCULAR HEMOGLOBIN CONC: 30.4 g/dL — ABNORMAL LOW (ref 31.0–37.0)
MEAN CORPUSCULAR HEMOGLOBIN: 19.6 pg — ABNORMAL LOW (ref 26.0–34.0)
MEAN CORPUSCULAR VOLUME: 64.5 fL — ABNORMAL LOW (ref 80.0–100.0)
MEAN PLATELET VOLUME: 7.7 fL (ref 7.0–10.0)
MONOCYTES ABSOLUTE COUNT: 0.3 10*9/L (ref 0.2–0.8)
NEUTROPHILS ABSOLUTE COUNT: 2.6 10*9/L (ref 2.0–7.5)
PLATELET COUNT: 185 10*9/L (ref 150–440)
RED BLOOD CELL COUNT: 5.31 10*12/L — ABNORMAL HIGH (ref 4.00–5.20)
RED CELL DISTRIBUTION WIDTH: 16.2 % — ABNORMAL HIGH (ref 12.0–15.0)
WBC ADJUSTED: 4.3 10*9/L — ABNORMAL LOW (ref 4.5–11.0)

## 2017-07-03 LAB — MEAN CORPUSCULAR HEMOGLOBIN: Lab: 19.6 — ABNORMAL LOW

## 2017-07-03 LAB — CREATININE: Creatinine:MCnc:Pt:Ser/Plas:Qn:: 0.74

## 2017-07-03 LAB — TACROLIMUS BLOOD: Lab: 5.8

## 2017-07-03 LAB — PHOSPHORUS: Phosphate:MCnc:Pt:Ser/Plas:Qn:: 3.2

## 2017-07-03 LAB — POLYCHROMASIA

## 2017-07-03 LAB — MAGNESIUM: Magnesium:MCnc:Pt:Ser/Plas:Qn:: 1.3 — ABNORMAL LOW

## 2017-07-03 MED ORDER — CINACALCET 30 MG TABLET
ORAL_TABLET | Freq: Every day | ORAL | 11 refills | 0 days | Status: CP
Start: 2017-07-03 — End: 2017-07-16

## 2017-07-03 NOTE — Unmapped (Signed)
Pt advised to stop vitamin D and start Sensipar 30 mg daily.

## 2017-07-04 ENCOUNTER — Ambulatory Visit
Admission: RE | Admit: 2017-07-04 | Discharge: 2017-07-04 | Disposition: A | Discharge status: Home / Self Care | Payer: MEDICARE

## 2017-07-04 DIAGNOSIS — Z0000 Encounter for general adult medical examination without abnormal findings: Secondary | ICD-10-CM

## 2017-07-04 DIAGNOSIS — D899 Disorder involving the immune mechanism, unspecified: Principal | ICD-10-CM

## 2017-07-04 DIAGNOSIS — Z940 Kidney transplant status: Secondary | ICD-10-CM

## 2017-07-04 LAB — URINALYSIS
BILIRUBIN UA: NEGATIVE
BLOOD UA: NEGATIVE
GLUCOSE UA: NEGATIVE
LEUKOCYTE ESTERASE UA: NEGATIVE
NITRITE UA: NEGATIVE
PH UA: 5.5 (ref 5.0–9.0)
RBC UA: <1 /HPF (ref <4)
SPECIFIC GRAVITY UA: 1.007 (ref 1.005–1.040)
SQUAMOUS EPITHELIAL: <1 /HPF (ref 0–5)
UROBILINOGEN UA: 0.2
WBC UA: 1 /HPF (ref 0–5)

## 2017-07-04 LAB — GLUCOSE UA: Lab: NEGATIVE

## 2017-07-04 NOTE — Unmapped (Signed)
Pt advised to stop vitamin d and start Sensipar 30 mg daily

## 2017-07-11 NOTE — Unmapped (Signed)
[facility] Specialty Pharmacy Refill Coordination Note  Specialty Medication(s): Myfortic 180mg    Additional Medications shipped: Carvedilol 25mg     Jodi Mourning, DOB: [birth year]  Phone: [phone] (home) , Alternate phone contact: N/A  Phone or address changes today?: No  All above HIPAA information was verified with patient.  Shipping Address: [address]  [address]  [place] [address]   Insurance changes? No    Completed refill call assessment today to schedule patient's medication shipment from the [facility] [phone]).      Confirmed the medication and dosage are correct and have not changed: Yes, regimen is correct and unchanged.    Confirmed patient started or stopped the following medications in the past month:  No, there are no changes reported at this time.    Are you tolerating your medication?:  Arihana reports tolerating the medication.    ADHERENCE    (Below is required for Medicare Part B or Transplant patients only - per drug):   How many tablets were dispensed last month:     Myfortic 180 mg   Quantity filled last month: 180   # of tablets left on hand: 8 days    Did you miss any doses in the past 4 weeks? No missed doses reported.    FINANCIAL/SHIPPING    Delivery Scheduled: Yes, Expected medication delivery date: 07/18/2017     Frida did not have any additional questions at this time.    Delivery address validated in FSI scheduling system: Yes, address listed in FSI is correct.    We will follow up with patient monthly for standard refill processing and delivery.      Thank you,  Tamala Fothergill   [facility] Shared [facility] Specialty Technician

## 2017-07-16 MED ORDER — CINACALCET 30 MG TABLET
ORAL_TABLET | Freq: Every day | ORAL | 11 refills | 0 days | Status: CP
Start: 2017-07-16 — End: 2017-07-16

## 2017-07-16 MED ORDER — SENSIPAR 30 MG TABLET
ORAL_TABLET | ORAL | 11 refills | 0 days | Status: CP
Start: 2017-07-16 — End: 2018-08-05

## 2017-07-16 MED FILL — CARVEDILOL/25MG/TABS: CARVEDILOL/25MG/TABS | 30 days supply | Qty: 60 | Fill #3

## 2017-07-17 MED ORDER — MYFORTIC 180 MG TABLET,DELAYED RELEASE
ORAL_TABLET | Freq: Two times a day (BID) | ORAL | 11 refills | 0.00000 days | Status: CP
Start: 2017-07-17 — End: 2018-04-16

## 2017-07-17 MED FILL — MYFORTIC/180MG/TAB: MYFORTIC/180MG/TAB | 30 days supply | Qty: 180 | Fill #0

## 2017-07-17 NOTE — Unmapped (Signed)
[facility] Specialty Pharmacy Refill and Clinical Coordination Note  Medication(s): MYFORTIC  PATIENT STATES HAS ENOUGH PROGRAF FOR 1 MONTH    Lynn Erickson, DOB: 01/29/1966  Phone: [phone] (home) , Alternate phone contact: N/A  Shipping address: [address]  [address]  [place] [address]  Phone or address changes today?: No  All above HIPAA information verified.  Insurance changes? No    Completed refill and clinical call assessment today to schedule patient's medication shipment from the [facility] [phone]).      MEDICATION RECONCILIATION    Confirmed the medication and dosage are correct and have not changed: Yes, regimen is correct and unchanged.    Were there any changes to your medication(s) in the past month:  No, there are no changes reported at this time.    ADHERENCE    Is this medicine transplant or covered by Medicare Part B? Yes.    Myfortic 180 mg   Quantity filled last month: 180   # of tablets left on hand: 2  Prograf 0.5 mg   Quantity filled last month: 90   # of tablets left on hand: 90 (not sending out today)          Did you miss any doses in the past 4 weeks? No missed doses reported.  Adherence counseling provided? Not needed     SIDE EFFECT MANAGEMENT    Are you tolerating your medication?:  Chaquana reports tolerating the medication.  Side effect management discussed: None      Therapy is appropriate and should be continued.    Evidence of clinical benefit: See Epic note from 05/06/17      FINANCIAL/SHIPPING    Delivery Scheduled: Yes, Expected medication delivery date: 07/18/17 for myfortic, not now for prograf   Additional medications refilled: No additional medications/refills needed at this time.    Daley did not have any additional questions at this time.    Delivery address validated in FSI scheduling system: Yes, address listed above is correct.      We will follow up with patient monthly for standard refill processing and delivery.      Thank you, Thad Ranger   [facility] Shared [facility] Specialty Pharmacist

## 2017-07-20 MED FILL — MYCOPHENOLIC ACID DR/180MG DR/TBEC: MYCOPHENOLIC ACID DR/180MG DR/TBEC | 1 days supply | Qty: 6 | Fill #0

## 2017-07-22 DIAGNOSIS — Z1211 Encounter for screening for malignant neoplasm of colon: Secondary | ICD-10-CM | POA: Diagnosis not present

## 2017-07-22 DIAGNOSIS — Z1231 Encounter for screening mammogram for malignant neoplasm of breast: Secondary | ICD-10-CM | POA: Diagnosis not present

## 2017-07-22 DIAGNOSIS — Z8541 Personal history of malignant neoplasm of cervix uteri: Secondary | ICD-10-CM | POA: Diagnosis not present

## 2017-07-22 DIAGNOSIS — Z87898 Personal history of other specified conditions: Secondary | ICD-10-CM | POA: Diagnosis not present

## 2017-07-22 DIAGNOSIS — R4189 Other symptoms and signs involving cognitive functions and awareness: Secondary | ICD-10-CM | POA: Diagnosis not present

## 2017-07-22 DIAGNOSIS — Z01419 Encounter for gynecological examination (general) (routine) without abnormal findings: Secondary | ICD-10-CM | POA: Diagnosis not present

## 2017-07-23 ENCOUNTER — Encounter: Admit: 2017-07-23 | Discharge: 2017-07-24 | Payer: MEDICAID

## 2017-07-23 DIAGNOSIS — R4189 Other symptoms and signs involving cognitive functions and awareness: Principal | ICD-10-CM

## 2017-07-23 DIAGNOSIS — Z940 Kidney transplant status: Secondary | ICD-10-CM | POA: Diagnosis not present

## 2017-07-23 DIAGNOSIS — D899 Disorder involving the immune mechanism, unspecified: Secondary | ICD-10-CM | POA: Diagnosis not present

## 2017-07-23 LAB — CBC W/ AUTO DIFF
EOSINOPHILS ABSOLUTE COUNT: 0.3 10*9/L (ref 0.0–0.4)
HEMATOCRIT: 33.9 % — ABNORMAL LOW (ref 36.0–46.0)
HEMOGLOBIN: 10.5 g/dL — ABNORMAL LOW (ref 13.5–16.0)
LYMPHOCYTES ABSOLUTE COUNT: 0.7 10*9/L — ABNORMAL LOW (ref 1.5–5.0)
MEAN CORPUSCULAR HEMOGLOBIN CONC: 31.1 g/dL (ref 31.0–37.0)
MEAN CORPUSCULAR HEMOGLOBIN: 19.8 pg — ABNORMAL LOW (ref 26.0–34.0)
MEAN CORPUSCULAR VOLUME: 63.8 fL — ABNORMAL LOW (ref 80.0–100.0)
MEAN PLATELET VOLUME: 8.2 fL (ref 7.0–10.0)
MONOCYTES ABSOLUTE COUNT: 0.3 10*9/L (ref 0.2–0.8)
NEUTROPHILS ABSOLUTE COUNT: 2.5 10*9/L (ref 2.0–7.5)
PLATELET COUNT: 158 10*9/L (ref 150–440)
RED BLOOD CELL COUNT: 5.31 10*12/L — ABNORMAL HIGH (ref 4.00–5.20)
RED CELL DISTRIBUTION WIDTH: 16.4 % — ABNORMAL HIGH (ref 12.0–15.0)
WBC ADJUSTED: 3.9 10*9/L — ABNORMAL LOW (ref 4.5–11.0)

## 2017-07-23 LAB — COMPREHENSIVE METABOLIC PANEL
ALBUMIN: 4.6 g/dL (ref 3.5–5.0)
ALKALINE PHOSPHATASE: 154 U/L — ABNORMAL HIGH (ref 38–126)
ALT (SGPT): 33 U/L (ref 15–48)
ANION GAP: 9 mmol/L (ref 9–15)
AST (SGOT): 30 U/L (ref 14–38)
BILIRUBIN TOTAL: 0.5 mg/dL (ref 0.0–1.2)
BLOOD UREA NITROGEN: 20 mg/dL (ref 7–21)
BUN / CREAT RATIO: 25
CALCIUM: 10.1 mg/dL (ref 8.5–10.2)
CHLORIDE: 112 mmol/L — ABNORMAL HIGH (ref 98–107)
CREATININE: 0.81 mg/dL (ref 0.60–1.00)
EGFR MDRD AF AMER: >=60 mL/min/{1.73_m2} (ref >=60)
EGFR MDRD NON AF AMER: >=60 mL/min/{1.73_m2} (ref >=60)
GLUCOSE RANDOM: 96 mg/dL (ref 65–179)
POTASSIUM: 4.6 mmol/L (ref 3.5–5.0)
SODIUM: 142 mmol/L (ref 135–145)

## 2017-07-23 LAB — VITAMIN B-12: Cobalamins:MCnc:Pt:Ser/Plas:Qn:: 865

## 2017-07-23 LAB — HEMOGLOBIN: Lab: 10.5 — ABNORMAL LOW

## 2017-07-23 LAB — SMEAR REVIEW

## 2017-07-23 LAB — VITAMIN D, TOTAL (25OH): Lab: 21.5

## 2017-07-23 LAB — CHLORIDE: Chloride:SCnc:Pt:Ser/Plas:Qn:: 112 — ABNORMAL HIGH

## 2017-07-23 LAB — PHOSPHORUS: Phosphate:MCnc:Pt:Ser/Plas:Qn:: 3.4

## 2017-07-23 LAB — MAGNESIUM: Magnesium:MCnc:Pt:Ser/Plas:Qn:: 1.2 — ABNORMAL LOW

## 2017-07-23 LAB — FOLATE: Folate:MCnc:Pt:Ser/Plas:Qn:: >20 — ABNORMAL HIGH

## 2017-07-24 DIAGNOSIS — Z113 Encounter for screening for infections with a predominantly sexual mode of transmission: Secondary | ICD-10-CM | POA: Diagnosis not present

## 2017-07-24 DIAGNOSIS — R5383 Other fatigue: Secondary | ICD-10-CM | POA: Diagnosis not present

## 2017-07-24 DIAGNOSIS — Z114 Encounter for screening for human immunodeficiency virus [HIV]: Secondary | ICD-10-CM | POA: Diagnosis not present

## 2017-07-24 LAB — TACROLIMUS BLOOD: Lab: 6.9

## 2017-07-24 LAB — SYPHILIS RPR SCREEN: Reagin Ab:PrThr:Pt:Ser:Ord:RPR: NONREACTIVE

## 2017-07-24 LAB — RPR: SYPHILIS RPR SCREEN: NONREACTIVE

## 2017-07-29 NOTE — Unmapped (Signed)
[facility] [place] transplant nephrology clinic visit    assessment and plan  1. s/p kidney transplant 10/12/16. baseline creatinine 0.6-0.8 mg/dl. no proteinuria. no donor specific hla ab detected. tacrolimus decr to 1mg /0.5mg  q.am/pm re: 12hr lvl 6-8 ng/ml. took prograf at 9 pm.  2. hypertension. blood pressure goal < 130/80 mmhg.   3. +/-congestive heart failure. +[place] transplant surgery referral for av fistula ligation. furosmide 20mg  prn.   4. secondary/tertiary hyperparathyroidism. hold vitamin d d/t hypercalcemia.  5. preventive medicine. influenza vaccination deferred by patient d/t upper respiratory tract symptoms; strongly recommended at next clinic appt. ppsv23 pneumococcal, recombinant zoster recommended. pcv13 pneumococcal '17. ob/gyn exam.     history of present illness    [name] is a 52 y.o. woman seen in follow up post kidney transplant 10/12/2016.    pt feels well. no complaints.+impr overall from last visit. +able to lay down flat, more ease of breathing. +cont mild lower ext edema. +left arm pain, with assoc neck pain and headaches 2/2 av fistula. following with transplant surgery today. +nausea when skipping breakfast or not eating often enough, w occ assoc headaches. +stabbing pain in temples 2x in past few months. no associated activities or modifying factors. no fevers chills or sweats. no chest pain palpitations or shortness of breath at rest. +dyspnea with mod exertion. +/-orthopnea. no lower ext edema. +bilateral foot pain with prolonged standing/walking. +constipation, chr since childhood. uses remedies to move bowels and then +diarrhea x1 day, then return to regular bms and then constipation again. appetite nl. +20lb weight gain/past 94m. no urine symptoms. +incr forgetfulness. forgetting own name, children's names, forgets places and where she is, forgets what she is doing quickly and frequently. typically remembers with prompting. started before transplant, but it has been getting worse. no numbness tingling in hands or feet. all other systems reviewed and negative x10 systems.    past medical hx:  1. 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 baseline creatinine 0.6-0.8 mg/dl.  2. systemic lupus erythematosus   3. hypertension  4. secondary/tertiary hyperparathyroidism    past surgical hx: left upper ext av graft x2 '07-08. bilateral tubal ligation. kidney transplant '18.  ??  allergies: penicillin, vancomycin, aspirin, shellfish, hydroxychloroquine.  ??  medications: tacrolimus 1mg  bid, mycophenolate sodium 540mg  bid, carvedilol 25mg  bid, vitamin d3 5000iu daily, mg oxide 400mg  daily, famotidine 20mg  prn.  ??  soc hx: married x4 children. no smoking hx     physical exam: t96.9 p76 bp112/64 wt65.3kg bmi33.47. wd/wn woman nl/appropriate affect and mood. nl sclera anicteric. mmm no thrush. neck supple no palpable ln. heart rrr nl s1s2 no m/r/g. lungs clear bilateral. abd soft nt/nd. no lower ext edema. msk no synovitis/tophi. left upper ext av fistula +bruit/thrill. skin no rash. neuro alert oriented non focal exam.    labs 07/23/17: wbc3.9 hgb10.5 hct33.9 plts158. na142 k4.6 cl112 bicarb21 bun20 cr0.8 glc96 ca10.1 mg1.2 phos3.4. tacrolimus lvl 6.9 ng/ml. urine protein not detected. urine cytology: no malignant/decoy cells.    scribe's attestation: Elwyn Lade, MD obtained and performed the history, physical exam and medical decision making elements that were entered into the chart.  Signed by [place] Ormond Foster, Scribe, on July 30, 2017 12:35 PM.    ----------------------------------------------------------------------------------------------------------------------  July 31, 2017 5:29 PM. documentation assistance provided by the scribe. i was present during the time the encounter was recorded. the information recorded by the scribe was done at my direction and has been reviewed and validated by me. ----------------------------------------------------------------------------------------------------------------------

## 2017-07-30 ENCOUNTER — Encounter: Admit: 2017-07-30 | Discharge: 2017-07-30 | Payer: MEDICAID | Attending: Nephrology | Primary: Nephrology

## 2017-07-30 ENCOUNTER — Encounter: Admit: 2017-07-30 | Discharge: 2017-07-30 | Payer: MEDICAID | Attending: Surgery | Primary: Surgery

## 2017-07-30 DIAGNOSIS — R5383 Other fatigue: Principal | ICD-10-CM

## 2017-07-30 DIAGNOSIS — Z940 Kidney transplant status: Principal | ICD-10-CM

## 2017-07-30 DIAGNOSIS — Z0000 Encounter for general adult medical examination without abnormal findings: Secondary | ICD-10-CM

## 2017-07-30 DIAGNOSIS — Z09 Encounter for follow-up examination after completed treatment for conditions other than malignant neoplasm: Secondary | ICD-10-CM | POA: Diagnosis not present

## 2017-07-30 DIAGNOSIS — M329 Systemic lupus erythematosus, unspecified: Secondary | ICD-10-CM | POA: Diagnosis not present

## 2017-07-30 DIAGNOSIS — M79671 Pain in right foot: Secondary | ICD-10-CM | POA: Diagnosis not present

## 2017-07-30 DIAGNOSIS — Z2801 Immunization not carried out because of acute illness of patient: Secondary | ICD-10-CM | POA: Diagnosis not present

## 2017-07-30 DIAGNOSIS — Z881 Allergy status to other antibiotic agents status: Secondary | ICD-10-CM | POA: Diagnosis not present

## 2017-07-30 DIAGNOSIS — R600 Localized edema: Secondary | ICD-10-CM | POA: Diagnosis not present

## 2017-07-30 DIAGNOSIS — E212 Other hyperparathyroidism: Secondary | ICD-10-CM | POA: Diagnosis not present

## 2017-07-30 DIAGNOSIS — Z880 Allergy status to penicillin: Secondary | ICD-10-CM | POA: Diagnosis not present

## 2017-07-30 DIAGNOSIS — Z886 Allergy status to analgesic agent status: Secondary | ICD-10-CM | POA: Diagnosis not present

## 2017-07-30 DIAGNOSIS — M79602 Pain in left arm: Secondary | ICD-10-CM | POA: Diagnosis not present

## 2017-07-30 DIAGNOSIS — Z888 Allergy status to other drugs, medicaments and biological substances status: Secondary | ICD-10-CM | POA: Diagnosis not present

## 2017-07-30 DIAGNOSIS — R197 Diarrhea, unspecified: Secondary | ICD-10-CM | POA: Diagnosis not present

## 2017-07-30 DIAGNOSIS — R51 Headache: Secondary | ICD-10-CM | POA: Diagnosis not present

## 2017-07-30 DIAGNOSIS — R0600 Dyspnea, unspecified: Secondary | ICD-10-CM | POA: Diagnosis not present

## 2017-07-30 DIAGNOSIS — Z91013 Allergy to seafood: Secondary | ICD-10-CM | POA: Diagnosis not present

## 2017-07-30 DIAGNOSIS — M79672 Pain in left foot: Secondary | ICD-10-CM | POA: Diagnosis not present

## 2017-07-30 DIAGNOSIS — M542 Cervicalgia: Secondary | ICD-10-CM | POA: Diagnosis not present

## 2017-07-30 DIAGNOSIS — I10 Essential (primary) hypertension: Secondary | ICD-10-CM | POA: Diagnosis not present

## 2017-07-30 LAB — URINALYSIS
BACTERIA: NONE SEEN /HPF
BILIRUBIN UA: NEGATIVE
BLOOD UA: NEGATIVE
GLUCOSE UA: NEGATIVE
KETONES UA: NEGATIVE
NITRITE UA: NEGATIVE
PH UA: 5.5 (ref 5.0–9.0)
RBC UA: <1 /HPF (ref <4)
SPECIFIC GRAVITY UA: 1.013 (ref 1.003–1.030)
SQUAMOUS EPITHELIAL: <1 /HPF (ref 0–5)
UROBILINOGEN UA: 0.2
WBC UA: 1 /HPF (ref 0–5)

## 2017-07-30 LAB — PROTEIN / CREATININE RATIO, URINE: PROTEIN URINE: <4 mg/dL

## 2017-07-30 LAB — PROTEIN URINE: Protein:MCnc:Pt:Urine:Qn:: <4

## 2017-07-30 LAB — COLOR

## 2017-07-30 NOTE — Unmapped (Signed)
Vascular access consult    Kidney transplant 1 year ago, successful.  Pain over old AVG, occasionally radiating to chest, but that aspect no longer occurs.  SOme concertn from her nephrologist for hi output herart failure.    Past Medical History:   Diagnosis Date   ??? End stage renal disease (CMS-HCC)     on dialysis since 2007   ??? Hypertension    ??? Kidney transplant 10/12/2016 10/13/2016   ??? Nephrolithiasis 1980s   ??? Systemic lupus erythematosus (CMS-HCC)        Past Surgical History:   Procedure Laterality Date   ??? AV FISTULA PLACEMENT, BRACHIOCEPHALIC Left    ??? PR TRANSPLANT,PREP CADAVER RENAL GRAFT N/A 10/12/2016    Procedure: [facility] STD PREP CAD DONR RENAL ALLOGFT PRIOR TO TRNSPLNT, INCL DISSEC/REM PERINEPH FAT, DIAPH/RTPER ATTAC;  Surgeon: Leona Carry, MD;  Location: MAIN OR [facility];  Service: Transplant   ??? PR TRANSPLANTATION OF KIDNEY Right 10/12/2016    Procedure: RENAL ALLOTRANSPLANTATION, IMPLANTATION OF GRAFT; WITHOUT RECIPIENT NEPHRECTOMY;  Surgeon: Leona Carry, MD;  Location: MAIN OR [facility];  Service: Transplant   ??? tubal ligation Bilateral        Current Outpatient Prescriptions:   ???  carvedilol (COREG) 25 MG tablet, Take 1 tablet (25 mg total) by mouth Two (2) times a day., Disp: 60 tablet, Rfl: 11  ???  cinacalcet (SENSIPAR) 30 MG tablet, Take 1 tablet (30 mg total) by mouth daily. 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  famotidine (PEPCID) 20 MG tablet, Take 1 tablet (20 mg total) by mouth two (2) times a day as needed for heartburn., Disp: 60 tablet, Rfl: 11  ???  magnesium oxide (MAG-OX) 400 mg tablet, Take 400 mg by mouth daily., Disp: , Rfl:   ???  MYFORTIC 180 mg EC tablet, Take 3 tablets (540 mg total) by mouth Two (2) times a day. Z94.0; Kidney transplant 10/12/2016, Disp: 180 tablet, Rfl: 11  ???  PROGRAF 0.5 mg capsule, take [x2] 0.5mg  tablets in a.m. and [x1] 0.5mg  tablet in p.m.; DAW1; transplant date 10/12/2016, Disp: 90 capsule, Rfl: 11 Allergies   Allergen Reactions   ??? Plaquenil [Hydroxychloroquine] Other (See Comments)     Toxic maculopathy   ??? Shellfish Containing Products Anaphylaxis   ??? Aspirin Rash   ??? Penicillins Rash     skin testing 12/13/2016 negative, oral challenging pending for 03/15/2017   ??? 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   CTAB  RRR  Left arm AVG with Jumpgraft  Revision     PLAn- Ligation AVG  Feb 8th planned and consent signed.

## 2017-07-31 ENCOUNTER — Ambulatory Visit: Admit: 2017-07-31 | Discharge: 2017-08-01 | Payer: MEDICAID

## 2017-07-31 DIAGNOSIS — D649 Anemia, unspecified: Secondary | ICD-10-CM

## 2017-07-31 DIAGNOSIS — Z940 Kidney transplant status: Principal | ICD-10-CM

## 2017-07-31 LAB — COMPREHENSIVE METABOLIC PANEL
ALBUMIN: 4.5 g/dL (ref 3.5–5.0)
ALKALINE PHOSPHATASE: 151 U/L — ABNORMAL HIGH (ref 38–126)
ANION GAP: 8 mmol/L — ABNORMAL LOW (ref 9–15)
AST (SGOT): 30 U/L (ref 14–38)
BILIRUBIN TOTAL: 0.5 mg/dL (ref 0.0–1.2)
BLOOD UREA NITROGEN: 23 mg/dL — ABNORMAL HIGH (ref 7–21)
BUN / CREAT RATIO: 29
CALCIUM: 10.9 mg/dL — ABNORMAL HIGH (ref 8.5–10.2)
CHLORIDE: 114 mmol/L — ABNORMAL HIGH (ref 98–107)
CO2: 20 mmol/L — ABNORMAL LOW (ref 22.0–30.0)
CREATININE: 0.79 mg/dL (ref 0.60–1.00)
EGFR MDRD AF AMER: >=60 mL/min/{1.73_m2} (ref >=60)
EGFR MDRD NON AF AMER: >=60 mL/min/{1.73_m2} (ref >=60)
GLUCOSE RANDOM: 96 mg/dL (ref 65–179)
POTASSIUM: 4.8 mmol/L (ref 3.5–5.0)
PROTEIN TOTAL: 7.2 g/dL (ref 6.5–8.3)
SODIUM: 142 mmol/L (ref 135–145)

## 2017-07-31 LAB — CBC W/ AUTO DIFF
BASOPHILS ABSOLUTE COUNT: 0 10*9/L (ref 0.0–0.1)
EOSINOPHILS ABSOLUTE COUNT: 0.2 10*9/L (ref 0.0–0.4)
HEMATOCRIT: 34 % — ABNORMAL LOW (ref 36.0–46.0)
HEMOGLOBIN: 10.4 g/dL — ABNORMAL LOW (ref 13.5–16.0)
LARGE UNSTAINED CELLS: 4 % (ref 0–4)
LYMPHOCYTES ABSOLUTE COUNT: 0.8 10*9/L — ABNORMAL LOW (ref 1.5–5.0)
MEAN CORPUSCULAR HEMOGLOBIN: 19.9 pg — ABNORMAL LOW (ref 26.0–34.0)
MEAN CORPUSCULAR VOLUME: 65 fL — ABNORMAL LOW (ref 80.0–100.0)
MEAN PLATELET VOLUME: 7.6 fL (ref 7.0–10.0)
MONOCYTES ABSOLUTE COUNT: 0.3 10*9/L (ref 0.2–0.8)
NEUTROPHILS ABSOLUTE COUNT: 2.3 10*9/L (ref 2.0–7.5)
PLATELET COUNT: 163 10*9/L (ref 150–440)
RED BLOOD CELL COUNT: 5.23 10*12/L — ABNORMAL HIGH (ref 4.00–5.20)
RED CELL DISTRIBUTION WIDTH: 16.5 % — ABNORMAL HIGH (ref 12.0–15.0)
WBC ADJUSTED: 3.8 10*9/L — ABNORMAL LOW (ref 4.5–11.0)

## 2017-07-31 LAB — PARATHYROID HOMONE (PTH): PARATHYROID HORMONE INTACT: 215.2 pg/mL — ABNORMAL HIGH (ref 12.0–72.0)

## 2017-07-31 LAB — LIPID PANEL
CHOLESTEROL: 191 mg/dL (ref 100–199)
HDL CHOLESTEROL: 54 mg/dL (ref 40–59)
NON-HDL CHOLESTEROL: 137 mg/dL
TRIGLYCERIDES: 106 mg/dL (ref 1–149)

## 2017-07-31 LAB — PARATHYROID HORMONE INTACT: Parathyrin.intact:MCnc:Pt:Ser/Plas:Qn:: 215.2 — ABNORMAL HIGH

## 2017-07-31 LAB — CHOLESTEROL/HDL RATIO SCREEN: Lab: 3.5

## 2017-07-31 LAB — VITAMIN D, TOTAL (25OH): Lab: 28.4

## 2017-07-31 LAB — PHOSPHORUS: Phosphate:MCnc:Pt:Ser/Plas:Qn:: 3.2

## 2017-07-31 LAB — MICROCYTES

## 2017-07-31 LAB — TACROLIMUS, TROUGH: Lab: 5.9

## 2017-07-31 LAB — MAGNESIUM: Magnesium:MCnc:Pt:Ser/Plas:Qn:: 1.5 — ABNORMAL LOW

## 2017-07-31 LAB — GLUCOSE RANDOM: Glucose:MCnc:Pt:Ser/Plas:Qn:: 96

## 2017-08-01 LAB — IRON & TIBC
IRON SATURATION (CALC): 58 % — ABNORMAL HIGH (ref 15–50)
TOTAL IRON BINDING CAPACITY (CALC): 256.8 mg/dL (ref 252.0–479.0)

## 2017-08-01 LAB — CMV DNA, QUANTITATIVE, PCR: CMV VIRAL LD: NOT DETECTED

## 2017-08-01 LAB — TRANSFERRIN: Transferrin:MCnc:Pt:Ser/Plas:Qn:: 203.8

## 2017-08-01 LAB — CMV VIRAL LD: Lab: NOT DETECTED

## 2017-08-01 NOTE — Unmapped (Signed)
Spoke with patient today regarding new rx for sensipar. She states she does not want to use mail order and wants to pick up all meds at [facility] outpatient pharmacy. I advised her that as long as insurance would cover meds there (she has private insurance), that was her choice but she would have to request her refills each month. Patient agreed and wishes to be discontinued for specialty calls at this time. I am disenrolling patient but she is aware to call us if ever needs our services in future. Patient requests all future rxs be sent to [facility] instead of [facility].

## 2017-08-01 NOTE — Unmapped (Signed)
[facility] Specialty Medication Referral: No PA required    Medication (Brand/Generic): SENSIPAR    Initial FSI Test Claim completed with resulted information below:  No PA required  Patient ABLE to fill at [facility]  Insurance Company: [employer] MED D  Anticipated Copay: $3.80    As Co-pay is under $100 defined limit, per policy there will be no further investigation of need for financial assistance at this time unless patient requests. This referral has been communicated to the provider and handed off to the [facility] team for further processing and filling of prescribed medication.   ______________________________________________________________________  Please utilize this referral for viewing purposes as it will serve as the central location for all relevant documentation and updates.

## 2017-08-06 ENCOUNTER — Encounter: Admit: 2017-08-06 | Discharge: 2017-08-06 | Payer: MEDICAID

## 2017-08-06 DIAGNOSIS — Z1231 Encounter for screening mammogram for malignant neoplasm of breast: Principal | ICD-10-CM

## 2017-08-08 ENCOUNTER — Encounter: Admit: 2017-08-08 | Discharge: 2017-08-09 | Payer: MEDICAID

## 2017-08-08 DIAGNOSIS — K219 Gastro-esophageal reflux disease without esophagitis: Secondary | ICD-10-CM

## 2017-08-08 DIAGNOSIS — Z01818 Encounter for other preprocedural examination: Principal | ICD-10-CM

## 2017-08-08 DIAGNOSIS — Z940 Kidney transplant status: Secondary | ICD-10-CM

## 2017-08-08 DIAGNOSIS — I10 Essential (primary) hypertension: Secondary | ICD-10-CM

## 2017-08-08 DIAGNOSIS — L930 Discoid lupus erythematosus: Secondary | ICD-10-CM

## 2017-08-13 DIAGNOSIS — R413 Other amnesia: Secondary | ICD-10-CM | POA: Diagnosis not present

## 2017-08-13 DIAGNOSIS — M5481 Occipital neuralgia: Secondary | ICD-10-CM | POA: Diagnosis not present

## 2017-08-14 DIAGNOSIS — T82848A Pain from vascular prosthetic devices, implants and grafts, initial encounter: Principal | ICD-10-CM

## 2017-08-14 MED FILL — MYFORTIC/180MG/TAB: MYFORTIC/180MG/TAB | 30 days supply | Qty: 180 | Fill #0

## 2017-08-14 MED FILL — CARVEDILOL/25MG/TABS: CARVEDILOL/25MG/TABS | 30 days supply | Qty: 60 | Fill #4

## 2017-08-14 NOTE — Unmapped (Signed)
[facility] Specialty Pharmacy Refill and Clinical Coordination Note  Medication(s): MYFORTIC  DOESN'T NEED [facility] AND SENSIPAR AT THIS TIME    Lynn Erickson, DOB: 07-Apr-1966  Phone: [phone] (home) , Alternate phone contact: N/A  Shipping address: [name] [name]  [address]  Phone or address changes today?: Yes UPDATED HERE AND IN FSI (ADDRESS)  All above HIPAA information verified.  Insurance changes? No    Completed refill and clinical call assessment today to schedule patient's medication shipment from the [facility] [phone]).      MEDICATION RECONCILIATION    Confirmed the medication and dosage are correct and have not changed: Yes, regimen is correct and unchanged.    Were there any changes to your medication(s) in the past month:  No, there are no changes reported at this time.    ADHERENCE    Is this medicine transplant or covered by Medicare Part B? No.    Myfortic 180 mg   Quantity filled last month: 180   # of tablets left on hand: 2 DAYS LEFT      Did you miss any doses in the past 4 weeks? No missed doses reported.  Adherence counseling provided? Not needed     SIDE EFFECT MANAGEMENT    Are you tolerating your medication?:  Lynn Erickson reports tolerating the medication.  Side effect management discussed: None      Therapy is appropriate and should be continued.    Evidence of clinical benefit: See Epic note from 07/30/17      FINANCIAL/SHIPPING    Delivery Scheduled: Yes, Expected medication delivery date: 08/15/17   Additional medications refilled: COREG    Lynn Erickson did not have any additional questions at this time.    Delivery address validated in FSI scheduling system: Yes, address listed above is correct.      We will follow up with patient monthly for standard refill processing and delivery.      Thank you,  Thad Ranger   [facility] Shared [facility] Specialty Pharmacist

## 2017-08-15 ENCOUNTER — Encounter: Admit: 2017-08-15 | Discharge: 2017-08-15 | Payer: MEDICAID | Attending: Anesthesiology | Primary: Anesthesiology

## 2017-08-15 ENCOUNTER — Encounter: Admit: 2017-08-15 | Discharge: 2017-08-15 | Payer: MEDICAID

## 2017-08-15 DIAGNOSIS — T82848A Pain from vascular prosthetic devices, implants and grafts, initial encounter: Principal | ICD-10-CM

## 2017-08-15 DIAGNOSIS — M79602 Pain in left arm: Secondary | ICD-10-CM | POA: Diagnosis not present

## 2017-08-15 DIAGNOSIS — K219 Gastro-esophageal reflux disease without esophagitis: Secondary | ICD-10-CM | POA: Diagnosis not present

## 2017-08-15 DIAGNOSIS — Z940 Kidney transplant status: Secondary | ICD-10-CM | POA: Diagnosis not present

## 2017-08-15 DIAGNOSIS — I10 Essential (primary) hypertension: Secondary | ICD-10-CM | POA: Diagnosis not present

## 2017-08-15 DIAGNOSIS — Z79899 Other long term (current) drug therapy: Secondary | ICD-10-CM | POA: Diagnosis not present

## 2017-08-15 DIAGNOSIS — Z880 Allergy status to penicillin: Secondary | ICD-10-CM | POA: Diagnosis not present

## 2017-08-15 DIAGNOSIS — Z888 Allergy status to other drugs, medicaments and biological substances status: Secondary | ICD-10-CM | POA: Diagnosis not present

## 2017-08-15 DIAGNOSIS — M329 Systemic lupus erythematosus, unspecified: Secondary | ICD-10-CM | POA: Diagnosis not present

## 2017-08-15 DIAGNOSIS — I770 Arteriovenous fistula, acquired: Secondary | ICD-10-CM | POA: Diagnosis not present

## 2017-08-15 DIAGNOSIS — T82898A Other specified complication of vascular prosthetic devices, implants and grafts, initial encounter: Secondary | ICD-10-CM | POA: Diagnosis not present

## 2017-08-15 MED ORDER — OXYCODONE 5 MG TABLET
ORAL_TABLET | Freq: Four times a day (QID) | ORAL | 0 refills | 0.00000 days | Status: CP | PRN
Start: 2017-08-15 — End: 2017-10-14

## 2017-08-15 MED ORDER — OXYCODONE 5 MG TABLET: 5 mg | tablet | Freq: Four times a day (QID) | 0 refills | 0 days | Status: AC

## 2017-08-15 MED FILL — OXYCODONE HCL/5MG/TABS: OXYCODONE HCL/5MG/TABS | 1 days supply | Qty: 5 | Fill #0

## 2017-08-15 NOTE — Unmapped (Signed)
Nerve Block Discharge Instructions    General Considerations  ? Avoid placing cold, hot, hard or sharp items on your numb body part.  Also be careful to not rest your extremity on hard or hot/cold surfaces.  ? If you received a cooling device after surgery it is okay to use it.  ? Be careful not to bang or bump the numb body part.  Try to keep it in a neutral, comfortable position.  ? Elevate the limb; this will help decrease swelling and help increase comfort when the block wears off.  ? You may regain some movement or feeling, or experience some breakthrough pain, when the initial strong numbing medicine wears off.  This is normal.  ? Take your prescribed pain medicine as soon as you start feeling any discomfort in the extremity.  ? You should take your prescribed pain medicine BEFORE you go to bed, even if you are not experiencing discomfort.  If you have any questions or issues regarding the nerve block please call the anesthesia acute pain resident on call at [phone].    Upper Extremity Nerve Blocks  ? Be aware of the position of your arm and protect it from compression or other injury.  ? Ensure that your entire arm is supported, including the wrist.  ? Do not let the wrist dangle over the sling.  ? There are some possible side effects that you may experience with your particular block.  These side effects will resolve as your block wears off.   - hoarse voice - slight redness of the eye   - sagging eyelid - some mild shortness of breath (especially if lying down)   - smaller pupil    Lower Extremity Nerve Blocks  ? Be aware of the position of your leg and protect it from compression or other injury.  ? Pad the knee, ankle and heel.  ? Do NOT bear any weight on the numb extremity until you completely recover the feeling and full muscle strength.  ? Always have assistance when getting up and moving about.    Follow up Questionnaire:  A member of the regional anesthesia team will be contacting you in a day or two for follow up on your block.  Listed below are the questions they will be asking you.  Please use this sheet to help you remember the time sequence of the following events.    1. What time did you start having pain as the block wore off? _______________  2. What time were you first able to move your fingers/toes? ________________  3. Are you still experiencing any numbness in your blocked extremity? ________

## 2017-08-15 NOTE — Unmapped (Signed)
Vascular access consult    Kidney transplant 1 year ago, successful.  Pain over old AVG, occasionally radiating to chest, but that aspect no longer occurs.  SOme concertn from her nephrologist for hi output herart failure.    Past Medical History:   Diagnosis Date   ??? End stage renal disease (CMS-HCC)     on dialysis since 2007   ??? Hypertension    ??? Kidney transplant 10/12/2016 10/13/2016   ??? Nephrolithiasis 1980s   ??? Systemic lupus erythematosus (CMS-HCC)        Past Surgical History:   Procedure Laterality Date   ??? AV FISTULA PLACEMENT, BRACHIOCEPHALIC Left    ??? PR TRANSPLANT,PREP CADAVER RENAL GRAFT N/A 10/12/2016    Procedure: [facility] STD PREP CAD DONR RENAL ALLOGFT PRIOR TO TRNSPLNT, INCL DISSEC/REM PERINEPH FAT, DIAPH/RTPER ATTAC;  Surgeon: Leona Carry, MD;  Location: MAIN OR [facility];  Service: Transplant   ??? PR TRANSPLANTATION OF KIDNEY Right 10/12/2016    Procedure: RENAL ALLOTRANSPLANTATION, IMPLANTATION OF GRAFT; WITHOUT RECIPIENT NEPHRECTOMY;  Surgeon: Leona Carry, MD;  Location: MAIN OR [facility];  Service: Transplant   ??? tubal ligation Bilateral        Current Outpatient Prescriptions:   ???  carvedilol (COREG) 25 MG tablet, Take 1 tablet (25 mg total) by mouth Two (2) times a day., Disp: 60 tablet, Rfl: 11  ???  cinacalcet (SENSIPAR) 30 MG tablet, Take 1 tablet (30 mg total) by mouth daily. 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  famotidine (PEPCID) 20 MG tablet, Take 1 tablet (20 mg total) by mouth two (2) times a day as needed for heartburn., Disp: 60 tablet, Rfl: 11  ???  magnesium oxide (MAG-OX) 400 mg tablet, Take 400 mg by mouth daily., Disp: , Rfl:   ???  MYFORTIC 180 mg EC tablet, Take 3 tablets (540 mg total) by mouth Two (2) times a day. Z94.0; Kidney transplant 10/12/2016, Disp: 180 tablet, Rfl: 11  ???  PROGRAF 0.5 mg capsule, take [x2] 0.5mg  tablets in a.m. and [x1] 0.5mg  tablet in p.m.; DAW1; transplant date 10/12/2016, Disp: 90 capsule, Rfl: 11 Allergies   Allergen Reactions   ??? Plaquenil [Hydroxychloroquine] Other (See Comments)     Toxic maculopathy   ??? Shellfish Containing Products Anaphylaxis   ??? Aspirin Rash   ??? Penicillins Rash     skin testing 12/13/2016 negative, oral challenging pending for 03/15/2017   ??? 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   CTAB  RRR  Left arm AVG with Jumpgraft  Revision     PLAn- Ligation AVG  Feb 8th planned and consent signed.

## 2017-08-15 NOTE — Unmapped (Signed)
LIGATION OR BANDING OF ANGIOACCESS ARTERIOVENOUS FISTULA  Operative Note (CSN: [id number])      Date of Surgery: 08/15/2017  Admit Date: 08/15/2017  Performing Service: Transplant  Surgeon(s) and Role:     * Leona Carry, MD - Primary     * Trish Mage, MD - Resident - Assisting      Op Note  Pre-op Diagnosis: arm pain of AVF site s/p successful renal transplant  Post-op Diagnosis: arm pain of AVF site s/p successful renal transplant    Left - LIGATION OR BANDING OF ANGIOACCESS ARTERIOVENOUS FISTULA    Findings: AVF jump graft ligated and transected. Confirmed no flow proximally in fistula and maintained arterial flow distally in the brachial and radial artery after ligation.     Anesthesia: Regional  Estimated Blood Loss: 5 mL    Complications: * No complications entered in OR log *  Specimens: * No specimens in log *    Brief history / Indications for Surgery: Patient experiencing pain over AVF site and no longer needed fistula for dialysis.     Procedure Details   The patient was appropriately identified in the pre-operative holding. She received regional anesthesia in the holding area. She was then transferred to the OR where he was placed on the table in supine position.  A pre-induction time out was performed and anesthesia was induced. The left arm was clipped, prepped and draped in the usual sterile fashion. A second time out was performed, confirming administration of heparin and clindamycin prior to incision.     A 5 cm incision was made with the 15 blade along the prior incision. Using a combination of blunt and sharp dissection, the most distal portion of the jump graft was freed. Proximal control was achieved with 0 silk ties and the graft was ligated near the orignal av fistula anastamosis. Distal flow was verified through the artery in the antecubital fossa and at the radial artery. Adequate hemostasis was achieved. The wound was irrigated and closed with 3-0 vicryl dermal sutures and a 4-0 prolene running suture and skin glue.    Instrument, sponge, and needle counts were correct at the conclusion of the case.     Surgeon Notes: I was present and scrubbed for the entire procedure     Trish Mage   Date: 08/15/2017  Time: 10:32 AM

## 2017-08-15 NOTE — Unmapped (Signed)
No need for follow up appointment unless there are concerns

## 2017-08-19 NOTE — Unmapped (Signed)
Addendum  created 08/19/17 1424 by Tomi Bamberger, MD    Anesthesia Event edited, Sign clinical note

## 2017-08-28 ENCOUNTER — Encounter: Admit: 2017-08-28 | Discharge: 2017-08-29 | Payer: MEDICAID

## 2017-08-28 DIAGNOSIS — R5383 Other fatigue: Secondary | ICD-10-CM

## 2017-08-28 DIAGNOSIS — Z940 Kidney transplant status: Principal | ICD-10-CM

## 2017-08-28 LAB — BASIC METABOLIC PANEL
BLOOD UREA NITROGEN: 20 mg/dL (ref 7–21)
BUN / CREAT RATIO: 29
CALCIUM: 11.5 mg/dL — ABNORMAL HIGH (ref 8.5–10.2)
CHLORIDE: 110 mmol/L — ABNORMAL HIGH (ref 98–107)
CO2: 23 mmol/L (ref 22.0–30.0)
CREATININE: 0.69 mg/dL (ref 0.60–1.00)
EGFR MDRD AF AMER: >=60 mL/min/{1.73_m2} (ref >=60)
EGFR MDRD NON AF AMER: >=60 mL/min/{1.73_m2} (ref >=60)
GLUCOSE RANDOM: 95 mg/dL (ref 65–179)
POTASSIUM: 4.8 mmol/L (ref 3.5–5.0)
SODIUM: 142 mmol/L (ref 135–145)

## 2017-08-28 LAB — EGFR MDRD NON AF AMER: Glomerular filtration rate/1.73 sq M.predicted.non black:ArVRat:Pt:Ser/Plas/Bld:Qn:Creatinine-based formula (MDRD): >=60

## 2017-08-28 LAB — THYROID STIMULATING HORMONE: Thyrotropin:ACnc:Pt:Ser/Plas:Qn:: 1.709

## 2017-08-28 NOTE — Unmapped (Signed)
Patient does not need a refill of specialty medication at this time. Moving specialty refill reminder call to appropriate date and removed call attempt data.  Spoke with patient & she states she still has 3 weeks worth of medication left, and does not need any refills at this time.

## 2017-09-15 NOTE — Unmapped (Signed)
[facility] Specialty Pharmacy Refill Coordination Note  Specialty Medication(s): Myfortic 180mg    Additional Medications shipped: Sensipar 30mg  & Carvediolol 25mg     Lynn Erickson, DOB: [birth year]  Phone: [phone] (home) , Alternate phone contact: N/A  Phone or address changes today?: No  All above HIPAA information was verified with patient.  Shipping Address: [address]  [address]  [place] [address]   Insurance changes? No    Completed refill call assessment today to schedule patient's medication shipment from the [facility] [phone]).      Confirmed the medication and dosage are correct and have not changed: Yes, regimen is correct and unchanged.    Confirmed patient started or stopped the following medications in the past month:  No, there are no changes reported at this time.    Are you tolerating your medication?:  Lynn Erickson reports tolerating the medication.    ADHERENCE    (Below is required for Medicare Part B or Transplant patients only - per drug):   How many tablets were dispensed last month:     Myfortic 180 mg   Quantity filled last month: 180   # of tablets left on hand: 7 DAYS    Did you miss any doses in the past 4 weeks? No missed doses reported.    FINANCIAL/SHIPPING    Delivery Scheduled: Yes, Expected medication delivery date: 09/19/2017     The patient will receive an FSI print out for each medication shipped and additional FDA Medication Guides as required.  Patient education from [place] or Robet Leu may also be included in the shipment    Lynn Erickson did not have any additional questions at this time.    Delivery address validated in FSI scheduling system: Yes, address listed in FSI is correct.    We will follow up with patient monthly for standard refill processing and delivery.      Thank you,  Lynn Erickson   [facility] Shared [facility] Specialty Technician

## 2017-09-16 ENCOUNTER — Encounter: Admit: 2017-09-16 | Discharge: 2017-09-17 | Payer: MEDICAID

## 2017-09-16 DIAGNOSIS — Z940 Kidney transplant status: Principal | ICD-10-CM

## 2017-09-16 LAB — BASIC METABOLIC PANEL
ANION GAP: 8 mmol/L — ABNORMAL LOW (ref 9–15)
BLOOD UREA NITROGEN: 23 mg/dL — ABNORMAL HIGH (ref 7–21)
CALCIUM: 10.5 mg/dL — ABNORMAL HIGH (ref 8.5–10.2)
CHLORIDE: 110 mmol/L — ABNORMAL HIGH (ref 98–107)
CO2: 23 mmol/L (ref 22.0–30.0)
CREATININE: 0.7 mg/dL (ref 0.60–1.00)
EGFR MDRD AF AMER: >=60 mL/min/{1.73_m2} (ref >=60)
EGFR MDRD NON AF AMER: >=60 mL/min/{1.73_m2} (ref >=60)
GLUCOSE RANDOM: 97 mg/dL (ref 65–179)
POTASSIUM: 4.8 mmol/L (ref 3.5–5.0)

## 2017-09-16 LAB — CO2: Carbon dioxide:SCnc:Pt:Ser/Plas:Qn:: 23

## 2017-09-18 DIAGNOSIS — Z940 Kidney transplant status: Secondary | ICD-10-CM | POA: Diagnosis not present

## 2017-09-18 DIAGNOSIS — F0390 Unspecified dementia without behavioral disturbance: Secondary | ICD-10-CM | POA: Diagnosis not present

## 2017-09-18 DIAGNOSIS — K219 Gastro-esophageal reflux disease without esophagitis: Secondary | ICD-10-CM | POA: Diagnosis not present

## 2017-09-18 DIAGNOSIS — I10 Essential (primary) hypertension: Secondary | ICD-10-CM | POA: Diagnosis not present

## 2017-09-18 DIAGNOSIS — M329 Systemic lupus erythematosus, unspecified: Secondary | ICD-10-CM | POA: Diagnosis not present

## 2017-09-18 MED FILL — SENSIPAR/30MG/TABS: SENSIPAR/30MG/TABS | 30 days supply | Qty: 30 | Fill #0

## 2017-09-18 MED FILL — MYFORTIC/180MG/TAB: MYFORTIC/180MG/TAB | 30 days supply | Qty: 180 | Fill #1

## 2017-09-18 MED FILL — CARVEDILOL/25MG/TABS: CARVEDILOL/25MG/TABS | 30 days supply | Qty: 60 | Fill #5

## 2017-10-07 NOTE — Unmapped (Signed)
[facility] Specialty Pharmacy Refill Coordination Note    Medication(s) to be Shipped:   MYFORTIC 180MG  TABLETS,PROGRAF 0.5MG  CAPS  Other medications to be shipped: MAGOXIDE 400MG ,SENSIPAR,COREG     Jodi Mourning, DOB: [birth year]  Phone: [phone] (home)   Shipping Address: [address]  [address]  [place] [address]    All above HIPAA information was verified with patient.     Completed refill call assessment today to schedule patient's medication shipment from the [facility] [phone]).       Specialty medication(s) and dose(s) confirmed: Regimen is correct and unchanged.   Changes to medications: Latiffany reports no changes reported at this time.  Changes to insurance: No  Questions for the pharmacist: No    The patient will receive an FSI print out for each medication shipped and additional FDA Medication Guides as required.  Patient education from [place] or Robet Leu may also be included in the shipment.    DISEASE-SPECIFIC INFORMATION        N/A    ADHERENCE              Is this medicine covered by Medicare Part B? Yes.    Prograf 0.5 mg   Quantity filled last month: 90 CAPSULES:PT TAKING 2 CAPSULES IN THE AM,1 CAPSULE IN THE PM  # of tablets left on hand: 21 CAPSULES    Myfortic 180 mg   Quantity filled last month: 180 TABLETS   # of tablets left on hand: 42 TABLETS:PT TAKING 3 TABLETS TWICE A DAY      SHIPPING     Shipping address confirmed in FSI.     Delivery Scheduled: Yes, Expected medication delivery date: Arvella Nigh [id number] via UPS or courier.     Antonietta Barcelona   [facility] Specialty

## 2017-10-09 MED FILL — MAGNESIUM OXIDE/400MG/TABS: MAGNESIUM OXIDE/400MG/TABS | 60 days supply | Qty: 120 | Fill #2

## 2017-10-10 ENCOUNTER — Encounter: Admit: 2017-10-10 | Discharge: 2017-10-11 | Payer: MEDICAID

## 2017-10-10 DIAGNOSIS — E119 Type 2 diabetes mellitus without complications: Secondary | ICD-10-CM

## 2017-10-10 DIAGNOSIS — Z940 Kidney transplant status: Principal | ICD-10-CM

## 2017-10-10 LAB — CBC W/ AUTO DIFF
BASOPHILS ABSOLUTE COUNT: 0 10*9/L (ref 0.0–0.1)
BASOPHILS RELATIVE PERCENT: 0.4 %
EOSINOPHILS ABSOLUTE COUNT: 0.2 10*9/L (ref 0.0–0.4)
EOSINOPHILS RELATIVE PERCENT: 4.5 %
HEMATOCRIT: 32.4 % — ABNORMAL LOW (ref 36.0–46.0)
LARGE UNSTAINED CELLS: 3 % (ref 0–4)
LYMPHOCYTES ABSOLUTE COUNT: 0.8 10*9/L — ABNORMAL LOW (ref 1.5–5.0)
LYMPHOCYTES RELATIVE PERCENT: 16.8 %
MEAN CORPUSCULAR HEMOGLOBIN CONC: 30 g/dL — ABNORMAL LOW (ref 31.0–37.0)
MEAN CORPUSCULAR VOLUME: 64.3 fL — ABNORMAL LOW (ref 80.0–100.0)
MEAN PLATELET VOLUME: 7.9 fL (ref 7.0–10.0)
MONOCYTES ABSOLUTE COUNT: 0.3 10*9/L (ref 0.2–0.8)
MONOCYTES RELATIVE PERCENT: 6.8 %
NEUTROPHILS ABSOLUTE COUNT: 3.2 10*9/L (ref 2.0–7.5)
PLATELET COUNT: 213 10*9/L (ref 150–440)
RED BLOOD CELL COUNT: 5.05 10*12/L (ref 4.00–5.20)
RED CELL DISTRIBUTION WIDTH: 16.7 % — ABNORMAL HIGH (ref 12.0–15.0)
WBC ADJUSTED: 4.7 10*9/L (ref 4.5–11.0)

## 2017-10-10 LAB — SLIDE REVIEW

## 2017-10-10 LAB — COMPREHENSIVE METABOLIC PANEL
ALBUMIN: 4.6 g/dL (ref 3.5–5.0)
ALKALINE PHOSPHATASE: 142 U/L — ABNORMAL HIGH (ref 38–126)
ALT (SGPT): 25 U/L (ref 15–48)
ANION GAP: 7 mmol/L — ABNORMAL LOW (ref 9–15)
AST (SGOT): 29 U/L (ref 14–38)
BLOOD UREA NITROGEN: 21 mg/dL (ref 7–21)
BUN / CREAT RATIO: 26
CALCIUM: 10.9 mg/dL — ABNORMAL HIGH (ref 8.5–10.2)
CO2: 22 mmol/L (ref 22.0–30.0)
CREATININE: 0.8 mg/dL (ref 0.60–1.00)
EGFR MDRD AF AMER: >=60 mL/min/{1.73_m2} (ref >=60)
EGFR MDRD NON AF AMER: >=60 mL/min/{1.73_m2} (ref >=60)
GLUCOSE RANDOM: 91 mg/dL (ref 65–179)
POTASSIUM: 5.1 mmol/L — ABNORMAL HIGH (ref 3.5–5.0)
PROTEIN TOTAL: 7 g/dL (ref 6.5–8.3)

## 2017-10-10 LAB — MAGNESIUM: Magnesium:MCnc:Pt:Ser/Plas:Qn:: 1.5 — ABNORMAL LOW

## 2017-10-10 LAB — SMEAR REVIEW

## 2017-10-10 LAB — RED BLOOD CELL COUNT: Lab: 5.05

## 2017-10-10 LAB — CMV DNA, QUANTITATIVE, PCR

## 2017-10-10 LAB — PHOSPHORUS: Phosphate:MCnc:Pt:Ser/Plas:Qn:: 3.7

## 2017-10-10 LAB — CMV QUANT LOG10: Lab: 0

## 2017-10-10 LAB — TACROLIMUS, TROUGH: Lab: 8.4

## 2017-10-10 LAB — GLUCOSE RANDOM: Glucose:MCnc:Pt:Ser/Plas:Qn:: 91

## 2017-10-11 LAB — LIPID PANEL
CHOLESTEROL: 189 mg/dL (ref 100–199)
HDL CHOLESTEROL: 53 mg/dL (ref 40–59)
LDL CHOLESTEROL CALCULATED: 115 mg/dL
NON-HDL CHOLESTEROL: 136 mg/dL
TRIGLYCERIDES: 107 mg/dL (ref 1–149)
VLDL CHOLESTEROL CAL: 21.4 mg/dL

## 2017-10-11 LAB — LDL CHOLESTEROL CALCULATED: Cholesterol.in LDL:MCnc:Pt:Ser/Plas:Qn:Calculated: 115

## 2017-10-13 NOTE — Unmapped (Signed)
[employer] transplant nephrology clinic visit    assessment and plan  1. s/p kidney transplant 10/12/16. baseline creatinine 0.6-0.8 mg/dl. no proteinuria. no donor specific hla ab detected. tacrolimus 1mg /0.5mg  q.am/pm. 12hr lvl 6-8 ng/ml. took prograf at 9 pm.  2. hypertension. blood pressure goal < 130/80 mmhg.   3. secondary/tertiary hyperparathyroidism. hold vitamin d d/t hypercalcemia.  4. +/- uti. urinalysis pending and will send for culture. scheduled for annual renal ultrasound today. refer to urogyn re: bladder emptying   5. preventive medicine. influenza vaccination deferred by patient d/t upper respiratory tract symptoms; strongly recommended next fall. ppsv23 pneumococcal, recombinant zoster records pending. pcv13 pneumococcal '17. ob/gyn exam '19. discuss shingrix vaccine at next visit.     history of present illness    [name] is a 52 y.o. woman seen in follow up post kidney transplant 10/12/2016. s/p left avf ligation february 2019. chest pain and sob resolved s/p ligation. +1-2 wk cough and chest tightness. started on z-pack by primary and symptoms improving. reports episode of abdominal pain last evening over transplant kidney. +bubbly urine and inc urgency with minimal voiding. denies dysuria, gross hematuria, fever. +hair loss. no chills or sweats. no chest pain palpitations or shortness of breath. no lower ext edema. +constipation, chr since childhood. uses remedies to move bowels and then +diarrhea x1 day, then return to regular bms and then constipation again. appetite nl. energy lvl improved. all other systems reviewed and negative x10 systems.    past medical hx:  1. 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 baseline creatinine 0.6-0.8 mg/dl.  2. systemic lupus erythematosus   3. hypertension  4. secondary/tertiary hyperparathyroidism    past surgical hx: left upper ext av graft x2 '07-08. bilateral tubal ligation. kidney transplant '18. avf ligation '19.  ??  allergies: penicillin, vancomycin, aspirin, shellfish, hydroxychloroquine.  ??  medications: tacrolimus 1mg  qam/ 0.5mg  qpm, mycophenolate sodium 540mg  bid, carvedilol 25mg  bid, sensipar 30 mg daily, mg oxide 400mg  daily, famotidine 20mg  prn. start today: biotin supplement.   ??  soc hx: married x4 children. no smoking hx     physical exam: BP 124/76 (BP Site: R Arm, BP Position: Sitting, BP Cuff Size: Medium)  - Pulse 72  - Temp 36.2 ??C (97.1 ??F) (Temporal)  - Wt 67.2 kg (148 lb 3.2 oz)  - BMI 34.44 kg/m??   wd/wn woman nl/appropriate affect and mood. nl sclera anicteric. mmm no thrush. neck supple no palpable ln. heart rrr nl s1s2 no m/r/g. lungs clear bilateral. abd soft nt/nd. no lower ext edema. msk no synovitis/tophi. left upper ext av fistula +bruit/thrill. skin no rash. neuro alert oriented non focal exam.    labs 10/10/17: wbc4.7 hgb9.7 hct32.4 plts213. na142 k5.1 cl113 bicarb22 bun21 cr0.8 glc91 ca10.9 mg1.5 phos3.7. urine protein not detected. urine cytology: no malignant/decoy cells.    scribe's attestation: Elwyn Lade, MD obtained and performed the history, physical exam and medical decision making elements that were entered into the chart.  Signed by Jerl Santos, Scribe, on October 14, 2017 at 12:02 PM.    ----------------------------------------------------------------------------------------------------------------------  October 14, 2017 3:23 PM. documentation assistance provided by the scribe. i was present during the time the encounter was recorded. the information recorded by the scribe was done at my direction and has been reviewed and validated by me.  ----------------------------------------------------------------------------------------------------------------------

## 2017-10-14 ENCOUNTER — Ambulatory Visit: Admit: 2017-10-14 | Discharge: 2017-10-14 | Payer: MEDICAID | Attending: Nephrology | Primary: Nephrology

## 2017-10-14 ENCOUNTER — Encounter: Admit: 2017-10-14 | Discharge: 2017-10-14 | Payer: MEDICAID

## 2017-10-14 DIAGNOSIS — Z940 Kidney transplant status: Principal | ICD-10-CM

## 2017-10-14 DIAGNOSIS — D899 Disorder involving the immune mechanism, unspecified: Secondary | ICD-10-CM

## 2017-10-14 DIAGNOSIS — Z0000 Encounter for general adult medical examination without abnormal findings: Secondary | ICD-10-CM | POA: Diagnosis not present

## 2017-10-14 DIAGNOSIS — N2581 Secondary hyperparathyroidism of renal origin: Secondary | ICD-10-CM | POA: Diagnosis not present

## 2017-10-14 DIAGNOSIS — Z881 Allergy status to other antibiotic agents status: Secondary | ICD-10-CM | POA: Diagnosis not present

## 2017-10-14 DIAGNOSIS — Z880 Allergy status to penicillin: Secondary | ICD-10-CM | POA: Diagnosis not present

## 2017-10-14 DIAGNOSIS — Z91013 Allergy to seafood: Secondary | ICD-10-CM | POA: Diagnosis not present

## 2017-10-14 DIAGNOSIS — I110 Hypertensive heart disease with heart failure: Secondary | ICD-10-CM | POA: Diagnosis not present

## 2017-10-14 DIAGNOSIS — I509 Heart failure, unspecified: Secondary | ICD-10-CM | POA: Diagnosis not present

## 2017-10-14 DIAGNOSIS — Z886 Allergy status to analgesic agent status: Secondary | ICD-10-CM | POA: Diagnosis not present

## 2017-10-14 DIAGNOSIS — M329 Systemic lupus erythematosus, unspecified: Secondary | ICD-10-CM | POA: Diagnosis not present

## 2017-10-14 DIAGNOSIS — R197 Diarrhea, unspecified: Secondary | ICD-10-CM | POA: Diagnosis not present

## 2017-10-14 DIAGNOSIS — Z95828 Presence of other vascular implants and grafts: Secondary | ICD-10-CM | POA: Diagnosis not present

## 2017-10-14 DIAGNOSIS — Z79899 Other long term (current) drug therapy: Secondary | ICD-10-CM | POA: Diagnosis not present

## 2017-10-14 DIAGNOSIS — Z09 Encounter for follow-up examination after completed treatment for conditions other than malignant neoplasm: Secondary | ICD-10-CM | POA: Diagnosis not present

## 2017-10-14 DIAGNOSIS — N271 Small kidney, bilateral: Secondary | ICD-10-CM | POA: Diagnosis not present

## 2017-10-14 DIAGNOSIS — Z885 Allergy status to narcotic agent status: Secondary | ICD-10-CM | POA: Diagnosis not present

## 2017-10-14 LAB — URINALYSIS
BILIRUBIN UA: NEGATIVE
BLOOD UA: NEGATIVE
GLUCOSE UA: NEGATIVE
KETONES UA: NEGATIVE
LEUKOCYTE ESTERASE UA: NEGATIVE
PH UA: 6 (ref 5.0–9.0)
PROTEIN UA: NEGATIVE
RBC UA: <1 /HPF (ref <=4)
SPECIFIC GRAVITY UA: 1.011 (ref 1.003–1.030)
SQUAMOUS EPITHELIAL: <1 /HPF (ref 0–5)
UROBILINOGEN UA: 0.2
WBC UA: <1 /HPF (ref 0–5)

## 2017-10-14 LAB — GLUCOSE UA: Lab: NEGATIVE

## 2017-10-14 LAB — CREATININE, URINE: Lab: 84.4

## 2017-10-14 LAB — HEMOGLOBIN A1C: Hemoglobin A1c/Hemoglobin.total:MFr:Pt:Bld:Qn:: 5.5

## 2017-10-14 LAB — PROTEIN / CREATININE RATIO, URINE: CREATININE, URINE: 84.4 mg/dL

## 2017-10-15 MED FILL — SENSIPAR/30MG/TABS: SENSIPAR/30MG/TABS | 30 days supply | Qty: 30 | Fill #1

## 2017-10-15 MED FILL — PROGRAF/0.5MG/CAP: PROGRAF/0.5MG/CAP | 30 days supply | Qty: 90 | Fill #1

## 2017-10-15 MED FILL — MYFORTIC/180MG/TAB: MYFORTIC/180MG/TAB | 30 days supply | Qty: 180 | Fill #2

## 2017-10-15 MED FILL — CARVEDILOL/25MG/TABS: CARVEDILOL/25MG/TABS | 30 days supply | Qty: 60 | Fill #6

## 2017-10-17 NOTE — Unmapped (Signed)
Pt wanted to know if it was ok taking a zpak for chest cold. I advised it was ok.

## 2017-11-05 NOTE — Unmapped (Signed)
[facility] Specialty Pharmacy Refill and Clinical Coordination Note  Medication(s): TACROLIMUS 0.5MG , MYFORTIC 180MG , SENSIPAR 30MG   - PT WANTS 2 DIFF RXS FOR TAC ON THIS AND FUTURE FILLS: SHE PREFERS   1. TAC 1MG  1 PO QAM  2. TAC 0.5MG  1 PO QPM  INSTEAD OF JUST USING 0.5MG  CAPS - WILL MESSAGE COORDINATOR TODAY    Lynn Erickson, DOB: July 14, 1965  Phone: [phone] (home) , Alternate phone contact: N/A  Shipping address: [address] [place], [place] [address]  Phone or address changes today?: No  All above HIPAA information verified.  Insurance changes? No    Completed refill and clinical call assessment today to schedule patient's medication shipment from the [facility] [phone]).      MEDICATION RECONCILIATION    Confirmed the medication and dosage are correct and have not changed: Yes, regimen is correct and unchanged.    Were there any changes to your medication(s) in the past month:  No, there are no changes reported at this time.    ADHERENCE    Is this medicine transplant or covered by Medicare Part B? Yes.    Prograf 0.5 mg   Quantity filled last month: 90   # of tablets left on hand: 12 DAYS LEFT      Myfortic 180 mg   Quantity filled last month: 180   # of tablets left on hand: 12 DAYS LEFT    SENSIPAR 30MG , LAST: 30, LEFT: 12 DAYS LEFT    Did you miss any doses in the past 4 weeks? No missed doses reported.  Adherence counseling provided? Not needed     SIDE EFFECT MANAGEMENT    Are you tolerating your medication?:  Lynn Erickson reports tolerating the medication.  Side effect management discussed: None      Therapy is appropriate and should be continued.    Evidence of clinical benefit: See Epic note from 02/26/17      FINANCIAL/SHIPPING    Delivery Scheduled: Yes, Expected medication delivery date: 11/14/17.  However, Rx request for new prescription was sent to the provider as the patient reports a change in dose.   Additional medications refilled: COREG - 5RX TOTAL    The patient will receive an FSI print out for each medication shipped and additional FDA Medication Guides as required.  Patient education from [place] or Robet Leu may also be included in the shipment.    Amada did not have any additional questions at this time.    Delivery address validated in FSI scheduling system: Yes, address listed above is correct.      We will follow up with patient monthly for standard refill processing and delivery.      Thank you,  Thad Ranger   [facility] Shared [facility] Specialty Pharmacist

## 2017-11-06 MED ORDER — PROGRAF 1 MG CAPSULE: 1 mg | capsule | Freq: Every day | 11 refills | 0 days | Status: AC

## 2017-11-06 MED ORDER — PROGRAF 0.5 MG CAPSULE
ORAL_CAPSULE | Freq: Every evening | ORAL | 11 refills | 0 days | Status: CP
Start: 2017-11-06 — End: 2018-02-14

## 2017-11-06 MED ORDER — PROGRAF 1 MG CAPSULE
ORAL_CAPSULE | Freq: Every day | ORAL | 11 refills | 0.00000 days | Status: CP
Start: 2017-11-06 — End: 2017-11-06

## 2017-11-13 MED FILL — SENSIPAR/30MG/TABS: SENSIPAR/30MG/TABS | 30 days supply | Qty: 30 | Fill #2

## 2017-11-13 MED FILL — MYFORTIC/180MG/TAB: MYFORTIC/180MG/TAB | 30 days supply | Qty: 180 | Fill #3

## 2017-11-13 MED FILL — PROGRAF/0.5MG/CAP: PROGRAF/0.5MG/CAP | 30 days supply | Qty: 30 | Fill #0

## 2017-11-13 MED FILL — CARVEDILOL/25MG/TABS: CARVEDILOL/25MG/TABS | 30 days supply | Qty: 60 | Fill #7

## 2017-11-13 MED FILL — PROGRAF/1MG/CAP: PROGRAF/1MG/CAP | 30 days supply | Qty: 30 | Fill #0

## 2017-11-21 ENCOUNTER — Encounter: Admit: 2017-11-21 | Discharge: 2017-11-22 | Payer: MEDICAID

## 2017-11-21 DIAGNOSIS — Z940 Kidney transplant status: Principal | ICD-10-CM

## 2017-11-21 DIAGNOSIS — Z09 Encounter for follow-up examination after completed treatment for conditions other than malignant neoplasm: Secondary | ICD-10-CM | POA: Diagnosis not present

## 2017-11-21 DIAGNOSIS — Z0000 Encounter for general adult medical examination without abnormal findings: Secondary | ICD-10-CM | POA: Diagnosis not present

## 2017-11-21 DIAGNOSIS — I509 Heart failure, unspecified: Secondary | ICD-10-CM | POA: Diagnosis not present

## 2017-11-21 DIAGNOSIS — N2581 Secondary hyperparathyroidism of renal origin: Secondary | ICD-10-CM | POA: Diagnosis not present

## 2017-11-21 DIAGNOSIS — I110 Hypertensive heart disease with heart failure: Secondary | ICD-10-CM | POA: Diagnosis not present

## 2017-11-21 LAB — URINALYSIS
BILIRUBIN UA: NEGATIVE
BLOOD UA: NEGATIVE
GLUCOSE UA: NEGATIVE
KETONES UA: NEGATIVE
LEUKOCYTE ESTERASE UA: NEGATIVE
NITRITE UA: NEGATIVE
PROTEIN UA: NEGATIVE
RBC UA: 0 /HPF (ref <4)
SPECIFIC GRAVITY UA: 1.01 (ref 1.005–1.040)
SQUAMOUS EPITHELIAL: 8 /HPF — ABNORMAL HIGH (ref 0–5)
UROBILINOGEN UA: 0.2
WBC UA: 1 /HPF (ref 0–5)

## 2017-11-21 LAB — BASIC METABOLIC PANEL
ANION GAP: 9 mmol/L (ref 9–15)
BLOOD UREA NITROGEN: 27 mg/dL — ABNORMAL HIGH (ref 7–21)
BUN / CREAT RATIO: 29
CALCIUM: 10.3 mg/dL — ABNORMAL HIGH (ref 8.5–10.2)
CHLORIDE: 114 mmol/L — ABNORMAL HIGH (ref 98–107)
CO2: 21 mmol/L — ABNORMAL LOW (ref 22.0–30.0)
CREATININE: 0.92 mg/dL (ref 0.60–1.00)
EGFR MDRD AF AMER: >=60 mL/min/{1.73_m2} (ref >=60)
EGFR MDRD NON AF AMER: >=60 mL/min/{1.73_m2} (ref >=60)
POTASSIUM: 5.1 mmol/L — ABNORMAL HIGH (ref 3.5–5.0)

## 2017-11-21 LAB — CBC W/ AUTO DIFF
BASOPHILS ABSOLUTE COUNT: 0 10*9/L (ref 0.0–0.1)
BASOPHILS RELATIVE PERCENT: 0.1 %
EOSINOPHILS ABSOLUTE COUNT: 0.3 10*9/L (ref 0.0–0.4)
EOSINOPHILS RELATIVE PERCENT: 5 %
HEMATOCRIT: 32.8 % — ABNORMAL LOW (ref 36.0–46.0)
HEMOGLOBIN: 10 g/dL — ABNORMAL LOW (ref 13.5–16.0)
LYMPHOCYTES ABSOLUTE COUNT: 0.8 10*9/L — ABNORMAL LOW (ref 1.5–5.0)
LYMPHOCYTES RELATIVE PERCENT: 15.1 %
MEAN CORPUSCULAR HEMOGLOBIN CONC: 30.5 g/dL — ABNORMAL LOW (ref 31.0–37.0)
MEAN CORPUSCULAR HEMOGLOBIN: 19.9 pg — ABNORMAL LOW (ref 26.0–34.0)
MEAN CORPUSCULAR VOLUME: 65.3 fL — ABNORMAL LOW (ref 80.0–100.0)
MEAN PLATELET VOLUME: 7.6 fL (ref 7.0–10.0)
MONOCYTES ABSOLUTE COUNT: 0.3 10*9/L (ref 0.2–0.8)
MONOCYTES RELATIVE PERCENT: 5.7 %
NEUTROPHILS ABSOLUTE COUNT: 3.7 10*9/L (ref 2.0–7.5)
NEUTROPHILS RELATIVE PERCENT: 72.1 %
PLATELET COUNT: 182 10*9/L (ref 150–440)
RED CELL DISTRIBUTION WIDTH: 16.9 % — ABNORMAL HIGH (ref 12.0–15.0)
WBC ADJUSTED: 5.1 10*9/L (ref 4.5–11.0)

## 2017-11-21 LAB — MAGNESIUM: Magnesium:MCnc:Pt:Ser/Plas:Qn:: 1.3 — ABNORMAL LOW

## 2017-11-21 LAB — LYMPHOCYTES RELATIVE PERCENT: Lab: 15.1

## 2017-11-21 LAB — BUN / CREAT RATIO: Urea nitrogen/Creatinine:MRto:Pt:Ser/Plas:Qn:: 29

## 2017-11-21 LAB — WBC UA: Lab: 1

## 2017-11-21 LAB — SMEAR REVIEW

## 2017-11-21 LAB — PHOSPHORUS: Phosphate:MCnc:Pt:Ser/Plas:Qn:: 3.6

## 2017-11-21 LAB — PROTEIN / CREATININE RATIO, URINE: CREATININE, URINE: 75.9 mg/dL

## 2017-11-21 LAB — PROTEIN/CREAT RATIO, URINE: Protein/Creatinine:MRto:Pt:Urine:Qn:: 0

## 2017-11-22 LAB — TACROLIMUS, TROUGH: Lab: 11.4

## 2017-11-24 LAB — CMV VIRAL LD: Lab: NOT DETECTED

## 2017-11-24 LAB — CMV DNA, QUANTITATIVE, PCR: CMV VIRAL LD: NOT DETECTED

## 2017-11-25 NOTE — Unmapped (Signed)
Patient took Prograf before labs 11.2 is not a real trough. Will follow up on repeat labs

## 2017-11-27 ENCOUNTER — Encounter: Admit: 2017-11-27 | Discharge: 2017-11-28 | Payer: MEDICAID

## 2017-11-27 DIAGNOSIS — Z0000 Encounter for general adult medical examination without abnormal findings: Secondary | ICD-10-CM

## 2017-11-27 DIAGNOSIS — Z940 Kidney transplant status: Principal | ICD-10-CM

## 2017-11-27 LAB — URINALYSIS
BILIRUBIN UA: NEGATIVE
BLOOD UA: NEGATIVE
GLUCOSE UA: NEGATIVE
KETONES UA: NEGATIVE
NITRITE UA: NEGATIVE
PH UA: 5.5 (ref 5.0–9.0)
RBC UA: <1 /HPF (ref <4)
RENAL TUBULAR EPITHELIAL CELLS: 2 /HPF — ABNORMAL HIGH
SPECIFIC GRAVITY UA: 1.015 (ref 1.005–1.040)
SQUAMOUS EPITHELIAL: <1 /HPF (ref 0–5)
UROBILINOGEN UA: 0.2
WBC UA: 7 /HPF — ABNORMAL HIGH (ref 0–5)

## 2017-11-27 LAB — CBC W/ AUTO DIFF
BASOPHILS RELATIVE PERCENT: 0.2 %
EOSINOPHILS ABSOLUTE COUNT: 0.4 10*9/L (ref 0.0–0.4)
EOSINOPHILS RELATIVE PERCENT: 9.3 %
HEMATOCRIT: 33.9 % — ABNORMAL LOW (ref 36.0–46.0)
HEMOGLOBIN: 10.2 g/dL — ABNORMAL LOW (ref 13.5–16.0)
LARGE UNSTAINED CELLS: 3 % (ref 0–4)
LYMPHOCYTES ABSOLUTE COUNT: 0.8 10*9/L — ABNORMAL LOW (ref 1.5–5.0)
LYMPHOCYTES RELATIVE PERCENT: 16.7 %
MEAN CORPUSCULAR HEMOGLOBIN CONC: 30 g/dL — ABNORMAL LOW (ref 31.0–37.0)
MEAN CORPUSCULAR HEMOGLOBIN: 19.5 pg — ABNORMAL LOW (ref 26.0–34.0)
MEAN CORPUSCULAR VOLUME: 65 fL — ABNORMAL LOW (ref 80.0–100.0)
MEAN PLATELET VOLUME: 7.8 fL (ref 7.0–10.0)
MONOCYTES ABSOLUTE COUNT: 0.4 10*9/L (ref 0.2–0.8)
MONOCYTES RELATIVE PERCENT: 7.5 %
NEUTROPHILS ABSOLUTE COUNT: 2.9 10*9/L (ref 2.0–7.5)
NEUTROPHILS RELATIVE PERCENT: 63 %
PLATELET COUNT: 196 10*9/L (ref 150–440)
RED BLOOD CELL COUNT: 5.21 10*12/L — ABNORMAL HIGH (ref 4.00–5.20)
RED CELL DISTRIBUTION WIDTH: 17.1 % — ABNORMAL HIGH (ref 12.0–15.0)
WBC ADJUSTED: 4.7 10*9/L (ref 4.5–11.0)

## 2017-11-27 LAB — MAGNESIUM: Magnesium:MCnc:Pt:Ser/Plas:Qn:: 1.4 — ABNORMAL LOW

## 2017-11-27 LAB — PROTEIN / CREATININE RATIO, URINE
CREATININE, URINE: 85.2 mg/dL
PROTEIN/CREAT RATIO, URINE: 0.053

## 2017-11-27 LAB — BASIC METABOLIC PANEL
ANION GAP: 9 mmol/L (ref 9–15)
BLOOD UREA NITROGEN: 25 mg/dL — ABNORMAL HIGH (ref 7–21)
BUN / CREAT RATIO: 33
CALCIUM: 10.3 mg/dL — ABNORMAL HIGH (ref 8.5–10.2)
CHLORIDE: 111 mmol/L — ABNORMAL HIGH (ref 98–107)
CO2: 23 mmol/L (ref 22.0–30.0)
CREATININE: 0.76 mg/dL (ref 0.60–1.00)
EGFR MDRD AF AMER: >=60 mL/min/{1.73_m2} (ref >=60)
GLUCOSE RANDOM: 95 mg/dL (ref 65–179)
POTASSIUM: 4.9 mmol/L (ref 3.5–5.0)

## 2017-11-27 LAB — MEAN CORPUSCULAR VOLUME: Lab: 65 — ABNORMAL LOW

## 2017-11-27 LAB — PROTEIN URINE: Protein:MCnc:Pt:Urine:Qn:: 4.5

## 2017-11-27 LAB — BLOOD UREA NITROGEN: Urea nitrogen:MCnc:Pt:Ser/Plas:Qn:: 25 — ABNORMAL HIGH

## 2017-11-27 LAB — PH UA: Lab: 5.5

## 2017-11-27 LAB — PHOSPHORUS: Phosphate:MCnc:Pt:Ser/Plas:Qn:: 3.4

## 2017-11-28 LAB — CMV QUANT LOG10: Lab: 0

## 2017-11-28 LAB — TACROLIMUS, TROUGH: Lab: 6.3

## 2017-11-28 LAB — CMV DNA, QUANTITATIVE, PCR: CMV VIRAL LD: NOT DETECTED

## 2017-12-08 NOTE — Unmapped (Signed)
[facility] Specialty Pharmacy Refill Coordination Note    Specialty Medication(s) to be Shipped:   Transplant: Myfortic 180mg , Prograf 0.5mg  and Prograf 1mg   Other medication(s) to be shipped: Carvedilol 25mg      Lynn Erickson, DOB: April 23, 1966  Phone: [phone] (home)   Shipping Address: [address]  [address]  [place] [address]    All above HIPAA information was verified with patient.     Completed refill call assessment today to schedule patient's medication shipment from the [facility] [phone]).       Specialty medication(s) and dose(s) confirmed: Regimen is correct and unchanged.   Changes to medications: Lynn Erickson reports no changes reported at this time.  Changes to insurance: No  Questions for the pharmacist: No    The patient will receive an FSI print out for each medication shipped and additional FDA Medication Guides as required.  Patient education from Lynn Erickson or Lynn Erickson may also be included in the shipment.    DISEASE-SPECIFIC INFORMATION        N/A    ADHERENCE     Medication Adherence    Patient reported X missed doses in the last month:  0          MEDICARE PART B DOCUMENTATION     Myfortic 180mg : Patient has 10 days worth of tablets on hand.  Prograf 0.5mg : Patient has 10 days worth of capsules on hand.  Prograf 1mg : Patient has 10 days worth of capsules on hand.    SHIPPING     Shipping address confirmed in FSI.     Delivery Scheduled: Yes, Expected medication delivery date: 12/17/2017 via UPS or courier.     Lynn Erickson   [facility] Shared [facility] Specialty Technician

## 2017-12-09 MED ORDER — CARVEDILOL 25 MG TABLET
ORAL_TABLET | Freq: Two times a day (BID) | ORAL | 11 refills | 0.00000 days | Status: CP
Start: 2017-12-09 — End: 2017-12-09

## 2017-12-09 MED ORDER — CARVEDILOL 25 MG TABLET: 25 mg | tablet | Freq: Two times a day (BID) | 11 refills | 0 days | Status: AC

## 2017-12-09 MED ORDER — CARVEDILOL 25 MG TABLET: 25 mg | tablet | 11 refills | 0 days | Status: AC

## 2017-12-17 NOTE — Unmapped (Signed)
Patient called to request delivery of prograf 1mg , 0.5mg , carvedilol and myfortic  Sending out 12/18/17 for 12/19/17    She apologized for calling late. She will be out of myfortic Thursday night but says ups usually gets it to her in the morning so her Friday dose should be ok. I told her to call us if she doesn't get it by noon so we can track it for her    [facility] Special[facility] Pharmacy Refill Coordination Note    Specialty Medication(s) to be Shipped:   Transplant: Myfortic 180mg , Prograf 1mg  and Prograf 0.5mg     Other medication(s) to be shipped: carvedilol     Lynn Erickson, DOB: 1966-06-18  Phone: [phone] (home)   Shipping Address: [address]  [address]  [place] [address]    All above HIPAA information was verified with patient.     Completed refill call assessment today to schedule patient's medication shipment from the [facility] [phone]).       Specialty medication(s) and dose(s) confirmed: Regimen is correct and unchanged.   Changes to medications: Yulieth reports no changes reported at this time.  Changes to insurance: No  Questions for the pharmacist: No    The patient will receive an FSI print out for each medication shipped and additional FDA Medication Guides as required.  Patient education from [place] or Robet Leu may also be included in the shipment.    DISEASE-SPECIFIC INFORMATION       ADHERENCE              MEDICARE PART B DOCUMENTATION     Myfortic 180mg : Patient has 9 tablets on hand.  Prograf 0.5mg : Patient has 3 capsules on hand.  Prograf 1mg : Patient has 3 capsules on hand.    SHIPPING     Shipping address confirmed in FSI.     Delivery Scheduled: Yes, Expected medication delivery date: 12/19/17 via UPS or courier.     Renette Butters   [facility] Shared [facility] Specialty Technician

## 2017-12-18 MED FILL — PROGRAF/0.5MG/CAP: PROGRAF/0.5MG/CAP | 30 days supply | Qty: 30 | Fill #1

## 2017-12-18 MED FILL — PROGRAF/1MG/CAP: PROGRAF/1MG/CAP | 30 days supply | Qty: 30 | Fill #1

## 2017-12-18 MED FILL — CARVEDILOL/25MG/TABS: CARVEDILOL/25MG/TABS | 30 days supply | Qty: 60 | Fill #0

## 2017-12-18 MED FILL — MYFORTIC/180MG/TAB: MYFORTIC/180MG/TAB | 30 days supply | Qty: 180 | Fill #4

## 2018-01-13 NOTE — Unmapped (Signed)
[facility] Pharmacy Refill Coordination Note  Specialty Medication(s): Prograf 0.5 mg, 1 mg, Myfortic  Additional Medications shipped: carvedilol, mg ox    Jodi Mourning, DOB: 10-10-1965  Phone: [phone] (home) , Alternate phone contact: N/A  Phone or address changes today?: No  All above HIPAA information was verified with patient.  Shipping Address: [address]  [address]  [place] [address]   Insurance changes? No    Completed refill call assessment today to schedule patient's medication shipment from the [facility] [phone]).      Confirmed the medication and dosage are correct and have not changed: Yes, regimen is correct and unchanged.    Confirmed patient started or stopped the following medications in the past month:  No, there are no changes reported at this time.    Are you tolerating your medication?:  Mayson reports tolerating the medication.    ADHERENCE    (Below is required for Medicare Part B or Transplant patients only - per drug):   Prograf 1 mg   Quantity filled last month: 30   # of tablets left on hand: 5      Prograf 0.5 mg   Quantity filled last month: 30   # of tablets left on hand: 5      Myfortic 180 mg   Quantity filled last month: 180   # of tablets left on hand: 30      Did you miss any doses in the past 4 weeks? No missed doses reported.    FINANCIAL/SHIPPING    Delivery Scheduled: Yes, Expected medication delivery date: Thurs, July 11     The patient will receive an FSI print out for each medication shipped and additional FDA Medication Guides as required.  Patient education from [place] or Robet Leu may also be included in the shipment    Lillien did not have any additional questions at this time.    Delivery address validated in FSI scheduling system: Yes, address listed in FSI is correct.    We will follow up with patient monthly for standard refill processing and delivery.      Thank you,  Tawanna Solo Shared [facility] Specialty Pharmacist

## 2018-01-13 NOTE — Unmapped (Signed)
Addended by: Adolphus Birchwood on: 01/13/2018 03:52 PM     Modules accepted: Orders

## 2018-01-14 MED FILL — PROGRAF/1MG/CAP: PROGRAF/1MG/CAP | 30 days supply | Qty: 30 | Fill #2

## 2018-01-14 MED FILL — PROGRAF/0.5MG/CAP: PROGRAF/0.5MG/CAP | 30 days supply | Qty: 30 | Fill #2

## 2018-01-14 MED FILL — MYFORTIC/180MG/TAB: MYFORTIC/180MG/TAB | 30 days supply | Qty: 180 | Fill #5

## 2018-01-14 MED FILL — CARVEDILOL/25MG/TABS: CARVEDILOL/25MG/TABS | 30 days supply | Qty: 60 | Fill #1

## 2018-01-14 NOTE — Unmapped (Signed)
Pt request for RX Refill

## 2018-01-19 MED ORDER — MAGNESIUM OXIDE 400 MG (241.3 MG MAGNESIUM) TABLET
ORAL_TABLET | Freq: Every morning | ORAL | 11 refills | 0.00000 days | Status: CP
Start: 2018-01-19 — End: 2019-02-08
  Filled 2018-02-19: qty 30, 30d supply, fill #0

## 2018-01-19 MED ORDER — MAGNESIUM OXIDE 400 MG (241.3 MG MAGNESIUM) TABLET: 400 mg | tablet | Freq: Every morning | 11 refills | 0 days | Status: AC

## 2018-01-20 MED FILL — MAG OXIDE (PER TABLET)/400MG/TAB: MAG OXIDE (PER TABLET)/400MG/TAB | 30 days supply | Qty: 30 | Fill #0

## 2018-01-22 ENCOUNTER — Ambulatory Visit: Admit: 2018-01-22 | Discharge: 2018-01-23 | Payer: MEDICAID

## 2018-01-22 DIAGNOSIS — Z940 Kidney transplant status: Principal | ICD-10-CM

## 2018-01-22 DIAGNOSIS — Z0000 Encounter for general adult medical examination without abnormal findings: Secondary | ICD-10-CM

## 2018-01-22 LAB — CALCIUM: Calcium:MCnc:Pt:Ser/Plas:Qn:: 10.1

## 2018-01-22 LAB — CBC W/ AUTO DIFF
BASOPHILS ABSOLUTE COUNT: 0 10*9/L (ref 0.0–0.1)
BASOPHILS RELATIVE PERCENT: 0.2 %
EOSINOPHILS ABSOLUTE COUNT: 0.2 10*9/L (ref 0.0–0.4)
EOSINOPHILS RELATIVE PERCENT: 5.9 %
HEMOGLOBIN: 10.3 g/dL — ABNORMAL LOW (ref 13.5–16.0)
LARGE UNSTAINED CELLS: 5 % — ABNORMAL HIGH (ref 0–4)
LYMPHOCYTES ABSOLUTE COUNT: 0.7 10*9/L — ABNORMAL LOW (ref 1.5–5.0)
LYMPHOCYTES RELATIVE PERCENT: 17.7 %
MEAN CORPUSCULAR HEMOGLOBIN CONC: 29.8 g/dL — ABNORMAL LOW (ref 31.0–37.0)
MEAN CORPUSCULAR HEMOGLOBIN: 19.7 pg — ABNORMAL LOW (ref 26.0–34.0)
MEAN CORPUSCULAR VOLUME: 66.2 fL — ABNORMAL LOW (ref 80.0–100.0)
MONOCYTES RELATIVE PERCENT: 6.7 %
NEUTROPHILS ABSOLUTE COUNT: 2.5 10*9/L (ref 2.0–7.5)
NEUTROPHILS RELATIVE PERCENT: 64.9 %
PLATELET COUNT: 177 10*9/L (ref 150–440)
RED BLOOD CELL COUNT: 5.21 10*12/L — ABNORMAL HIGH (ref 4.00–5.20)
RED CELL DISTRIBUTION WIDTH: 16.8 % — ABNORMAL HIGH (ref 12.0–15.0)
WBC ADJUSTED: 3.9 10*9/L — ABNORMAL LOW (ref 4.5–11.0)

## 2018-01-22 LAB — SMEAR REVIEW

## 2018-01-22 LAB — MAGNESIUM: Magnesium:MCnc:Pt:Ser/Plas:Qn:: 1.3 — ABNORMAL LOW

## 2018-01-22 LAB — BASIC METABOLIC PANEL
ANION GAP: 10 mmol/L (ref 9–15)
BLOOD UREA NITROGEN: 24 mg/dL — ABNORMAL HIGH (ref 7–21)
BUN / CREAT RATIO: 33
CALCIUM: 10.1 mg/dL (ref 8.5–10.2)
CO2: 23 mmol/L (ref 22.0–30.0)
CREATININE: 0.72 mg/dL (ref 0.60–1.00)
EGFR CKD-EPI AA FEMALE: >90 mL/min/{1.73_m2} (ref >=60)
EGFR CKD-EPI NON-AA FEMALE: >90 mL/min/{1.73_m2} (ref >=60)
GLUCOSE RANDOM: 92 mg/dL (ref 65–99)
POTASSIUM: 5.2 mmol/L — ABNORMAL HIGH (ref 3.5–5.0)
SODIUM: 145 mmol/L (ref 135–145)

## 2018-01-22 LAB — URINALYSIS
BILIRUBIN UA: NEGATIVE
BLOOD UA: NEGATIVE
KETONES UA: NEGATIVE
LEUKOCYTE ESTERASE UA: NEGATIVE
NITRITE UA: NEGATIVE
PH UA: 5.5 (ref 5.0–9.0)
PROTEIN UA: NEGATIVE
RBC UA: 0 /HPF (ref <4)
SPECIFIC GRAVITY UA: 1.008 (ref 1.005–1.040)
SQUAMOUS EPITHELIAL: <1 /HPF (ref 0–5)
UROBILINOGEN UA: 0.2
WBC UA: <1 /HPF (ref 0–5)

## 2018-01-22 LAB — PHOSPHORUS: Phosphate:MCnc:Pt:Ser/Plas:Qn:: 3.7

## 2018-01-22 LAB — PROTEIN URINE: Protein:MCnc:Pt:Urine:Qn:: <4

## 2018-01-22 LAB — PROTEIN / CREATININE RATIO, URINE

## 2018-01-22 LAB — NEUTROPHILS ABSOLUTE COUNT: Lab: 2.5

## 2018-01-22 LAB — SLIDE REVIEW

## 2018-01-22 LAB — WBC UA: Lab: <1

## 2018-01-22 LAB — TACROLIMUS, TROUGH: Lab: 8.3

## 2018-01-23 LAB — CMV QUANT LOG10: Lab: 0

## 2018-01-23 LAB — CMV DNA, QUANTITATIVE, PCR

## 2018-02-06 NOTE — Unmapped (Signed)
Current Outpatient Medications on File Prior to Visit   Medication Sig   ??? carvedilol (COREG) 25 MG tablet Take 1 tablet (25 mg total) by mouth Two (2) times a day.   ??? cinacalcet (SENSIPAR) 30 MG tablet Take 1 tablet (30 mg total) by mouth daily. 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 famotidine (PEPCID) 20 MG tablet Take 1 tablet (20 mg total) by mouth two (2) times a day as needed for heartburn.   ??? magnesium oxide (MAG-OX) 400 mg (241.3 mg magnesium) tablet Take 1 tablet (400 mg total) by mouth every morning.   ??? MYFORTIC 180 mg EC tablet Take 3 tablets (540 mg total) by mouth Two (2) times a day. Z94.0; Kidney transplant 10/12/2016   ??? PROGRAF 0.5 mg capsule Take 1 capsule (0.5 mg total) by mouth nightly. DAW1; transplant date 10/12/2016   ??? PROGRAF 1 mg capsule Take 1 capsule (1 mg total) by mouth daily.     No current facility-administered medications on file prior to visit.

## 2018-02-10 NOTE — Unmapped (Signed)
[facility] Pharmacy Refill Coordination Note  Specialty Medication(s): Prograf 0.5mg  and 1mg  capsules. Myfortic 180mg  tablets  Additional Medications shipped: Carvedilol 25mg  tablets, Mag Ox 400mg  tablets    Lynn Erickson, DOB: 1966/05/15  Phone: [phone] (home) , Alternate phone contact: N/A  Phone or address changes today?: No  All above HIPAA information was verified with patient.  Shipping Address: [address]  [address]  [place] [address]   Insurance changes? No    Completed refill call assessment today to schedule patient's medication shipment from the [facility] [phone]).      Confirmed the medication and dosage are correct and have not changed: Yes, regimen is correct and unchanged.    Confirmed patient started or stopped the following medications in the past month:  No, there are no changes reported at this time.    Are you tolerating your medication?:  Lynn Erickson reports tolerating the medication.    ADHERENCE    Is this medicine transplant or covered by Medicare Part B? Yes.    Prograf 0.5 mg   Quantity filled last month: 30   # of tablets left on hand: 13  Prograf 1 mg   Quantity filled last month: 30   # of tablets left on hand: 13  Myfortic 180 mg   Quantity filled last month: 180   # of tablets left on hand: 42            Did you miss any doses in the past 4 weeks? No missed doses reported.    FINANCIAL/SHIPPING    Delivery Scheduled: Yes, Expected medication delivery date: 02/12/18 for Myfortic and 02/20/18 for Prograf, Carvedilol, and Mag Ox     The patient will receive an FSI print out for each medication shipped and additional FDA Medication Guides as required.  Patient education from [place] or Robet Leu may also be included in the shipment.    Miral did not have any additional questions at this time.    Delivery address validated in FSI scheduling system: Yes, address listed in FSI is correct.    We will follow up with patient monthly for standard refill processing and delivery.      Thank you,  Mervyn Gay   [facility] Specialty Pharmacist

## 2018-02-11 MED FILL — MYFORTIC/180MG/TAB: MYFORTIC/180MG/TAB | 30 days supply | Qty: 180 | Fill #6

## 2018-02-11 NOTE — Unmapped (Signed)
[facility]) medication reconciliation is completed.

## 2018-02-12 MED FILL — CARVEDILOL/25MG/TABS: CARVEDILOL/25MG/TABS | 30 days supply | Qty: 60 | Fill #2

## 2018-02-16 MED ORDER — PROGRAF 0.5 MG CAPSULE
ORAL_CAPSULE | 11 refills | 0 days | Status: CP
Start: 2018-02-16 — End: 2019-01-08
  Filled 2018-02-19: qty 90, 30d supply, fill #0

## 2018-02-16 NOTE — Unmapped (Signed)
Pt advised that rx strength is changed to 0.5mg  caps. Dose will be two (0.5)mg in the morning and one (0.5mg ) in the night

## 2018-02-19 MED FILL — MAGNESIUM OXIDE 400 MG (241.3 MG MAGNESIUM) TABLET: 30 days supply | Qty: 30 | Fill #0 | Status: AC

## 2018-02-19 MED FILL — PROGRAF 1 MG CAPSULE: 30 days supply | Qty: 30 | Fill #0 | Status: AC

## 2018-02-19 MED FILL — PROGRAF 1 MG CAPSULE: ORAL | 30 days supply | Qty: 30 | Fill #0

## 2018-02-19 MED FILL — PROGRAF 0.5 MG CAPSULE: 30 days supply | Qty: 90 | Fill #0 | Status: AC

## 2018-03-03 NOTE — Unmapped (Signed)
[facility] Refill and Clinical Coordination Note  Medication(s): SENSIPAR  Pt states she has atleast 3 weeks of myfortic and prograf 1 and 0.5, requests call back in 2 weeks, adjusted refill call date   Went over all meds with pt on call today. Besides meds listed in this note, pt denies needing ANYTHING else filled at this time. Pt aware to call us with any needs/concerns/refills if need arises prior to our next scheduled refill call.    Lynn Erickson, DOB: 1965/10/30  Phone: [phone] (home) , Alternate phone contact: N/A  Shipping address: [address]  [address]  [place] [address]  Phone or address changes today?: No  All above HIPAA information verified.  Insurance changes? No    Completed refill and clinical call assessment today to schedule patient's medication shipment from the [facility] [phone]).      MEDICATION RECONCILIATION    Confirmed the medication and dosage are correct and have not changed: Yes, regimen is correct and unchanged.    Were there any changes to your medication(s) in the past month:  No, there are no changes reported at this time.    ADHERENCE    Is this medicine transplant or covered by Medicare Part B? No.    Not part b sensipar    Did you miss any doses in the past 4 weeks? No missed doses reported.  Adherence counseling provided? Not needed     SIDE EFFECT MANAGEMENT    Are you tolerating your medication?:  Lynn Erickson reports tolerating the medication.  Side effect management discussed: None      Therapy is appropriate and should be continued.    Evidence of clinical benefit: See Epic note from 10/14/17      FINANCIAL/SHIPPING    Delivery Scheduled: Yes, Expected medication delivery date: 03/05/18 via ups     Additional medications refilled: No additional medications/refills needed at this time.    The patient will receive a drug information handout for each medication shipped and additional FDA Medication Guides as required. Lynn Erickson did not have any additional questions at this time.    Delivery address confirmed in Epic.     We will follow up with patient monthly for standard refill processing and delivery.      Thank you,  Thad Ranger   [facility] Shared [facility] Specialty Pharmacist

## 2018-03-04 MED FILL — SENSIPAR 30 MG TABLET: 30 days supply | Qty: 30 | Fill #0 | Status: AC

## 2018-03-04 MED FILL — SENSIPAR 30 MG TABLET: ORAL | 30 days supply | Qty: 30 | Fill #0

## 2018-03-10 MED FILL — CARVEDILOL 25 MG TABLET: ORAL | 30 days supply | Qty: 60 | Fill #0

## 2018-03-10 MED FILL — CARVEDILOL 25 MG TABLET: 30 days supply | Qty: 60 | Fill #0 | Status: AC

## 2018-03-17 ENCOUNTER — Encounter: Admit: 2018-03-17 | Discharge: 2018-03-18 | Payer: MEDICAID

## 2018-03-17 DIAGNOSIS — Z0000 Encounter for general adult medical examination without abnormal findings: Secondary | ICD-10-CM

## 2018-03-17 DIAGNOSIS — Z940 Kidney transplant status: Principal | ICD-10-CM

## 2018-03-17 LAB — BASIC METABOLIC PANEL
ANION GAP: 9 mmol/L (ref 9–15)
BLOOD UREA NITROGEN: 28 mg/dL — ABNORMAL HIGH (ref 7–21)
BUN / CREAT RATIO: 31
CALCIUM: 9.8 mg/dL (ref 8.5–10.2)
CHLORIDE: 109 mmol/L — ABNORMAL HIGH (ref 98–107)
CO2: 22 mmol/L (ref 22.0–30.0)
CREATININE: 0.89 mg/dL (ref 0.60–1.00)
EGFR CKD-EPI NON-AA FEMALE: 74 mL/min/{1.73_m2} (ref >=60)
GLUCOSE RANDOM: 95 mg/dL (ref 65–179)
POTASSIUM: 4.8 mmol/L (ref 3.5–5.0)
SODIUM: 140 mmol/L (ref 135–145)

## 2018-03-17 LAB — SODIUM: Sodium:SCnc:Pt:Ser/Plas:Qn:: 140

## 2018-03-17 LAB — PROTEIN / CREATININE RATIO, URINE
CREATININE, URINE: 50.4 mg/dL
PROTEIN URINE: <4 mg/dL

## 2018-03-17 LAB — SLIDE REVIEW

## 2018-03-17 LAB — TACROLIMUS, TROUGH: Lab: 9.8

## 2018-03-17 LAB — URINALYSIS
BILIRUBIN UA: NEGATIVE
GLUCOSE UA: NEGATIVE
LEUKOCYTE ESTERASE UA: NEGATIVE
NITRITE UA: NEGATIVE
PH UA: 6 (ref 5.0–9.0)
PROTEIN UA: NEGATIVE
RBC UA: 0 /HPF (ref <4)
SPECIFIC GRAVITY UA: 1.008 (ref 1.005–1.040)
SQUAMOUS EPITHELIAL: <1 /HPF (ref 0–5)
UROBILINOGEN UA: 0.2
WBC UA: <1 /HPF (ref 0–5)

## 2018-03-17 LAB — CBC W/ AUTO DIFF
BASOPHILS ABSOLUTE COUNT: 0 10*9/L (ref 0.0–0.1)
BASOPHILS RELATIVE PERCENT: 0.3 %
EOSINOPHILS ABSOLUTE COUNT: 0.4 10*9/L (ref 0.0–0.4)
EOSINOPHILS RELATIVE PERCENT: 10.6 %
HEMATOCRIT: 32 % — ABNORMAL LOW (ref 36.0–46.0)
HEMOGLOBIN: 9.9 g/dL — ABNORMAL LOW (ref 13.5–16.0)
LARGE UNSTAINED CELLS: 3 % (ref 0–4)
LYMPHOCYTES ABSOLUTE COUNT: 0.7 10*9/L — ABNORMAL LOW (ref 1.5–5.0)
LYMPHOCYTES RELATIVE PERCENT: 15.7 %
MEAN CORPUSCULAR HEMOGLOBIN CONC: 30.9 g/dL — ABNORMAL LOW (ref 31.0–37.0)
MEAN CORPUSCULAR HEMOGLOBIN: 19.8 pg — ABNORMAL LOW (ref 26.0–34.0)
MEAN CORPUSCULAR VOLUME: 64 fL — ABNORMAL LOW (ref 80.0–100.0)
MEAN PLATELET VOLUME: 6.6 fL — ABNORMAL LOW (ref 7.0–10.0)
MONOCYTES ABSOLUTE COUNT: 0.3 10*9/L (ref 0.2–0.8)
MONOCYTES RELATIVE PERCENT: 7.6 %
NEUTROPHILS ABSOLUTE COUNT: 2.6 10*9/L (ref 2.0–7.5)
PLATELET COUNT: 153 10*9/L (ref 150–440)
RED BLOOD CELL COUNT: 5 10*12/L (ref 4.00–5.20)
RED CELL DISTRIBUTION WIDTH: 16.4 % — ABNORMAL HIGH (ref 12.0–15.0)
WBC ADJUSTED: 4.2 10*9/L — ABNORMAL LOW (ref 4.5–11.0)

## 2018-03-17 LAB — MAGNESIUM: Magnesium:MCnc:Pt:Ser/Plas:Qn:: 1.2 — ABNORMAL LOW

## 2018-03-17 LAB — PROTEIN/CREAT RATIO, URINE: Protein/Creatinine:MRto:Pt:Urine:Qn:: 0

## 2018-03-17 LAB — PHOSPHORUS: Phosphate:MCnc:Pt:Ser/Plas:Qn:: 3.7

## 2018-03-17 LAB — WBC UA: Lab: <1

## 2018-03-17 LAB — EOSINOPHILS ABSOLUTE COUNT: Lab: 0.4

## 2018-03-17 LAB — SMEAR REVIEW: Lab: 0

## 2018-03-18 ENCOUNTER — Encounter: Admit: 2018-03-18 | Discharge: 2018-03-18 | Payer: MEDICAID | Attending: Nephrology | Primary: Nephrology

## 2018-03-18 ENCOUNTER — Encounter: Admit: 2018-03-18 | Discharge: 2018-03-18 | Payer: MEDICAID

## 2018-03-18 DIAGNOSIS — D899 Disorder involving the immune mechanism, unspecified: Principal | ICD-10-CM

## 2018-03-18 DIAGNOSIS — Z940 Kidney transplant status: Secondary | ICD-10-CM

## 2018-03-18 DIAGNOSIS — Z23 Encounter for immunization: Secondary | ICD-10-CM | POA: Diagnosis not present

## 2018-03-18 DIAGNOSIS — Z0000 Encounter for general adult medical examination without abnormal findings: Secondary | ICD-10-CM | POA: Diagnosis not present

## 2018-03-18 LAB — URINALYSIS
BACTERIA: NONE SEEN /HPF
BILIRUBIN UA: NEGATIVE
BLOOD UA: NEGATIVE
GLUCOSE UA: NEGATIVE
KETONES UA: NEGATIVE
PH UA: 6 (ref 5.0–9.0)
PROTEIN UA: NEGATIVE
RBC UA: <1 /HPF (ref <=4)
SPECIFIC GRAVITY UA: 1.01 (ref 1.003–1.030)
SQUAMOUS EPITHELIAL: <1 /HPF (ref 0–5)
UROBILINOGEN UA: 0.2
WBC UA: 1 /HPF (ref 0–5)

## 2018-03-18 LAB — CMV DNA, QUANTITATIVE, PCR: CMV VIRAL LD: NOT DETECTED

## 2018-03-18 LAB — PROTEIN / CREATININE RATIO, URINE: PROTEIN URINE: <4 mg/dL

## 2018-03-18 LAB — RBC UA: Lab: <1

## 2018-03-18 LAB — PROTEIN URINE: Protein:MCnc:Pt:Urine:Qn:: <4

## 2018-03-18 LAB — CMV QUANT LOG10: Lab: 0

## 2018-03-18 MED FILL — MYFORTIC 180 MG TABLET,DELAYED RELEASE: 30 days supply | Qty: 180 | Fill #0

## 2018-03-18 MED FILL — MYFORTIC 180 MG TABLET,DELAYED RELEASE: 30 days supply | Qty: 180 | Fill #0 | Status: AC

## 2018-03-18 MED FILL — PROGRAF 0.5 MG CAPSULE: 30 days supply | Qty: 90 | Fill #1 | Status: AC

## 2018-03-18 MED FILL — MAGNESIUM OXIDE 400 MG (241.3 MG MAGNESIUM) TABLET: ORAL | 30 days supply | Qty: 30 | Fill #1

## 2018-03-18 MED FILL — PROGRAF 0.5 MG CAPSULE: 30 days supply | Qty: 90 | Fill #1

## 2018-03-18 MED FILL — MAGNESIUM OXIDE 400 MG (241.3 MG MAGNESIUM) TABLET: 30 days supply | Qty: 30 | Fill #1 | Status: AC

## 2018-03-18 NOTE — Unmapped (Addendum)
[facility] transplant nephrology clinic visit    assessment and plan  1. s/p kidney transplant 10/12/2016. baseline creatinine 0.7-0.8 mg/dl. no proteinuria. no donor specific hla ab detected. no change in maintenance immunosuppression. tacrolimus 12hr lvl 6-8 ng/ml.  2. hypertension. blood pressure goal < 130/80 mmhg.   3. nocturia. +[facility] u[facility] referral.  4. preventive medicine. influenza '19. pcv13 pneumococcal '17. ppsv23 pneumococcal '19. zoster '19. nl mammogram '19. nl ob/gyn exam '19. colonoscopy '16 with 5 year follow up recommended. renal ultrasound '19.    history of present illness    [name] is a 52 year old woman seen in follow up post kidney transplant 10/12/2016. she feels well in general. no fever chills or sweats. +mild headache. no lightheaded. no chest pain palpitations orthopnea or shortness of breath. +lower extremity edema with prolonged standing. no abdominal pain no n/v/d. no dysuria. +nocturia mult times/night. +mild tremor. all other systems reviewed and negative x10 systems.    past medical hx:  1. 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 baseline creatinine 0.7-0.8 mg/dl.  2. systemic lupus erythematosus   3. hypertension  4. secondary/tertiary hyperparathyroidism    past surgical hx: left upper ext av graft x2 '07-08. bilateral tubal ligation. kidney transplant '18. left upper ext avg ligation '19.  ??  allergies: penicillin, vancomycin, aspirin, shellfish, hydroxychloroquine.  ??  medications: tacrolimus 1mg /0.5mg  am/pm, mycophenolate sodium 540mg  bid, carvedilol 25mg  bid, cinacalcet 30mg  daily, mg oxide 400mg  daily, famotidine 20mg  prn.   ??  soc hx: married x4 children. no smoking hx     physical exam: t97.6 p80 bp140/82 wt68.5kg bmi 35.1. wd/wn woman nl/appropriate affect and mood. nl sclera anicteric. mmm no thrush. neck supple no palpable ln. heart rrr nl s1s2 no m/r/g. lungs clear bilateral. abd soft nt/nd. no lower ext edema. msk no synovitis/tophi. skin no rash. neuro alert oriented non focal exam.    labs 03/17/18: wbc4.2 hgb9.9 hct32.0 plts153. na140 k4.8 cl109 bicarb22 bun28 cr0.9 glc95 ca9.8 mg1.2 phos3.7. cmv pcr not detected. tacrolimus 12hr lvl 9.8 ng/ml. urine protein not detected.    scribe's attestation: Leontina Skidmore, md obtained and performed the history, physical exam and medical decision making elements that were entered into the chart. signed by [place] ormond foster, scribe, on March 18, 2018 3:22 pm.    ----------------------------------------------------------------------------------------------------------------------  March 22, 2018 10:34 PM. documentation assistance provided by the scribe. i was present during the time the encounter was recorded. the information recorded by the scribe was done at my direction and has been reviewed and validated by me.  ----------------------------------------------------------------------------------------------------------------------

## 2018-03-18 NOTE — Unmapped (Signed)
[facility] Specialty Pharmacy Refill Coordination Note  Specialty Medication(s): Prograf 0.5mg , Myfortic 180mg   Additional Medications shipped: magnesium    Lynn Erickson, DOB: [birth year]  Phone: [phone] (home) , Alternate phone contact: N/A  Phone or address changes today?: No  All above HIPAA information was verified with patient.  Shipping Address: [address]  [address]  [place] [address]   Insurance changes? No    Completed refill call assessment today to schedule patient's medication shipment from the [facility] [phone]).      Confirmed the medication and dosage are correct and have not changed: Yes, regimen is correct and unchanged.    Confirmed patient started or stopped the following medications in the past month:  No, there are no changes reported at this time.    Are you tolerating your medication?:  Lynn Erickson reports tolerating the medication.    ADHERENCE    Prograf 0.5 mg   Quantity filled last month: 90   # of tablets left on hand: 3 days  Myfortic 180 mg   Quantity filled last month: 180   # of tablets left on hand: 1 days      Did you miss any doses in the past 4 weeks? No missed doses reported.    FINANCIAL/SHIPPING    Delivery Scheduled: Yes, Expected medication delivery date: 03/19/18     The patient will receive a drug information handout for each medication shipped and additional FDA Medication Guides as required.      Lynn Erickson did not have any additional questions at this time.    Delivery address validated in Epic.    We will follow up with patient monthly for standard refill processing and delivery.      Thank you,  Lupita Shutter   [facility] Specialty Pharmacist

## 2018-03-25 DIAGNOSIS — Z940 Kidney transplant status: Secondary | ICD-10-CM | POA: Diagnosis not present

## 2018-03-25 DIAGNOSIS — I10 Essential (primary) hypertension: Secondary | ICD-10-CM | POA: Diagnosis not present

## 2018-03-25 DIAGNOSIS — M329 Systemic lupus erythematosus, unspecified: Secondary | ICD-10-CM | POA: Diagnosis not present

## 2018-03-25 DIAGNOSIS — Z0000 Encounter for general adult medical examination without abnormal findings: Secondary | ICD-10-CM | POA: Diagnosis not present

## 2018-03-25 DIAGNOSIS — R413 Other amnesia: Secondary | ICD-10-CM | POA: Diagnosis not present

## 2018-03-25 DIAGNOSIS — D899 Disorder involving the immune mechanism, unspecified: Secondary | ICD-10-CM | POA: Diagnosis not present

## 2018-03-26 ENCOUNTER — Ambulatory Visit: Admit: 2018-03-26 | Discharge: 2018-03-27 | Payer: MEDICAID | Attending: Urology | Primary: Urology

## 2018-03-26 DIAGNOSIS — R339 Retention of urine, unspecified: Principal | ICD-10-CM

## 2018-03-26 DIAGNOSIS — N3281 Overactive bladder: Secondary | ICD-10-CM | POA: Diagnosis not present

## 2018-03-26 DIAGNOSIS — R351 Nocturia: Secondary | ICD-10-CM | POA: Diagnosis not present

## 2018-03-26 DIAGNOSIS — I120 Hypertensive chronic kidney disease with stage 5 chronic kidney disease or end stage renal disease: Secondary | ICD-10-CM | POA: Diagnosis not present

## 2018-03-26 DIAGNOSIS — R3914 Feeling of incomplete bladder emptying: Secondary | ICD-10-CM | POA: Diagnosis not present

## 2018-03-26 DIAGNOSIS — Z886 Allergy status to analgesic agent status: Secondary | ICD-10-CM | POA: Diagnosis not present

## 2018-03-26 DIAGNOSIS — Z940 Kidney transplant status: Secondary | ICD-10-CM | POA: Diagnosis not present

## 2018-03-26 DIAGNOSIS — N186 End stage renal disease: Secondary | ICD-10-CM | POA: Diagnosis not present

## 2018-03-26 DIAGNOSIS — Z880 Allergy status to penicillin: Secondary | ICD-10-CM | POA: Diagnosis not present

## 2018-03-26 MED ORDER — MIRABEGRON ER 50 MG TABLET,EXTENDED RELEASE 24 HR
ORAL_TABLET | Freq: Every day | ORAL | 11 refills | 0 days | Status: CP
Start: 2018-03-26 — End: 2018-07-21
  Filled 2018-03-27: qty 30, 30d supply, fill #0

## 2018-03-26 NOTE — Unmapped (Signed)
Bladder Scan:       Volume: 0 ml's.       Performed by: Fredrich Birks, CNA.       Void status: Post void residual.

## 2018-03-26 NOTE — Unmapped (Signed)
[facility]  - Office Visit    Assessment:    52F with bothersome nocturia, s/p renal transplant (10/12/16) secondary to lupus nephritis. 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    Plan:    1. Fluid restriction (if ok with nephrology) in the evenings. Compression hose. Elevation of legs throughout the day and especially in the evening several hours prior to bed time.  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 She cannot tolerate anti-cholinergics as she already has extremely symptomatic dry eyes and mouth from her lupus. Given her significantly contracted bladder, she will have to be on maximal doses of these bladder medications to hopefully avoid something more invasive like botox or bladder augment, and there is nearly 0% chance she could even tolerate the lowest does of oxybutynin, which wouldn't be effective anyway.    Referring Physician:   Griffin Dakin, MD  [address]  [id number]; [address]  [place], [place] [address]    PCP:   [facility]    Chief Complaint   Patient presents with   ??? Incomplete Bladder Emptying     PVR 0mL       Subjective:    HPI:   52 y.o. female seen in consultation at the request of Griffin Dakin, * for evaluation. Prior to transplant, was anuric for 11 years.     Frequency: 3-4x per day  Urgency: frequently  Urge incontinence: none  Nocturia: 7-8 times per night    No evidence of infection. She does have dependent edema.     PMH:  Past Medical History:   Diagnosis Date   ??? End stage renal disease (CMS-HCC)     on dialysis since 2007   ??? Hypertension    ??? Kidney transplant 10/12/2016 10/13/2016   ??? Nephrolithiasis 1980s   ??? Systemic lupus erythematosus (CMS-HCC)        PSH:  Past Surgical History:   Procedure Laterality Date   ??? AV FISTULA PLACEMENT, BRACHIOCEPHALIC Left    ??? PR LIGATN ANGIOACCESS AV FISTULA Left 08/15/2017    Procedure: LIGATION OR BANDING OF ANGIOACCESS ARTERIOVENOUS FISTULA;  Surgeon: Leona Carry, MD;  Location: MAIN OR [facility];  Service: Transplant   ??? PR TRANSPLANT,PREP CADAVER RENAL GRAFT N/A 10/12/2016    Procedure: [facility] STD PREP CAD DONR RENAL ALLOGFT PRIOR TO TRNSPLNT, INCL DISSEC/REM PERINEPH FAT, DIAPH/RTPER ATTAC;  Surgeon: Leona Carry, MD;  Location: MAIN OR [facility];  Service: Transplant   ??? PR TRANSPLANTATION OF KIDNEY Right 10/12/2016    Procedure: RENAL ALLOTRANSPLANTATION, IMPLANTATION OF GRAFT; WITHOUT RECIPIENT NEPHRECTOMY;  Surgeon: Leona Carry, MD;  Location: MAIN OR [facility];  Service: Transplant   ??? tubal ligation Bilateral        Medications:  Current Outpatient Medications   Medication Sig Dispense Refill   ??? carvedilol (COREG) 25 MG tablet TAKE 1 TABLET BY MOUTH TWICE DAILY 60 tablet 11   ??? famotidine (PEPCID) 20 MG tablet TAKE 1 TABLET BY MOUTH TWICE DAILY AS NEEDED FOR HEARTBURN 60 tablet 11   ??? magnesium oxide (MAG-OX) 400 mg (241.3 mg magnesium) tablet TAKE 1 TABLET BY MOUTH ONCE DAILY IN THE MORNING 30 tablet 11   ??? MYFORTIC 180 mg EC tablet Take 3 tablets (540 mg total) by mouth Two (2) times a day. Z94.0; Kidney transplant 10/12/2016 180 tablet 11   ??? PROGRAF 0.5 mg capsule Take 2 capsules by mouth in the morning,  and take 1 capsule in the evening. 90 capsule 11   ??? SENSIPAR 30 mg tablet TAKE 1 TABLET BY MOUTH ONCE DAILY (Patient taking differently: Take 30 mg by mouth nightly. 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        Allergies:  Plaquenil [hydroxychloroquine]; Shellfish containing products; Bleomycin; Aspirin; Penicillins; and Vancomycin analogues     Social History:  Patient  reports that she has never smoked. She has never used smokeless tobacco. She reports that she does not drink alcohol or use drugs.    Family History:  The patient's family history includes Cancer in her father and mother; Hypertension in her brother and sister; Lupus in her mother.    ROS:   A comprehensive 10-system review was negative, except as noted in HPI.    BP 124/61 (BP Site: R Arm, BP Position: Sitting, BP Cuff Size: Medium)  - Pulse 86  - Temp 36.8 ??C (98.3 ??F) (Temporal)  - Resp 18  - Ht 139.7 cm (4' 7)  - Wt 68.5 kg (151 lb 0.2 oz)  - Breastfeeding? No  - BMI 35.10 kg/m??     Physical Exam:    General: well developed, well nourished, no acute distress  HEENT: PERLA, EOM intact, normocephalic, atraumatic  Neck: supple and no masses  Chest: symmetrical  Lungs: non-labored breathing  Heart: normal rhythm, no JVD  Abdomen: no tenderness, no masses or hernias, no palpable organomegaly  GU: deferred  Rectal: deferred  Extremities: no deformities, no edema, no cyanosis    LAB RESULTS:   Results for orders placed or performed in visit on 03/18/18   Urine Culture   Result Value Ref Range    Urine Culture, Comprehensive NO SIGNIFICANT GROWTH: <1000 CFU/mL    Protein/Creatinine Ratio, Urine   Result Value Ref Range    Creat U 53.7 Undefined mg/dL    Protein, Ur <1.3 Undefined mg/dL    Protein/Creatinine Ratio, Urine     Urinalysis   Result Value Ref Range    Color, UA Yellow     Clarity, UA Clear     Specific Gravity, UA 1.010 1.003 - 1.030    pH, UA 6.0 5.0 - 9.0    Leukocyte Esterase, UA Negative Negative    Nitrite, UA Negative Negative    Protein, UA Negative Negative    Glucose, UA Negative Negative    Ketones, UA Negative Negative    Urobilinogen, UA 0.2 mg/dL 0.2 mg/dL, 1.0 mg/dL    Bilirubin, UA Negative Negative    Blood, UA Negative Negative    RBC, UA <1 <=4 /HPF    WBC, UA 1 0 - 5 /HPF    Squam Epithel, UA <1 0 - 5 /HPF    Bacteria, UA None Seen None Seen /HPF

## 2018-03-27 MED FILL — MYRBETRIQ 50 MG TABLET,EXTENDED RELEASE: 30 days supply | Qty: 30 | Fill #0 | Status: AC

## 2018-04-14 NOTE — Unmapped (Signed)
[facility] Specialty Pharmacy Refill Coordination Note    Specialty Medication(s) to be Shipped:   Transplant: Myfortic 180mg  and Prograf 0.5mg     Other medication(s) to be shipped:   carvedilol (COREG) 25 MG  SENSIPAR 30 mg     **Faxed MD on Myfortic 180mg  renewal      Jodi Mourning, DOB: Dec 03, 1965  Phone: [phone] (home)   Shipping Address: [address]  [address]  [place] [address]    All above HIPAA information was verified with patient.     Completed refill call assessment today to schedule patient's medication shipment from the [facility] [phone]).       Specialty medication(s) and dose(s) confirmed: Regimen is correct and unchanged.   Changes to medications: Adelayde reports no changes reported at this time.  Changes to insurance: No  Questions for the pharmacist: No    The patient will receive a drug information handout for each medication shipped and additional FDA Medication Guides as required.      DISEASE/MEDICATION-SPECIFIC INFORMATION        N/A    ADHERENCE              MEDICARE PART B DOCUMENTATION     Myfortic 180mg : Patient has 4 days worth of tablets on hand.  Prograf 0.5mg : Patient has 4 days worth of capsules on hand.    SHIPPING     Shipping address confirmed in Epic.     Delivery Scheduled: Yes, Expected medication delivery date: 04/17/18 via UPS or courier.     [place] A Daylani Deblois   [facility] Shared [facility] Specialty Technician

## 2018-04-16 MED ORDER — MYFORTIC 180 MG TABLET,DELAYED RELEASE
ORAL_TABLET | 11 refills | 0 days | Status: CP
Start: 2018-04-16 — End: 2019-04-16
  Filled 2018-04-16: qty 180, 30d supply, fill #0

## 2018-04-16 MED FILL — PROGRAF 0.5 MG CAPSULE: 30 days supply | Qty: 90 | Fill #2 | Status: AC

## 2018-04-16 MED FILL — CARVEDILOL 25 MG TABLET: 30 days supply | Qty: 60 | Fill #1 | Status: AC

## 2018-04-16 MED FILL — SENSIPAR 30 MG TABLET: 30 days supply | Qty: 30 | Fill #1 | Status: AC

## 2018-04-16 MED FILL — CARVEDILOL 25 MG TABLET: ORAL | 30 days supply | Qty: 60 | Fill #1

## 2018-04-16 MED FILL — SENSIPAR 30 MG TABLET: ORAL | 30 days supply | Qty: 30 | Fill #1

## 2018-04-16 MED FILL — PROGRAF 0.5 MG CAPSULE: 30 days supply | Qty: 90 | Fill #2

## 2018-04-16 MED FILL — MYFORTIC 180 MG TABLET,DELAYED RELEASE: 30 days supply | Qty: 180 | Fill #0 | Status: AC

## 2018-05-06 IMAGING — US US EXTREM LOW*R* LIMITED
1 series · 14 of 25 positions shown · non-contrast
Comparison: None.

CLINICAL DATA: Right groin pain. Evaluate for pseudoaneurysm. Post
right femoral dialysis catheter placement.

EXAM:
ULTRASOUND RIGHT LOWER EXTREMITY LIMITED
TECHNIQUE: Ultrasound examination of the lower extremity soft tissues was
performed in the area of clinical concern.

[Series 1: us extrem low*right* limited · 0.08mm/px · 14 of 28 slices shown]
[im 1/28]
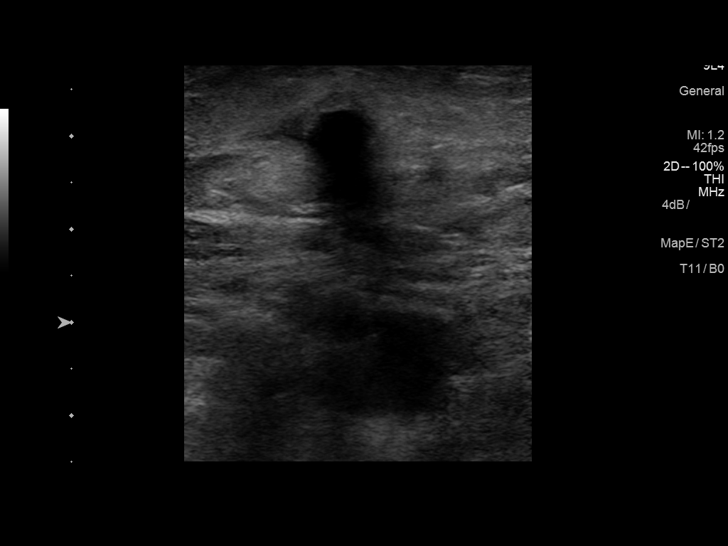
[im 3/28]
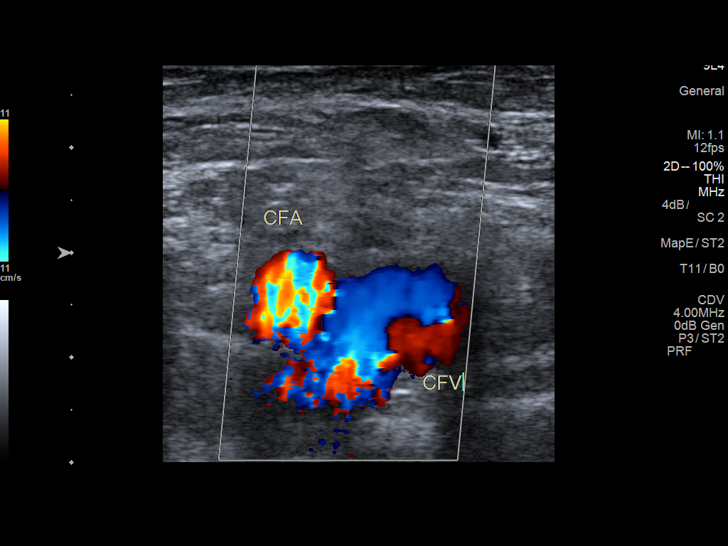
[im 5/28]
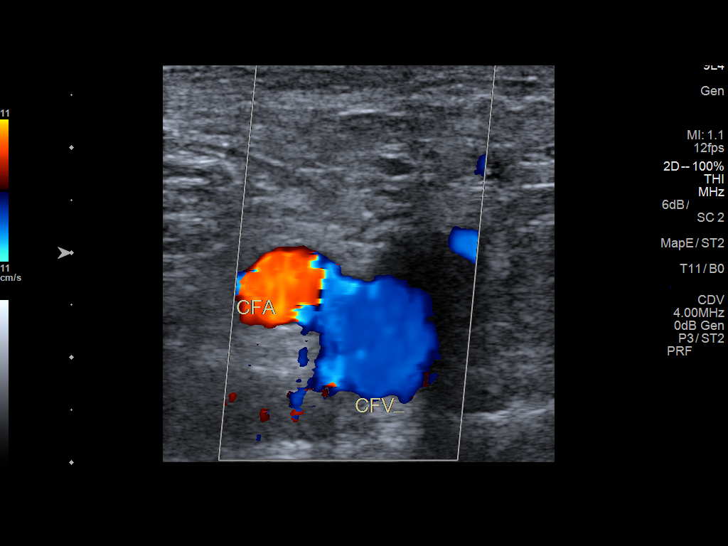
[im 7/28]
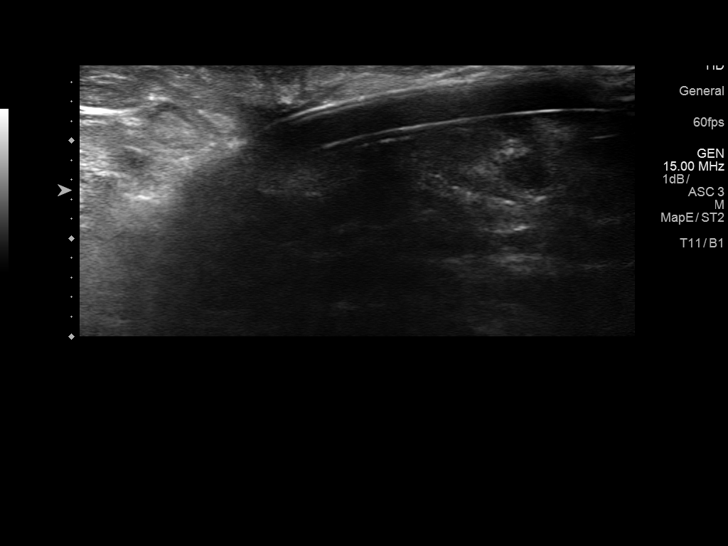
[im 10/28]
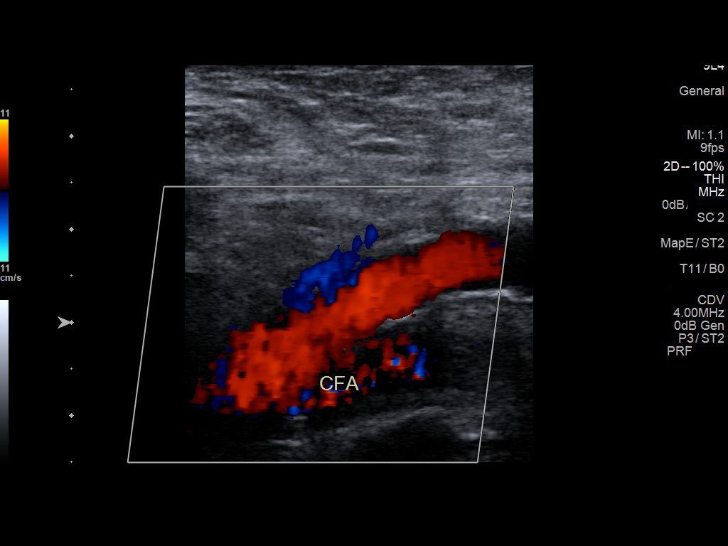
[im 11/28]
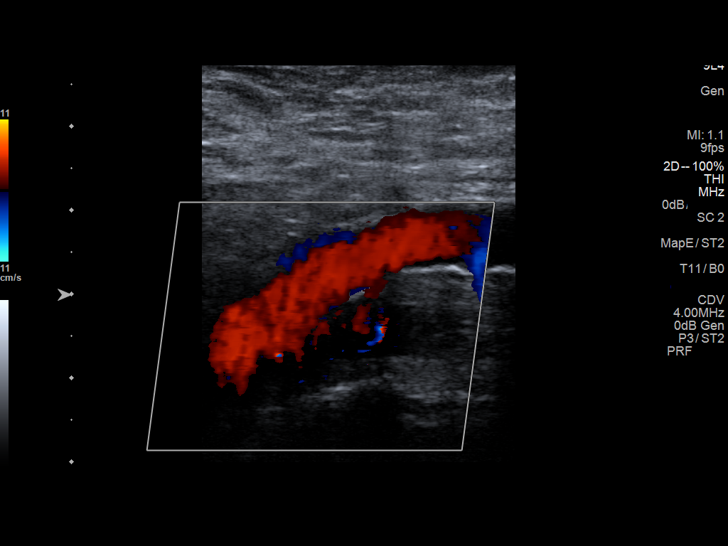
[im 13/28]
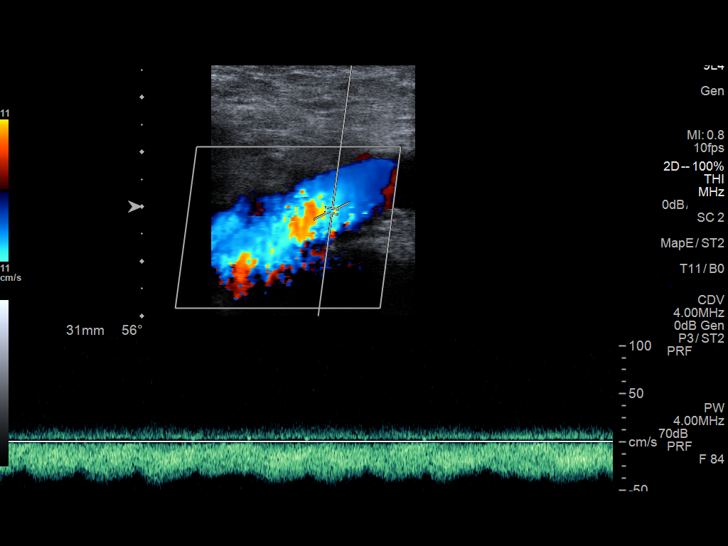
[im 15/28]
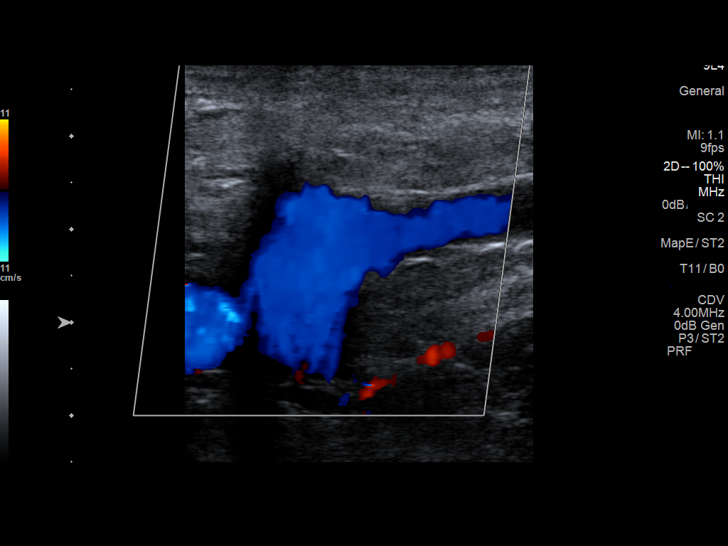
[im 17/28]
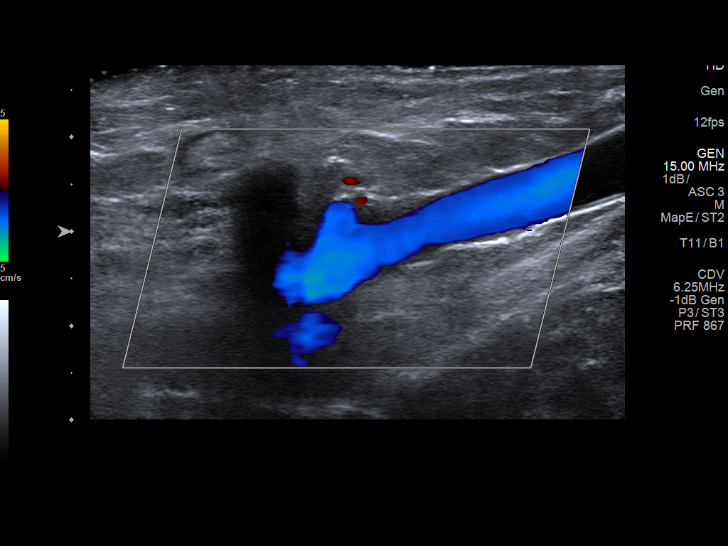
[im 19/28]
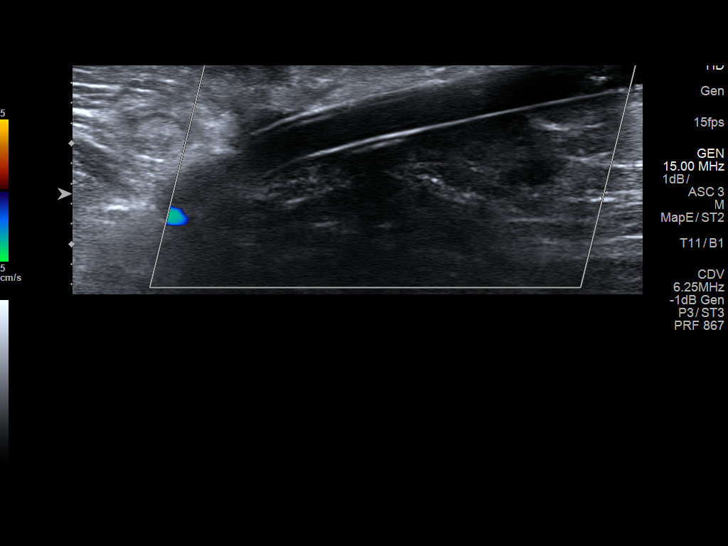
[im 21/28]
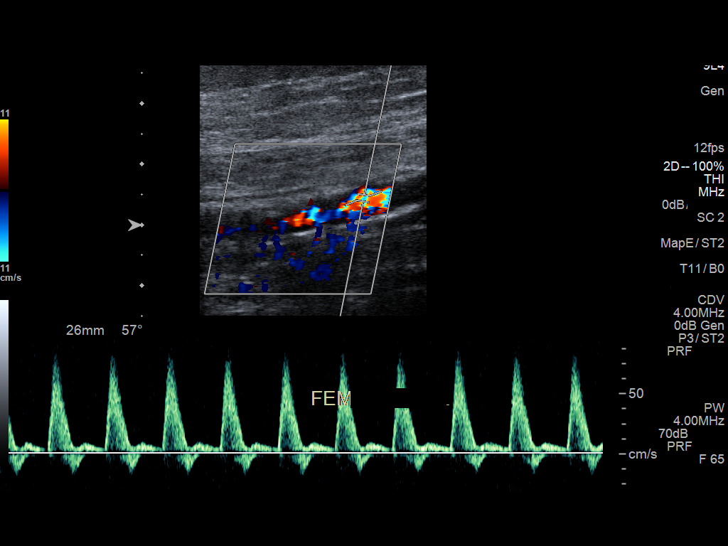
[im 23/28]
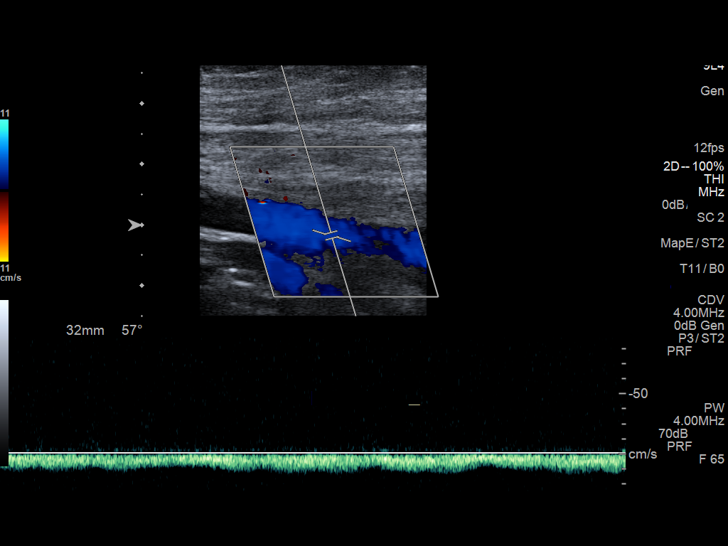
[im 25/28]
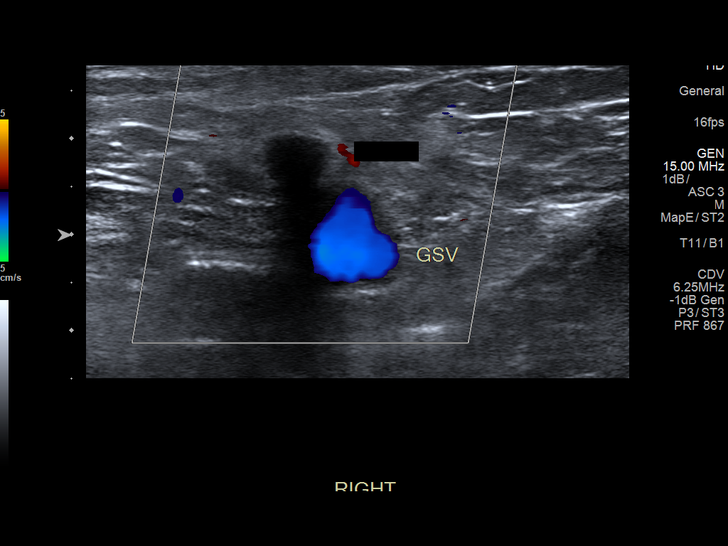
[im 28/28]
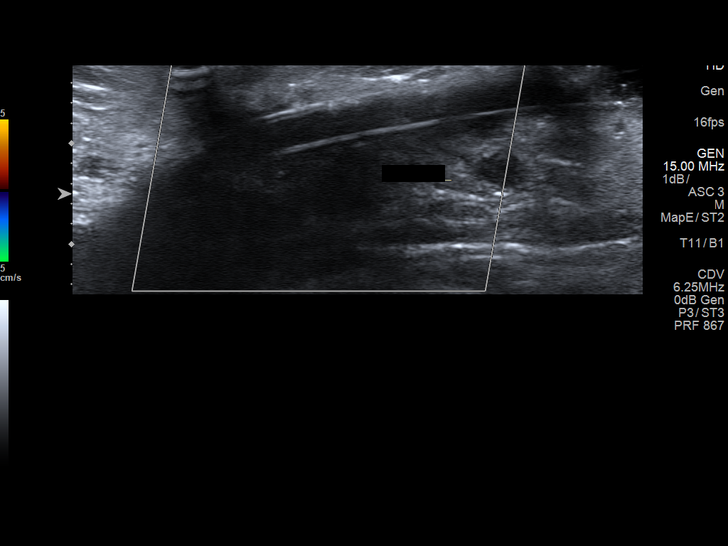

[14 of 25 positions shown; findings below may reference images not displayed]

FINDINGS: Targeted sonographic evaluation of the right groin demonstrates
dialysis catheter in place. No fluid collection or pseudoaneurysm.
No focal vascular outpouching is seen sonographically. Arterial flow
is seen in the femoral artery. Venous flow seen in the common
femoral, femoral, and greater saphenous veins. No free fluid.
IMPRESSION: No evidence of pseudoaneurysm about right groin dialysis catheter.
Arterial and venous flow was seen in the regional vessels.

## 2018-05-06 NOTE — Unmapped (Signed)
[facility] Special[facility] Pharmacy Refill and Clinical Coordination Note  Medication(s): Myfortic, Sensipar  Additional Meds: Carvedilol, Magnesium    Lynn Erickson, DOB: Sep 20, 1965  Phone: [phone] (home) , Alternate phone contact: N/A  Shipping address: [address]  [address]  [place] [address]  Ship to: [address] [place] [address]  Phone or address changes today?: No  All above HIPAA information verified.  Insurance changes? No    Completed refill and clinical call assessment today to schedule patient's medication shipment from the [facility] [phone]).      MEDICATION RECONCILIATION    Confirmed the medication and dosage are correct and have not changed: Yes, regimen is correct and unchanged.    Were there any changes to your medication(s) in the past month:  No, there are no changes reported at this time.    ADHERENCE    Is this medicine transplant or covered by Medicare Part B? Yes.    Myfortic 180mg : Patient has 7 days worth of tablets on hand.  Sensipar: Patient has 7 days worth of tablets on hand.    Did you miss any doses in the past 4 weeks? No missed doses reported.  Adherence counseling provided? Not needed     SIDE EFFECT MANAGEMENT    Are you tolerating your medication?:  Lynn Erickson reports tolerating the medication.  Side effect management discussed: None      Therapy is appropriate and should be continued.    Evidence of clinical benefit: See Epic note from 03/18/18      FINANCIAL/SHIPPING    Delivery Scheduled: Yes, Expected medication delivery date: 05/08/18     Medication will be delivered via UPS to the prescription address in [facility].    Additional medications refilled: Carvedilol, Magnesium    The patient will receive a drug information handout for each medication shipped and additional FDA Medication Guides as required.      Lynn Erickson did not have any additional questions at this time.    Delivery address confirmed in Epic.     We will follow up with patient monthly for standard refill processing and delivery.      Thank you,  Tera Helper   [facility] Specialty Pharmacist

## 2018-05-07 MED FILL — MAGNESIUM OXIDE 400 MG (241.3 MG MAGNESIUM) TABLET: ORAL | 30 days supply | Qty: 30 | Fill #2

## 2018-05-07 MED FILL — MAGNESIUM OXIDE 400 MG (241.3 MG MAGNESIUM) TABLET: 30 days supply | Qty: 30 | Fill #2 | Status: AC

## 2018-05-07 MED FILL — MYFORTIC 180 MG TABLET,DELAYED RELEASE: 30 days supply | Qty: 180 | Fill #1

## 2018-05-07 MED FILL — MYFORTIC 180 MG TABLET,DELAYED RELEASE: 30 days supply | Qty: 180 | Fill #1 | Status: AC

## 2018-05-07 NOTE — Unmapped (Signed)
Lynn Erickson 's sensipar 30mg  and coreg 25mg  shipment will be delayed due to Refill too soon until 05/09/18 We have contacted the patient and communicated the delivery change to patient/caregiver We will reschedule the medication for the delivery date that the patient agreed upon. We have confirmed the delivery date as 05/12/18 .

## 2018-05-11 MED FILL — SENSIPAR 30 MG TABLET: ORAL | 30 days supply | Qty: 30 | Fill #2

## 2018-05-11 MED FILL — CARVEDILOL 25 MG TABLET: 30 days supply | Qty: 60 | Fill #2 | Status: AC

## 2018-05-11 MED FILL — SENSIPAR 30 MG TABLET: 30 days supply | Qty: 30 | Fill #2 | Status: AC

## 2018-05-11 MED FILL — CARVEDILOL 25 MG TABLET: ORAL | 30 days supply | Qty: 60 | Fill #2

## 2018-05-25 NOTE — Unmapped (Signed)
[facility] Specialty Pharmacy Refill Coordination Note  Specialty Medication(s): PROGRAF .5      NOTE: SEPARATE DELIVERY OF MAGOX 400 UPS TO RX ADDRESS TO DELIVER ON 11/26 VIA UPS (11 DAYS REMAINING)    Jodi Mourning, DOB: 09/20/1965  Phone: [phone] (home) , Alternate phone contact: N/A  Phone or address changes today?: No  All above HIPAA information was verified with patient.  Shipping Address: [address]  [address]  [place] [address]   Insurance changes? No    Completed refill call assessment today to schedule patient's medication shipment from the [facility] [phone]).      Confirmed the medication and dosage are correct and have not changed: Yes, regimen is correct and unchanged.    Confirmed patient started or stopped the following medications in the past month:  No, there are no changes reported at this time.    Are you tolerating your medication?:  Kedra reports tolerating the medication.    ADHERENCE    PROGRAF .5MG :   How many tablets were dispensed last month: 30 DAYS  Patient currently has 4 DAYS remaining.    Did you miss any doses in the past 4 weeks? No missed doses reported.    FINANCIAL/SHIPPING    Delivery Scheduled: Yes, Expected medication delivery date: 11/20     Medication will be delivered via UPS to the prescription address in [facility].    The patient will receive a drug information handout for each medication shipped and additional FDA Medication Guides as required.      Sharrie did not have any additional questions at this time.    We will follow up with patient monthly for standard refill processing and delivery.      Thank you,  Westley Gambles   [facility] Shared [facility] Specialty Technician

## 2018-05-26 MED FILL — PROGRAF 0.5 MG CAPSULE: 30 days supply | Qty: 90 | Fill #3

## 2018-05-26 MED FILL — PROGRAF 0.5 MG CAPSULE: 30 days supply | Qty: 90 | Fill #3 | Status: AC

## 2018-06-01 MED FILL — MAGNESIUM OXIDE 400 MG (241.3 MG MAGNESIUM) TABLET: 30 days supply | Qty: 30 | Fill #3 | Status: AC

## 2018-06-01 MED FILL — MAGNESIUM OXIDE 400 MG (241.3 MG MAGNESIUM) TABLET: ORAL | 30 days supply | Qty: 30 | Fill #3

## 2018-06-01 NOTE — Unmapped (Signed)
[name] Specialty Pharmacy Refill Coordination Note    Specialty Medication(s) to be Shipped:   Transplant: Myfortic 180mg   Other medication(s) to be shipped: Carvedilol 25mg  & Mag Ox 400mg   **Patient last filled Prograf 0.5mg  on 05/26/18, Denied refills at this time **     Lynn Erickson, DOB: [birth year]  Phone: [phone] (home)     All above HIPAA information was verified with patient.     Completed refill call assessment today to schedule patient's medication shipment from the [facility] [phone]).       Specialty medication(s) and dose(s) confirmed: Regimen is correct and unchanged.   Changes to medications: Taysha reports no changes reported at this time.  Changes to insurance: No  Questions for the pharmacist: No    The patient will receive a drug information handout for each medication shipped and additional FDA Medication Guides as required.      DISEASE/MEDICATION-SPECIFIC INFORMATION        N/A    ADHERENCE     Medication Adherence    Patient reported X missed doses in the last month:  0                        MEDICARE PART B DOCUMENTATION     Myfortic 180mg : Patient has 10 days worth of tablets on hand.    SHIPPING     Shipping address confirmed in Epic.     Delivery Scheduled: Yes, Expected medication delivery date: 06/10/2018 via UPS or courier.     Medication will be delivered via UPS to the home address in [facility] [place].    Orvil Faraone P Allena Katz   [facility] Shared [facility] Specialty Technician

## 2018-06-09 MED FILL — CARVEDILOL 25 MG TABLET: 30 days supply | Qty: 60 | Fill #3 | Status: AC

## 2018-06-09 MED FILL — MYFORTIC 180 MG TABLET,DELAYED RELEASE: 30 days supply | Qty: 180 | Fill #2

## 2018-06-09 MED FILL — CARVEDILOL 25 MG TABLET: ORAL | 30 days supply | Qty: 60 | Fill #3

## 2018-06-09 MED FILL — MAGNESIUM OXIDE 400 MG (241.3 MG MAGNESIUM) TABLET: ORAL | 30 days supply | Qty: 30 | Fill #4

## 2018-06-09 MED FILL — MAGNESIUM OXIDE 400 MG (241.3 MG MAGNESIUM) TABLET: 30 days supply | Qty: 30 | Fill #4 | Status: AC

## 2018-06-09 MED FILL — MYFORTIC 180 MG TABLET,DELAYED RELEASE: 30 days supply | Qty: 180 | Fill #2 | Status: AC

## 2018-06-12 ENCOUNTER — Non-Acute Institutional Stay: Admit: 2018-06-12 | Discharge: 2018-06-13 | Payer: MEDICAID

## 2018-06-12 DIAGNOSIS — Z940 Kidney transplant status: Principal | ICD-10-CM

## 2018-06-12 DIAGNOSIS — D899 Disorder involving the immune mechanism, unspecified: Secondary | ICD-10-CM

## 2018-06-12 LAB — CBC W/ AUTO DIFF
BASOPHILS RELATIVE PERCENT: 0.3 %
EOSINOPHILS ABSOLUTE COUNT: 0.2 10*9/L (ref 0.0–0.4)
EOSINOPHILS RELATIVE PERCENT: 5.4 %
HEMATOCRIT: 31.8 % — ABNORMAL LOW (ref 36.0–46.0)
HEMOGLOBIN: 9.9 g/dL — ABNORMAL LOW (ref 13.5–16.0)
LARGE UNSTAINED CELLS: 3 % (ref 0–4)
LYMPHOCYTES ABSOLUTE COUNT: 0.7 10*9/L — ABNORMAL LOW (ref 1.5–5.0)
LYMPHOCYTES RELATIVE PERCENT: 19.7 %
MEAN CORPUSCULAR HEMOGLOBIN CONC: 31.2 g/dL (ref 31.0–37.0)
MEAN CORPUSCULAR HEMOGLOBIN: 19.8 pg — ABNORMAL LOW (ref 26.0–34.0)
MEAN CORPUSCULAR VOLUME: 63.3 fL — ABNORMAL LOW (ref 80.0–100.0)
MEAN PLATELET VOLUME: 7.2 fL (ref 7.0–10.0)
MONOCYTES ABSOLUTE COUNT: 0.3 10*9/L (ref 0.2–0.8)
MONOCYTES RELATIVE PERCENT: 8.4 %
NEUTROPHILS RELATIVE PERCENT: 63.5 %
PLATELET COUNT: 162 10*9/L (ref 150–440)
RED BLOOD CELL COUNT: 5.02 10*12/L (ref 4.00–5.20)
RED CELL DISTRIBUTION WIDTH: 16.4 % — ABNORMAL HIGH (ref 12.0–15.0)
WBC ADJUSTED: 3.3 10*9/L — ABNORMAL LOW (ref 4.5–11.0)

## 2018-06-12 LAB — BASIC METABOLIC PANEL
ANION GAP: 9 mmol/L (ref 7–15)
BLOOD UREA NITROGEN: 19 mg/dL (ref 7–21)
CALCIUM: 9.3 mg/dL (ref 8.5–10.2)
CHLORIDE: 107 mmol/L (ref 98–107)
CO2: 23 mmol/L (ref 22.0–30.0)
CREATININE: 0.77 mg/dL (ref 0.60–1.00)
EGFR CKD-EPI AA FEMALE: >90 mL/min/{1.73_m2} (ref >=60)
EGFR CKD-EPI NON-AA FEMALE: 88 mL/min/{1.73_m2} (ref >=60)
POTASSIUM: 4.7 mmol/L (ref 3.5–5.0)
SODIUM: 139 mmol/L (ref 135–145)

## 2018-06-12 LAB — URINALYSIS
BACTERIA: NONE SEEN /HPF
BILIRUBIN UA: NEGATIVE
BLOOD UA: NEGATIVE
GLUCOSE UA: NEGATIVE
KETONES UA: NEGATIVE
LEUKOCYTE ESTERASE UA: NEGATIVE
NITRITE UA: NEGATIVE
PH UA: 6.5 (ref 5.0–9.0)
PROTEIN UA: NEGATIVE
RBC UA: 0 /HPF (ref <4)
SPECIFIC GRAVITY UA: 1.006 (ref 1.005–1.040)
SQUAMOUS EPITHELIAL: <1 /HPF (ref 0–5)
UROBILINOGEN UA: 0.2

## 2018-06-12 LAB — TACROLIMUS, TROUGH: Lab: 7.8

## 2018-06-12 LAB — PHOSPHORUS: Phosphate:MCnc:Pt:Ser/Plas:Qn:: 3.9

## 2018-06-12 LAB — MAGNESIUM: Magnesium:MCnc:Pt:Ser/Plas:Qn:: 1.1 — ABNORMAL LOW

## 2018-06-12 LAB — MEAN CORPUSCULAR HEMOGLOBIN CONC: Lab: 31.2

## 2018-06-12 LAB — BACTERIA: Lab: NONE SEEN

## 2018-06-12 LAB — CALCIUM: Calcium:MCnc:Pt:Ser/Plas:Qn:: 9.3

## 2018-06-16 LAB — CMV DNA, QUANTITATIVE, PCR: CMV VIRAL LD: DETECTED — AB

## 2018-06-16 LAB — CMV QUANT: Lab: <50 — ABNORMAL HIGH

## 2018-06-19 NOTE — Unmapped (Signed)
[facility] Specialty Pharmacy Refill Coordination Note    Specialty Medication(s) to be Shipped:   Transplant: Prograf 0.5mg   **Myfortic, Mag ox & Carvedilol were last sent on 06/09/18 & Patient has enough Sensipar for 19 days, no refills provided for these medications as per pt request. **     Jodi Mourning, DOB: [birth year]  Phone: [phone] (home)     All above HIPAA information was verified with patient.     Completed refill call assessment today to schedule patient's medication shipment from the [facility] [phone]).       Specialty medication(s) and dose(s) confirmed: Regimen is correct and unchanged.   Changes to medications: Bernedette reports no changes reported at this time.  Changes to insurance: No  Questions for the pharmacist: No    The patient will receive a drug information handout for each medication shipped and additional FDA Medication Guides as required.      DISEASE/MEDICATION-SPECIFIC INFORMATION        N/A    ADHERENCE     Medication Adherence    Patient reported X missed doses in the last month:  0  Specialty Medication:  Prograf 0.5mg   Patient is on additional specialty medications:  No  Patient is on more than two specialty medications:  No  Any gaps in refill history greater than 2 weeks in the last 3 months:  no  Demonstrates understanding of importance of adherence:  yes  Informant:  patient  Reliability of informant:  fairly reliable      Adherence tools used:  patient uses a pill box to manage medications          Confirmed plan for next specialty medication refill:  delivery by pharmacy          Refill Coordination    Has the Patients' Contact Information Changed:  No  Is the Shipping Address Different:  No         MEDICARE PART B DOCUMENTATION     Prograf 0.5mg : Patient has 6 days worth of capsules on hand.    SHIPPING     Shipping address confirmed in Epic.     Delivery Scheduled: Yes, Expected medication delivery date: 06/23/2018 via UPS or courier. Medication will be delivered via UPS to the home address in [facility] [place].    Domenique Quest P Allena Katz   [facility] Shared [facility] Specialty Technician

## 2018-06-22 MED FILL — PROGRAF 0.5 MG CAPSULE: 30 days supply | Qty: 90 | Fill #4 | Status: AC

## 2018-06-22 MED FILL — PROGRAF 0.5 MG CAPSULE: 30 days supply | Qty: 90 | Fill #4

## 2018-06-28 NOTE — Unmapped (Signed)
rec'd call, pt voiced flu like symptoms, feeling achy all over, runny nose, congested, watery eyes.  I advised pt go to ED or urgent care to be screened for flu. She voiced understanding and agreed with plan.

## 2018-07-09 NOTE — Unmapped (Signed)
[facility] Specialty Pharmacy Refill Coordination Note    Specialty Medication(s) to be Shipped:   Transplant: Myfortic 180mg , Prograf 0.5mg  and Sensipar 30mg   Other medication(s) to be shipped: Carvedilol 25mg      Lynn Erickson, DOB: 08/04/1965  Phone: [phone] (home)     All above HIPAA information was verified with patient.     Completed refill call assessment today to schedule patient's medication shipment from the [facility] [phone]).       Specialty medication(s) and dose(s) confirmed: Regimen is correct and unchanged.   Changes to medications: [name] reports no changes reported at this time.  Changes to insurance: No  Questions for the pharmacist: No    The patient will receive a drug information handout for each medication shipped and additional FDA Medication Guides as required.      DISEASE/MEDICATION-SPECIFIC INFORMATION        N/A    ADHERENCE     Medication Adherence    Patient reported X missed doses in the last month:  0  Specialty Medication:  Myfortic 180mg  & Prograf 0.5mg    Patient is on additional specialty medications:  No  Patient is on more than two specialty medications:  No  Any gaps in refill history greater than 2 weeks in the last 3 months:  no  Demonstrates understanding of importance of adherence:  yes  Informant:  patient  Reliability of informant:  reliable      Adherence tools used:  patient uses a pill box to manage medications          Confirmed plan for next specialty medication refill:  delivery by pharmacy          Refill Coordination    Has the Patients' Contact Information Changed:  No  Is the Shipping Address Different:  No         MEDICARE PART B DOCUMENTATION     Myfortic 180mg : Patient has 10 days worth of tablets on hand.  Prograf 0.5mg : Patient has 10 days worth of capsules on hand.  Sensipar 30mg : Patient has 10 days worth of on hand.    SHIPPING     Shipping address confirmed in Epic.     Delivery Scheduled: Yes, Expected medication delivery date: 07/17/2018 via UPS or courier.     Medication will be delivered via UPS to the home address in [employer] [place].    [name] P Allena Katz   [facility] Shared [facility] Specialty Technician

## 2018-07-16 MED FILL — CARVEDILOL 25 MG TABLET: 30 days supply | Qty: 60 | Fill #4 | Status: AC

## 2018-07-16 MED FILL — CARVEDILOL 25 MG TABLET: ORAL | 30 days supply | Qty: 60 | Fill #4

## 2018-07-16 MED FILL — PROGRAF 0.5 MG CAPSULE: 30 days supply | Qty: 90 | Fill #5 | Status: AC

## 2018-07-16 MED FILL — MYFORTIC 180 MG TABLET,DELAYED RELEASE: 30 days supply | Qty: 180 | Fill #3

## 2018-07-16 MED FILL — PROGRAF 0.5 MG CAPSULE: 30 days supply | Qty: 90 | Fill #5

## 2018-07-16 MED FILL — SENSIPAR 30 MG TABLET: 30 days supply | Qty: 30 | Fill #3 | Status: AC

## 2018-07-16 MED FILL — SENSIPAR 30 MG TABLET: ORAL | 30 days supply | Qty: 30 | Fill #3

## 2018-07-16 MED FILL — MYFORTIC 180 MG TABLET,DELAYED RELEASE: 30 days supply | Qty: 180 | Fill #3 | Status: AC

## 2018-07-17 ENCOUNTER — Encounter: Admit: 2018-07-17 | Discharge: 2018-07-18 | Payer: MEDICAID

## 2018-07-17 DIAGNOSIS — D649 Anemia, unspecified: Secondary | ICD-10-CM

## 2018-07-17 DIAGNOSIS — Z940 Kidney transplant status: Principal | ICD-10-CM

## 2018-07-17 DIAGNOSIS — D899 Disorder involving the immune mechanism, unspecified: Secondary | ICD-10-CM

## 2018-07-17 LAB — URINALYSIS
BACTERIA: NONE SEEN /HPF
BILIRUBIN UA: NEGATIVE
BLOOD UA: NEGATIVE
GLUCOSE UA: NEGATIVE
NITRITE UA: NEGATIVE
PH UA: 6 (ref 5.0–9.0)
PROTEIN UA: NEGATIVE
RBC UA: 3 /HPF (ref <4)
SQUAMOUS EPITHELIAL: 2 /HPF (ref 0–5)
UROBILINOGEN UA: 0.2
WBC UA: 15 /HPF — ABNORMAL HIGH (ref 0–5)

## 2018-07-17 LAB — SMEAR REVIEW

## 2018-07-17 LAB — CBC W/ AUTO DIFF
BASOPHILS ABSOLUTE COUNT: 0 10*9/L (ref 0.0–0.1)
BASOPHILS RELATIVE PERCENT: 0.2 %
EOSINOPHILS ABSOLUTE COUNT: 0.2 10*9/L (ref 0.0–0.4)
EOSINOPHILS RELATIVE PERCENT: 3.8 %
HEMATOCRIT: 31 % — ABNORMAL LOW (ref 36.0–46.0)
HEMOGLOBIN: 9.3 g/dL — ABNORMAL LOW (ref 13.5–16.0)
LARGE UNSTAINED CELLS: 2 % (ref 0–4)
LYMPHOCYTES ABSOLUTE COUNT: 0.7 10*9/L — ABNORMAL LOW (ref 1.5–5.0)
MEAN CORPUSCULAR HEMOGLOBIN CONC: 30.2 g/dL — ABNORMAL LOW (ref 31.0–37.0)
MEAN CORPUSCULAR HEMOGLOBIN: 19 pg — ABNORMAL LOW (ref 26.0–34.0)
MEAN CORPUSCULAR VOLUME: 62.9 fL — ABNORMAL LOW (ref 80.0–100.0)
MEAN PLATELET VOLUME: 6.6 fL — ABNORMAL LOW (ref 7.0–10.0)
MONOCYTES ABSOLUTE COUNT: 0.4 10*9/L (ref 0.2–0.8)
MONOCYTES RELATIVE PERCENT: 9.5 %
NEUTROPHILS ABSOLUTE COUNT: 2.6 10*9/L (ref 2.0–7.5)
NEUTROPHILS RELATIVE PERCENT: 67.3 %
PLATELET COUNT: 203 10*9/L (ref 150–440)
RED BLOOD CELL COUNT: 4.92 10*12/L (ref 4.00–5.20)
RED CELL DISTRIBUTION WIDTH: 15.9 % — ABNORMAL HIGH (ref 12.0–15.0)
WBC ADJUSTED: 3.9 10*9/L — ABNORMAL LOW (ref 4.5–11.0)

## 2018-07-17 LAB — BASIC METABOLIC PANEL
ANION GAP: 11 mmol/L (ref 7–15)
BLOOD UREA NITROGEN: 26 mg/dL — ABNORMAL HIGH (ref 7–21)
BUN / CREAT RATIO: 27
CALCIUM: 11.2 mg/dL — ABNORMAL HIGH (ref 8.5–10.2)
CREATININE: 0.98 mg/dL (ref 0.60–1.00)
EGFR CKD-EPI AA FEMALE: 76 mL/min/{1.73_m2} (ref >=60)
EGFR CKD-EPI NON-AA FEMALE: 66 mL/min/{1.73_m2} (ref >=60)
GLUCOSE RANDOM: 96 mg/dL (ref 70–179)
POTASSIUM: 5.2 mmol/L — ABNORMAL HIGH (ref 3.5–5.0)
SODIUM: 140 mmol/L (ref 135–145)

## 2018-07-17 LAB — SQUAMOUS EPITHELIAL: Lab: 2

## 2018-07-17 LAB — MAGNESIUM: Magnesium:MCnc:Pt:Ser/Plas:Qn:: 1.3 — ABNORMAL LOW

## 2018-07-17 LAB — FERRITIN
FERRITIN: 1390 ng/mL — ABNORMAL HIGH (ref 3.0–151.0)
Ferritin:MCnc:Pt:Ser/Plas:Qn:: 1390 — ABNORMAL HIGH

## 2018-07-17 LAB — BUN / CREAT RATIO: Urea nitrogen/Creatinine:MRto:Pt:Ser/Plas:Qn:: 27

## 2018-07-17 LAB — IRON: Iron:MCnc:Pt:Ser/Plas:Qn:: 53

## 2018-07-17 LAB — BASOPHILS ABSOLUTE COUNT: Lab: 0

## 2018-07-17 LAB — TACROLIMUS, TROUGH: Lab: 9.5

## 2018-07-17 LAB — PHOSPHORUS: Phosphate:MCnc:Pt:Ser/Plas:Qn:: 3.4

## 2018-07-20 LAB — HEMOGLOBIN/THALASSEMIA PROFILE
HEMOGLOBIN A2 QUANTITATION: 6.1 % — ABNORMAL HIGH (ref 1.5–3.5)
HEMOGLOBIN F QUANTITATION: 0.6 % (ref <=1.9)

## 2018-07-20 LAB — CMV DNA, QUANTITATIVE, PCR: CMV VIRAL LD: NOT DETECTED

## 2018-07-20 LAB — HEMOGLOBIN F QUANTITATION: Lab: 0.6

## 2018-07-20 LAB — CMV QUANT LOG10: Lab: 0

## 2018-07-21 ENCOUNTER — Encounter: Admit: 2018-07-21 | Discharge: 2018-07-21 | Payer: MEDICAID

## 2018-07-21 ENCOUNTER — Encounter: Admit: 2018-07-21 | Discharge: 2018-07-21 | Payer: MEDICAID | Attending: Nephrology | Primary: Nephrology

## 2018-07-21 DIAGNOSIS — Z940 Kidney transplant status: Principal | ICD-10-CM

## 2018-07-21 DIAGNOSIS — D899 Disorder involving the immune mechanism, unspecified: Secondary | ICD-10-CM

## 2018-07-21 DIAGNOSIS — N29 Other disorders of kidney and ureter in diseases classified elsewhere: Secondary | ICD-10-CM

## 2018-07-21 LAB — MUCUS

## 2018-07-21 LAB — URINALYSIS
BACTERIA: NONE SEEN /HPF
BILIRUBIN UA: NEGATIVE
BLOOD UA: NEGATIVE
GLUCOSE UA: NEGATIVE
KETONES UA: NEGATIVE
LEUKOCYTE ESTERASE UA: NEGATIVE
NITRITE UA: NEGATIVE
PH UA: 5.5 (ref 5.0–9.0)
PROTEIN UA: NEGATIVE
RBC UA: <1 /HPF (ref <=4)
SPECIFIC GRAVITY UA: 1.019 (ref 1.003–1.030)
SQUAMOUS EPITHELIAL: <1 /HPF (ref 0–5)
UROBILINOGEN UA: 0.2
WBC UA: 1 /HPF (ref 0–5)

## 2018-07-21 LAB — CREATININE, URINE: Lab: 141.6

## 2018-07-21 LAB — PROTEIN / CREATININE RATIO, URINE
PROTEIN URINE: 8.8 mg/dL
PROTEIN/CREAT RATIO, URINE: 0.062

## 2018-07-21 NOTE — Unmapped (Addendum)
-   we will send a prescription in for famotidine. You can take this 1-2 times a day  - please get labs in 1-2 weeks. Try to go 12 hours after taking your nightly prograf.  - continue to work on increasing your exercise and also staying hydrated  - continue to take your sensipar everyday

## 2018-07-21 NOTE — Unmapped (Signed)
[employer] transplant nephrology clinic visit    assessment and plan  1. s/p kidney transplant 10/12/2016. baseline creatinine 0.7-0.8 mg/dl. Crt slightly higher recently likely 2/2 hypercalcemia and dehydration adjusting medications as below and encouraging fluid intake. no proteinuria. no donor specific hla ab detected. no change in maintenance immunosuppression. tacrolimus 12hr lvl 6-8 ng/ml.  2. hypertension. blood pressure goal < 130/80 mmhg. Cont coreg at current dose  3. Nocturia improved. Off mybetriq with no issues  4. Anemia. Persistent for some time and iron replete. 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 No therapy needed at this time  5. Hyperparathyroidism. Has not been on sensipar for about 1 month and likely explains hypercalcemia. She will restart today and repeat labs in 1 week checking BMP and PTH.  6. preventive medicine. influenza '19. pcv13 pneumococcal '17. ppsv23 pneumococcal '19. zoster '19. nl mammogram '19. nl ob/gyn exam '19. colonoscopy '16 with 5 year follow up recommended. renal ultrasound '19. -> wanted to wait on shingrix until next visit    history of present illness    [name] is a 53 year old woman seen in follow up post kidney transplant 10/12/2016. Feeling well currently. Had viral like illness in December with fevers, chills, muscle aches, and GI upset but most of these symptoms have resolved. Still has some bloating but ran out of famotidine. She\generally well w/ no fever, chills, SOB, cough, n/v/d/c. No LEE. Nocturia has improved. No difficulties urinating and stopped mybetriq. She has not taken sensipar in about a month because she thought she didn't need to when her appetite was poor.    past medical hx:  1. 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 baseline creatinine 0.7-0.8 mg/dl.  2. systemic lupus erythematosus   3. hypertension  4. secondary/tertiary hyperparathyroidism    past surgical hx: left upper ext av graft x2 '07-08. bilateral tubal ligation. kidney transplant '18. left upper ext avg ligation '19.  ??  allergies: penicillin, vancomycin, aspirin, shellfish, hydroxychloroquine.  ??  medications: tacrolimus 1mg /0.5mg  am/pm, mycophenolate sodium 540mg  bid, carvedilol 25mg  bid, cinacalcet 30mg  daily, mg oxide 400mg  daily, famotidine 20mg  prn.   ??  soc hx: married x4 children. no smoking hx     physical exam: t36 p76 bp124/76 wt66.6kg bmi 34.1. wd/wn woman nl/appropriate affect and mood. nl sclera anicteric. mmm no thrush. neck supple no palpable ln. heart rrr nl s1s2 no m/r/g. lungs clear bilateral. abd soft nt/nd. no lower ext edema. msk no synovitis/tophi. skin no rash. neuro alert oriented non focal exam.    labs 07/17/17: wbc3.9 hgb9.3 hct31.0 plts203. na140 k5.2 cl108 bicarb21 bun26 cr0.98 glc96 ca11.2 mg1.3 phos3.4. cmv pcr not detected. tacrolimus 12hr lvl 9.5 ng/ml. urine protein not detected.

## 2018-08-05 NOTE — Unmapped (Signed)
[facility] Specialty Pharmacy Refill and Clinical Coordination Note  Medication(s): myfortic 180, prograf 0.5  Pt declines sensipar fill at this time- says she has over a month supply on hand and will get it with next refill call out. Aware to call us if 7 days or less of any medication.    Lynn Erickson, DOB: [birth year]  Phone: [phone] (home) , Alternate phone contact: N/A  Shipping address: [address] [name] [address]  Phone or address changes today?: No  All above HIPAA information verified.  Insurance changes? No    Completed refill and clinical call assessment today to schedule patient's medication shipment from the [facility] [phone]).      MEDICATION RECONCILIATION    Confirmed the medication and dosage are correct and have not changed: Yes, regimen is correct and unchanged.    Were there any changes to your medication(s) in the past month:  Yes. Lynn Erickson reports starting the following medications: restarted famotidine - requesting rx today    ADHERENCE    Is this medicine transplant or covered by Medicare Part B? Yes.    Myfortic 180mg : Patient has 12 days worth of tablets on hand.  Prograf 0.5mg : Patient has 12 days worth of capsules on hand.  sensipar 30mg : Patient has over 1 month worth  on hand.    Did you miss any doses in the past 4 weeks? No missed doses reported.  Adherence counseling provided? Not needed     SIDE EFFECT MANAGEMENT    Are you tolerating your medication?:  Lynn Erickson reports tolerating the medication.  Side effect management discussed: None      Therapy is appropriate and should be continued.    Evidence of clinical benefit: Do you feel that that the medication is helping? Yes      FINANCIAL/SHIPPING    Delivery Scheduled: Yes, Expected medication delivery date: 08/12/2018.  However, Rx request for refills was sent to the provider as there are none remaining.     Medication will be delivered via UPS to the prescription address in [facility].    Additional medications refilled: coreg, magnesium, famotidine (requesting rx today) - 5rx total. pt declines sensipar - requesting new rx to place on profile for future fills of it with note do not fill sensipar when new rx received in request, work order comments, and pharmacy comments in wam    The patient will receive a drug information handout for each medication shipped and additional FDA Medication Guides as required.      Lynn Erickson did not have any additional questions at this time.    Delivery address confirmed in Epic.     We will follow up with patient monthly for standard refill processing and delivery.      Thank you,  Thad Ranger   [facility] Shared [facility] Specialty Pharmacist

## 2018-08-06 MED ORDER — FAMOTIDINE 20 MG TABLET
ORAL_TABLET | 11 refills | 0 days | Status: CP
Start: 2018-08-06 — End: 2019-03-03
  Filled 2018-08-11: qty 60, 30d supply, fill #0

## 2018-08-06 MED ORDER — SENSIPAR 30 MG TABLET
ORAL_TABLET | ORAL | 11 refills | 0 days | Status: CP
Start: 2018-08-06 — End: 2018-08-10

## 2018-08-10 MED ORDER — CINACALCET 30 MG TABLET
ORAL_TABLET | Freq: Every day | ORAL | 3 refills | 0 days | Status: CP
Start: 2018-08-10 — End: 2018-08-11
  Filled 2018-09-03: qty 30, 30d supply, fill #0

## 2018-08-11 MED ORDER — CINACALCET 30 MG TABLET
ORAL_TABLET | Freq: Every day | ORAL | 3 refills | 90.00000 days | Status: CP
Start: 2018-08-11 — End: 2019-08-11

## 2018-08-11 MED FILL — FAMOTIDINE 20 MG TABLET: 30 days supply | Qty: 60 | Fill #0 | Status: AC

## 2018-08-11 MED FILL — CARVEDILOL 25 MG TABLET: ORAL | 30 days supply | Qty: 60 | Fill #5

## 2018-08-11 MED FILL — PROGRAF 0.5 MG CAPSULE: 30 days supply | Qty: 90 | Fill #6 | Status: AC

## 2018-08-11 MED FILL — MYFORTIC 180 MG TABLET,DELAYED RELEASE: 30 days supply | Qty: 180 | Fill #4

## 2018-08-11 MED FILL — MAGNESIUM OXIDE 400 MG (241.3 MG MAGNESIUM) TABLET: ORAL | 30 days supply | Qty: 30 | Fill #5

## 2018-08-11 MED FILL — MAGNESIUM OXIDE 400 MG (241.3 MG MAGNESIUM) TABLET: 30 days supply | Qty: 30 | Fill #5 | Status: AC

## 2018-08-11 MED FILL — CARVEDILOL 25 MG TABLET: 30 days supply | Qty: 60 | Fill #5 | Status: AC

## 2018-08-11 MED FILL — PROGRAF 0.5 MG CAPSULE: 30 days supply | Qty: 90 | Fill #6

## 2018-08-11 MED FILL — MYFORTIC 180 MG TABLET,DELAYED RELEASE: 30 days supply | Qty: 180 | Fill #4 | Status: AC

## 2018-08-12 NOTE — Unmapped (Signed)
CINACALCET 30 MG TABLET   TEST CLAIM $1.30     PA WAS DENIED FOR [place]

## 2018-08-14 NOTE — Unmapped (Signed)
Spoke with patient today regarding sensipar.  She verified she still has about 4 weeks supply on hand (confirmed from our discussion last week).  She is aware that due to insurance rejecting PA on brand sensipar, her next fills will be for cinacalcet (generic sensipar).

## 2018-09-01 NOTE — Unmapped (Signed)
l/m for pat. to c/b to sch appt

## 2018-09-02 NOTE — Unmapped (Signed)
[facility] Specialty Pharmacy Refill Coordination Note    Specialty Medication(s) to be Shipped:   Transplant: Myfortic 180mg , Prograf 0.5mg  and Cinacalcet 30mg     Other medication(s) to be shipped:   Mag OX 400mg   Famotidine 20mg   Carvedilol 25mg        Lynn Erickson, DOB: January 13, 1966  Phone: [phone] (home)       All above HIPAA information was verified with patient.     Completed refill call assessment today to schedule patient's medication shipment from the [facility] [phone]).       Specialty medication(s) and dose(s) confirmed: Regimen is correct and unchanged.   Changes to medications: Seraya reports no changes reported at this time.  Changes to insurance: No  Questions for the pharmacist: No    The patient will receive a drug information handout for each medication shipped and additional FDA Medication Guides as required.      DISEASE/MEDICATION-SPECIFIC INFORMATION        N/A    ADHERENCE     Medication Adherence    Patient reported X missed doses in the last month:  0  Adherence tools used:  patient uses a pill box to manage medications              MEDICARE PART B DOCUMENTATION     Myfortic 180mg : Patient has 7 days worth of tablets on hand.  Prograf 0.5mg : Patient has 7 days worth of capsules on hand.  Cinacalcet 30mg : Patient has 7 days worth of   on hand.    SHIPPING     Shipping address confirmed in Epic.     Delivery Scheduled: Yes, Expected medication delivery date: 09/04/18 via UPS or courier.     Medication will be delivered via UPS to the home address in [facility] [place].    [place] A Kouper Spinella   [facility] Shared [facility] Specialty Technician

## 2018-09-03 MED FILL — PROGRAF 0.5 MG CAPSULE: 30 days supply | Qty: 90 | Fill #7

## 2018-09-03 MED FILL — CARVEDILOL 25 MG TABLET: 30 days supply | Qty: 60 | Fill #6 | Status: AC

## 2018-09-03 MED FILL — PROGRAF 0.5 MG CAPSULE: 30 days supply | Qty: 90 | Fill #7 | Status: AC

## 2018-09-03 MED FILL — MAGNESIUM OXIDE 400 MG (241.3 MG MAGNESIUM) TABLET: ORAL | 30 days supply | Qty: 30 | Fill #6

## 2018-09-03 MED FILL — CARVEDILOL 25 MG TABLET: ORAL | 30 days supply | Qty: 60 | Fill #6

## 2018-09-03 MED FILL — CINACALCET 30 MG TABLET: 30 days supply | Qty: 30 | Fill #0 | Status: AC

## 2018-09-03 MED FILL — MAGNESIUM OXIDE 400 MG (241.3 MG MAGNESIUM) TABLET: 30 days supply | Qty: 30 | Fill #6 | Status: AC

## 2018-09-03 MED FILL — MYFORTIC 180 MG TABLET,DELAYED RELEASE: 30 days supply | Qty: 180 | Fill #5 | Status: AC

## 2018-09-03 MED FILL — MYFORTIC 180 MG TABLET,DELAYED RELEASE: 30 days supply | Qty: 180 | Fill #5

## 2018-09-03 NOTE — Unmapped (Signed)
Insurance will only cover a 30 day supply on Sensipar. I called the patient and she is aware.

## 2018-09-17 DIAGNOSIS — Z0000 Encounter for general adult medical examination without abnormal findings: Principal | ICD-10-CM

## 2018-09-17 DIAGNOSIS — Z940 Kidney transplant status: Principal | ICD-10-CM

## 2018-09-22 ENCOUNTER — Encounter: Admit: 2018-09-22 | Discharge: 2018-09-23 | Payer: MEDICAID

## 2018-09-22 DIAGNOSIS — Z940 Kidney transplant status: Principal | ICD-10-CM

## 2018-09-22 DIAGNOSIS — Z0000 Encounter for general adult medical examination without abnormal findings: Principal | ICD-10-CM

## 2018-09-22 LAB — URINALYSIS
BILIRUBIN UA: NEGATIVE
BLOOD UA: NEGATIVE
GLUCOSE UA: NEGATIVE
KETONES UA: NEGATIVE
NITRITE UA: NEGATIVE
PH UA: 6 (ref 5.0–9.0)
PROTEIN UA: NEGATIVE
SPECIFIC GRAVITY UA: 1.01 (ref 1.005–1.040)
SQUAMOUS EPITHELIAL: <1 /HPF (ref 0–5)
UROBILINOGEN UA: 0.2
WBC UA: 6 /HPF — ABNORMAL HIGH (ref 0–5)

## 2018-09-22 LAB — COMPREHENSIVE METABOLIC PANEL
ALBUMIN: 4.4 g/dL (ref 3.5–5.0)
ALKALINE PHOSPHATASE: 130 U/L — ABNORMAL HIGH (ref 38–126)
ALT (SGPT): 19 U/L (ref <35)
ANION GAP: 12 mmol/L (ref 7–15)
AST (SGOT): 26 U/L (ref 14–38)
BILIRUBIN TOTAL: 0.4 mg/dL (ref 0.0–1.2)
BLOOD UREA NITROGEN: 20 mg/dL (ref 7–21)
BUN / CREAT RATIO: 25
CALCIUM: 9.7 mg/dL (ref 8.5–10.2)
CHLORIDE: 110 mmol/L — ABNORMAL HIGH (ref 98–107)
CO2: 21 mmol/L — ABNORMAL LOW (ref 22.0–30.0)
CREATININE: 0.8 mg/dL (ref 0.60–1.00)
EGFR CKD-EPI AA FEMALE: >90 mL/min/{1.73_m2} (ref >=60)
EGFR CKD-EPI NON-AA FEMALE: 84 mL/min/{1.73_m2} (ref >=60)
POTASSIUM: 4.6 mmol/L (ref 3.5–5.0)
PROTEIN TOTAL: 7.5 g/dL (ref 6.5–8.3)
SODIUM: 143 mmol/L (ref 135–145)
SODIUM: 143 mmol/L — ABNORMAL HIGH (ref 135–145)

## 2018-09-22 LAB — SLIDE REVIEW

## 2018-09-22 LAB — CBC W/ AUTO DIFF
BASOPHILS ABSOLUTE COUNT: 0 10*9/L (ref 0.0–0.1)
BASOPHILS RELATIVE PERCENT: 0.3 %
EOSINOPHILS ABSOLUTE COUNT: 0.2 10*9/L (ref 0.0–0.4)
EOSINOPHILS RELATIVE PERCENT: 7.8 %
HEMATOCRIT: 33.1 % — ABNORMAL LOW (ref 36.0–46.0)
HEMOGLOBIN: 9.9 g/dL — ABNORMAL LOW (ref 13.5–16.0)
LARGE UNSTAINED CELLS: 7 % — ABNORMAL HIGH (ref 0–4)
LYMPHOCYTES ABSOLUTE COUNT: 0.5 10*9/L — ABNORMAL LOW (ref 1.5–5.0)
MEAN CORPUSCULAR HEMOGLOBIN CONC: 29.8 g/dL — ABNORMAL LOW (ref 31.0–37.0)
MEAN CORPUSCULAR HEMOGLOBIN: 19.2 pg — ABNORMAL LOW (ref 26.0–34.0)
MEAN CORPUSCULAR VOLUME: 64.5 fL — ABNORMAL LOW (ref 80.0–100.0)
MONOCYTES RELATIVE PERCENT: 10.4 %
NEUTROPHILS ABSOLUTE COUNT: 1.5 10*9/L — ABNORMAL LOW (ref 2.0–7.5)
NEUTROPHILS RELATIVE PERCENT: 57.2 %
PLATELET COUNT: 155 10*9/L (ref 150–440)
RED BLOOD CELL COUNT: 5.14 10*12/L (ref 4.00–5.20)
RED CELL DISTRIBUTION WIDTH: 16.6 % — ABNORMAL HIGH (ref 12.0–15.0)
RED CELL DISTRIBUTION WIDTH: 16.6 % — ABNORMAL HIGH (ref 12.0–15.0)
WBC ADJUSTED: 2.6 10*9/L — ABNORMAL LOW (ref 4.5–11.0)

## 2018-09-22 LAB — PROTEIN / CREATININE RATIO, URINE: PROTEIN URINE: <4 mg/dL

## 2018-09-23 LAB — CMV DNA, QUANTITATIVE, PCR
CMV QUANT: <50 [IU]/mL — ABNORMAL HIGH (ref <0)
CMV VIRAL LD: DETECTED — AB

## 2018-09-23 LAB — BK VIRUS QUANTITATIVE PCR, BLOOD
BK BLOOD LOG(10): NOT DETECTED
BK BLOOD RESULT: NOT DETECTED

## 2018-09-28 NOTE — Unmapped (Signed)
[facility] Specialty Pharmacy Refill Coordination Note    Specialty Medication(s) to be Shipped:   Transplant: Myfortic 180mg , Prograf 0.5mg  and Sensipar 30mg   Other medication(s) to be shipped: Carvedilol 25mg , Mag Ox 400mg  & Famotidine 20mg      Lynn Erickson, DOB: [birth year]  Phone: [phone] (home)     All above HIPAA information was verified with patient.     Completed refill call assessment today to schedule patient's medication shipment from the [facility] [phone]).       Specialty medication(s) and dose(s) confirmed: Regimen is correct and unchanged.   Changes to medications: Lynn Erickson reports no changes reported at this time.  Changes to insurance: No  Questions for the pharmacist: No    Confirmed patient received Welcome Packet with first shipment. The patient will receive a drug information handout for each medication shipped and additional FDA Medication Guides as required.       DISEASE/MEDICATION-SPECIFIC INFORMATION        N/A    SPECIALTY MEDICATION ADHERENCE     Medication Adherence    Patient reported X missed doses in the last month:  0  Specialty Medication:  Myfortic 180mg    Patient is on additional specialty medications:  Yes  Additional Specialty Medications:  Prograf 0.5mg   Patient Reported Additional Medication X Missed Doses in the Last Month:  0  Patient is on more than two specialty medications:  No  Any gaps in refill history greater than 2 weeks in the last 3 months:  no  Demonstrates understanding of importance of adherence:  yes  Adherence tools used:  patient uses a pill box to manage medications        Myfortic 180 mg: 10 days of medicine on hand   Prograf 0.5 mg: 10 days of medicine on hand   Sensipar 30 mg: 10 days of medicine on hand     SHIPPING     Shipping address confirmed in Epic.     Delivery Scheduled: Yes, Expected medication delivery date: 10/07/2018.     Medication will be delivered via UPS to the home address in [employer] [place].    Raiquan Chandler P Allena Katz   [facility] Shared [facility] Specialty Technician

## 2018-10-07 MED FILL — PROGRAF 0.5 MG CAPSULE: 30 days supply | Qty: 90 | Fill #8

## 2018-10-07 MED FILL — PROGRAF 0.5 MG CAPSULE: 30 days supply | Qty: 90 | Fill #8 | Status: AC

## 2018-10-07 MED FILL — MAGNESIUM OXIDE 400 MG (241.3 MG MAGNESIUM) TABLET: ORAL | 30 days supply | Qty: 30 | Fill #7

## 2018-10-07 MED FILL — MYFORTIC 180 MG TABLET,DELAYED RELEASE: 30 days supply | Qty: 180 | Fill #6 | Status: AC

## 2018-10-07 MED FILL — MAGNESIUM OXIDE 400 MG (241.3 MG MAGNESIUM) TABLET: 30 days supply | Qty: 30 | Fill #7 | Status: AC

## 2018-10-07 MED FILL — CINACALCET 30 MG TABLET: 30 days supply | Qty: 30 | Fill #1 | Status: AC

## 2018-10-07 MED FILL — CINACALCET 30 MG TABLET: ORAL | 30 days supply | Qty: 30 | Fill #1

## 2018-10-07 MED FILL — CARVEDILOL 25 MG TABLET: 30 days supply | Qty: 60 | Fill #7 | Status: AC

## 2018-10-07 MED FILL — MYFORTIC 180 MG TABLET,DELAYED RELEASE: 30 days supply | Qty: 180 | Fill #6

## 2018-10-07 MED FILL — CARVEDILOL 25 MG TABLET: ORAL | 30 days supply | Qty: 60 | Fill #7

## 2018-10-28 NOTE — Unmapped (Signed)
Patient does not need a refill of specialty medication at this time. Moving specialty refill reminder call to appropriate date and removed call attempt data.  Spoke with patient and she states she still has medication to last her for 2 weeks (Myfortic, Prograf and Sensipar) and does not need any refills at this time. Confirmed with patient and she has not missed any doses or started any new medications.

## 2018-11-04 ENCOUNTER — Encounter: Admit: 2018-11-04 | Discharge: 2018-11-04 | Payer: MEDICAID

## 2018-11-04 ENCOUNTER — Encounter: Admit: 2018-11-04 | Discharge: 2018-11-04 | Payer: MEDICAID | Attending: Nephrology | Primary: Nephrology

## 2018-11-04 DIAGNOSIS — Z940 Kidney transplant status: Secondary | ICD-10-CM

## 2018-11-04 DIAGNOSIS — D899 Disorder involving the immune mechanism, unspecified: Secondary | ICD-10-CM

## 2018-11-04 DIAGNOSIS — I10 Essential (primary) hypertension: Secondary | ICD-10-CM

## 2018-11-04 DIAGNOSIS — Z0000 Encounter for general adult medical examination without abnormal findings: Secondary | ICD-10-CM

## 2018-11-04 LAB — CBC W/ AUTO DIFF
BASOPHILS ABSOLUTE COUNT: 0 10*9/L (ref 0.0–0.1)
BASOPHILS RELATIVE PERCENT: 0.4 %
EOSINOPHILS ABSOLUTE COUNT: 0.3 10*9/L (ref 0.0–0.4)
EOSINOPHILS RELATIVE PERCENT: 8 %
HEMATOCRIT: 32.6 % — ABNORMAL LOW (ref 36.0–46.0)
HEMOGLOBIN: 9.9 g/dL — ABNORMAL LOW (ref 13.5–16.0)
LARGE UNSTAINED CELLS: 3 % (ref 0–4)
LYMPHOCYTES ABSOLUTE COUNT: 0.7 10*9/L — ABNORMAL LOW (ref 1.5–5.0)
LYMPHOCYTES RELATIVE PERCENT: 21 %
MEAN CORPUSCULAR HEMOGLOBIN CONC: 30.3 g/dL — ABNORMAL LOW (ref 31.0–37.0)
MEAN CORPUSCULAR HEMOGLOBIN: 19.3 pg — ABNORMAL LOW (ref 26.0–34.0)
MEAN PLATELET VOLUME: 7.8 fL (ref 7.0–10.0)
MONOCYTES ABSOLUTE COUNT: 0.3 10*9/L (ref 0.2–0.8)
MONOCYTES RELATIVE PERCENT: 8.1 %
NEUTROPHILS ABSOLUTE COUNT: 1.8 10*9/L — ABNORMAL LOW (ref 2.0–7.5)
NEUTROPHILS RELATIVE PERCENT: 59 %
PLATELET COUNT: 161 10*9/L (ref 150–440)
RED BLOOD CELL COUNT: 5.11 10*12/L (ref 4.00–5.20)
RED CELL DISTRIBUTION WIDTH: 15.7 % — ABNORMAL HIGH (ref 12.0–15.0)
WBC ADJUSTED: 3.1 10*9/L — ABNORMAL LOW (ref 4.5–11.0)

## 2018-11-04 LAB — URINALYSIS
BACTERIA: NONE SEEN /HPF
BILIRUBIN UA: NEGATIVE
BLOOD UA: NEGATIVE
GLUCOSE UA: NEGATIVE
KETONES UA: NEGATIVE
NITRITE UA: NEGATIVE
PH UA: 5.5 (ref 5.0–9.0)
PROTEIN UA: NEGATIVE
RBC UA: 0 /HPF (ref <4)
RENAL TUBULAR EPITHELIAL CELLS: 3 /HPF — ABNORMAL HIGH
SPECIFIC GRAVITY UA: 1.01 (ref 1.005–1.040)
TRANSITIONAL EPITHELIAL: 3 /HPF — ABNORMAL HIGH (ref 0–2)
UROBILINOGEN UA: 0.2

## 2018-11-04 LAB — PARATHYROID HOMONE (PTH): PARATHYROID HORMONE INTACT: 134.5 pg/mL — ABNORMAL HIGH (ref 12.0–72.0)

## 2018-11-04 LAB — COMPREHENSIVE METABOLIC PANEL
ALBUMIN: 4.5 g/dL (ref 3.5–5.0)
ALKALINE PHOSPHATASE: 122 U/L (ref 38–126)
ALT (SGPT): 15 U/L (ref <35)
ANION GAP: 13 mmol/L (ref 7–15)
AST (SGOT): 25 U/L (ref 14–38)
BILIRUBIN TOTAL: 0.5 mg/dL (ref 0.0–1.2)
BLOOD UREA NITROGEN: 23 mg/dL — ABNORMAL HIGH (ref 7–21)
BUN / CREAT RATIO: 25
CALCIUM: 9.6 mg/dL (ref 8.5–10.2)
CHLORIDE: 108 mmol/L — ABNORMAL HIGH (ref 98–107)
CREATININE: 0.92 mg/dL (ref 0.60–1.00)
EGFR CKD-EPI AA FEMALE: 82 mL/min/{1.73_m2} (ref >=60)
EGFR CKD-EPI NON-AA FEMALE: 71 mL/min/{1.73_m2} (ref >=60)
GLUCOSE RANDOM: 95 mg/dL (ref 70–179)
POTASSIUM: 4.7 mmol/L (ref 3.5–5.0)
PROTEIN TOTAL: 7.1 g/dL (ref 6.5–8.3)
PROTEIN TOTAL: 7.1 g/dL — ABNORMAL HIGH (ref 6.5–8.3)

## 2018-11-04 LAB — SLIDE REVIEW

## 2018-11-04 LAB — PHOSPHORUS: PHOSPHORUS: 4.1 mg/dL (ref 2.9–4.7)

## 2018-11-04 LAB — PROTEIN / CREATININE RATIO, URINE: CREATININE, URINE: 62.5 mg/dL

## 2018-11-04 NOTE — Unmapped (Signed)
Reviewed medication and discuss concerns pre-telemedicine visit. Pt report pain, and . Blood pressure and heart rate today  106/68 HR 82 Denies,fever  HA, Dizziness, Nausea/Vomitting/Diarrhea/constipation. Denies SOB, palpitation, chest pain, edema and pain or burning when urinating.

## 2018-11-05 LAB — CMV DNA, QUANTITATIVE, PCR

## 2018-11-05 NOTE — Unmapped (Signed)
[facility] Specialty Pharmacy Refill Coordination Note    Specialty Medication(s) to be Shipped:   Transplant: Myfortic 180mg , Prograf 0.5mg  and Sensipar 30mg     Other medication(s) to be shipped: magnesium and coreg      Patient decline refill of pepcid.    Lynn Erickson, DOB: 09-23-1965  Phone: [phone] (home) [phone] (work)      All above HIPAA information was verified with patient.     Completed refill call assessment today to schedule patient's medication shipment from the [facility] [phone]).       Specialty medication(s) and dose(s) confirmed: Regimen is correct and unchanged.   Changes to medications: Lynn Erickson reports no changes at this time.  Changes to insurance: No  Questions for the pharmacist: No    Confirmed patient received Welcome Packet with first shipment. The patient will receive a drug information handout for each medication shipped and additional FDA Medication Guides as required.       DISEASE/MEDICATION-SPECIFIC INFORMATION        N/A    SPECIALTY MEDICATION ADHERENCE     Medication Adherence    Patient reported X missed doses in the last month:  0  Specialty Medication:  prograf 0.5mg   Patient is on additional specialty medications:  Yes  Additional Specialty Medications:  Myfortic 180mg   Patient Reported Additional Medication X Missed Doses in the Last Month:  0  Patient is on more than two specialty medications:  Yes  Specialty Medication:  Sensipar  Patient Reported Additional Medication X Missed Doses in the Last Month:  0  Adherence tools used:  patient uses a pill box to manage medications          Refill Coordination    Has the Patients' Contact Information Changed:  No  Is the Shipping Address Different:  No           MYFORTIC 180 mg: 6 days of medicine on hand   PROGRAF 0.5 mg: 6 days of medicine on hand   SENISPAR 30 mg: 6 days of medicine on hand          SHIPPING     Shipping address confirmed in Epic.     Delivery Scheduled: Yes, Expected medication delivery date: 11/10/18.     Medication will be delivered via UPS to the prescription address in Epic [facility].    Unk Lightning   [facility] Specialty Technician

## 2018-11-08 LAB — BK BLOOD QUANT: Lab: 0

## 2018-11-08 LAB — BK VIRUS QUANTITATIVE PCR, BLOOD

## 2018-11-09 MED FILL — CARVEDILOL 25 MG TABLET: ORAL | 30 days supply | Qty: 60 | Fill #8

## 2018-11-09 MED FILL — MAGNESIUM OXIDE 400 MG (241.3 MG MAGNESIUM) TABLET: 30 days supply | Qty: 30 | Fill #8 | Status: AC

## 2018-11-09 MED FILL — MAGNESIUM OXIDE 400 MG (241.3 MG MAGNESIUM) TABLET: ORAL | 30 days supply | Qty: 30 | Fill #8

## 2018-11-09 MED FILL — PROGRAF 0.5 MG CAPSULE: 30 days supply | Qty: 90 | Fill #9 | Status: AC

## 2018-11-09 MED FILL — CINACALCET 30 MG TABLET: ORAL | 30 days supply | Qty: 30 | Fill #2

## 2018-11-09 MED FILL — MYFORTIC 180 MG TABLET,DELAYED RELEASE: 30 days supply | Qty: 180 | Fill #7

## 2018-11-09 MED FILL — CARVEDILOL 25 MG TABLET: 30 days supply | Qty: 60 | Fill #8 | Status: AC

## 2018-11-09 MED FILL — CINACALCET 30 MG TABLET: 30 days supply | Qty: 30 | Fill #2 | Status: AC

## 2018-11-09 MED FILL — MYFORTIC 180 MG TABLET,DELAYED RELEASE: 30 days supply | Qty: 180 | Fill #7 | Status: AC

## 2018-11-09 MED FILL — PROGRAF 0.5 MG CAPSULE: 30 days supply | Qty: 90 | Fill #9

## 2018-12-11 MED ORDER — CARVEDILOL 25 MG TABLET
ORAL_TABLET | Freq: Two times a day (BID) | ORAL | 11 refills | 30 days | Status: CP
Start: 2018-12-11 — End: 2019-03-03
  Filled 2018-12-15: qty 60, 30d supply, fill #0

## 2018-12-11 NOTE — Unmapped (Signed)
[facility] Refill Coordination Note    Specialty Medication(s) to be Shipped:   Transplant: Myfortic 180mg , Prograf 0.5mg  and Sensipar 30mg   Other medication(s) to be shipped: Carvedilol 25mg  & Mag Ox 400mg      Lynn Erickson, DOB: [birth year]  Phone: [phone] (home) [phone] (work)    All above HIPAA information was verified with patient.     Completed refill call assessment today to schedule patient's medication shipment from the [facility] [phone]).       Specialty medication(s) and dose(s) confirmed: Regimen is correct and unchanged.   Changes to medications: Lynn Erickson reports no changes reported at this time.  Changes to insurance: No  Questions for the pharmacist: No    Confirmed patient received Welcome Packet with first shipment. The patient will receive a drug information handout for each medication shipped and additional FDA Medication Guides as required.       DISEASE/MEDICATION-SPECIFIC INFORMATION        N/A    SPECIALTY MEDICATION ADHERENCE     Medication Adherence    Patient reported X missed doses in the last month:  0  Specialty Medication:  Cinacalcet 30mg   Patient is on additional specialty medications:  Yes  Additional Specialty Medications:  Myfortic 180mg   Patient Reported Additional Medication X Missed Doses in the Last Month:  0  Patient is on more than two specialty medications:  Yes  Specialty Medication:  Prograf 0.5mg   Patient Reported Additional Medication X Missed Doses in the Last Month:  0  Adherence tools used:  patient uses a pill box to manage medications        Myfortic 180 mg: 9 days of medicine on hand   Prograf 0.5 mg: 9 days of medicine on hand   Sensipar 60 mg: 9 days of medicine on hand     SHIPPING     Shipping address confirmed in Epic.     Delivery Scheduled: Yes, Expected medication delivery date: 12/16/2018.     Medication will be delivered via UPS to the home address in Epic [place].    Lynn Erickson P Allena Katz   [facility] Specialty Technician

## 2018-12-15 MED FILL — MYFORTIC 180 MG TABLET,DELAYED RELEASE: 30 days supply | Qty: 180 | Fill #8 | Status: AC

## 2018-12-15 MED FILL — MAGNESIUM OXIDE 400 MG (241.3 MG MAGNESIUM) TABLET: 30 days supply | Qty: 30 | Fill #9 | Status: AC

## 2018-12-15 MED FILL — CINACALCET 30 MG TABLET: ORAL | 30 days supply | Qty: 30 | Fill #3

## 2018-12-15 MED FILL — PROGRAF 0.5 MG CAPSULE: 30 days supply | Qty: 90 | Fill #10 | Status: AC

## 2018-12-15 MED FILL — MAGNESIUM OXIDE 400 MG (241.3 MG MAGNESIUM) TABLET: ORAL | 30 days supply | Qty: 30 | Fill #9

## 2018-12-15 MED FILL — CARVEDILOL 25 MG TABLET: 30 days supply | Qty: 60 | Fill #0 | Status: AC

## 2018-12-15 MED FILL — MYFORTIC 180 MG TABLET,DELAYED RELEASE: 30 days supply | Qty: 180 | Fill #8

## 2018-12-15 MED FILL — CINACALCET 30 MG TABLET: 30 days supply | Qty: 30 | Fill #3 | Status: AC

## 2018-12-15 MED FILL — PROGRAF 0.5 MG CAPSULE: 30 days supply | Qty: 90 | Fill #10

## 2019-01-05 NOTE — Unmapped (Addendum)
Spoke to Lynn Erickson about changing Prograf to generic tac next month.  Pt. Acknowledged understanding.      [facility] Clinical Assessment & Refill Coordination Note    Lynn Erickson, DOB: 1965-10-22  Phone: [phone] (home) [phone] (work)    All above HIPAA information was verified with patient.     Specialty Medication(s):   Transplant: Myfortic 180mg , Prograf 0.5mg  and Cinacalcet 30mg      Current Outpatient Medications   Medication Sig Dispense Refill   ??? carvediloL (COREG) 25 MG tablet Take 1 tablet (25 mg total) by mouth Two (2) times a day. 60 tablet 11   ??? cinacalcet (SENSIPAR) 30 MG tablet Take 1 tablet (30 mg total) by mouth daily. 90 tablet 3   ??? famotidine (PEPCID) 20 MG tablet TAKE 1 TABLET BY MOUTH TWICE DAILY AS NEEDED FOR HEARTBURN 60 tablet 11   ??? magnesium oxide (MAG-OX) 400 mg (241.3 mg magnesium) tablet TAKE 1 TABLET BY MOUTH ONCE DAILY IN THE MORNING 30 tablet 11   ??? MYFORTIC 180 mg EC tablet TAKE 3 TABLETS (540 MG) BY MOUTH TWICE DAILY 180 tablet 11   ??? PROGRAF 0.5 mg capsule Take 2 capsules by mouth in the morning, and take 1 capsule in the evening. 90 capsule 11     No current facility-administered medications for this visit.         Changes to medications: Sumaiya reports no changes at this time.    Allergies   Allergen Reactions   ??? Plaquenil [Hydroxychloroquine] Other (See Comments)     Toxic maculopathy   ??? Shellfish Containing Products Anaphylaxis   ??? Bleomycin    ??? Aspirin Rash   ??? Penicillins Rash     skin testing 12/13/2016 negative, oral challenging pending for 03/15/2017   ??? 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       Changes to allergies: No    SPECIALTY MEDICATION ADHERENCE     Prograf 0.5 mg: 13 days of medicine on hand   Myfortic 180 mg: 13 days of medicine on hand   Cinacalcet 30 mg: 13 days of medicine on hand       Medication Adherence    Patient reported X missed doses in the last month:  0  Specialty Medication:  Prograf 0.5  Patient is on additional specialty medications:  Yes  Additional Specialty Medications:  Myfortic 180mg   Patient Reported Additional Medication X Missed Doses in the Last Month:  0  Patient is on more than two specialty medications:  Yes  Specialty Medication:  Cinacalcet 30mg   Patient Reported Additional Medication X Missed Doses in the Last Month:  0  Adherence tools used:  patient uses a pill box to manage medications          Specialty medication(s) dose(s) confirmed: Regimen is correct and unchanged.     Are there any concerns with adherence? No    Adherence counseling provided? Not needed    CLINICAL MANAGEMENT AND INTERVENTION      Clinical Benefit Assessment:    Do you feel the medicine is effective or helping your condition? Yes    Clinical Benefit counseling provided? Not needed    Adverse Effects Assessment:    Are you experiencing any side effects? No    Are you experiencing difficulty administering your medicine? No    Quality of Life Assessment:    How many days over the past month did your kidney  transplant  keep you from your normal activities? For example, brushing your teeth or getting up in the morning. 0    Have you discussed this with your provider? Not needed    Therapy Appropriateness:    Is therapy appropriate? Yes, therapy is appropriate and should be continued    DISEASE/MEDICATION-SPECIFIC INFORMATION      N/A    PATIENT SPECIFIC NEEDS     ? Does the patient have any physical, cognitive, or cultural barriers? No    ? Is the patient high risk? Yes, patient taking a REMS drug     ? Does the patient require a Care Management Plan? No     ? Does the patient require physician intervention or other additional services (i.e. nutrition, smoking cessation, social work)? No      SHIPPING     Specialty Medication(s) to be Shipped:   Transplant: Myfortic 180mg , tacrolimus 0.5mg  and Cinacalcet 30mg     Other medication(s) to be shipped: Carvedilol, Mg     Changes to insurance: No    Delivery Scheduled: Yes, Expected medication delivery date: 01/15/2019.     Medication will be delivered via UPS to the confirmed prescription address in [facility].    The patient will receive a drug information handout for each medication shipped and additional FDA Medication Guides as required.  Verified that patient has previously received a Conservation officer, historic buildings.    All of the patient's questions and concerns have been addressed.    Tera Helper   [facility] Specialty Pharmacist

## 2019-01-08 MED ORDER — TACROLIMUS 0.5 MG CAPSULE
ORAL_CAPSULE | 11 refills | 0 days | Status: CP
Start: 2019-01-08 — End: ?
  Filled 2019-01-14: qty 90, 30d supply, fill #0

## 2019-01-14 MED FILL — TACROLIMUS 0.5 MG CAPSULE: 30 days supply | Qty: 90 | Fill #0 | Status: AC

## 2019-01-14 MED FILL — MAGNESIUM OXIDE 400 MG (241.3 MG MAGNESIUM) TABLET: 30 days supply | Qty: 30 | Fill #10 | Status: AC

## 2019-01-14 MED FILL — CINACALCET 30 MG TABLET: 30 days supply | Qty: 30 | Fill #4 | Status: AC

## 2019-01-14 MED FILL — CINACALCET 30 MG TABLET: ORAL | 30 days supply | Qty: 30 | Fill #4

## 2019-01-14 MED FILL — MAGNESIUM OXIDE 400 MG (241.3 MG MAGNESIUM) TABLET: ORAL | 30 days supply | Qty: 30 | Fill #10

## 2019-01-14 MED FILL — MYFORTIC 180 MG TABLET,DELAYED RELEASE: 30 days supply | Qty: 180 | Fill #9

## 2019-01-14 MED FILL — MYFORTIC 180 MG TABLET,DELAYED RELEASE: 30 days supply | Qty: 180 | Fill #9 | Status: AC

## 2019-01-14 MED FILL — CARVEDILOL 25 MG TABLET: ORAL | 30 days supply | Qty: 60 | Fill #1

## 2019-01-14 MED FILL — CARVEDILOL 25 MG TABLET: 30 days supply | Qty: 60 | Fill #1 | Status: AC

## 2019-01-28 ENCOUNTER — Encounter: Admit: 2019-01-28 | Discharge: 2019-01-29 | Payer: MEDICAID

## 2019-01-28 DIAGNOSIS — Z0000 Encounter for general adult medical examination without abnormal findings: Secondary | ICD-10-CM

## 2019-01-28 DIAGNOSIS — Z940 Kidney transplant status: Principal | ICD-10-CM

## 2019-01-28 LAB — URINALYSIS
BILIRUBIN UA: NEGATIVE
BLOOD UA: NEGATIVE
GLUCOSE UA: NEGATIVE
KETONES UA: NEGATIVE
PH UA: 6 (ref 5.0–9.0)
PROTEIN UA: NEGATIVE
RBC UA: <1 /HPF (ref <4)
SPECIFIC GRAVITY UA: 1.009 (ref 1.005–1.040)
SQUAMOUS EPITHELIAL: 3 /HPF (ref 0–5)
UROBILINOGEN UA: 0.2
WBC UA: 6 /HPF — ABNORMAL HIGH (ref 0–5)

## 2019-01-28 LAB — COMPREHENSIVE METABOLIC PANEL
ALBUMIN: 4.2 g/dL (ref 3.5–5.0)
ALKALINE PHOSPHATASE: 117 U/L (ref 38–126)
ALT (SGPT): 16 U/L (ref <35)
ANION GAP: 7 mmol/L (ref 7–15)
AST (SGOT): 25 U/L (ref 14–38)
BILIRUBIN TOTAL: 0.5 mg/dL (ref 0.0–1.2)
BLOOD UREA NITROGEN: 29 mg/dL — ABNORMAL HIGH (ref 7–21)
BUN / CREAT RATIO: 30
CALCIUM: 9.7 mg/dL (ref 8.5–10.2)
CHLORIDE: 111 mmol/L — ABNORMAL HIGH (ref 98–107)
CREATININE: 0.96 mg/dL (ref 0.60–1.00)
EGFR CKD-EPI AA FEMALE: 78 mL/min/{1.73_m2} (ref >=60)
EGFR CKD-EPI NON-AA FEMALE: 67 mL/min/{1.73_m2} (ref >=60)
GLUCOSE RANDOM: 96 mg/dL (ref 70–179)
POTASSIUM: 4.3 mmol/L (ref 3.5–5.0)
SODIUM: 140 mmol/L (ref 135–145)

## 2019-01-28 LAB — TACROLIMUS, TROUGH: Lab: 8.9

## 2019-01-28 LAB — CBC W/ AUTO DIFF
BASOPHILS ABSOLUTE COUNT: 0 10*9/L (ref 0.0–0.1)
BASOPHILS RELATIVE PERCENT: 0.5 %
EOSINOPHILS ABSOLUTE COUNT: 0.3 10*9/L (ref 0.0–0.4)
EOSINOPHILS RELATIVE PERCENT: 6.4 %
HEMOGLOBIN: 10.1 g/dL — ABNORMAL LOW (ref 13.5–16.0)
LARGE UNSTAINED CELLS: 4 % (ref 0–4)
LYMPHOCYTES ABSOLUTE COUNT: 1 10*9/L — ABNORMAL LOW (ref 1.5–5.0)
LYMPHOCYTES RELATIVE PERCENT: 25 %
MEAN CORPUSCULAR HEMOGLOBIN: 20.1 pg — ABNORMAL LOW (ref 26.0–34.0)
MEAN CORPUSCULAR VOLUME: 64.1 fL — ABNORMAL LOW (ref 80.0–100.0)
MONOCYTES ABSOLUTE COUNT: 0.4 10*9/L (ref 0.2–0.8)
MONOCYTES RELATIVE PERCENT: 9.6 %
NEUTROPHILS ABSOLUTE COUNT: 2.2 10*9/L (ref 2.0–7.5)
NEUTROPHILS RELATIVE PERCENT: 55 %
PLATELET COUNT: 145 10*9/L — ABNORMAL LOW (ref 150–440)
RED BLOOD CELL COUNT: 5.01 10*12/L (ref 4.00–5.20)
RED CELL DISTRIBUTION WIDTH: 16.8 % — ABNORMAL HIGH (ref 12.0–15.0)
WBC ADJUSTED: 4.1 10*9/L — ABNORMAL LOW (ref 4.5–11.0)

## 2019-01-28 LAB — BUN / CREAT RATIO: Urea nitrogen/Creatinine:MRto:Pt:Ser/Plas:Qn:: 30

## 2019-01-28 LAB — PROTEIN URINE: Protein:MCnc:Pt:Urine:Qn:: <4

## 2019-01-28 LAB — PROTEIN / CREATININE RATIO, URINE

## 2019-01-28 LAB — PROTEIN UA: Protein:MCnc:Pt:Urine:Qn:Test strip: NEGATIVE

## 2019-01-28 LAB — MAGNESIUM: Magnesium:MCnc:Pt:Ser/Plas:Qn:: 1.4 — ABNORMAL LOW

## 2019-01-28 LAB — PHOSPHORUS: Phosphate:MCnc:Pt:Ser/Plas:Qn:: 3.8

## 2019-01-28 LAB — MONOCYTES ABSOLUTE COUNT: Lab: 0.4

## 2019-01-30 LAB — CMV QUANT LOG10: Lab: 0

## 2019-01-30 LAB — CMV DNA, QUANTITATIVE, PCR: CMV VIRAL LD: NOT DETECTED

## 2019-02-01 NOTE — Unmapped (Signed)
left message, called to change 8/12 appt time to 8:30

## 2019-02-02 LAB — BK BLOOD COMMENT: Lab: 0

## 2019-02-02 LAB — BK VIRUS QUANTITATIVE PCR, BLOOD

## 2019-02-08 NOTE — Unmapped (Signed)
[facility] Clinical Assessment & Refill Coordination Note    Lynn Erickson, DOB: 05/14/1966  Phone: [phone] (home) [phone] (work)    All above HIPAA information was verified with patient.     Specialty Medication(s):   Transplant: Myfortic 180mg  and tacrolimus 0.5mg  and cinacalcet 30mg        [facility] Pharmacist Intervention    Type of intervention: change from brand to generic of NTI drug    Medication: prograf/tac    Problem: clinic ok'ed change to generic    Intervention: patient was switched to generic on last fill per wam notes - she said did well no complaints on generic    Follow up needed: n/a    Approximate time spent: 10 minutes    Lynn Erickson   [facility] Specialty Pharmacist        Current Outpatient Medications   Medication Sig Dispense Refill   ??? carvediloL (COREG) 25 MG tablet Take 1 tablet (25 mg total) by mouth Two (2) times a day. 60 tablet 11   ??? cinacalcet (SENSIPAR) 30 MG tablet Take 1 tablet (30 mg total) by mouth daily. 90 tablet 3   ??? famotidine (PEPCID) 20 MG tablet TAKE 1 TABLET BY MOUTH TWICE DAILY AS NEEDED FOR HEARTBURN 60 tablet 11   ??? magnesium oxide (MAG-OX) 400 mg (241.3 mg magnesium) tablet TAKE 1 TABLET BY MOUTH ONCE DAILY IN THE MORNING 30 tablet 11   ??? MYFORTIC 180 mg EC tablet TAKE 3 TABLETS (540 MG) BY MOUTH TWICE DAILY 180 tablet 11   ??? tacrolimus (PROGRAF) 0.5 MG capsule Take 2 capsules (1mg ) by mouth in the morning and 1 capsule (0.5mg ) in the evening. 90 capsule 11     No current facility-administered medications for this visit.         Changes to medications: Lynn Erickson reports no changes at this time.    Allergies   Allergen Reactions   ??? Plaquenil [Hydroxychloroquine] Other (See Comments)     Toxic maculopathy   ??? Shellfish Containing Products Anaphylaxis   ??? Bleomycin    ??? Aspirin Rash   ??? Penicillins Rash     skin testing 12/13/2016 negative, oral challenging pending for 03/15/2017   ??? 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       Changes to allergies: No    SPECIALTY MEDICATION ADHERENCE     Tacrolimus 0.5mg   : 10 days of medicine on hand   Myfortic 180mg   : 10 days of medicine on hand   cinacalcet 30mg   : 10 days of medicine on hand       Medication Adherence    Patient reported X missed doses in the last month: 0  Specialty Medication: cinacalcet 30mg   Patient is on additional specialty medications: Yes  Additional Specialty Medications: Myfortic 180mg   Patient Reported Additional Medication X Missed Doses in the Last Month: 0  Patient is on more than two specialty medications: Yes  Specialty Medication: tacrolimus 0.5mg   Patient Reported Additional Medication X Missed Doses in the Last Month: 0  Adherence tools used: patient uses a pill box to manage medications          Specialty medication(s) dose(s) confirmed: Regimen is correct and unchanged.     Are there any concerns with adherence? No    Adherence counseling provided? Not needed    CLINICAL MANAGEMENT AND INTERVENTION  Clinical Benefit Assessment:    Do you feel the medicine is effective or helping your condition? Yes    Clinical Benefit counseling provided? Not needed    Adverse Effects Assessment:    Are you experiencing any side effects? No    Are you experiencing difficulty administering your medicine? No    Quality of Life Assessment:    How many days over the past month did your transplant  keep you from your normal activities? For example, brushing your teeth or getting up in the morning. 0    Have you discussed this with your provider? Not needed    Therapy Appropriateness:    Is therapy appropriate? Yes, therapy is appropriate and should be continued    DISEASE/MEDICATION-SPECIFIC INFORMATION      N/A    PATIENT SPECIFIC NEEDS     ? Does the patient have any physical, cognitive, or cultural barriers? No    ? Is the patient high risk? Yes, patient taking a REMS drug     ? Does the patient require a Care Management Plan? No     ? Does the patient require physician intervention or other additional services (i.e. nutrition, smoking cessation, social work)? No      SHIPPING     Specialty Medication(s) to be Shipped:   Transplant: Myfortic 180mg , tacrolimus 0.5mg  and cinacalcet 30mg     Other medication(s) to be shipped: magox, coreg - 5rx total     Changes to insurance: No    Delivery Scheduled: Yes, Expected medication delivery date: 02/12/2019.  However, Rx request for refills was sent to the provider as there are none remaining.     Medication will be delivered via UPS to the confirmed prescription address in [facility].    The patient will receive a drug information handout for each medication shipped and additional FDA Medication Guides as required.  Verified that patient has previously received a Conservation officer, historic buildings.    All of the patient's questions and concerns have been addressed.    Lynn Erickson   [facility] Specialty Pharmacist

## 2019-02-09 MED ORDER — MAGNESIUM OXIDE 400 MG (241.3 MG MAGNESIUM) TABLET
ORAL_TABLET | Freq: Every morning | ORAL | 11 refills | 30 days | Status: CP
Start: 2019-02-09 — End: 2020-02-09
  Filled 2019-02-11: qty 30, 30d supply, fill #0

## 2019-02-11 MED FILL — TACROLIMUS 0.5 MG CAPSULE: 30 days supply | Qty: 90 | Fill #1 | Status: AC

## 2019-02-11 MED FILL — CARVEDILOL 25 MG TABLET: 30 days supply | Qty: 60 | Fill #2 | Status: AC

## 2019-02-11 MED FILL — CINACALCET 30 MG TABLET: 30 days supply | Qty: 30 | Fill #5 | Status: AC

## 2019-02-11 MED FILL — TACROLIMUS 0.5 MG CAPSULE, IMMEDIATE-RELEASE: 30 days supply | Qty: 90 | Fill #1

## 2019-02-11 MED FILL — MYFORTIC 180 MG TABLET,DELAYED RELEASE: 30 days supply | Qty: 180 | Fill #10

## 2019-02-11 MED FILL — MYFORTIC 180 MG TABLET,DELAYED RELEASE: 30 days supply | Qty: 180 | Fill #10 | Status: AC

## 2019-02-11 MED FILL — MAGNESIUM OXIDE 400 MG (241.3 MG MAGNESIUM) TABLET: 30 days supply | Qty: 30 | Fill #0 | Status: AC

## 2019-02-11 MED FILL — CARVEDILOL 25 MG TABLET: ORAL | 30 days supply | Qty: 60 | Fill #2

## 2019-02-11 MED FILL — CINACALCET 30 MG TABLET: ORAL | 30 days supply | Qty: 30 | Fill #5

## 2019-03-02 ENCOUNTER — Encounter: Admit: 2019-03-02 | Discharge: 2019-03-03 | Payer: MEDICAID

## 2019-03-02 DIAGNOSIS — N189 Chronic kidney disease, unspecified: Secondary | ICD-10-CM

## 2019-03-02 DIAGNOSIS — Z940 Kidney transplant status: Principal | ICD-10-CM

## 2019-03-02 DIAGNOSIS — Z0000 Encounter for general adult medical examination without abnormal findings: Secondary | ICD-10-CM

## 2019-03-02 DIAGNOSIS — D631 Anemia in chronic kidney disease: Secondary | ICD-10-CM

## 2019-03-02 LAB — URINALYSIS
BILIRUBIN UA: NEGATIVE
BLOOD UA: NEGATIVE
GLUCOSE UA: NEGATIVE
KETONES UA: NEGATIVE
LEUKOCYTE ESTERASE UA: NEGATIVE
NITRITE UA: NEGATIVE
PROTEIN UA: NEGATIVE
RBC UA: 3 /HPF (ref <4)
SPECIFIC GRAVITY UA: 1.01 (ref 1.005–1.040)
SQUAMOUS EPITHELIAL: 1 /HPF (ref 0–5)
UROBILINOGEN UA: 0.2
WBC UA: 4 /HPF (ref 0–5)

## 2019-03-02 LAB — COMPREHENSIVE METABOLIC PANEL
ALBUMIN: 4.3 g/dL (ref 3.5–5.0)
ALKALINE PHOSPHATASE: 103 U/L (ref 38–126)
ANION GAP: 7 mmol/L (ref 7–15)
AST (SGOT): 25 U/L (ref 14–38)
BILIRUBIN TOTAL: 0.6 mg/dL (ref 0.0–1.2)
BLOOD UREA NITROGEN: 20 mg/dL (ref 7–21)
BUN / CREAT RATIO: 24
CALCIUM: 9.4 mg/dL (ref 8.5–10.2)
CHLORIDE: 112 mmol/L — ABNORMAL HIGH (ref 98–107)
CO2: 21 mmol/L — ABNORMAL LOW (ref 22.0–30.0)
CREATININE: 0.85 mg/dL (ref 0.60–1.00)
EGFR CKD-EPI NON-AA FEMALE: 78 mL/min/{1.73_m2} (ref >=60)
GLUCOSE RANDOM: 97 mg/dL (ref 70–179)
POTASSIUM: 4.6 mmol/L (ref 3.5–5.0)
PROTEIN TOTAL: 6.9 g/dL (ref 6.5–8.3)
SODIUM: 140 mmol/L (ref 135–145)

## 2019-03-02 LAB — SLIDE REVIEW

## 2019-03-02 LAB — CBC W/ AUTO DIFF
BASOPHILS ABSOLUTE COUNT: 0 10*9/L (ref 0.0–0.1)
BASOPHILS RELATIVE PERCENT: 0.1 %
EOSINOPHILS ABSOLUTE COUNT: 0.4 10*9/L (ref 0.0–0.4)
EOSINOPHILS RELATIVE PERCENT: 12.8 %
HEMATOCRIT: 31.1 % — ABNORMAL LOW (ref 36.0–46.0)
LARGE UNSTAINED CELLS: 5 % — ABNORMAL HIGH (ref 0–4)
MEAN CORPUSCULAR HEMOGLOBIN CONC: 29.7 g/dL — ABNORMAL LOW (ref 31.0–37.0)
MEAN CORPUSCULAR HEMOGLOBIN: 19 pg — ABNORMAL LOW (ref 26.0–34.0)
MEAN CORPUSCULAR VOLUME: 63.9 fL — ABNORMAL LOW (ref 80.0–100.0)
MEAN PLATELET VOLUME: 7.2 fL (ref 7.0–10.0)
MONOCYTES ABSOLUTE COUNT: 0.4 10*9/L (ref 0.2–0.8)
MONOCYTES RELATIVE PERCENT: 11.7 %
NEUTROPHILS ABSOLUTE COUNT: 1.8 10*9/L — ABNORMAL LOW (ref 2.0–7.5)
NEUTROPHILS RELATIVE PERCENT: 55 %
PLATELET COUNT: 145 10*9/L — ABNORMAL LOW (ref 150–440)
RED BLOOD CELL COUNT: 4.87 10*12/L (ref 4.00–5.20)
RED CELL DISTRIBUTION WIDTH: 16.7 % — ABNORMAL HIGH (ref 12.0–15.0)
WBC ADJUSTED: 3.3 10*9/L — ABNORMAL LOW (ref 4.5–11.0)

## 2019-03-02 LAB — MAGNESIUM: Magnesium:MCnc:Pt:Ser/Plas:Qn:: 1.4 — ABNORMAL LOW

## 2019-03-02 LAB — TACROLIMUS, TROUGH: Lab: 6.9

## 2019-03-02 LAB — PROTEIN / CREATININE RATIO, URINE
CREATININE, URINE: 91.4 mg/dL
PROTEIN URINE: <4 mg/dL

## 2019-03-02 LAB — PH UA: Lab: 6

## 2019-03-02 LAB — ALKALINE PHOSPHATASE: Alkaline phosphatase:CCnc:Pt:Ser/Plas:Qn:: 103

## 2019-03-02 LAB — PROTEIN/CREAT RATIO, URINE: Protein/Creatinine:MRto:Pt:Urine:Qn:: 0

## 2019-03-02 LAB — WBC ADJUSTED: Lab: 3.3 — ABNORMAL LOW

## 2019-03-02 LAB — PHOSPHORUS: Phosphate:MCnc:Pt:Ser/Plas:Qn:: 3.8

## 2019-03-02 LAB — OVALOCYTES

## 2019-03-02 NOTE — Unmapped (Signed)
[employer] transplant nephrology telemedicine visit    assessment and plan  1. s/p kidney transplant 10/12/2016. baseline creatinine 0.7-0.8 mg/dl. no proteinuria. no donor specific hla ab detected.   2. immunosuppression. mycophenolate sodium 540mg  bid. tacrolimus 12hr lvl 6-8 ng/ml.  3. hypertension. blood pressure goal < 130/80 mmhg.   4. preventive medicine. influenza '19. pcv13 pneumococcal '17. ppsv23 pneumococcal '19. zoster '19 with booster recommended. mammogram '19. gyn exam/pap smear '19. colonoscopy '16 with 5 year follow up recommended. renal ultrasound '20.    I spent 15 minutes on the phone with the patient. I spent an additional 15 minutes on pre- and post-visit activities.     The patient was physically located in [place] or a state in which I am permitted to provide care. The patient and/or parent/guardian understood that s/he may incur co-pays and cost sharing, and agreed to the telemedicine visit. The visit was reasonable and appropriate under the circumstances given the patient's presentation at the time.    The patient and/or parent/guardian has been advised of the potential risks and limitations of this mode of treatment (including, but not limited to, the absence of in-person examination) and has agreed to be treated using telemedicine. The patient's/patient's family's questions regarding telemedicine have been answered.     If the visit was completed in an ambulatory setting, the patient and/or parent/guardian has also been advised to contact their provider???s office for worsening conditions, and seek emergency medical treatment and/or call 911 if the patient deems either necessary.    history of present illness    [name] is a 53 year old woman seen in follow up post kidney transplant 10/12/2016. she feels well. no fever chills or sweats. no headache or lightheaded. no chest pain cough orthopnea or shortness of breath. no lower extremity edema. no abdominal pain. +mild occ nausea no vomiting or diarrhea. appetite nl. weight stable. no dysuria or difficulty voiding. blood pressure 120s/70s. all other systems reviewed and negative x10 systems.    past medical hx:  1. 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 baseline creatinine 0.7-0.8 mg/dl.  2. systemic lupus erythematosus   3. hypertension  4. secondary/tertiary hyperparathyroidism  5. diastolic lv dysfunction. nl lvef 60-65% on echocardiogram '18.    past surgical hx: left upper ext av graft x2 '07-08. bilateral tubal ligation. kidney transplant '18. left upper ext avg ligation '19.  ??  allergies: penicillin, vancomycin, aspirin, shellfish, hydroxychloroquine.  ??  medications: tacrolimus 1mg /0.5mg  am/pm, mycophenolate sodium 540mg  bid, carvedilol 25mg  bid, cinacalcet 30mg  daily, mg oxide 400mg  daily, famotidine 20mg  prn.   ??  soc hx: married x4 children. no smoking hx     labs 11/04/18: wbc3.1 hgb9.9 hct32.6 plts161. na144 k4.7 cl108 bicarb23 bun23 cr0.9 glc95 ca9.6 mg1.3 phos4.1 albumin4.5 liver function panel nl. pth 134. cmv/bkv pcr not detected. tacrolimus 12hr lvl 7.3 ng/ml. urine protein not detected.

## 2019-03-03 ENCOUNTER — Telehealth: Admit: 2019-03-03 | Discharge: 2019-03-04 | Payer: MEDICAID | Attending: Nephrology | Primary: Nephrology

## 2019-03-03 LAB — IRON PANEL
IRON SATURATION (CALC): 49 % (ref 15–50)
TRANSFERRIN: 211 mg/dL (ref 200.0–380.0)

## 2019-03-03 LAB — FERRITIN: Ferritin:MCnc:Pt:Ser/Plas:Qn:: 1480 — ABNORMAL HIGH

## 2019-03-03 LAB — TOTAL IRON BINDING CAPACITY (CALC): Lab: 265.9

## 2019-03-03 MED ORDER — CHLORTHALIDONE 25 MG TABLET
ORAL_TABLET | Freq: Every morning | ORAL | 11 refills | 30.00000 days | Status: CP
Start: 2019-03-03 — End: 2020-03-02
  Filled 2019-03-09: qty 30, 30d supply, fill #0

## 2019-03-04 LAB — CMV DNA, QUANTITATIVE, PCR: CMV VIRAL LD: DETECTED — AB

## 2019-03-04 LAB — CMV QUANT: Lab: <50 — ABNORMAL HIGH

## 2019-03-04 NOTE — Unmapped (Signed)
[employer] transplant nephrology telemedicine visit    assessment and plan  1. s/p kidney transplant 10/12/2016. baseline creatinine 0.7-0.8 mg/dl. no proteinuria. no donor specific hla ab detected.   2. immunosuppression. mycophenolate sodium 540mg  bid. tacrolimus 12hr lvl 5-8 ng/ml.  3. hypertension. chlorthalidone 25mg  initiated in place of carvedilol d/t edema. blood pressure goal < 130/80 mmhg.   4. fatigue dyspnea with exertion. +dobutamine stress echocardiogram.  5. preventive medicine. influenza '19. pcv13 pneumococcal '17. ppsv23 pneumococcal '19. zoster '19 with booster recommended. mammogram '19. gyn exam/pap smear '19. colonoscopy '16 with 5 year follow up recommended. renal ultrasound '20.    I spent 25 minutes on the phone with the patient. I spent an additional 15 minutes on pre- and post-visit activities.     The patient was physically located in [place] or a state in which I am permitted to provide care. The patient and/or parent/guardian understood that s/he may incur co-pays and cost sharing, and agreed to the telemedicine visit. The visit was reasonable and appropriate under the circumstances given the patient's presentation at the time.    The patient and/or parent/guardian has been advised of the potential risks and limitations of this mode of treatment (including, but not limited to, the absence of in-person examination) and has agreed to be treated using telemedicine. The patient's/patient's family's questions regarding telemedicine have been answered.     If the visit was completed in an ambulatory setting, the patient and/or parent/guardian has also been advised to contact their provider???s office for worsening conditions, and seek emergency medical treatment and/or call 911 if the patient deems either necessary.    history of present illness    [name] is a 54 year old woman seen in follow up post kidney transplant 10/12/2016. +fatigue. +palpitations and dyspnea with exertion. +occ dependent edema. no orthopnea. she feels better post av graft ligation. no fever chills or sweats. no headache or lightheaded. appetite nl. no abdominal pain no n/v/d. no dysuria or difficulty voiding. all other systems reviewed and negative x10 systems.    past medical hx:  1. 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 baseline creatinine 0.7-0.8 mg/dl.  2. systemic lupus erythematosus   3. hypertension  4. secondary/tertiary hyperparathyroidism  5. diastolic lv dysfunction. nl lvef 60-65% echocardiogram '18.    past surgical hx: left upper ext av graft x2 '07-08. bilateral tubal ligation. kidney transplant '18. left upper ext avg ligation '19.  ??  allergies: penicillin, vancomycin, aspirin, shellfish, hydroxychloroquine.  ??  medications: tacrolimus 1mg /0.5mg  am/pm, mycophenolate sodium 540mg  bid, carvedilol 25mg  bid, cinacalcet 30mg  daily, mg oxide 400mg  daily, famotidine 20mg  prn.     soc hx: married x4 children. no smoking hx     labs 03/02/19: wbc3.3 hgb9.2 hct31.1 plts145. na140 k4.6 cl112 bicarb21 bun20 cr0.85 glc97 ca9.4 mg1.4 phos3.8 albumin4.3 liver function panel nl. cmv pcr < 50. tacrolimus lvl 6.9 ng/ml. urine protein not detected.

## 2019-03-05 NOTE — Unmapped (Signed)
[facility] Pharm[facility] Refill Coordination Note    Specialty Medication(s) to be Shipped:   Transplant: Myfortic 180mg , tacrolimus 0.5mg  and Sensipar 30mg     Other medication(s) to be shipped:   Chlorthalidone 25mg   Mag Ox     Lynn Erickson, DOB: 05-13-1966  Phone: [phone] (home) [phone] (work)      All above HIPAA information was verified with patient's family member.     Completed refill call assessment today to schedule patient's medication shipment from the [facility] [phone]).       Specialty medication(s) and dose(s) confirmed: Regimen is correct and unchanged.   Changes to medications: Lynn Erickson reports no changes at this time.  Changes to insurance: No  Questions for the pharmacist: No    Confirmed patient received Welcome Packet with first shipment. The patient will receive a drug information handout for each medication shipped and additional FDA Medication Guides as required.       DISEASE/MEDICATION-SPECIFIC INFORMATION        N/A    SPECIALTY MEDICATION ADHERENCE     Medication Adherence    Patient reported X missed doses in the last month: 0  Adherence tools used: patient uses a pill box to manage medications            Myfortic 180 mg: 9 days of medicine on hand   Tacrolimus 0.5 mg: 9 days of medicine on hand   Sensipar 60 mg: 9 days of medicine on hand             SHIPPING     Shipping address confirmed in Epic.     Delivery Scheduled: Yes, Expected medication delivery date: 03/09/19.     Medication will be delivered via UPS to the home address in [place].    Lynn Erickson   [facility] Specialty Technician

## 2019-03-09 MED FILL — TACROLIMUS 0.5 MG CAPSULE, IMMEDIATE-RELEASE: 30 days supply | Qty: 90 | Fill #2

## 2019-03-09 MED FILL — MAGNESIUM OXIDE 400 MG (241.3 MG MAGNESIUM) TABLET: 30 days supply | Qty: 30 | Fill #1 | Status: AC

## 2019-03-09 MED FILL — TACROLIMUS 0.5 MG CAPSULE: 30 days supply | Qty: 90 | Fill #2 | Status: AC

## 2019-03-09 MED FILL — CINACALCET 30 MG TABLET: 30 days supply | Qty: 30 | Fill #6 | Status: AC

## 2019-03-09 MED FILL — CHLORTHALIDONE 25 MG TABLET: 30 days supply | Qty: 30 | Fill #0 | Status: AC

## 2019-03-09 MED FILL — MYFORTIC 180 MG TABLET,DELAYED RELEASE: 30 days supply | Qty: 180 | Fill #11

## 2019-03-09 MED FILL — MAGNESIUM OXIDE 400 MG (241.3 MG MAGNESIUM) TABLET: ORAL | 30 days supply | Qty: 30 | Fill #1

## 2019-03-09 MED FILL — CINACALCET 30 MG TABLET: ORAL | 30 days supply | Qty: 30 | Fill #6

## 2019-03-09 MED FILL — MYFORTIC 180 MG TABLET,DELAYED RELEASE: 30 days supply | Qty: 180 | Fill #11 | Status: AC

## 2019-03-11 DIAGNOSIS — R0609 Other forms of dyspnea: Secondary | ICD-10-CM

## 2019-03-12 LAB — BK VIRUS QUANTITATIVE PCR, BLOOD

## 2019-03-12 LAB — BK BLOOD COMMENT: Lab: 0

## 2019-04-05 DIAGNOSIS — Z940 Kidney transplant status: Secondary | ICD-10-CM

## 2019-04-05 MED ORDER — MYFORTIC 180 MG TABLET,DELAYED RELEASE
ORAL_TABLET | Freq: Two times a day (BID) | ORAL | 11 refills | 30.00000 days | Status: CP
Start: 2019-04-05 — End: 2020-04-04
  Filled 2019-04-08: qty 180, 30d supply, fill #0

## 2019-04-05 NOTE — Unmapped (Signed)
[facility] Pharm[facility] Refill Coordination Note    Specialty Medication(s) to be Shipped:   Transplant: Myfortic 180mg , tacrolimus 0.5mg  and Cinacalcet 30mg    **Refill request sent for Myfortic**    Other medication(s) to be shipped: Chlorthiladone and Mag Ox     Lynn Erickson, DOB: Dec 23, 1965  Phone: [phone] (home) [phone] (work)      All above HIPAA information was verified with patient.     Completed refill call assessment today to schedule patient's medication shipment from the [facility] [phone]).       Specialty medication(s) and dose(s) confirmed: Regimen is correct and unchanged.   Changes to medications: [name] reports no changes at this time.  Changes to insurance: No  Questions for the pharmacist: No    Confirmed patient received Welcome Packet with first shipment. The patient will receive a drug information handout for each medication shipped and additional FDA Medication Guides as required.       DISEASE/MEDICATION-SPECIFIC INFORMATION        N/A    SPECIALTY MEDICATION ADHERENCE     Medication Adherence    Patient reported X missed doses in the last month: 0  Specialty Medication: cinacalcet 30 MG tablet (SENSIPAR)  Patient is on additional specialty medications: Yes  Additional Specialty Medications: MYFORTIC 180 MG EC tablet (mycophenolate)  Patient Reported Additional Medication X Missed Doses in the Last Month: 0  Patient is on more than two specialty medications: Yes  Specialty Medication: tacrolimus 0.5 MG capsule (PROGRAF)  Patient Reported Additional Medication X Missed Doses in the Last Month: 0  Adherence tools used: patient uses a pill box to manage medications          Cinacalcet  30 mg: 7 days of medicine on hand   Myfortic 180 mg: 7 days of medicine on hand   Tacrolimus 0.5 mg: 7 days of medicine on hand     SHIPPING     Shipping address confirmed in Epic.     Delivery Scheduled: Yes, Expected medication delivery date: 04/09/2019.  However, Rx request for refills was sent to the provider as there are none remaining. (Myfortic)     Medication will be delivered via UPS to the home address in [employer] [place].    Oretha Milch   [facility] Specialty Technician

## 2019-04-06 ENCOUNTER — Encounter
Admit: 2019-04-06 | Discharge: 2019-04-06 | Disposition: A | Discharge status: Home / Self Care | Payer: MEDICAID | Attending: Nephrology

## 2019-04-06 DIAGNOSIS — R0600 Dyspnea, unspecified: Secondary | ICD-10-CM

## 2019-04-06 DIAGNOSIS — R0609 Other forms of dyspnea: Secondary | ICD-10-CM

## 2019-04-08 MED FILL — CHLORTHALIDONE 25 MG TABLET: ORAL | 30 days supply | Qty: 30 | Fill #1

## 2019-04-08 MED FILL — CHLORTHALIDONE 25 MG TABLET: 30 days supply | Qty: 30 | Fill #1 | Status: AC

## 2019-04-08 MED FILL — MAGNESIUM OXIDE 400 MG (241.3 MG MAGNESIUM) TABLET: ORAL | 30 days supply | Qty: 30 | Fill #2

## 2019-04-08 MED FILL — MYFORTIC 180 MG TABLET,DELAYED RELEASE: 30 days supply | Qty: 180 | Fill #0 | Status: AC

## 2019-04-08 MED FILL — CINACALCET 30 MG TABLET: ORAL | 30 days supply | Qty: 30 | Fill #7

## 2019-04-08 MED FILL — CINACALCET 30 MG TABLET: 30 days supply | Qty: 30 | Fill #7 | Status: AC

## 2019-04-08 MED FILL — MAGNESIUM OXIDE 400 MG (241.3 MG MAGNESIUM) TABLET: 30 days supply | Qty: 30 | Fill #2 | Status: AC

## 2019-04-08 MED FILL — TACROLIMUS 0.5 MG CAPSULE: 30 days supply | Qty: 90 | Fill #3 | Status: AC

## 2019-04-08 MED FILL — TACROLIMUS 0.5 MG CAPSULE, IMMEDIATE-RELEASE: 30 days supply | Qty: 90 | Fill #3

## 2019-04-15 ENCOUNTER — Encounter: Admit: 2019-04-15 | Discharge: 2019-04-16 | Payer: MEDICAID

## 2019-04-15 DIAGNOSIS — N3000 Acute cystitis without hematuria: Secondary | ICD-10-CM

## 2019-04-15 DIAGNOSIS — Z0000 Encounter for general adult medical examination without abnormal findings: Secondary | ICD-10-CM

## 2019-04-15 DIAGNOSIS — Z940 Kidney transplant status: Secondary | ICD-10-CM

## 2019-04-15 LAB — URINALYSIS
BILIRUBIN UA: NEGATIVE
GLUCOSE UA: NEGATIVE
KETONES UA: NEGATIVE
NITRITE UA: NEGATIVE
PH UA: 5.5 (ref 5.0–9.0)
PROTEIN UA: NEGATIVE
SPECIFIC GRAVITY UA: 1.015 (ref 1.005–1.040)
UROBILINOGEN UA: 0.2
WBC UA: >25 /HPF — ABNORMAL HIGH (ref 0–5)

## 2019-04-15 LAB — BILIRUBIN UA: Bilirubin:PrThr:Pt:Urine:Ord:Test strip: NEGATIVE

## 2019-04-15 MED ORDER — CEFDINIR 300 MG CAPSULE
ORAL_CAPSULE | Freq: Two times a day (BID) | ORAL | 0 refills | 7.00000 days | Status: CP
Start: 2019-04-15 — End: 2019-04-22

## 2019-04-15 NOTE — Unmapped (Signed)
Pt report pain and burning with urination and left UA/UC at the lab. Advised to start cefdinir 300mg  bid x7d and  stop abx and notify us if rash or any other concerns.

## 2019-04-19 ENCOUNTER — Ambulatory Visit: Admit: 2019-04-19 | Discharge: 2019-04-20 | Payer: MEDICAID

## 2019-04-19 LAB — CBC W/ AUTO DIFF
BASOPHILS ABSOLUTE COUNT: 0 10*9/L (ref 0.0–0.1)
BASOPHILS RELATIVE PERCENT: 0.3 %
EOSINOPHILS ABSOLUTE COUNT: 0.3 10*9/L (ref 0.0–0.4)
EOSINOPHILS RELATIVE PERCENT: 5.5 %
HEMOGLOBIN: 10.3 g/dL — ABNORMAL LOW (ref 13.5–16.0)
LYMPHOCYTES ABSOLUTE COUNT: 0.9 10*9/L — ABNORMAL LOW (ref 1.5–5.0)
LYMPHOCYTES RELATIVE PERCENT: 19 %
MEAN CORPUSCULAR HEMOGLOBIN CONC: 30.2 g/dL — ABNORMAL LOW (ref 31.0–37.0)
MEAN CORPUSCULAR HEMOGLOBIN: 19.1 pg — ABNORMAL LOW (ref 26.0–34.0)
MEAN CORPUSCULAR VOLUME: 63.1 fL — ABNORMAL LOW (ref 80.0–100.0)
MEAN PLATELET VOLUME: 6.7 fL — ABNORMAL LOW (ref 7.0–10.0)
MONOCYTES ABSOLUTE COUNT: 0.5 10*9/L (ref 0.2–0.8)
MONOCYTES RELATIVE PERCENT: 10.1 %
NEUTROPHILS ABSOLUTE COUNT: 3.1 10*9/L (ref 2.0–7.5)
PLATELET COUNT: 176 10*9/L (ref 150–440)
RED BLOOD CELL COUNT: 5.39 10*12/L — ABNORMAL HIGH (ref 4.00–5.20)
RED CELL DISTRIBUTION WIDTH: 16.5 % — ABNORMAL HIGH (ref 12.0–15.0)
WBC ADJUSTED: 4.9 10*9/L (ref 4.5–11.0)

## 2019-04-19 LAB — COMPREHENSIVE METABOLIC PANEL
ALBUMIN: 4.4 g/dL (ref 3.5–5.0)
ALKALINE PHOSPHATASE: 127 U/L — ABNORMAL HIGH (ref 38–126)
ALT (SGPT): 14 U/L (ref <35)
ANION GAP: 15 mmol/L (ref 7–15)
AST (SGOT): 26 U/L (ref 14–38)
BILIRUBIN TOTAL: 0.7 mg/dL (ref 0.0–1.2)
BLOOD UREA NITROGEN: 16 mg/dL (ref 7–21)
BUN / CREAT RATIO: 19
CALCIUM: 10.5 mg/dL — ABNORMAL HIGH (ref 8.5–10.2)
CHLORIDE: 108 mmol/L — ABNORMAL HIGH (ref 98–107)
CO2: 21 mmol/L — ABNORMAL LOW (ref 22.0–30.0)
CREATININE: 0.83 mg/dL (ref 0.60–1.00)
EGFR CKD-EPI AA FEMALE: >90 mL/min/{1.73_m2} (ref >=60)
GLUCOSE RANDOM: 94 mg/dL (ref 70–179)
POTASSIUM: 4.4 mmol/L (ref 3.5–5.0)
PROTEIN TOTAL: 7.3 g/dL (ref 6.5–8.3)
SODIUM: 144 mmol/L (ref 135–145)

## 2019-04-19 LAB — URINALYSIS
BACTERIA: NONE SEEN /HPF
BILIRUBIN UA: NEGATIVE
BLOOD UA: NEGATIVE
GLUCOSE UA: NEGATIVE
KETONES UA: NEGATIVE
LEUKOCYTE ESTERASE UA: NEGATIVE
NITRITE UA: NEGATIVE
PH UA: 5.5 (ref 5.0–9.0)
RBC UA: 7 /HPF — ABNORMAL HIGH (ref <4)
SPECIFIC GRAVITY UA: 1.004 — ABNORMAL LOW (ref 1.005–1.040)
SQUAMOUS EPITHELIAL: 1 /HPF (ref 0–5)
UROBILINOGEN UA: 0.2
WBC UA: 2 /HPF (ref 0–5)

## 2019-04-19 LAB — BASOPHILIC STIPPLING

## 2019-04-19 LAB — SLIDE REVIEW

## 2019-04-19 LAB — PHOSPHORUS: Phosphate:MCnc:Pt:Ser/Plas:Qn:: 3.9

## 2019-04-19 LAB — VARIABLE HEMOGLOBIN CONCENTRATION

## 2019-04-19 LAB — PROTEIN URINE: Protein:MCnc:Pt:Urine:Qn:: <4

## 2019-04-19 LAB — BACTERIA: Bacteria:PrThr:Pt:Urine:Ord:: NONE SEEN

## 2019-04-19 LAB — TACROLIMUS, TROUGH: Lab: 3.2 — ABNORMAL LOW

## 2019-04-19 LAB — PROTEIN / CREATININE RATIO, URINE: CREATININE, URINE: 42.1 mg/dL

## 2019-04-19 LAB — MAGNESIUM: Magnesium:MCnc:Pt:Ser/Plas:Qn:: 1.7

## 2019-04-19 LAB — SODIUM: Sodium:SCnc:Pt:Ser/Plas:Qn:: 144

## 2019-04-21 LAB — BK BLOOD RESULT: Lab: NOT DETECTED

## 2019-04-21 LAB — BK VIRUS QUANTITATIVE PCR, BLOOD: BK BLOOD RESULT: NOT DETECTED

## 2019-04-22 LAB — CMV DNA, QUANTITATIVE, PCR

## 2019-04-22 LAB — CMV VIRAL LD: Lab: NOT DETECTED

## 2019-05-03 NOTE — Unmapped (Signed)
[name] Refill Coordination Note    Specialty Medication(s) to be Shipped:   Transplant: Myfortic 180mg , tacrolimus 0.5mg  and Sensipar 30mg     Other medication(s) to be shipped:  Mag Ox  Chlorthalidone      Lynn Erickson, DOB: 1965/07/14  Phone: [phone] (home) [phone] (work)      All above HIPAA information was verified with patient.     Completed refill call assessment today to schedule patient's medication shipment from the [facility] [phone]).       Specialty medication(s) and dose(s) confirmed: Regimen is correct and unchanged.   Changes to medications: [name] reports no changes at this time.  Changes to insurance: No  Questions for the pharmacist: No    Confirmed patient received Welcome Packet with first shipment. The patient will receive a drug information handout for each medication shipped and additional FDA Medication Guides as required.       DISEASE/MEDICATION-SPECIFIC INFORMATION        N/A    SPECIALTY MEDICATION ADHERENCE     Medication Adherence    Patient reported X missed doses in the last month: 0  Adherence tools used: patient uses a pill box to manage medications        Myfortic 180 mg: 9 days of medicine on hand   Tacrolimus 0.5 mg: 9 days of medicine on hand   Sensipar 60 mg: 9 days of medicine on hand               SHIPPING     Shipping address confirmed in Epic.     Delivery Scheduled: Yes, Expected medication delivery date: 05/10/19.     Medication will be delivered via UPS to the home address in Epic [place].    Lynn Erickson   [facility] Specialty Technician

## 2019-05-07 MED FILL — TACROLIMUS 0.5 MG CAPSULE, IMMEDIATE-RELEASE: 30 days supply | Qty: 90 | Fill #4

## 2019-05-07 MED FILL — MAGNESIUM OXIDE 400 MG (241.3 MG MAGNESIUM) TABLET: 30 days supply | Qty: 30 | Fill #3 | Status: AC

## 2019-05-07 MED FILL — TACROLIMUS 0.5 MG CAPSULE: 30 days supply | Qty: 90 | Fill #4 | Status: AC

## 2019-05-07 MED FILL — MAGNESIUM OXIDE 400 MG (241.3 MG MAGNESIUM) TABLET: ORAL | 30 days supply | Qty: 30 | Fill #3

## 2019-05-07 MED FILL — MYFORTIC 180 MG TABLET,DELAYED RELEASE: ORAL | 30 days supply | Qty: 180 | Fill #1

## 2019-05-07 MED FILL — CINACALCET 30 MG TABLET: ORAL | 30 days supply | Qty: 30 | Fill #8

## 2019-05-07 MED FILL — MYFORTIC 180 MG TABLET,DELAYED RELEASE: 30 days supply | Qty: 180 | Fill #1 | Status: AC

## 2019-05-07 MED FILL — CHLORTHALIDONE 25 MG TABLET: 30 days supply | Qty: 30 | Fill #2 | Status: AC

## 2019-05-07 MED FILL — CHLORTHALIDONE 25 MG TABLET: ORAL | 30 days supply | Qty: 30 | Fill #2

## 2019-05-07 MED FILL — CINACALCET 30 MG TABLET: 30 days supply | Qty: 30 | Fill #8 | Status: AC

## 2019-05-18 ENCOUNTER — Non-Acute Institutional Stay: Admit: 2019-05-18 | Discharge: 2019-05-19 | Payer: MEDICAID

## 2019-05-18 LAB — CBC W/ AUTO DIFF
BASOPHILS ABSOLUTE COUNT: 0 10*9/L (ref 0.0–0.1)
BASOPHILS RELATIVE PERCENT: 0.2 %
EOSINOPHILS ABSOLUTE COUNT: 0.3 10*9/L (ref 0.0–0.4)
HEMATOCRIT: 34.9 % — ABNORMAL LOW (ref 36.0–46.0)
HEMOGLOBIN: 10.6 g/dL — ABNORMAL LOW (ref 13.5–16.0)
LYMPHOCYTES ABSOLUTE COUNT: 0.8 10*9/L — ABNORMAL LOW (ref 1.5–5.0)
LYMPHOCYTES RELATIVE PERCENT: 19.5 %
MEAN CORPUSCULAR HEMOGLOBIN CONC: 30.4 g/dL — ABNORMAL LOW (ref 31.0–37.0)
MEAN CORPUSCULAR HEMOGLOBIN: 19.1 pg — ABNORMAL LOW (ref 26.0–34.0)
MEAN CORPUSCULAR VOLUME: 63 fL — ABNORMAL LOW (ref 80.0–100.0)
MEAN PLATELET VOLUME: 6.5 fL — ABNORMAL LOW (ref 7.0–10.0)
MONOCYTES ABSOLUTE COUNT: 0.3 10*9/L (ref 0.2–0.8)
MONOCYTES RELATIVE PERCENT: 7.6 %
NEUTROPHILS ABSOLUTE COUNT: 2.6 10*9/L (ref 2.0–7.5)
NEUTROPHILS RELATIVE PERCENT: 64 %
PLATELET COUNT: 167 10*9/L (ref 150–440)
RED BLOOD CELL COUNT: 5.55 10*12/L — ABNORMAL HIGH (ref 4.00–5.20)
WBC ADJUSTED: 4.1 10*9/L — ABNORMAL LOW (ref 4.5–11.0)

## 2019-05-18 LAB — COMPREHENSIVE METABOLIC PANEL
ALBUMIN: 4.3 g/dL (ref 3.5–5.0)
ALKALINE PHOSPHATASE: 120 U/L (ref 38–126)
ALT (SGPT): 21 U/L (ref <35)
ANION GAP: 8 mmol/L (ref 7–15)
AST (SGOT): 27 U/L (ref 14–38)
BILIRUBIN TOTAL: 0.6 mg/dL (ref 0.0–1.2)
BLOOD UREA NITROGEN: 20 mg/dL (ref 7–21)
BUN / CREAT RATIO: 27
CALCIUM: 10.4 mg/dL — ABNORMAL HIGH (ref 8.5–10.2)
CHLORIDE: 111 mmol/L — ABNORMAL HIGH (ref 98–107)
CO2: 23 mmol/L (ref 22.0–30.0)
CREATININE: 0.75 mg/dL (ref 0.60–1.00)
EGFR CKD-EPI AA FEMALE: >90 mL/min/{1.73_m2} (ref >=60)
EGFR CKD-EPI NON-AA FEMALE: >90 mL/min/{1.73_m2} (ref >=60)
POTASSIUM: 4.5 mmol/L (ref 3.5–5.0)
PROTEIN TOTAL: 7.2 g/dL (ref 6.5–8.3)
SODIUM: 142 mmol/L (ref 135–145)

## 2019-05-18 LAB — MAGNESIUM: Magnesium:MCnc:Pt:Ser/Plas:Qn:: 1.6

## 2019-05-18 LAB — URINALYSIS
BILIRUBIN UA: NEGATIVE
BLOOD UA: NEGATIVE
GLUCOSE UA: NEGATIVE
KETONES UA: NEGATIVE
NITRITE UA: NEGATIVE
PH UA: 5.5 (ref 5.0–9.0)
PROTEIN UA: NEGATIVE
RBC UA: <1 /HPF (ref <4)
SPECIFIC GRAVITY UA: 1.015 (ref 1.005–1.040)
SQUAMOUS EPITHELIAL: 1 /HPF (ref 0–5)
UROBILINOGEN UA: 0.2
WBC UA: 5 /HPF (ref 0–5)

## 2019-05-18 LAB — PROTEIN / CREATININE RATIO, URINE
CREATININE, URINE: 68.9 mg/dL
PROTEIN/CREAT RATIO, URINE: 0.06

## 2019-05-18 LAB — SODIUM: Sodium:SCnc:Pt:Ser/Plas:Qn:: 142

## 2019-05-18 LAB — PROTEIN UA: Protein:MCnc:Pt:Urine:Qn:Test strip: NEGATIVE

## 2019-05-18 LAB — SLIDE REVIEW

## 2019-05-18 LAB — PROTEIN/CREAT RATIO, URINE: Protein/Creatinine:MRto:Pt:Urine:Qn:: 0.06

## 2019-05-18 LAB — PHOSPHORUS: Phosphate:MCnc:Pt:Ser/Plas:Qn:: 3.9

## 2019-05-18 LAB — LARGE UNSTAINED CELLS: Lab: 2

## 2019-05-18 LAB — SMEAR REVIEW

## 2019-05-19 LAB — TACROLIMUS, TROUGH: Lab: 2.9 — ABNORMAL LOW

## 2019-05-20 LAB — CMV DNA, QUANTITATIVE, PCR

## 2019-05-20 LAB — CMV COMMENT: Lab: 0

## 2019-05-20 MED ORDER — TACROLIMUS 0.5 MG CAPSULE
ORAL_CAPSULE | Freq: Two times a day (BID) | ORAL | 11 refills | 30.00000 days | Status: CP
Start: 2019-05-20 — End: 2020-05-19

## 2019-05-20 NOTE — Unmapped (Signed)
Prograf 2.9 reviewed with Dr Toni Arthurs. Pt advised to increase prograf dose to 1 mg BID.

## 2019-05-25 DIAGNOSIS — Z940 Kidney transplant status: Principal | ICD-10-CM

## 2019-05-25 MED ORDER — TACROLIMUS 0.5 MG CAPSULE
ORAL_CAPSULE | Freq: Two times a day (BID) | ORAL | 11 refills | 30.00000 days | Status: CP
Start: 2019-05-25 — End: 2020-05-24
  Filled 2019-06-01: qty 120, 30d supply, fill #0

## 2019-05-25 NOTE — Unmapped (Signed)
Change in Tacrolimus dosage increase. Co-pay $1.30.

## 2019-05-28 NOTE — Unmapped (Signed)
[facility] Refill Coordination Note    Specialty Medication(s) to be Shipped:   Transplant: Myfortic 180mg , tacrolimus 0.5mg  and Sensipar 30mg   Other medication(s) to be shipped: Mag ox 400mg  & Chlorthalidone 25mg      Jodi Mourning, DOB: 1966/07/04  Phone: [phone] (home) [phone] (work)    All above HIPAA information was verified with patient.     Completed refill call assessment today to schedule patient's medication shipment from the [facility] [phone]).       Specialty medication(s) and dose(s) confirmed: Regimen is correct and unchanged.   Changes to medications: Margorie reports no changes at this time.  Changes to insurance: No  Questions for the pharmacist: No    Confirmed patient received Welcome Packet with first shipment. The patient will receive a drug information handout for each medication shipped and additional FDA Medication Guides as required.       DISEASE/MEDICATION-SPECIFIC INFORMATION        N/A    SPECIALTY MEDICATION ADHERENCE     Medication Adherence    Patient reported X missed doses in the last month: 0  Specialty Medication: Tacrolimus 0.5mg   Patient is on additional specialty medications: Yes  Additional Specialty Medications: Myfortic 180mg   Patient Reported Additional Medication X Missed Doses in the Last Month: 0  Patient is on more than two specialty medications: Yes  Specialty Medication: Cinacalcet 30mg   Patient Reported Additional Medication X Missed Doses in the Last Month: 0  Informant: patient  Reliability of informant: reliable  Adherence tools used: patient uses a pill box to manage medications        Myfortic 180 mg: 9 days of medicine on hand   Tacrolimus 0.5 mg: 9 days of medicine on hand   Sensipar 30 mg: 9 days of medicine on hand     SHIPPING     Shipping address confirmed in Epic.     Delivery Scheduled: Yes, Expected medication delivery date: 06/02/2019. Medication will be delivered via UPS to the home address in Epic [place].    Piercen Covino P Allena Katz   [facility] Specialty Technician

## 2019-06-01 MED FILL — CHLORTHALIDONE 25 MG TABLET: ORAL | 30 days supply | Qty: 30 | Fill #3

## 2019-06-01 MED FILL — TACROLIMUS 0.5 MG CAPSULE: 30 days supply | Qty: 120 | Fill #0 | Status: AC

## 2019-06-01 MED FILL — MYFORTIC 180 MG TABLET,DELAYED RELEASE: ORAL | 30 days supply | Qty: 180 | Fill #2

## 2019-06-01 MED FILL — CINACALCET 30 MG TABLET: 30 days supply | Qty: 30 | Fill #9 | Status: AC

## 2019-06-01 MED FILL — CINACALCET 30 MG TABLET: ORAL | 30 days supply | Qty: 30 | Fill #9

## 2019-06-01 MED FILL — MAGNESIUM OXIDE 400 MG (241.3 MG MAGNESIUM) TABLET: 30 days supply | Qty: 30 | Fill #4 | Status: AC

## 2019-06-01 MED FILL — CHLORTHALIDONE 25 MG TABLET: 30 days supply | Qty: 30 | Fill #3 | Status: AC

## 2019-06-01 MED FILL — MAGNESIUM OXIDE 400 MG (241.3 MG MAGNESIUM) TABLET: ORAL | 30 days supply | Qty: 30 | Fill #4

## 2019-06-01 MED FILL — MYFORTIC 180 MG TABLET,DELAYED RELEASE: 30 days supply | Qty: 180 | Fill #2 | Status: AC

## 2019-06-02 LAB — BK VIRUS QUANTITATIVE PCR, BLOOD

## 2019-06-02 LAB — BK BLOOD COMMENT: Lab: 0

## 2019-06-14 ENCOUNTER — Encounter: Admit: 2019-06-14 | Discharge: 2019-06-15 | Payer: MEDICAID

## 2019-06-14 LAB — COMPREHENSIVE METABOLIC PANEL
ALBUMIN: 4.6 g/dL (ref 3.5–5.0)
ALKALINE PHOSPHATASE: 119 U/L (ref 38–126)
ALT (SGPT): 18 U/L (ref <35)
ANION GAP: 9 mmol/L (ref 7–15)
AST (SGOT): 30 U/L (ref 14–38)
BILIRUBIN TOTAL: 0.7 mg/dL (ref 0.0–1.2)
BLOOD UREA NITROGEN: 17 mg/dL (ref 7–21)
BUN / CREAT RATIO: 24
CALCIUM: 10.4 mg/dL — ABNORMAL HIGH (ref 8.5–10.2)
CHLORIDE: 113 mmol/L — ABNORMAL HIGH (ref 98–107)
CO2: 22 mmol/L (ref 22.0–30.0)
CREATININE: 0.7 mg/dL (ref 0.60–1.00)
EGFR CKD-EPI AA FEMALE: >90 mL/min/{1.73_m2} (ref >=60)
POTASSIUM: 5.1 mmol/L — ABNORMAL HIGH (ref 3.5–5.0)
PROTEIN TOTAL: 7.3 g/dL (ref 6.5–8.3)
SODIUM: 144 mmol/L (ref 135–145)

## 2019-06-14 LAB — CBC W/ AUTO DIFF
BASOPHILS ABSOLUTE COUNT: 0 10*9/L (ref 0.0–0.1)
EOSINOPHILS ABSOLUTE COUNT: 0.2 10*9/L (ref 0.0–0.7)
EOSINOPHILS RELATIVE PERCENT: 4.4 %
HEMATOCRIT: 34.6 % — ABNORMAL LOW (ref 35.0–44.0)
HEMOGLOBIN: 10.8 g/dL — ABNORMAL LOW (ref 12.0–15.5)
LYMPHOCYTES ABSOLUTE COUNT: 1.1 10*9/L (ref 0.7–4.0)
MEAN CORPUSCULAR HEMOGLOBIN CONC: 31.2 g/dL (ref 30.0–36.0)
MEAN CORPUSCULAR HEMOGLOBIN: 19 pg — ABNORMAL LOW (ref 26.0–34.0)
MEAN CORPUSCULAR VOLUME: 61 fL — ABNORMAL LOW (ref 82.0–98.0)
MEAN PLATELET VOLUME: 9.3 fL (ref 7.0–10.0)
MONOCYTES ABSOLUTE COUNT: 0.5 10*9/L (ref 0.1–1.0)
MONOCYTES RELATIVE PERCENT: 12.9 %
NEUTROPHILS RELATIVE PERCENT: 54.5 %
PLATELET COUNT: 186 10*9/L (ref 150–450)
RED BLOOD CELL COUNT: 5.67 10*12/L — ABNORMAL HIGH (ref 3.90–5.03)
RED CELL DISTRIBUTION WIDTH: 16.7 % — ABNORMAL HIGH (ref 12.0–15.0)
WBC ADJUSTED: 4.1 10*9/L (ref 3.5–10.5)

## 2019-06-14 LAB — URINALYSIS
BACTERIA: NONE SEEN /HPF
BILIRUBIN UA: NEGATIVE
BLOOD UA: NEGATIVE
GLUCOSE UA: NEGATIVE
KETONES UA: NEGATIVE
LEUKOCYTE ESTERASE UA: NEGATIVE
NITRITE UA: NEGATIVE
PH UA: 6.5 (ref 5.0–9.0)
PROTEIN UA: NEGATIVE
RBC UA: 0 /HPF (ref <4)
SPECIFIC GRAVITY UA: 1.003 — ABNORMAL LOW (ref 1.005–1.040)
SQUAMOUS EPITHELIAL: 0 /HPF (ref 0–5)
UROBILINOGEN UA: 0.2
WBC UA: 0 /HPF (ref 0–5)

## 2019-06-14 LAB — LYMPHOCYTES RELATIVE PERCENT: Lymphocytes/100 leukocytes:NFr:Pt:Bld:Qn:Automated count: 27.1

## 2019-06-14 LAB — WBC UA: Leukocytes:Naric:Pt:Urine sed:Qn:Microscopy.light.HPF: 0

## 2019-06-14 LAB — MAGNESIUM: Magnesium:MCnc:Pt:Ser/Plas:Qn:: 1.6

## 2019-06-14 LAB — PHOSPHORUS: Phosphate:MCnc:Pt:Ser/Plas:Qn:: 3.7

## 2019-06-14 LAB — BUN / CREAT RATIO: Urea nitrogen/Creatinine:MRto:Pt:Ser/Plas:Qn:: 24

## 2019-06-14 LAB — PROTEIN / CREATININE RATIO, URINE: CREATININE, URINE: 16.5 mg/dL

## 2019-06-14 LAB — PROTEIN URINE: Protein:MCnc:Pt:Urine:Qn:: <4

## 2019-06-15 LAB — TACROLIMUS, TROUGH: Lab: 3.6 — ABNORMAL LOW

## 2019-06-16 LAB — CMV DNA, QUANTITATIVE, PCR

## 2019-06-16 LAB — CMV VIRAL LD: Lab: NOT DETECTED

## 2019-06-18 DIAGNOSIS — Z940 Kidney transplant status: Principal | ICD-10-CM

## 2019-06-18 LAB — BK BLOOD RESULT: Lab: NOT DETECTED

## 2019-06-18 LAB — BK VIRUS QUANTITATIVE PCR, BLOOD: BK BLOOD RESULT: NOT DETECTED

## 2019-06-18 MED ORDER — TACROLIMUS 0.5 MG CAPSULE
ORAL_CAPSULE | ORAL | 11 refills | 30 days | Status: CP
Start: 2019-06-18 — End: 2020-06-17
  Filled 2019-06-28: qty 150, 30d supply, fill #0

## 2019-06-21 NOTE — Unmapped (Signed)
Tacrolimus dose increase  Last filled 06/01/19  Copay = $0

## 2019-06-23 NOTE — Unmapped (Addendum)
12/18: per billing technician RK,  The patient's account has been credited $7.80. The same amount has been refunded to their credit card from their most recent payment on 12/16. A copy of the the refund receipt was emailed to the patient. It can take up to 14 day for the refund to be reflected on the card.. I called pt today to relay this info and had to LVM. Note added in wam for when pt calls back -ef      12/16: pt inquiring as to why myfortic is covered on part b for $0 while we have been billing tac to part d for $1.30 copay each month. Reaching out to triage to verify, may need to get management involved. Pt would like an answer by our January call. Notes added in med list to make sure we address this with patient in Jan at the latest -Massie Maroon      [facility] Clinical Assessment & Refill Coordination Note    Lynn Erickson, DOB: 1966-06-03  Phone: [phone] (home) [phone] (work)    All above HIPAA information was verified with patient.     Was a Nurse, learning disability used for this call? No    Specialty Medication(s):   Transplant: Myfortic 180mg , tacrolimus 0.5mg  and cinacalcet 30mg      Current Outpatient Medications   Medication Sig Dispense Refill   ??? chlorthalidone (HYGROTON) 25 MG tablet Take 1 tablet (25 mg total) by mouth every morning. 30 tablet 11   ??? cinacalcet (SENSIPAR) 30 MG tablet Take 1 tablet (30 mg total) by mouth daily. 90 tablet 3   ??? magnesium oxide (MAG-OX) 400 mg (241.3 mg magnesium) tablet Take 1 tablet by mouth once daily in the morning 30 tablet 11   ??? MYFORTIC 180 mg EC tablet Take 3 tablets (540 mg total) by mouth two (2) times a day. 180 tablet 11   ??? tacrolimus (PROGRAF) 0.5 MG capsule Take 3 capsules (1.5 mg total) by mouth daily AND 2 capsules (1 mg total) nightly. 150 capsule 11     No current facility-administered medications for this visit.         Changes to medications: see tac below    Allergies   Allergen Reactions ??? Plaquenil [Hydroxychloroquine] Other (See Comments)     Toxic maculopathy   ??? Shellfish Containing Products Anaphylaxis   ??? Bleomycin    ??? Aspirin Rash   ??? Penicillins Rash     skin testing 12/13/2016 negative, oral challenging pending for 03/15/2017   ??? 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       Changes to allergies: No    SPECIALTY MEDICATION ADHERENCE     Tacrolimus 0.5mg   : 10 days of medicine on hand   Myfortic 180mg   : 10 days of medicine on hand   cinacalcet 30mg   : 10 days of medicine on hand     Medication Adherence    Patient reported X missed doses in the last month: 0  Specialty Medication: cinacalcet 30mg   Patient is on additional specialty medications: Yes  Additional Specialty Medications: Myfortic 180mg   Patient Reported Additional Medication X Missed Doses in the Last Month: 0  Patient is on more than two specialty medications: Yes  Specialty Medication: tacrolimus 0.5mg   Patient Reported Additional Medication X Missed Doses in the Last Month: 0  Adherence tools used: patient uses a pill box to manage medications  Specialty medication(s) dose(s) confirmed: Patient reports changes to the regimen as follows: tac is now 1.5mg  am and 1mg  pm- pt aware and new rx on file     Are there any concerns with adherence? No    Adherence counseling provided? Not needed    CLINICAL MANAGEMENT AND INTERVENTION      Clinical Benefit Assessment:    Do you feel the medicine is effective or helping your condition? Yes    Clinical Benefit counseling provided? Not needed    Adverse Effects Assessment:    Are you experiencing any side effects? No    Are you experiencing difficulty administering your medicine? No    Quality of Life Assessment:    How many days over the past month did your transplant  keep you from your normal activities? For example, brushing your teeth or getting up in the morning. 0    Have you discussed this with your provider? Not needed Therapy Appropriateness:    Is therapy appropriate? Yes, therapy is appropriate and should be continued    DISEASE/MEDICATION-SPECIFIC INFORMATION      N/A    PATIENT SPECIFIC NEEDS     ? Does the patient have any physical, cognitive, or cultural barriers? No    ? Is the patient high risk? Yes, patient is taking a REMS drug. Medication is dispensed in compliance with REMS program.     ? Does the patient require a Care Management Plan? No     ? Does the patient require physician intervention or other additional services (i.e. nutrition, smoking cessation, social work)? No      SHIPPING     Specialty Medication(s) to be Shipped:   Transplant: Myfortic 180mg , tacrolimus 0.5mg  and cinacalcet 30mg     Other medication(s) to be shipped: chlorthalidone, magox- 5rx total     Changes to insurance: No    Delivery Scheduled: Yes, Expected medication delivery date: 06/29/2019.     Medication will be delivered via UPS to the confirmed prescription address in [facility].    The patient will receive a drug information handout for each medication shipped and additional FDA Medication Guides as required.  Verified that patient has previously received a Conservation officer, historic buildings.    All of the patient's questions and concerns have been addressed.    Thad Ranger   [facility] Specialty Pharmacist

## 2019-06-24 ENCOUNTER — Encounter: Admit: 2019-06-24 | Discharge: 2019-06-25 | Payer: MEDICAID

## 2019-06-24 DIAGNOSIS — Z940 Kidney transplant status: Principal | ICD-10-CM

## 2019-06-24 DIAGNOSIS — E119 Type 2 diabetes mellitus without complications: Principal | ICD-10-CM

## 2019-06-24 DIAGNOSIS — R8279 Other abnormal findings on microbiological examination of urine: Principal | ICD-10-CM

## 2019-06-24 LAB — URINALYSIS
BACTERIA: NONE SEEN /HPF
BILIRUBIN UA: NEGATIVE
BLOOD UA: NEGATIVE
GLUCOSE UA: NEGATIVE
KETONES UA: NEGATIVE
LEUKOCYTE ESTERASE UA: NEGATIVE
NITRITE UA: NEGATIVE
PH UA: 5.5 (ref 5.0–9.0)
PROTEIN UA: NEGATIVE
RBC UA: 3 /HPF (ref <4)
SPECIFIC GRAVITY UA: 1.025 (ref 1.005–1.040)
SQUAMOUS EPITHELIAL: 2 /HPF (ref 0–5)
UROBILINOGEN UA: 0.2

## 2019-06-24 LAB — COMPREHENSIVE METABOLIC PANEL
ALBUMIN: 4.6 g/dL (ref 3.5–5.0)
ALKALINE PHOSPHATASE: 146 U/L — ABNORMAL HIGH (ref 38–126)
ALT (SGPT): 19 U/L (ref <35)
ANION GAP: 11 mmol/L (ref 7–15)
AST (SGOT): 27 U/L (ref 14–38)
BILIRUBIN TOTAL: 0.6 mg/dL (ref 0.0–1.2)
BLOOD UREA NITROGEN: 20 mg/dL (ref 7–21)
BUN / CREAT RATIO: 27
CHLORIDE: 110 mmol/L — ABNORMAL HIGH (ref 98–107)
CO2: 22 mmol/L (ref 22.0–30.0)
CREATININE: 0.74 mg/dL (ref 0.60–1.00)
EGFR CKD-EPI AA FEMALE: >90 mL/min/{1.73_m2} (ref >=60)
EGFR CKD-EPI NON-AA FEMALE: >90 mL/min/{1.73_m2} (ref >=60)
GLUCOSE RANDOM: 100 mg/dL (ref 70–179)
POTASSIUM: 4.3 mmol/L (ref 3.5–5.0)
PROTEIN TOTAL: 7.3 g/dL (ref 6.5–8.3)
SODIUM: 143 mmol/L (ref 135–145)

## 2019-06-24 LAB — CBC W/ AUTO DIFF
BASOPHILS ABSOLUTE COUNT: 0 10*9/L (ref 0.0–0.1)
BASOPHILS RELATIVE PERCENT: 0.5 %
EOSINOPHILS ABSOLUTE COUNT: 0.2 10*9/L (ref 0.0–0.7)
HEMATOCRIT: 36 % (ref 35.0–44.0)
HEMOGLOBIN: 11.1 g/dL — ABNORMAL LOW (ref 12.0–15.5)
LYMPHOCYTES ABSOLUTE COUNT: 1.1 10*9/L (ref 0.7–4.0)
LYMPHOCYTES RELATIVE PERCENT: 25.7 %
MEAN CORPUSCULAR HEMOGLOBIN: 18.7 pg — ABNORMAL LOW (ref 26.0–34.0)
MEAN CORPUSCULAR VOLUME: 60.7 fL — ABNORMAL LOW (ref 82.0–98.0)
MEAN PLATELET VOLUME: 9 fL (ref 7.0–10.0)
MONOCYTES ABSOLUTE COUNT: 0.5 10*9/L (ref 0.1–1.0)
MONOCYTES RELATIVE PERCENT: 11.4 %
NEUTROPHILS ABSOLUTE COUNT: 2.4 10*9/L (ref 1.7–7.7)
NEUTROPHILS RELATIVE PERCENT: 57.6 %
PLATELET COUNT: 196 10*9/L (ref 150–450)
RED CELL DISTRIBUTION WIDTH: 16.5 % — ABNORMAL HIGH (ref 12.0–15.0)
WBC ADJUSTED: 4.2 10*9/L (ref 3.5–10.5)

## 2019-06-24 LAB — TACROLIMUS, TROUGH: Lab: 5.4

## 2019-06-24 LAB — HEMOGLOBIN A1C
ESTIMATED AVERAGE GLUCOSE: 105 mg/dL
HEMOGLOBIN A1C: 5.3 % (ref 4.8–5.6)
Hemoglobin A1c/Hemoglobin.total:MFr:Pt:Bld:Qn:: 5.3

## 2019-06-24 LAB — PH UA: pH:LsCnc:Pt:Urine:Qn:Test strip: 5.5

## 2019-06-24 LAB — ALKALINE PHOSPHATASE: Alkaline phosphatase:CCnc:Pt:Ser/Plas:Qn:: 146 — ABNORMAL HIGH

## 2019-06-24 LAB — PHOSPHORUS: Phosphate:MCnc:Pt:Ser/Plas:Qn:: 4.1

## 2019-06-24 LAB — OVALOCYTES

## 2019-06-24 LAB — MICROCYTES

## 2019-06-24 LAB — PROTEIN URINE: Protein:MCnc:Pt:Urine:Qn:: 7.8

## 2019-06-24 LAB — SLIDE REVIEW

## 2019-06-24 LAB — MAGNESIUM: Magnesium:MCnc:Pt:Ser/Plas:Qn:: 1.5 — ABNORMAL LOW

## 2019-06-25 ENCOUNTER — Encounter: Admit: 2019-06-25 | Discharge: 2019-06-26 | Payer: MEDICAID | Attending: Nephrology | Primary: Nephrology

## 2019-06-25 DIAGNOSIS — R8279 Other abnormal findings on microbiological examination of urine: Principal | ICD-10-CM

## 2019-06-25 DIAGNOSIS — Z940 Kidney transplant status: Principal | ICD-10-CM

## 2019-06-25 LAB — URINALYSIS
BACTERIA: NONE SEEN /HPF
BILIRUBIN UA: NEGATIVE
BLOOD UA: NEGATIVE
GLUCOSE UA: NEGATIVE
KETONES UA: NEGATIVE
LEUKOCYTE ESTERASE UA: NEGATIVE
NITRITE UA: NEGATIVE
PH UA: 5 (ref 5.0–9.0)
RBC UA: <1 /HPF (ref <=4)
SPECIFIC GRAVITY UA: 1.005 (ref 1.003–1.030)
SQUAMOUS EPITHELIAL: <1 /HPF (ref 0–5)
UROBILINOGEN UA: 0.2
WBC UA: <1 /HPF (ref 0–5)

## 2019-06-25 LAB — CMV DNA, QUANTITATIVE, PCR: CMV VIRAL LD: NOT DETECTED

## 2019-06-25 LAB — CMV VIRAL LD: Lab: NOT DETECTED

## 2019-06-25 LAB — KETONES UA: Ketones:MCnc:Pt:Urine:Qn:Test strip: NEGATIVE

## 2019-06-25 LAB — PROTEIN / CREATININE RATIO, URINE: CREATININE, URINE: 53.8 mg/dL

## 2019-06-25 LAB — PROTEIN/CREAT RATIO, URINE: Protein/Creatinine:MRto:Pt:Urine:Qn:: 0

## 2019-06-25 MED ORDER — CHLORTHALIDONE 25 MG TABLET
ORAL_TABLET | Freq: Every morning | ORAL | 11 refills | 30 days
Start: 2019-06-25 — End: 2020-06-24

## 2019-06-25 NOTE — Unmapped (Signed)
AOBP:Right arm  Medium cuff   Average:108/72 Pulse:84  1st reading:107/72 Pulse:83  2nd reading:111/72 Pulse:84  3rd reading:106/72 Pulse:85

## 2019-06-28 MED FILL — MYFORTIC 180 MG TABLET,DELAYED RELEASE: ORAL | 30 days supply | Qty: 180 | Fill #3

## 2019-06-28 MED FILL — MYFORTIC 180 MG TABLET,DELAYED RELEASE: 30 days supply | Qty: 180 | Fill #3 | Status: AC

## 2019-06-28 MED FILL — MAGNESIUM OXIDE 400 MG (241.3 MG MAGNESIUM) TABLET: ORAL | 30 days supply | Qty: 30 | Fill #5

## 2019-06-28 MED FILL — TACROLIMUS 0.5 MG CAPSULE: 30 days supply | Qty: 150 | Fill #0 | Status: AC

## 2019-06-28 MED FILL — CINACALCET 30 MG TABLET: 30 days supply | Qty: 30 | Fill #10 | Status: AC

## 2019-06-28 MED FILL — MAGNESIUM OXIDE 400 MG (241.3 MG MAGNESIUM) TABLET: 30 days supply | Qty: 30 | Fill #5 | Status: AC

## 2019-06-28 MED FILL — CINACALCET 30 MG TABLET: ORAL | 30 days supply | Qty: 30 | Fill #10

## 2019-06-29 LAB — BK VIRUS QUANTITATIVE PCR, BLOOD

## 2019-06-29 LAB — BK BLOOD RESULT: Lab: NOT DETECTED

## 2019-06-30 LAB — FSAB CLASS 1 ANTIBODY SPECIFICITY: HLA CLASS 1 ANTIBODY RESULT: NEGATIVE

## 2019-06-30 LAB — HLA DS POST TRANSPLANT
ANTI-DONOR HLA-A #1 MFI: 150 MFI
ANTI-DONOR HLA-A #2 MFI: 35 MFI
ANTI-DONOR HLA-B #1 MFI: 22 MFI
ANTI-DONOR HLA-B #2 MFI: 20 MFI
ANTI-DONOR HLA-C #1 MFI: 1 MFI
ANTI-DONOR HLA-C #2 MFI: 0 MFI
ANTI-DONOR HLA-DP #2 MFI: 105 MFI
ANTI-DONOR HLA-DQB #1 MFI: 15 MFI
ANTI-DONOR HLA-DQB #2 MFI: 148 MFI
ANTI-DONOR HLA-DR #1 MFI: 34 MFI
ANTI-DONOR HLA-DR #2 MFI: 91 MFI

## 2019-06-30 LAB — HLA CL2 AB COMMENT: Lab: 0

## 2019-06-30 LAB — HLA CLASS 1 ANTIBODY RESULT: Lab: NEGATIVE

## 2019-06-30 LAB — DONOR HLA-DQB ANTIGEN #1

## 2019-07-06 MED ORDER — CHLORTHALIDONE 25 MG TABLET
ORAL_TABLET | Freq: Every morning | ORAL | 11 refills | 30 days | Status: CP
Start: 2019-07-06 — End: 2020-07-05
  Filled 2019-09-08: qty 15, 30d supply, fill #0

## 2019-07-15 NOTE — Unmapped (Signed)
[facility] [employer] transplant nephrology telemedicine visit    assessment and plan  1. s/p kidney transplant 10/12/2016. baseline creatinine 0.7-0.8 mg/dl. no proteinuria. no donor specific hla ab detected.   2. immunosuppression. mycophenolate sodium 540mg  bid. tacrolimus 12hr lvl 5-8 ng/ml.  3. hypertension. chlorthalidone decr to 12.5mg  daily. blood pressure goal < 130/80 mmhg.   4. preventive medicine. influenza '19. pcv13 pneumococcal '17. ppsv23 pneumococcal '19. zoster '19 with booster recommended. mammogram '19. gyn exam/pap smear '19. colonoscopy '16 with 5 year follow up recommended. kidney ultrasound '20.    history of present illness    [name] is a 54 year old woman seen in follow up post kidney transplant 10/12/2016. +muscle cramping bilateral lower ext. no fever chills or sweats. no headache or lightheaded. no chest pain palpitations orthopnea or shortness of breath. no lower extremity edema. no abdominal pain no n/v/d. no myalgias or arthralgias. no dysuria hematuria or difficulty voiding. all other systems reviewed and negative x10 systems.    past medical hx:  1. 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 baseline creatinine 0.7-0.8 mg/dl.  2. systemic lupus erythematosus   3. hypertension  4. secondary/tertiary hyperparathyroidism  5. diastolic lv dysfunction. nl lvef 60-65% echocardiogram '18.    past surgical hx: left upper ext av graft x2 '07-08. bilateral tubal ligation. kidney transplant '18. left upper ext avg ligation '19.  ??  allergies: penicillin, vancomycin, aspirin, shellfish, hydroxychloroquine.  ??  medications: tacrolimus 1.5mg /1mg  am/pm, mycophenolate sodium 540mg  bid, carvedilol 25mg  bid, cinacalcet 30mg  daily, mg oxide 400mg  daily, famotidine 20mg  prn.     soc hx: married x4 children. no smoking hx physical exam: t96.5 p84 bp108/72 wt67.4kg bmi 34.5. wd/wn woman nl/appropriate affect and mood. nl sclera anicteric. mmm no thrush. neck supple no palpable ln. heart rrr nl s1s2 no m/r/g. lungs clear bilateral. abd soft nt/nd. no lower ext edema. msk no synovitis/tophi. skin no rash. neuro alert oriented non focal exam.    labs reviewed

## 2019-07-30 DIAGNOSIS — N390 Urinary tract infection, site not specified: Principal | ICD-10-CM

## 2019-07-30 MED ORDER — NITROFURANTOIN MACROCRYSTAL 100 MG CAPSULE
ORAL_CAPSULE | Freq: Two times a day (BID) | ORAL | 0 refills | 5.00000 days | Status: CP
Start: 2019-07-30 — End: 2019-08-04

## 2019-08-10 NOTE — Unmapped (Signed)
[facility] Refill Coordination Note    Specialty Medication(s) to be Shipped:   Transplant: Myfortic 180mg , tacrolimus 0.5mg  and Sensipar 30mg     Other medication(s) to be shipped: magnesium     Lynn Erickson, DOB: 1965-11-05  Phone: [phone] (home) [phone] (work)      All above HIPAA information was verified with patient.     Was a Nurse, learning disability used for this call? No    Completed refill call assessment today to schedule patient's medication shipment from the [facility] [phone]).       Specialty medication(s) and dose(s) confirmed: Regimen is correct and unchanged.   Changes to medications: Kahleah reports no changes at this time.  Changes to insurance: No  Questions for the pharmacist: No    Confirmed patient received Welcome Packet with first shipment. The patient will receive a drug information handout for each medication shipped and additional FDA Medication Guides as required.       DISEASE/MEDICATION-SPECIFIC INFORMATION        N/A    SPECIALTY MEDICATION ADHERENCE     Medication Adherence    Patient reported X missed doses in the last month: 0  Specialty Medication: Myfortic  Patient is on additional specialty medications: Yes  Additional Specialty Medications: Tacrolimus  Patient Reported Additional Medication X Missed Doses in the Last Month: 0  Patient is on more than two specialty medications: Yes  Specialty Medication: Sensipar  Patient Reported Additional Medication X Missed Doses in the Last Month: 0  Any gaps in refill history greater than 2 weeks in the last 3 months: no  Demonstrates understanding of importance of adherence: yes  Informant: patient  Adherence tools used: patient uses a pill box to manage medications                Myfortic 180mg : Patient has 6 days of medication on hand    Tacrolimus 0.5mg : Patient has 6 days of medication on hand    Sensipar 30mg : Patient has 6 days of medication on hand         SHIPPING     Shipping address confirmed in Epic.     Delivery Scheduled: Yes, Expected medication delivery date: 2/4.     Medication will be delivered via UPS to the prescription address in Epic W[facility].    Olga Millers   [facility] Specialty Technician

## 2019-08-11 MED FILL — MAGNESIUM OXIDE 400 MG (241.3 MG MAGNESIUM) TABLET: ORAL | 30 days supply | Qty: 30 | Fill #6

## 2019-08-11 MED FILL — TACROLIMUS 0.5 MG CAPSULE, IMMEDIATE-RELEASE: ORAL | 30 days supply | Qty: 150 | Fill #1

## 2019-08-11 MED FILL — MAGNESIUM OXIDE 400 MG (241.3 MG MAGNESIUM) TABLET: 30 days supply | Qty: 30 | Fill #6 | Status: AC

## 2019-08-11 MED FILL — MYFORTIC 180 MG TABLET,DELAYED RELEASE: 30 days supply | Qty: 180 | Fill #4 | Status: AC

## 2019-08-11 MED FILL — CINACALCET 30 MG TABLET: ORAL | 30 days supply | Qty: 30 | Fill #11

## 2019-08-11 MED FILL — MYFORTIC 180 MG TABLET,DELAYED RELEASE: ORAL | 30 days supply | Qty: 180 | Fill #4

## 2019-08-11 MED FILL — CINACALCET 30 MG TABLET: 30 days supply | Qty: 30 | Fill #11 | Status: AC

## 2019-08-11 MED FILL — TACROLIMUS 0.5 MG CAPSULE: 30 days supply | Qty: 150 | Fill #1 | Status: AC

## 2019-08-24 ENCOUNTER — Encounter: Admit: 2019-08-24 | Discharge: 2019-08-25 | Payer: MEDICAID

## 2019-08-24 LAB — URINALYSIS
BILIRUBIN UA: NEGATIVE
BLOOD UA: NEGATIVE
GLUCOSE UA: NEGATIVE
KETONES UA: NEGATIVE
LEUKOCYTE ESTERASE UA: NEGATIVE
NITRITE UA: NEGATIVE
PH UA: 6.5 (ref 5.0–9.0)
PROTEIN UA: NEGATIVE
RBC UA: 2 /HPF (ref <4)
SPECIFIC GRAVITY UA: 1.01 (ref 1.005–1.040)
SQUAMOUS EPITHELIAL: 2 /HPF (ref 0–5)
UROBILINOGEN UA: 0.2
WBC UA: 4 /HPF (ref 0–5)

## 2019-08-24 LAB — GLUCOSE UA: Glucose:MCnc:Pt:Urine:Qn:Test strip: NEGATIVE

## 2019-08-24 LAB — MAGNESIUM: Magnesium:MCnc:Pt:Ser/Plas:Qn:: 1.4 — ABNORMAL LOW

## 2019-08-24 LAB — COMPREHENSIVE METABOLIC PANEL
ALKALINE PHOSPHATASE: 121 U/L (ref 38–126)
ALT (SGPT): 20 U/L (ref <35)
ANION GAP: 14 mmol/L (ref 7–15)
AST (SGOT): 28 U/L (ref 14–38)
BILIRUBIN TOTAL: 0.7 mg/dL (ref 0.0–1.2)
BLOOD UREA NITROGEN: 13 mg/dL (ref 7–21)
BUN / CREAT RATIO: 15
CALCIUM: 10.3 mg/dL — ABNORMAL HIGH (ref 8.5–10.2)
CHLORIDE: 107 mmol/L (ref 98–107)
CO2: 23 mmol/L (ref 22.0–30.0)
CREATININE: 0.84 mg/dL (ref 0.60–1.00)
EGFR CKD-EPI AA FEMALE: 90 mL/min/{1.73_m2} (ref >=60)
EGFR CKD-EPI NON-AA FEMALE: 78 mL/min/{1.73_m2} (ref >=60)
GLUCOSE RANDOM: 99 mg/dL (ref 70–179)
PROTEIN TOTAL: 7 g/dL (ref 6.5–8.3)
SODIUM: 144 mmol/L (ref 135–145)

## 2019-08-24 LAB — SMEAR REVIEW

## 2019-08-24 LAB — CBC W/ AUTO DIFF
BASOPHILS ABSOLUTE COUNT: 0 10*9/L (ref 0.0–0.1)
BASOPHILS RELATIVE PERCENT: 0.6 %
EOSINOPHILS RELATIVE PERCENT: 4.6 %
HEMATOCRIT: 34.3 % — ABNORMAL LOW (ref 35.0–44.0)
HEMOGLOBIN: 10.7 g/dL — ABNORMAL LOW (ref 12.0–15.5)
LYMPHOCYTES ABSOLUTE COUNT: 0.8 10*9/L (ref 0.7–4.0)
LYMPHOCYTES RELATIVE PERCENT: 18.1 %
MEAN CORPUSCULAR HEMOGLOBIN: 19 pg — ABNORMAL LOW (ref 26.0–34.0)
MEAN CORPUSCULAR VOLUME: 60.6 fL — ABNORMAL LOW (ref 82.0–98.0)
MEAN PLATELET VOLUME: 9 fL (ref 7.0–10.0)
MONOCYTES RELATIVE PERCENT: 7.9 %
NEUTROPHILS ABSOLUTE COUNT: 2.9 10*9/L (ref 1.7–7.7)
NEUTROPHILS RELATIVE PERCENT: 68.8 %
RED BLOOD CELL COUNT: 5.67 10*12/L — ABNORMAL HIGH (ref 3.90–5.03)
RED CELL DISTRIBUTION WIDTH: 16.6 % — ABNORMAL HIGH (ref 12.0–15.0)
WBC ADJUSTED: 4.2 10*9/L (ref 3.5–10.5)

## 2019-08-24 LAB — PROTEIN / CREATININE RATIO, URINE: CREATININE, URINE: 40.8 mg/dL

## 2019-08-24 LAB — HEMOGLOBIN A1C
HEMOGLOBIN A1C: 5.4 % (ref 4.8–5.6)
Hemoglobin A1c/Hemoglobin.total:MFr:Pt:Bld:Qn:: 5.4

## 2019-08-24 LAB — ALKALINE PHOSPHATASE: Alkaline phosphatase:CCnc:Pt:Ser/Plas:Qn:: 121

## 2019-08-24 LAB — BASOPHILS ABSOLUTE COUNT: Basophils:NCnc:Pt:Bld:Qn:Automated count: 0

## 2019-08-24 LAB — PHOSPHORUS: Phosphate:MCnc:Pt:Ser/Plas:Qn:: 3.9

## 2019-08-24 LAB — PROTEIN URINE: Protein:MCnc:Pt:Urine:Qn:: <4

## 2019-08-25 DIAGNOSIS — Z940 Kidney transplant status: Principal | ICD-10-CM

## 2019-08-25 LAB — TACROLIMUS, TROUGH: Lab: 3.9 — ABNORMAL LOW

## 2019-08-25 LAB — BK VIRUS QUANTITATIVE PCR, BLOOD
BK BLOOD QUANT: <200 [IU]/mL — ABNORMAL HIGH (ref <=0)
BK BLOOD RESULT: DETECTED — AB

## 2019-08-25 LAB — CMV DNA, QUANTITATIVE, PCR: CMV VIRAL LD: NOT DETECTED

## 2019-08-25 LAB — BK BLOOD RESULT: Lab: DETECTED — AB

## 2019-08-25 LAB — CMV VIRAL LD: Lab: NOT DETECTED

## 2019-08-25 MED ORDER — TACROLIMUS 0.5 MG CAPSULE
ORAL_CAPSULE | Freq: Two times a day (BID) | ORAL | 11 refills | 30.00000 days | Status: CP
Start: 2019-08-25 — End: 2020-08-24
  Filled 2019-09-08: qty 180, 30d supply, fill #0

## 2019-08-26 NOTE — Unmapped (Signed)
Change in Tacrolimus dosage increase. Co-pay $0.00.

## 2019-08-26 NOTE — Unmapped (Signed)
Pt advised to increase 1.5 mg BID. Last level 3.9. Will follow up on repeat labs in a week

## 2019-08-29 LAB — HLA CL2 AB RESULT: Lab: POSITIVE

## 2019-08-29 LAB — FSAB CLASS 1 ANTIBODY SPECIFICITY: HLA CLASS 1 ANTIBODY RESULT: NEGATIVE

## 2019-08-29 LAB — HLA DS POST TRANSPLANT
ANTI-DONOR DRW #1 MFI: 122 MFI
ANTI-DONOR HLA-A #1 MFI: 200 MFI
ANTI-DONOR HLA-A #2 MFI: 48 MFI
ANTI-DONOR HLA-B #1 MFI: 29 MFI
ANTI-DONOR HLA-B #2 MFI: 27 MFI
ANTI-DONOR HLA-C #1 MFI: 31 MFI
ANTI-DONOR HLA-C #2 MFI: 0 MFI
ANTI-DONOR HLA-DQB #1 MFI: 0 MFI
ANTI-DONOR HLA-DQB #2 MFI: 208 MFI
ANTI-DONOR HLA-DR #2 MFI: 125 MFI

## 2019-08-29 LAB — DONOR HLA-DP ANTIGEN #1

## 2019-08-29 LAB — FSAB CLASS 2 ANTIBODY SPECIFICITY

## 2019-08-29 LAB — HLA CLASS 1 ANTIBODY RESULT: Lab: NEGATIVE

## 2019-09-02 ENCOUNTER — Ambulatory Visit: Admit: 2019-09-02 | Discharge: 2019-09-03 | Payer: MEDICAID

## 2019-09-02 LAB — COMPREHENSIVE METABOLIC PANEL
ALBUMIN: 4.4 g/dL (ref 3.5–5.0)
ALKALINE PHOSPHATASE: 115 U/L (ref 38–126)
ALT (SGPT): 18 U/L (ref <35)
ANION GAP: 12 mmol/L (ref 7–15)
AST (SGOT): 25 U/L (ref 14–38)
BILIRUBIN TOTAL: 0.7 mg/dL (ref 0.0–1.2)
BLOOD UREA NITROGEN: 23 mg/dL — ABNORMAL HIGH (ref 7–21)
BUN / CREAT RATIO: 28
CALCIUM: 10.3 mg/dL — ABNORMAL HIGH (ref 8.5–10.2)
CHLORIDE: 109 mmol/L — ABNORMAL HIGH (ref 98–107)
CO2: 22 mmol/L (ref 22.0–30.0)
EGFR CKD-EPI AA FEMALE: >90 mL/min/{1.73_m2} (ref >=60)
EGFR CKD-EPI NON-AA FEMALE: 81 mL/min/{1.73_m2} (ref >=60)
GLUCOSE RANDOM: 95 mg/dL (ref 70–179)
POTASSIUM: 4.5 mmol/L (ref 3.5–5.0)
PROTEIN TOTAL: 7 g/dL (ref 6.5–8.3)
SODIUM: 143 mmol/L (ref 135–145)

## 2019-09-02 LAB — URINALYSIS
BACTERIA: NONE SEEN /HPF
BILIRUBIN UA: NEGATIVE
BLOOD UA: NEGATIVE
GLUCOSE UA: NEGATIVE
KETONES UA: NEGATIVE
NITRITE UA: NEGATIVE
PH UA: 6 (ref 5.0–9.0)
PROTEIN UA: NEGATIVE
RBC UA: 2 /HPF (ref <4)
SPECIFIC GRAVITY UA: 1.015 (ref 1.005–1.040)
SQUAMOUS EPITHELIAL: <1 /HPF (ref 0–5)
UROBILINOGEN UA: 0.2
WBC UA: 2 /HPF (ref 0–5)

## 2019-09-02 LAB — CBC W/ AUTO DIFF
BASOPHILS ABSOLUTE COUNT: 0 10*9/L (ref 0.0–0.1)
BASOPHILS RELATIVE PERCENT: 0.3 %
EOSINOPHILS ABSOLUTE COUNT: 0.1 10*9/L (ref 0.0–0.7)
EOSINOPHILS RELATIVE PERCENT: 3.5 %
HEMOGLOBIN: 10.3 g/dL — ABNORMAL LOW (ref 12.0–15.5)
LYMPHOCYTES RELATIVE PERCENT: 22.9 %
MEAN CORPUSCULAR HEMOGLOBIN CONC: 31.1 g/dL (ref 30.0–36.0)
MEAN CORPUSCULAR HEMOGLOBIN: 18.9 pg — ABNORMAL LOW (ref 26.0–34.0)
MEAN CORPUSCULAR VOLUME: 60.9 fL — ABNORMAL LOW (ref 82.0–98.0)
MEAN PLATELET VOLUME: 8.9 fL (ref 7.0–10.0)
MONOCYTES ABSOLUTE COUNT: 0.5 10*9/L (ref 0.1–1.0)
MONOCYTES RELATIVE PERCENT: 12.1 %
NEUTROPHILS ABSOLUTE COUNT: 2.5 10*9/L (ref 1.7–7.7)
NEUTROPHILS RELATIVE PERCENT: 61.2 %
PLATELET COUNT: 201 10*9/L (ref 150–450)
RED BLOOD CELL COUNT: 5.45 10*12/L — ABNORMAL HIGH (ref 3.90–5.03)
RED CELL DISTRIBUTION WIDTH: 16.7 % — ABNORMAL HIGH (ref 12.0–15.0)
WBC ADJUSTED: 4.1 10*9/L (ref 3.5–10.5)

## 2019-09-02 LAB — PROTEIN / CREATININE RATIO, URINE: PROTEIN URINE: <4 mg/dL

## 2019-09-02 LAB — MAGNESIUM: Magnesium:MCnc:Pt:Ser/Plas:Qn:: 1.4 — ABNORMAL LOW

## 2019-09-02 LAB — EGFR CKD-EPI AA FEMALE: Glomerular filtration rate/1.73 sq M.predicted.black:ArVRat:Pt:Ser/Plas/Bld:Qn:Creatinine-based formula (CKD-EPI): >90

## 2019-09-02 LAB — PHOSPHORUS: Phosphate:MCnc:Pt:Ser/Plas:Qn:: 3.6

## 2019-09-02 LAB — SMEAR REVIEW

## 2019-09-02 LAB — BASOPHILS ABSOLUTE COUNT: Basophils:NCnc:Pt:Bld:Qn:Automated count: 0

## 2019-09-02 LAB — BILIRUBIN UA: Bilirubin:PrThr:Pt:Urine:Ord:Test strip: NEGATIVE

## 2019-09-02 LAB — PROTEIN URINE: Protein:MCnc:Pt:Urine:Qn:: <4

## 2019-09-03 LAB — CMV DNA, QUANTITATIVE, PCR: CMV VIRAL LD: NOT DETECTED

## 2019-09-03 LAB — TACROLIMUS, TROUGH: Lab: 4.5 — ABNORMAL LOW

## 2019-09-03 LAB — CMV QUANT: Lab: 0

## 2019-09-03 MED ORDER — CINACALCET 30 MG TABLET
ORAL_TABLET | Freq: Every day | ORAL | 3 refills | 90 days | Status: CP
Start: 2019-09-03 — End: 2020-09-02
  Filled 2019-09-08: qty 90, 90d supply, fill #0

## 2019-09-03 NOTE — Unmapped (Signed)
[facility] Refill Coordination Note    Specialty Medication(s) to be Shipped:   Transplant: Myfortic 180mg , tacrolimus 0.5mg  and cinacalcet 30 mg   **Sent rf request for cinacalcet**    Other medication(s) to be shipped: magnesium and chlorthalidone 25mg      Lynn Erickson, DOB: [birth year]  Phone: [phone] (home) [phone] (work)      All above HIPAA information was verified with patient.     Was a Nurse, learning disability used for this call? No    Completed refill call assessment today to schedule patient's medication shipment from the [facility] [phone]).       Specialty medication(s) and dose(s) confirmed: Patient reports changes to the regimen as follows: Tacrolimus 0.5mg  - Take 3 capsules (1.5 mg total) by mouth two (2) times a day. **new rx is on profile**   Changes to medications: Lynn Erickson reports no changes at this time.  Changes to insurance: No  Questions for the pharmacist: No    Confirmed patient received Welcome Packet with first shipment. The patient will receive a drug information handout for each medication shipped and additional FDA Medication Guides as required.       DISEASE/MEDICATION-SPECIFIC INFORMATION        N/A    SPECIALTY MEDICATION ADHERENCE     Medication Adherence    Specialty Medication:  Cinacalcet 30mg   Patient is on additional specialty medications: Yes  Additional Specialty Medications: Tacrolimus 0.5mg   Patient Reported Additional Medication X Missed Doses in the Last Month: 0  Patient is on more than two specialty medications: Yes  Specialty Medication: Myfortic 180mg   Patient Reported Additional Medication X Missed Doses in the Last Month: 0  Adherence tools used: patient uses a pill box to manage medications          Cinacalcet 30 mg: 7 days of medicine on hand   Tacrolimus 0.5 mg: 10 days of medicine on hand   Myfortic 180 mg: 10 days of medicine on hand     SHIPPING     Shipping address confirmed in Epic.     Delivery Scheduled: Yes, Expected medication delivery date: 09/09/2019.     Medication will be delivered via UPS to the prescription address in Epic WAM.    [name] [facility] Specialty Technician

## 2019-09-08 MED FILL — CHLORTHALIDONE 25 MG TABLET: 30 days supply | Qty: 15 | Fill #0 | Status: AC

## 2019-09-08 MED FILL — MAGNESIUM OXIDE 400 MG (241.3 MG MAGNESIUM) TABLET: ORAL | 30 days supply | Qty: 30 | Fill #7

## 2019-09-08 MED FILL — MYFORTIC 180 MG TABLET,DELAYED RELEASE: 30 days supply | Qty: 180 | Fill #5 | Status: AC

## 2019-09-08 MED FILL — CINACALCET 30 MG TABLET: 90 days supply | Qty: 90 | Fill #0 | Status: AC

## 2019-09-08 MED FILL — MYFORTIC 180 MG TABLET,DELAYED RELEASE: ORAL | 30 days supply | Qty: 180 | Fill #5

## 2019-09-08 MED FILL — MAGNESIUM OXIDE 400 MG (241.3 MG MAGNESIUM) TABLET: 30 days supply | Qty: 30 | Fill #7 | Status: AC

## 2019-09-08 MED FILL — TACROLIMUS 0.5 MG CAPSULE: 30 days supply | Qty: 180 | Fill #0 | Status: AC

## 2019-09-29 NOTE — Unmapped (Signed)
[facility] Clinical Assessment & Refill Coordination Note    MERRIL ISAKSON, DOB: 1966/05/14  Phone: [phone] (home) [phone] (work)    All above HIPAA information was verified with patient.     Was a Nurse, learning disability used for this call? No    Specialty Medication(s):   Transplant: Myfortic 180mg  and tacrolimus 0.5mg  and cinacalcet 30mg      Current Outpatient Medications   Medication Sig Dispense Refill   ??? chlorthalidone (HYGROTON) 25 MG tablet Take one-half tablet (12.5 mg total) by mouth every morning. 15 tablet 11   ??? cinacalcet (SENSIPAR) 30 MG tablet Take 1 tablet (30 mg total) by mouth daily. 90 tablet 3   ??? magnesium oxide (MAG-OX) 400 mg (241.3 mg magnesium) tablet Take 1 tablet by mouth once daily in the morning 30 tablet 11   ??? MYFORTIC 180 mg EC tablet Take 3 tablets (540 mg total) by mouth two (2) times a day. 180 tablet 11   ??? tacrolimus (PROGRAF) 0.5 MG capsule Take 3 capsules (1.5 mg total) by mouth two (2) times a day. 180 capsule 11     No current facility-administered medications for this visit.         Changes to medications: Celsey reports no changes at this time.    Allergies   Allergen Reactions   ??? Plaquenil [Hydroxychloroquine] Other (See Comments)     Toxic maculopathy   ??? Shellfish Containing Products Anaphylaxis   ??? Bleomycin    ??? Aspirin Rash   ??? Penicillins Rash     skin testing 12/13/2016 negative, oral challenging pending for 03/15/2017   ??? 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       Changes to allergies: No    SPECIALTY MEDICATION ADHERENCE     Myfortic 180mg   : 12 days of medicine on hand   Tacrolimus 0.5mg   : 12 days of medicine on hand   cinacalcet 30mg : 60 days of medicine on hand    Medication Adherence    Patient reported X missed doses in the last month: 0  Specialty Medication: tacrolimus 0.5mg   Patient is on additional specialty medications: Yes  Additional Specialty Medications: Myfortic 180mg   Patient Reported Additional Medication X Missed Doses in the Last Month: 0  Patient is on more than two specialty medications: Yes  Specialty Medication: cinacalcet 30mg   Patient Reported Additional Medication X Missed Doses in the Last Month: 0  Adherence tools used: patient uses a pill box to manage medications          Specialty medication(s) dose(s) confirmed: Regimen is correct and unchanged.     Are there any concerns with adherence? No    Adherence counseling provided? Not needed    CLINICAL MANAGEMENT AND INTERVENTION      Clinical Benefit Assessment:    Do you feel the medicine is effective or helping your condition? Yes    Clinical Benefit counseling provided? Not needed    Adverse Effects Assessment:    Are you experiencing any side effects? No    Are you experiencing difficulty administering your medicine? No    Quality of Life Assessment:    How many days over the past month did your transplant  keep you from your normal activities? For example, brushing your teeth or getting up in the morning. 0    Have you discussed this with your provider? Not needed    Therapy Appropriateness:    Is therapy appropriate?  Yes, therapy is appropriate and should be continued    DISEASE/MEDICATION-SPECIFIC INFORMATION      N/A    PATIENT SPECIFIC NEEDS     ? Does the patient have any physical, cognitive, or cultural barriers? No    ? Is the patient high risk? Yes, patient is taking a REMS drug. Medication is dispensed in compliance with REMS program.     ? Does the patient require a Care Management Plan? No     ? Does the patient require physician intervention or other additional services (i.e. nutrition, smoking cessation, social work)? No      SHIPPING     Specialty Medication(s) to be Shipped:   Transplant: Myfortic 180mg  and tacrolimus 0.5mg     Other medication(s) to be shipped: chlorthalidone, magox     Changes to insurance: No    Delivery Scheduled: Yes, Expected medication delivery date: 10/07/2019.     Medication will be delivered via UPS to the confirmed prescription address in [facility].    The patient will receive a drug information handout for each medication shipped and additional FDA Medication Guides as required.  Verified that patient has previously received a Conservation officer, historic buildings.    All of the patient's questions and concerns have been addressed.    Thad Ranger   [facility] Specialty Pharmacist

## 2019-10-06 MED FILL — CHLORTHALIDONE 25 MG TABLET: ORAL | 30 days supply | Qty: 15 | Fill #1

## 2019-10-06 MED FILL — TACROLIMUS 0.5 MG CAPSULE, IMMEDIATE-RELEASE: ORAL | 30 days supply | Qty: 180 | Fill #1

## 2019-10-06 MED FILL — MAGNESIUM OXIDE 400 MG (241.3 MG MAGNESIUM) TABLET: 30 days supply | Qty: 30 | Fill #8 | Status: AC

## 2019-10-06 MED FILL — MAGNESIUM OXIDE 400 MG (241.3 MG MAGNESIUM) TABLET: ORAL | 30 days supply | Qty: 30 | Fill #8

## 2019-10-06 MED FILL — CHLORTHALIDONE 25 MG TABLET: 30 days supply | Qty: 15 | Fill #1 | Status: AC

## 2019-10-06 MED FILL — MYFORTIC 180 MG TABLET,DELAYED RELEASE: ORAL | 30 days supply | Qty: 180 | Fill #6

## 2019-10-06 MED FILL — TACROLIMUS 0.5 MG CAPSULE, IMMEDIATE-RELEASE: 30 days supply | Qty: 180 | Fill #1 | Status: AC

## 2019-10-06 MED FILL — MYFORTIC 180 MG TABLET,DELAYED RELEASE: 30 days supply | Qty: 180 | Fill #6 | Status: AC

## 2019-10-26 ENCOUNTER — Encounter: Admit: 2019-10-26 | Discharge: 2019-10-27 | Payer: MEDICAID

## 2019-10-26 DIAGNOSIS — Z940 Kidney transplant status: Principal | ICD-10-CM

## 2019-10-26 DIAGNOSIS — Z79899 Other long term (current) drug therapy: Principal | ICD-10-CM

## 2019-10-26 LAB — URINALYSIS
BACTERIA: NONE SEEN /HPF
BILIRUBIN UA: NEGATIVE
BLOOD UA: NEGATIVE
GLUCOSE UA: NEGATIVE
KETONES UA: NEGATIVE
LEUKOCYTE ESTERASE UA: NEGATIVE
NITRITE UA: NEGATIVE
PH UA: 6 (ref 5.0–9.0)
PROTEIN UA: NEGATIVE
RBC UA: 2 /HPF (ref <4)
SPECIFIC GRAVITY UA: 1.01 (ref 1.005–1.040)
SQUAMOUS EPITHELIAL: 1 /HPF (ref 0–5)
UROBILINOGEN UA: 0.2
WBC UA: 1 /HPF (ref 0–5)

## 2019-10-26 LAB — CBC W/ AUTO DIFF
BASOPHILS ABSOLUTE COUNT: 0 10*9/L (ref 0.0–0.1)
BASOPHILS RELATIVE PERCENT: 0.6 %
EOSINOPHILS RELATIVE PERCENT: 4.2 %
HEMATOCRIT: 33 % — ABNORMAL LOW (ref 35.0–44.0)
HEMOGLOBIN: 10.3 g/dL — ABNORMAL LOW (ref 12.0–15.5)
LYMPHOCYTES ABSOLUTE COUNT: 1 10*9/L (ref 0.7–4.0)
LYMPHOCYTES RELATIVE PERCENT: 24.1 %
MEAN CORPUSCULAR HEMOGLOBIN CONC: 31.2 g/dL (ref 30.0–36.0)
MEAN CORPUSCULAR HEMOGLOBIN: 18.9 pg — ABNORMAL LOW (ref 26.0–34.0)
MEAN CORPUSCULAR VOLUME: 60.7 fL — ABNORMAL LOW (ref 82.0–98.0)
MEAN PLATELET VOLUME: 8.9 fL (ref 7.0–10.0)
MONOCYTES ABSOLUTE COUNT: 0.4 10*9/L (ref 0.1–1.0)
MONOCYTES RELATIVE PERCENT: 9.6 %
NEUTROPHILS ABSOLUTE COUNT: 2.5 10*9/L (ref 1.7–7.7)
NUCLEATED RED BLOOD CELLS: 0 /100{WBCs} (ref <=4)
PLATELET COUNT: 191 10*9/L (ref 150–450)
RED BLOOD CELL COUNT: 5.43 10*12/L — ABNORMAL HIGH (ref 3.90–5.03)
RED CELL DISTRIBUTION WIDTH: 15.7 % — ABNORMAL HIGH (ref 12.0–15.0)

## 2019-10-26 LAB — COMPREHENSIVE METABOLIC PANEL
ALKALINE PHOSPHATASE: 124 U/L (ref 38–126)
ALT (SGPT): 22 U/L (ref <35)
ANION GAP: 12 mmol/L (ref 7–15)
AST (SGOT): 29 U/L (ref 14–38)
BILIRUBIN TOTAL: 0.5 mg/dL (ref 0.0–1.2)
BLOOD UREA NITROGEN: 27 mg/dL — ABNORMAL HIGH (ref 7–21)
BUN / CREAT RATIO: 27
CALCIUM: 10.1 mg/dL (ref 8.5–10.2)
CHLORIDE: 109 mmol/L — ABNORMAL HIGH (ref 98–107)
CO2: 21 mmol/L — ABNORMAL LOW (ref 22.0–30.0)
CREATININE: 1.01 mg/dL — ABNORMAL HIGH (ref 0.60–1.00)
EGFR CKD-EPI AA FEMALE: 72 mL/min/{1.73_m2} (ref >=60)
EGFR CKD-EPI NON-AA FEMALE: 63 mL/min/{1.73_m2} (ref >=60)
POTASSIUM: 4.6 mmol/L (ref 3.5–5.0)
PROTEIN TOTAL: 7.3 g/dL (ref 6.5–8.3)

## 2019-10-26 LAB — BLOOD UREA NITROGEN: Urea nitrogen:MCnc:Pt:Ser/Plas:Qn:: 27 — ABNORMAL HIGH

## 2019-10-26 LAB — SMEAR REVIEW

## 2019-10-26 LAB — MAGNESIUM: Magnesium:MCnc:Pt:Ser/Plas:Qn:: 1.4 — ABNORMAL LOW

## 2019-10-26 LAB — PHOSPHORUS: Phosphate:MCnc:Pt:Ser/Plas:Qn:: 3.7

## 2019-10-26 LAB — SLIDE REVIEW

## 2019-10-26 LAB — COLOR

## 2019-10-26 LAB — WBC ADJUSTED: Leukocytes:NCnc:Pt:Bld:Qn:: 4

## 2019-10-27 LAB — CMV DNA, QUANTITATIVE, PCR: CMV QUANT: <50 [IU]/mL — ABNORMAL HIGH (ref <0)

## 2019-10-27 LAB — CMV QUANT LOG10: Lab: 0

## 2019-10-27 LAB — TACROLIMUS, TROUGH: Lab: 6.9

## 2019-10-28 ENCOUNTER — Ambulatory Visit: Admit: 2019-10-28 | Discharge: 2019-10-28 | Payer: MEDICAID | Attending: Nephrology | Primary: Nephrology

## 2019-10-28 ENCOUNTER — Encounter: Admit: 2019-10-28 | Discharge: 2019-10-28 | Payer: MEDICAID

## 2019-10-28 DIAGNOSIS — Z940 Kidney transplant status: Principal | ICD-10-CM

## 2019-10-28 LAB — PROTEIN URINE: Protein:MCnc:Pt:Urine:Qn:: <4

## 2019-10-28 LAB — PROTEIN / CREATININE RATIO, URINE: PROTEIN URINE: <4 mg/dL

## 2019-10-28 MED ORDER — FAMOTIDINE 20 MG TABLET
ORAL_TABLET | Freq: Two times a day (BID) | ORAL | 11 refills | 30 days | Status: CP
Start: 2019-10-28 — End: 2020-10-27

## 2019-10-28 NOTE — Unmapped (Signed)
AOBP   Patient's blood pressure was taken on right upper arm, medium cuff.   1st reading 137/72, pulse: 81  2nd reading 126/82, pulse: 74  3rd reading 128/77, pulse: 73  Average reading 128/77, pulse: 73

## 2019-10-28 NOTE — Unmapped (Signed)
Patient Education     [facility]: [employer] [facility] [[address]] [address]  [address], [address]  [place], [place] [address]  Book a same day or next day appointment online at:  [email or web address]    Same Day Appointments can be made by calling our Appointment Center at [phone]       Ulnar Neuropathy (Handlebar Palsy): Exercises  Introduction  Here are some examples of exercises for you to try. The exercises may be suggested for a condition or for rehabilitation. Start each exercise slowly. Ease off the exercises if you start to have pain.  You will be told when to start these exercises and which ones will work best for you.  How to do the exercises  Neck rotation   1. Sit in a firm chair, or stand up straight.  2. Keeping your chin level, turn your head to the right, and hold for 15 to 30 seconds.  3. Turn your head to the left, and hold for 15 to 30 seconds.  4. Repeat 2 to 4 times to each side.    Shoulder blade squeeze   1. While standing with your arms at your sides, squeeze your shoulder blades together. Do not raise your shoulders as you are squeezing.  2. Hold for 6 seconds.  3. Repeat 8 to 12 times.    Neck stretches   1. Look straight ahead, and tip your right ear to your right shoulder. Do not let your left shoulder rise as you tip your head to the right.  2. Hold for 15 to 30 seconds.  3. Tilt your head to the left. Do not let your right shoulder rise as you tip your head to the left.  4. Hold for 15 to 30 seconds.  5. Repeat 2 to 4 times to each side.    Elbow flexion and extension   If this exercise causes numbness, tingling, or pain in your hand, ease off of the stretch. You should not have symptoms as you stretch. If you cannot back off enough so that you can do the exercise without symptoms, stop doing the exercise right away.  1. Stand with your arms relaxed at your sides.  2. With your affected arm, gently bend your elbow up toward you as far as possible.  3. Then straighten your arm as much as you can.  4. Repeat 2 to 4 times.    Wrist flexor stretch   If this exercise causes numbness, tingling, or pain in your hand, ease off of the stretch. You should not have symptoms as you stretch. If you cannot back off enough so that you can do the exercise without symptoms, stop doing the exercise right away.  1. Extend your affected arm in front of you with your palm facing away from your body.  2. Bend back your wrist on your affected arm, pointing your hand up toward the ceiling.  3. With your other hand, gently bend your wrist farther until you feel a mild to moderate stretch in your forearm.  4. Hold for at least 15 to 30 seconds.  5. Repeat 2 to 4 times.  6. Repeat steps 1 through 5, but this time extend your affected arm in front of you with your palm facing up. Then bend back your wrist, pointing your hand toward the floor.    Wrist flexion and extension   If this exercise causes numbness, tingling, or pain in your hand,  ease off of the stretch. You should not have symptoms as you stretch. If you cannot back off enough so that you can do the exercise without symptoms, stop doing the exercise right away.  1. Place your forearm on a table, with your affected hand and wrist extended beyond the table, palm down.  2. Slowly bend your wrist to move your hand upward and allow your hand to close into a fist. Hold for about 6 seconds.  3. Then lower your hand and allow your fingers to relax. Hold this position for about 6 seconds. You should feel a gentle stretch.  4. Repeat 8 to 12 times.    Follow-up care is a key part of your treatment and safety. Be sure to make and go to all appointments, and call your doctor if you are having problems. It's also a good idea to know your test results and keep a list of the medicines you take.  Where can you learn more?  Go to [facility] at [email or web address].org  Select Patient Education under [employer]. Enter [phone] in the search box to learn more about Ulnar Neuropathy (Handlebar Palsy): Exercises.  Current as of: May 24, 2019??????????????????????????????Content Version: 12.8  ?? 2006-2021 [employer].   Care instructions adapted under license by [facility]. If you have questions about a medical condition or this instruction, always ask your healthcare professional. [employer], Inc[employer] disclaims any warranty or liability for your use of this information.

## 2019-10-29 LAB — BK BLOOD QUANT: Lab: 0

## 2019-10-29 LAB — BK VIRUS QUANTITATIVE PCR, BLOOD: BK BLOOD RESULT: NOT DETECTED

## 2019-11-04 NOTE — Unmapped (Signed)
[facility] Pharmacy Refill Coordination Note    Specialty Medication(s) to be Shipped:   Transplant: Myfortic 180mg  and tacrolimus 0.5mg     Other medication(s) to be shipped: magox 400       Jodi Mourning, DOB: October 29, 1965  Phone: [phone] (home) [phone] (work)      All above HIPAA information was verified with patient.     Was a Nurse, learning disability used for this call? No    Completed refill call assessment today to schedule patient's medication shipment from the [facility] [phone]).       Specialty medication(s) and dose(s) confirmed: Regimen is correct and unchanged.   Changes to medications: Kelina reports no changes at this time.  Changes to insurance: No  Questions for the pharmacist: No    Confirmed patient received Welcome Packet with first shipment. The patient will receive a drug information handout for each medication shipped and additional FDA Medication Guides as required.       DISEASE/MEDICATION-SPECIFIC INFORMATION        N/A    SPECIALTY MEDICATION ADHERENCE     Medication Adherence    Adherence tools used: patient uses a pill box to manage medications                Myfortic 180mg   : 6 days of medicine on hand   Tacrolimus 0.5mg   : 6 days of medicine on hand         SHIPPING     Shipping address confirmed in Epic.     Delivery Scheduled: Yes, Expected medication delivery date: 4/30.     Medication will be delivered via Same Day Courier to the prescription address in Epic WAM.    Westley Gambles   [facility] Specialty Technician

## 2019-11-05 MED FILL — MAGNESIUM OXIDE 400 MG (241.3 MG MAGNESIUM) TABLET: 90 days supply | Qty: 90 | Fill #9 | Status: AC

## 2019-11-05 MED FILL — MYFORTIC 180 MG TABLET,DELAYED RELEASE: 30 days supply | Qty: 180 | Fill #7 | Status: AC

## 2019-11-05 MED FILL — TACROLIMUS 0.5 MG CAPSULE, IMMEDIATE-RELEASE: 30 days supply | Qty: 180 | Fill #2 | Status: AC

## 2019-11-05 MED FILL — TACROLIMUS 0.5 MG CAPSULE, IMMEDIATE-RELEASE: ORAL | 30 days supply | Qty: 180 | Fill #2

## 2019-11-05 MED FILL — MAGNESIUM OXIDE 400 MG (241.3 MG MAGNESIUM) TABLET: ORAL | 90 days supply | Qty: 90 | Fill #9

## 2019-11-05 MED FILL — MYFORTIC 180 MG TABLET,DELAYED RELEASE: ORAL | 30 days supply | Qty: 180 | Fill #7

## 2019-11-29 DIAGNOSIS — Z940 Kidney transplant status: Principal | ICD-10-CM

## 2019-11-29 MED ORDER — MYFORTIC 180 MG TABLET,DELAYED RELEASE
ORAL_TABLET | Freq: Two times a day (BID) | ORAL | 11 refills | 30 days | Status: CP
Start: 2019-11-29 — End: 2020-11-28

## 2019-11-29 NOTE — Unmapped (Signed)
[facility] Refill Coordination Note    Specialty Medication(s) to be Shipped:   Transplant: Myfortic 180mg  and tacrolimus 0.5mg    **Sent rf request for DAW 1 myfortic prescription due to pt now having MNC**  **Denied Cinacalcet; 3+ weeks on hand**    Other medication(s) to be shipped: chlorthalidone 25mg      Jodi Mourning, DOB: 10-24-1965  Phone: [phone] (home) [phone] (work)      All above HIPAA information was verified with patient.     Was a Nurse, learning disability used for this call? No    Completed refill call assessment today to schedule patient's medication shipment from the [facility] [phone]).       Specialty medication(s) and dose(s) confirmed: Regimen is correct and unchanged.   Changes to medications: Caridad reports no changes at this time.  Changes to insurance: Yes: Pt now has MNC  Questions for the pharmacist: No    Confirmed patient received Welcome Packet with first shipment. The patient will receive a drug information handout for each medication shipped and additional FDA Medication Guides as required.       DISEASE/MEDICATION-SPECIFIC INFORMATION        N/A    SPECIALTY MEDICATION ADHERENCE     Medication Adherence    Patient reported X missed doses in the last month: 0  Specialty Medication: Myfortic 180mg   Patient is on additional specialty medications: Yes  Additional Specialty Medications: Tacrolimus 0.5mg   Patient Reported Additional Medication X Missed Doses in the Last Month: 0  Patient is on more than two specialty medications: No  Adherence tools used: patient uses a pill box to manage medications          Myfortic 180 mg: 8 days of medicine on hand   Tacrolimus 0.5 mg: 12 days of medicine on hand     SHIPPING     Shipping address confirmed in Epic.     Delivery Scheduled: Yes, Expected medication delivery date: 12/03/2019.     Medication will be delivered via UPS to the prescription address in Epic WAM.    [name] [facility] Specialty Technician

## 2019-12-01 DIAGNOSIS — Z940 Kidney transplant status: Principal | ICD-10-CM

## 2019-12-01 MED ORDER — MYCOPHENOLATE SODIUM 180 MG TABLET,DELAYED RELEASE
ORAL_TABLET | Freq: Two times a day (BID) | ORAL | 11 refills | 30 days | Status: CP
Start: 2019-12-01 — End: 2020-11-30
  Filled 2019-12-02: qty 180, 30d supply, fill #0

## 2019-12-02 MED FILL — MYCOPHENOLATE SODIUM 180 MG TABLET,DELAYED RELEASE: 30 days supply | Qty: 180 | Fill #0 | Status: AC

## 2019-12-02 MED FILL — CHLORTHALIDONE 25 MG TABLET: ORAL | 30 days supply | Qty: 15 | Fill #2

## 2019-12-02 MED FILL — CHLORTHALIDONE 25 MG TABLET: 30 days supply | Qty: 15 | Fill #2 | Status: AC

## 2019-12-02 MED FILL — TACROLIMUS 0.5 MG CAPSULE, IMMEDIATE-RELEASE: 30 days supply | Qty: 180 | Fill #3 | Status: AC

## 2019-12-02 MED FILL — TACROLIMUS 0.5 MG CAPSULE, IMMEDIATE-RELEASE: ORAL | 30 days supply | Qty: 180 | Fill #3

## 2019-12-02 NOTE — Unmapped (Signed)
Change in Myfortic dosage Mycophenolate. Co-pay $3.00.

## 2019-12-14 NOTE — Unmapped (Signed)
[facility] Pharm[facility] Refill Coordination Note    Specialty Medication(s) to be Shipped:   Transplant: Cinacalcet 30mg     Other medication(s) to be shipped: N/A     Lynn Erickson, DOB: [birth year]  Phone: [phone] (home) [phone] (work)      All above HIPAA information was verified with patient.     Was a Nurse, learning disability used for this call? No    Completed refill call assessment today to schedule patient's medication shipment from the [facility] [phone]).       Specialty medication(s) and dose(s) confirmed: Regimen is correct and unchanged.   Changes to medications: Lynn Erickson reports no changes at this time.  Changes to insurance: No  Questions for the pharmacist: No    Confirmed patient received Welcome Packet with first shipment. The patient will receive a drug information handout for each medication shipped and additional FDA Medication Guides as required.       DISEASE/MEDICATION-SPECIFIC INFORMATION        N/A    SPECIALTY MEDICATION ADHERENCE     Medication Adherence    Patient reported X missed doses in the last month: 0  Specialty Medication: Cinacalcet 30mg   Patient is on additional specialty medications: No  Adherence tools used: patient uses a pill box to manage medications        Cinacalcet 30 mg: 12 days of medicine on hand     SHIPPING     Shipping address confirmed in Epic.     Delivery Scheduled: Yes, Expected medication delivery date: 12/21/2019.     Medication will be delivered via UPS to the prescription address in Epic WAM.    [name] [facility] Specialty Technician

## 2019-12-20 MED FILL — CINACALCET 30 MG TABLET: 90 days supply | Qty: 90 | Fill #1 | Status: AC

## 2019-12-20 MED FILL — CINACALCET 30 MG TABLET: ORAL | 90 days supply | Qty: 90 | Fill #1

## 2019-12-23 NOTE — Unmapped (Signed)
[facility] Special[facility] Pharmacy Refill Coordination Note    Specialty Medication(s) to be Shipped:   Transplant: mycophenolate mofetil 180mg  and tacrolimus 0.5mg     Other medication(s) to be shipped: chlorthalidone 25mg      Lynn Erickson, DOB: Jul 25, 1965  Phone: [phone] (home) [phone] (work)      All above HIPAA information was verified with patient.     Was a Nurse, learning disability used for this call? No    Completed refill call assessment today to schedule patient's medication shipment from the [facility] [phone]).       Specialty medication(s) and dose(s) confirmed: Regimen is correct and unchanged.   Changes to medications: Tarnesha reports no changes at this time.  Changes to insurance: No  Questions for the pharmacist: No    Confirmed patient received Welcome Packet with first shipment. The patient will receive a drug information handout for each medication shipped and additional FDA Medication Guides as required.       DISEASE/MEDICATION-SPECIFIC INFORMATION        N/A    SPECIALTY MEDICATION ADHERENCE     Medication Adherence    Patient reported X missed doses in the last month: 0  Specialty Medication: Tacrolimus 0.5mg   Patient is on additional specialty medications: Yes  Additional Specialty Medications: Mycophenolate 180mg   Patient Reported Additional Medication X Missed Doses in the Last Month: 0  Patient is on more than two specialty medications: No  Adherence tools used: patient uses a pill box to manage medications        Tacrolimus 0.5 mg: 10 days of medicine on hand   Mycophenolate 180 mg: 10 days of medicine on hand     SHIPPING     Shipping address confirmed in Epic.     Delivery Scheduled: Yes, Expected medication delivery date: 12/31/2019.     Medication will be delivered via UPS to the prescription address in Epic WAM.    [name] [facility] Specialty Technician

## 2019-12-30 MED FILL — CHLORTHALIDONE 25 MG TABLET: 30 days supply | Qty: 15 | Fill #3 | Status: AC

## 2019-12-30 MED FILL — MYCOPHENOLATE SODIUM 180 MG TABLET,DELAYED RELEASE: ORAL | 30 days supply | Qty: 180 | Fill #1

## 2019-12-30 MED FILL — TACROLIMUS 0.5 MG CAPSULE, IMMEDIATE-RELEASE: ORAL | 30 days supply | Qty: 180 | Fill #4

## 2019-12-30 MED FILL — CHLORTHALIDONE 25 MG TABLET: ORAL | 30 days supply | Qty: 15 | Fill #3

## 2019-12-30 MED FILL — MYCOPHENOLATE SODIUM 180 MG TABLET,DELAYED RELEASE: 30 days supply | Qty: 180 | Fill #1 | Status: AC

## 2019-12-30 MED FILL — TACROLIMUS 0.5 MG CAPSULE, IMMEDIATE-RELEASE: 30 days supply | Qty: 180 | Fill #4 | Status: AC

## 2020-01-28 MED ORDER — MAGNESIUM OXIDE 400 MG (241.3 MG MAGNESIUM) TABLET
ORAL_TABLET | Freq: Every morning | ORAL | 11 refills | 30.00000 days | Status: CP
Start: 2020-01-28 — End: 2021-01-27
  Filled 2020-01-31: qty 30, 30d supply, fill #0

## 2020-01-28 MED FILL — CHLORTHALIDONE 25 MG TABLET: 30 days supply | Qty: 15 | Fill #4 | Status: AC

## 2020-01-28 MED FILL — MYCOPHENOLATE SODIUM 180 MG TABLET,DELAYED RELEASE: ORAL | 30 days supply | Qty: 180 | Fill #2

## 2020-01-28 MED FILL — CHLORTHALIDONE 25 MG TABLET: ORAL | 30 days supply | Qty: 15 | Fill #4

## 2020-01-28 MED FILL — MYCOPHENOLATE SODIUM 180 MG TABLET,DELAYED RELEASE: 30 days supply | Qty: 180 | Fill #2 | Status: AC

## 2020-01-28 MED FILL — TACROLIMUS 0.5 MG CAPSULE, IMMEDIATE-RELEASE: ORAL | 30 days supply | Qty: 180 | Fill #5

## 2020-01-28 MED FILL — TACROLIMUS 0.5 MG CAPSULE, IMMEDIATE-RELEASE: 30 days supply | Qty: 180 | Fill #5 | Status: AC

## 2020-01-28 NOTE — Unmapped (Signed)
[facility] Special[facility] Pharm[facility] Refill Coordination Note    Specialty Medication(s) to be Shipped:   Transplant: mycophenolate mofetil 180mg  and tacrolimus 0.5mg     Other medication(s) to be shipped: chlorthalidone 25mg  and magnesium (sent rf request)     Lynn Erickson, DOB: Jan 28, 1966  Phone: [phone] (home) [phone] (work)      All above HIPAA information was verified with patient.     Was a Nurse, learning disability used for this call? No    Completed refill call assessment today to schedule patient's medication shipment from the [facility] [phone]).       Specialty medication(s) and dose(s) confirmed: Regimen is correct and unchanged.   Changes to medications: Lynn Erickson reports no changes at this time.  Changes to insurance: No  Questions for the pharmacist: No    Confirmed patient received Welcome Packet with first shipment. The patient will receive a drug information handout for each medication shipped and additional FDA Medication Guides as required.       DISEASE/MEDICATION-SPECIFIC INFORMATION        N/A    SPECIALTY MEDICATION ADHERENCE     Medication Adherence    Patient reported X missed doses in the last month: 0  Specialty Medication: Mycophenolate 180mg   Patient is on additional specialty medications: Yes  Additional Specialty Medications: Tacrolimus 0.5mg   Patient Reported Additional Medication X Missed Doses in the Last Month: 0  Patient is on more than two specialty medications: No  Adherence tools used: patient uses a pill box to manage medications        Mycophenolate 180 mg: 9 days of medicine on hand   Tacrolimus 0.5 mg: 9 days of medicine on hand     SHIPPING     Shipping address confirmed in Epic.     Delivery Scheduled: Yes, Expected medication delivery date: 01/31/2020.     Medication will be delivered via UPS to the prescription address in Epic WAM.    [name] [facility] Specialty Technician

## 2020-01-28 NOTE — Unmapped (Signed)
Worthy Rancher, Please see refill request for magnesium

## 2020-01-31 MED FILL — MAGNESIUM OXIDE 400 MG (241.3 MG MAGNESIUM) TABLET: 30 days supply | Qty: 30 | Fill #0 | Status: AC

## 2020-02-15 NOTE — Unmapped (Signed)
[facility] Pharmacy Clinical Assessment & Refill Coordination Note    Lynn Erickson, DOB: August 01, 1965  Phone: [phone] (home) [phone] (work)    All above HIPAA information was verified with patient.     Was a Nurse, learning disability used for this call? No    Specialty Medication(s):   Transplant:  mycophenolic acid 180mg , tacrolimus 0.5mg  and Cinacalcet 30mg      Current Outpatient Medications   Medication Sig Dispense Refill   ??? calcium carbonate (TUMS) 200 mg calcium (500 mg) chewable tablet Chew 1 tablet Three (3) times a day before meals.     ??? chlorthalidone (HYGROTON) 25 MG tablet Take one-half tablet (12.5 mg total) by mouth every morning. (Patient not taking: Reported on 10/28/2019) 15 tablet 11   ??? cinacalcet (SENSIPAR) 30 MG tablet Take 1 tablet (30 mg total) by mouth daily. 90 tablet 3   ??? famotidine (PEPCID) 20 MG tablet Take 1 tablet (20 mg total) by mouth Two (2) times a day. 60 tablet 11   ??? magnesium oxide (MAG-OX) 400 mg (241.3 mg elemental magnesium) tablet Take 1 tablet by mouth once daily in the morning 30 tablet 11   ??? mycophenolate (MYFORTIC) 180 MG EC tablet Take 3 tablets (540 mg total) by mouth two (2) times a day. 180 tablet 11   ??? tacrolimus (PROGRAF) 0.5 MG capsule Take 3 capsules (1.5 mg total) by mouth two (2) times a day. 180 capsule 11     No current facility-administered medications for this visit.        Changes to medications: Lynn Erickson reports no changes at this time.    Allergies   Allergen Reactions   ??? Plaquenil [Hydroxychloroquine] Other (See Comments)     Toxic maculopathy   ??? Shellfish Containing Products Anaphylaxis   ??? Bleomycin    ??? Aspirin Rash   ??? Penicillins Rash     skin testing 12/13/2016 negative, oral challenging pending for 03/15/2017   ??? 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       Changes to allergies: No    SPECIALTY MEDICATION ADHERENCE     Mycophenolate 180 mg: 15 days of medicine on hand   Tacrolimus 0.5 mg: 15 days of medicine on hand   Cinacalcet 30 mg: 15 days of medicine on hand       Medication Adherence    Patient reported X missed doses in the last month: 0  Specialty Medication: Mycophenolate 180mg   Patient is on additional specialty medications: Yes  Additional Specialty Medications: Tacrolimus 0.5mg   Patient Reported Additional Medication X Missed Doses in the Last Month: 0  Patient is on more than two specialty medications: Yes  Specialty Medication: Cinacalcet 30mg   Patient Reported Additional Medication X Missed Doses in the Last Month: 0  Adherence tools used: patient uses a pill box to manage medications          Specialty medication(s) dose(s) confirmed: Regimen is correct and unchanged.     Are there any concerns with adherence? No    Adherence counseling provided? Not needed    CLINICAL MANAGEMENT AND INTERVENTION      Clinical Benefit Assessment:    Do you feel the medicine is effective or helping your condition? Yes    Clinical Benefit counseling provided? Not needed    Adverse Effects Assessment:    Are you experiencing any side effects? No    Are you experiencing difficulty administering your medicine? No  Quality of Life Assessment:    How many days over the past month did your kidney transplant  keep you from your normal activities? For example, brushing your teeth or getting up in the morning. 0    Have you discussed this with your provider? Not needed    Therapy Appropriateness:    Is therapy appropriate? Yes, therapy is appropriate and should be continued    DISEASE/MEDICATION-SPECIFIC INFORMATION      N/A    PATIENT SPECIFIC NEEDS     - Does the patient have any physical, cognitive, or cultural barriers? No    - Is the patient high risk? Yes, patient is taking a REMS drug. Medication is dispensed in compliance with REMS program    - Does the patient require a Care Management Plan? No     - Does the patient require physician intervention or other additional services (i.e. nutrition, smoking cessation, social work)? No      SHIPPING     Specialty Medication(s) to be Shipped:   Transplant: None- Patient declined mycophenolate, tacrolimus and cinacalcet today.    Other medication(s) to be shipped: No additional medications requested for fill at this time     Changes to insurance: No    Delivery Scheduled: Patient declined refill at this time due to has more than 2 weeks of medicine on hand..     Medication will be delivered via UPS to the confirmed prescription address in [facility].    The patient will receive a drug information handout for each medication shipped and additional FDA Medication Guides as required.  Verified that patient has previously received a Conservation officer, historic buildings.    All of the patient's questions and concerns have been addressed.    Tera Helper   [facility] Specialty Pharmacist

## 2020-02-21 ENCOUNTER — Ambulatory Visit: Admit: 2020-02-21 | Discharge: 2020-02-22 | Payer: MEDICAID

## 2020-02-21 LAB — URINALYSIS
BILIRUBIN UA: NEGATIVE
BLOOD UA: NEGATIVE
GLUCOSE UA: NEGATIVE
KETONES UA: NEGATIVE
LEUKOCYTE ESTERASE UA: NEGATIVE
NITRITE UA: NEGATIVE
PROTEIN UA: NEGATIVE
RBC UA: <1 /HPF (ref <4)
SPECIFIC GRAVITY UA: 1.015 (ref 1.005–1.040)
SQUAMOUS EPITHELIAL: 2 /HPF (ref 0–5)
UROBILINOGEN UA: 0.2
WBC UA: <1 /HPF (ref 0–5)

## 2020-02-21 LAB — CBC W/ AUTO DIFF
BASOPHILS ABSOLUTE COUNT: 0 10*9/L (ref 0.0–0.1)
BASOPHILS RELATIVE PERCENT: 0.6 %
HEMATOCRIT: 35.1 % (ref 35.0–44.0)
HEMOGLOBIN: 10.9 g/dL — ABNORMAL LOW (ref 12.0–15.5)
LYMPHOCYTES ABSOLUTE COUNT: 0.9 10*9/L (ref 0.7–4.0)
LYMPHOCYTES RELATIVE PERCENT: 21 %
MEAN CORPUSCULAR HEMOGLOBIN CONC: 31 g/dL (ref 30.0–36.0)
MEAN CORPUSCULAR HEMOGLOBIN: 18.9 pg — ABNORMAL LOW (ref 26.0–34.0)
MEAN CORPUSCULAR VOLUME: 61 fL — ABNORMAL LOW (ref 82.0–98.0)
MEAN PLATELET VOLUME: 9 fL (ref 7.0–10.0)
MONOCYTES ABSOLUTE COUNT: 0.5 10*9/L (ref 0.1–1.0)
NEUTROPHILS ABSOLUTE COUNT: 2.5 10*9/L (ref 1.7–7.7)
NEUTROPHILS RELATIVE PERCENT: 59.9 %
NUCLEATED RED BLOOD CELLS: 0 /100{WBCs} (ref <=4)
PLATELET COUNT: 189 10*9/L (ref 150–450)
RED BLOOD CELL COUNT: 5.75 10*12/L — ABNORMAL HIGH (ref 3.90–5.03)
RED CELL DISTRIBUTION WIDTH: 16.4 % — ABNORMAL HIGH (ref 12.0–15.0)
WBC ADJUSTED: 4.2 10*9/L (ref 3.5–10.5)

## 2020-02-21 LAB — COMPREHENSIVE METABOLIC PANEL
ALBUMIN: 4.3 g/dL (ref 3.4–5.0)
ALKALINE PHOSPHATASE: 134 U/L — ABNORMAL HIGH (ref 46–116)
ALT (SGPT): 13 U/L (ref 10–49)
ANION GAP: 9 mmol/L (ref 5–14)
AST (SGOT): 21 U/L (ref <=34)
BILIRUBIN TOTAL: 0.6 mg/dL (ref 0.3–1.2)
BLOOD UREA NITROGEN: 14 mg/dL (ref 9–23)
BUN / CREAT RATIO: 19
CALCIUM: 10.2 mg/dL (ref 8.7–10.4)
CHLORIDE: 109 mmol/L — ABNORMAL HIGH (ref 98–107)
CO2: 21.8 mmol/L (ref 20.0–31.0)
EGFR CKD-EPI AA FEMALE: >90 mL/min/{1.73_m2} (ref >=60)
EGFR CKD-EPI NON-AA FEMALE: >90 mL/min/{1.73_m2} (ref >=60)
GLUCOSE RANDOM: 97 mg/dL (ref 70–179)
POTASSIUM: 4.4 mmol/L (ref 3.4–4.5)
PROTEIN TOTAL: 7.5 g/dL (ref 5.7–8.2)
SODIUM: 140 mmol/L (ref 135–145)

## 2020-02-21 LAB — NEUTROPHILS ABSOLUTE COUNT: Neutrophils:NCnc:Pt:Bld:Qn:Automated count: 2.5

## 2020-02-21 LAB — RBC UA: Erythrocytes:Naric:Pt:Urine sed:Qn:Microscopy.light.HPF: <1

## 2020-02-21 LAB — MAGNESIUM: Magnesium:MCnc:Pt:Ser/Plas:Qn:: 1.5 — ABNORMAL LOW

## 2020-02-21 LAB — SMEAR REVIEW

## 2020-02-21 LAB — SLIDE REVIEW

## 2020-02-21 LAB — PHOSPHORUS: Phosphate:MCnc:Pt:Ser/Plas:Qn:: 3.9

## 2020-02-21 LAB — CHLORIDE: Chloride:SCnc:Pt:Ser/Plas:Qn:: 109 — ABNORMAL HIGH

## 2020-02-22 LAB — CMV QUANT LOG10: Lab: 0

## 2020-02-22 LAB — BK BLOOD RESULT: Lab: NOT DETECTED

## 2020-02-22 LAB — BK VIRUS QUANTITATIVE PCR, BLOOD

## 2020-02-22 LAB — CMV DNA, QUANTITATIVE, PCR: CMV VIRAL LD: DETECTED — AB

## 2020-02-22 LAB — TACROLIMUS, TROUGH: Lab: 3.9 — ABNORMAL LOW

## 2020-02-22 LAB — TACROLIMUS LEVEL, TROUGH: TACROLIMUS, TROUGH: 3.9 ng/mL — ABNORMAL LOW (ref 5.0–15.0)

## 2020-03-01 NOTE — Unmapped (Signed)
[facility] Special[facility] Pharmacy Refill Coordination Note    Specialty Medication(s) to be Shipped:   Transplant:  mycophenolic acid 180mg  and tacrolimus 0.5mg     Other medication(s) to be shipped: magnesium oxide 400mg      Lynn Erickson, DOB: 04-16-1966  Phone: [phone] (home)       All above HIPAA information was verified with patient.     Was a Nurse, learning disability used for this call? No    Completed refill call assessment today to schedule patient's medication shipment from the [facility] [phone]).       Specialty medication(s) and dose(s) confirmed: Regimen is correct and unchanged.   Changes to medications: Suzi reports no changes at this time.  Changes to insurance: No  Questions for the pharmacist: No    Confirmed patient received Welcome Packet with first shipment. The patient will receive a drug information handout for each medication shipped and additional FDA Medication Guides as required.       DISEASE/MEDICATION-SPECIFIC INFORMATION        N/A    SPECIALTY MEDICATION ADHERENCE     Medication Adherence    Patient reported X missed doses in the last month: 0  Specialty Medication: Mycophenolate 180mg   Patient is on additional specialty medications: Yes  Additional Specialty Medications: Tacrolimus 0.5mg   Patient Reported Additional Medication X Missed Doses in the Last Month: 0  Patient is on more than two specialty medications: No  Informant: patient  Adherence tools used: patient uses a pill box to manage medications                Mycophenolate 180 mg: 10 days of medicine on hand   Tacrolimus 0.5 mg: 10 days of medicine on hand         SHIPPING     Shipping address confirmed in Epic.     Delivery Scheduled: Yes, Expected medication delivery date: 03/10/20.     Medication will be delivered via UPS to the prescription address in Epic WAM.    Jasper Loser   [facility] Specialty Technician

## 2020-03-02 MED ORDER — TACROLIMUS 0.5 MG CAPSULE, IMMEDIATE-RELEASE
ORAL_CAPSULE | 11 refills | 0 days | Status: CP
Start: 2020-03-02 — End: ?
  Filled 2020-03-09: qty 210, 30d supply, fill #0

## 2020-03-02 NOTE — Unmapped (Addendum)
Reviewing recent labs with Dr. Toni Arthurs,   Patient to  increase tacrolims from 1.5mg  bid to 2/1.5 am/pm for goal 12hr lvl in 5-8 range.    Recent tacrolimus levels    Ref. Range 02/21/2020 09:55    Tacrolimus, Trough Latest Ref Range: 5.0 - 15.0 ng/mL 3.9 (L)      Updated Rx     Called patient,left VM and phone # requesting confirmation of change.

## 2020-03-03 NOTE — Unmapped (Signed)
Spoke with patient about her tacrolimus dose change (4/3).  Patient aware.  Patient has 8 days of tacrolimus on hand and does not need an earlier delivery.

## 2020-03-03 NOTE — Unmapped (Addendum)
Lynn Erickson 's Tacrolimus shipment will be canceled  as a result of a new dose for the medication has been received.     Clinical Assessment Needed For: Dose Change  Medication: Tacrolimus 0.5 mg capsules  Last Fill Date: 01/28/2020  Copay $3.00  Was previous dose already scheduled to fill: Yes    Notes to Pharmacist: Previous dose was scheduled to fill 03/09/2020 and new dose will need to be rescheduled for delivery.      8/27 update: per coordinator note, patient is aware of this dose change. I have added the new rx to existing work order for delivery as already set up. However since dose increase, I also lvm for patient to make sure she doesn't need medication sooner. At this point, awaiting patient call back -ef

## 2020-03-07 DIAGNOSIS — Z940 Kidney transplant status: Principal | ICD-10-CM

## 2020-03-08 ENCOUNTER — Ambulatory Visit: Admit: 2020-03-08 | Discharge: 2020-03-09 | Payer: MEDICAID

## 2020-03-08 LAB — URINALYSIS
BACTERIA: NONE SEEN /HPF
BLOOD UA: NEGATIVE
GLUCOSE UA: NEGATIVE
KETONES UA: NEGATIVE
PH UA: 5.5 (ref 5.0–9.0)
PROTEIN UA: NEGATIVE
SPECIFIC GRAVITY UA: 1.015 (ref 1.005–1.040)
SQUAMOUS EPITHELIAL: 1 /HPF (ref 0–5)
UROBILINOGEN UA: 0.2
WBC UA: 15 /HPF — ABNORMAL HIGH (ref 0–5)

## 2020-03-08 LAB — COMPREHENSIVE METABOLIC PANEL
ALBUMIN: 4.3 g/dL (ref 3.4–5.0)
ALKALINE PHOSPHATASE: 132 U/L — ABNORMAL HIGH (ref 46–116)
ANION GAP: 8 mmol/L (ref 5–14)
AST (SGOT): 22 U/L (ref <=34)
BILIRUBIN TOTAL: 0.7 mg/dL (ref 0.3–1.2)
BLOOD UREA NITROGEN: 13 mg/dL (ref 9–23)
BUN / CREAT RATIO: 15
CALCIUM: 10.1 mg/dL (ref 8.7–10.4)
CHLORIDE: 109 mmol/L — ABNORMAL HIGH (ref 98–107)
CO2: 22.2 mmol/L (ref 20.0–31.0)
CREATININE: 0.85 mg/dL — ABNORMAL HIGH
EGFR CKD-EPI AA FEMALE: 89 mL/min/{1.73_m2} (ref >=60)
EGFR CKD-EPI NON-AA FEMALE: 77 mL/min/{1.73_m2} (ref >=60)
GLUCOSE RANDOM: 92 mg/dL (ref 70–179)
POTASSIUM: 4.3 mmol/L (ref 3.4–4.5)
PROTEIN TOTAL: 7.5 g/dL (ref 5.7–8.2)

## 2020-03-08 LAB — SLIDE REVIEW

## 2020-03-08 LAB — CBC W/ AUTO DIFF
BASOPHILS ABSOLUTE COUNT: 0 10*9/L (ref 0.0–0.1)
BASOPHILS RELATIVE PERCENT: 0.9 %
EOSINOPHILS ABSOLUTE COUNT: 0.3 10*9/L (ref 0.0–0.7)
EOSINOPHILS RELATIVE PERCENT: 8.4 %
HEMATOCRIT: 34.7 % — ABNORMAL LOW (ref 35.0–44.0)
HEMOGLOBIN: 10.9 g/dL — ABNORMAL LOW (ref 12.0–15.5)
LYMPHOCYTES ABSOLUTE COUNT: 0.9 10*9/L (ref 0.7–4.0)
LYMPHOCYTES RELATIVE PERCENT: 27 %
MEAN CORPUSCULAR HEMOGLOBIN CONC: 31.3 g/dL (ref 30.0–36.0)
MEAN CORPUSCULAR HEMOGLOBIN: 19 pg — ABNORMAL LOW (ref 26.0–34.0)
MEAN CORPUSCULAR VOLUME: 60.8 fL — ABNORMAL LOW (ref 82.0–98.0)
MEAN PLATELET VOLUME: 8.7 fL (ref 7.0–10.0)
MONOCYTES ABSOLUTE COUNT: 0.4 10*9/L (ref 0.1–1.0)
MONOCYTES RELATIVE PERCENT: 12.1 %
NUCLEATED RED BLOOD CELLS: 0 /100{WBCs} (ref <=4)
PLATELET COUNT: 186 10*9/L (ref 150–450)
RED BLOOD CELL COUNT: 5.71 10*12/L — ABNORMAL HIGH (ref 3.90–5.03)
RED CELL DISTRIBUTION WIDTH: 16.6 % — ABNORMAL HIGH (ref 12.0–15.0)
WBC ADJUSTED: 3.4 10*9/L — ABNORMAL LOW (ref 3.5–10.5)

## 2020-03-08 LAB — UROBILINOGEN UA: Lab: 0.2

## 2020-03-08 LAB — SMEAR REVIEW

## 2020-03-08 LAB — PROTEIN TOTAL: Protein:MCnc:Pt:Ser/Plas:Qn:: 7.5

## 2020-03-08 LAB — RED BLOOD CELL COUNT: Lab: 5.71 — ABNORMAL HIGH

## 2020-03-08 LAB — MAGNESIUM: Magnesium:MCnc:Pt:Ser/Plas:Qn:: 1.4 — ABNORMAL LOW

## 2020-03-08 LAB — PHOSPHORUS: Phosphate:MCnc:Pt:Ser/Plas:Qn:: 3.9

## 2020-03-09 ENCOUNTER — Ambulatory Visit: Admit: 2020-03-09 | Discharge: 2020-03-10 | Payer: MEDICAID | Attending: Nephrology | Primary: Nephrology

## 2020-03-09 DIAGNOSIS — Z940 Kidney transplant status: Principal | ICD-10-CM

## 2020-03-09 DIAGNOSIS — D849 Immunodeficiency, unspecified: Principal | ICD-10-CM

## 2020-03-09 LAB — URINALYSIS
BILIRUBIN UA: NEGATIVE
BLOOD UA: NEGATIVE
GLUCOSE UA: NEGATIVE
NITRITE UA: NEGATIVE
PH UA: 6 (ref 5.0–9.0)
PROTEIN UA: NEGATIVE
RBC UA: <1 /HPF (ref <4)
SPECIFIC GRAVITY UA: 1.01 (ref 1.005–1.030)
SQUAMOUS EPITHELIAL: 1 /HPF (ref 0–5)
UROBILINOGEN UA: 0.2
WBC UA: 7 /HPF — ABNORMAL HIGH (ref 0–5)

## 2020-03-09 LAB — PROTEIN / CREATININE RATIO, URINE

## 2020-03-09 LAB — THYROID STIMULATING HORMONE: Thyrotropin:ACnc:Pt:Ser/Plas:Qn:: 0.966

## 2020-03-09 LAB — CREATININE, URINE: Lab: 56.4

## 2020-03-09 LAB — PH UA: pH:LsCnc:Pt:Urine:Qn:Test strip: 6

## 2020-03-09 LAB — TACROLIMUS LEVEL, TROUGH: TACROLIMUS, TROUGH: 7.1 ng/mL (ref 5.0–15.0)

## 2020-03-09 LAB — TACROLIMUS, TROUGH: Lab: 7.1

## 2020-03-09 LAB — PROTEIN URINE: Protein:MCnc:Pt:Urine:Qn:: <6

## 2020-03-09 LAB — VITAMIN B-12: Cobalamins:MCnc:Pt:Ser/Plas:Qn:: 871

## 2020-03-09 MED FILL — MYCOPHENOLATE SODIUM 180 MG TABLET,DELAYED RELEASE: 30 days supply | Qty: 180 | Fill #3 | Status: AC

## 2020-03-09 MED FILL — MAGNESIUM OXIDE 400 MG (241.3 MG MAGNESIUM) TABLET: ORAL | 30 days supply | Qty: 30 | Fill #1

## 2020-03-09 MED FILL — MAGNESIUM OXIDE 400 MG (241.3 MG MAGNESIUM) TABLET: 30 days supply | Qty: 30 | Fill #1 | Status: AC

## 2020-03-09 MED FILL — MYCOPHENOLATE SODIUM 180 MG TABLET,DELAYED RELEASE: ORAL | 30 days supply | Qty: 180 | Fill #3

## 2020-03-09 MED FILL — TACROLIMUS 0.5 MG CAPSULE, IMMEDIATE-RELEASE: 30 days supply | Qty: 210 | Fill #0 | Status: AC

## 2020-03-09 NOTE — Unmapped (Signed)
AOBP performed R arm, medium cuff.  Average - 129/90, p 85   1st reading - 133/99, p 87   2nd reading - 135/89, p 82    3rd reading- 119/82, p 85

## 2020-03-09 NOTE — Unmapped (Signed)
[facility] [place] nephrology clinic visit    assessment and plan  1. s/p kidney transplant 10/12/2016. baseline creatinine 0.7-0.8 mg/dl. no proteinuria. no donor specific hla ab detected.   2. immunosuppression. mycophenolate sodium 540mg  bid. tacrolimus 12hr lvl 5-8 ng/ml.  3. hypertension. blood pressure goal < 130/80 mmhg.   4. diarrhea nos. stool gi pathogen panel, c.difficile screen. +follow up colonoscopy due/recommended.  5. ?ulnar neuropathy. [facility] referral.  6. preventive medicine. influenza '19. pcv13 pneumococcal '17. ppsv23 pneumococcal '19. zoster '19. covid-19 vaccine strongly recommended. mammogram '19. gyn exam/pap smear '19. colonoscopy '16 with 5 year follow up recommended. kidney ultrasound '20.    history of present illness    [name] is a 54 year old woman seen in follow up post kidney transplant 10/12/2016. she did not tolerate chlorthalidone d/t muscle cramping. no fever chills or sweats. no headache or lightheaded. +post prandial abdominal bloating/pain with diarrhea x past month. no n/v. +heartburn. no chest pain palpitations or shortness of breath. no lower extremity edema. +neck pain with left upper ext radiation. +4th/5th finger numbness/left hand. no weakness. no dysuria hematuria or difficulty voiding. all other systems reviewed and negative x10 systems.    past medical hx:  1. 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 baseline creatinine 0.7-0.8 mg/dl.  2. systemic lupus erythematosus   3. hypertension  4. secondary/tertiary hyperparathyroidism    past surgical hx: left upper ext av graft x2 '07-08. bilateral tubal ligation. kidney transplant '18. left upper ext avg ligation '19.  ??  allergies: penicillin vancomycin aspirin shellfish hydroxychloroquine.  ??  medications: tacrolimus 1.5mg  bid, mycophenolate sodium 540mg  bid, cinacalcet 30mg  daily, mg oxide 400mg  daily, famotidine 20mg  prn.     soc hx: married x4 children. no smoking hx     physical exam: t96 p73 bp128/77 wt69.1kg bmi 35.4. wd/wn woman appropriate affect and mood. nl sclera anicteric. wearing mask. neck supple no palpable ln. heart rrr nl s1s2 no m/r/g. lungs clear bilateral. abd soft nt/nd. no lower ext edema. msk no synovitis/tophi. skin no rash. neuro alert oriented non focal exam.    labs 10/26/19: wbc4.0 hgb10.3 hct33 plts191. ZO109 k4.6 cl109 bicarb21 bun27 cr1.0 glc93 ca10.1 mg1.4 phos3.7 albumin4.6 liver function panel nl. cmv/bkv pcr not detected. tacrolimus lvl 6.9 ng/ml. urinalysis 6/1.010 no protein glucose or blood.

## 2020-03-10 LAB — BK VIRUS QUANTITATIVE PCR, BLOOD: BK BLOOD RESULT: NOT DETECTED

## 2020-03-10 LAB — BK BLOOD LOG(10): Lab: 0

## 2020-03-11 LAB — CMV VIRAL LD: Lab: DETECTED — AB

## 2020-03-11 LAB — CMV DNA, QUANTITATIVE, PCR
CMV QUANT: <50 [IU]/mL — ABNORMAL HIGH (ref <0)
CMV VIRAL LD: DETECTED — AB

## 2020-03-16 NOTE — Unmapped (Signed)
[facility] [place] transplant nephrology clinic visit    assessment and plan  1. s/p kidney transplant 10/12/2016. baseline creatinine 0.7-0.8 mg/dl. no proteinuria. no donor specific hla ab detected.   2. immunosuppression. mycophenolate sodium 540mg  bid. tacrolimus 12hr lvl 5-8 ng/ml.  3. hypertension. blood pressure goal < 130/80 mmhg.   4. decreased short term memory. neurology follow up strongly recommended. she was encouraged to discuss sleep study evaluation vs potential obstructive sleep apnea. +/-brain imaging.  5. preventive medicine. influenza '19. pcv13 pneumococcal '17. ppsv23 pneumococcal '19. zoster '19. covid-19 vaccine declined/strongly recommended. mammogram '19. gyn exam/pap smear '19. kidney ultrasound '20. colonoscopy '16 with 5 year follow up recommended.    history of present illness    [name] is a 54 year old woman seen in follow up post kidney transplant 10/12/2016. she saw [facility] neurology in past 18m d/t decr memory concerns. 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 she does not recall having head ct or brain mri imaging. she was prescribed a medication ?donepezil which she did not start. she was scheduled to return for follow up appt/?evaluation but did not keep that appt. +decr short term memory. +fatigue. no weakness or parasthesias. no falls. +snoring at night; husband reports sleep apnea behavior. +daytime sedation/will fall asleep easily. +headache worse in am. +myoclonic activity noted with awakening incl tongue biting.    no fever chills or sweats. no chest pain palpitations cough or shortness of breath. no orthopnea or lower extremity edema. appetite ok. +post prandial abdominal bloating/diarrhea. no myalgias or arthralgias. no dysuria hematuria or difficulty voiding. all other systems reviewed and negative x10 systems.    past medical hx:  1. 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 baseline creatinine 0.7-0.8 mg/dl.  2. systemic lupus erythematosus   3. hypertension  4. secondary/tertiary hyperparathyroidism    past surgical hx: left upper ext av graft x2 '07-08. bilateral tubal ligation. kidney transplant '18. left upper ext avg ligation '19.  ??  allergies: penicillin vancomycin aspirin shellfish hydroxychloroquine.  ??  medications: tacrolimus 2mg /1.5mg  am/pm, mycophenolate sodium 540mg  bid, chlorthalidone 12.5mg  daily, cinacalcet 30mg  daily, mg oxide 400mg  daily, famotidine 20mg  prn.     soc hx: married x4 children. no smoking hx     physical exam: t97.4 p85 bp129/90 wt69.7kg bmi 35.7. wd/wn woman appropriate affect and mood. nl sclera anicteric. mmm no thrush. neck supple no palpable ln. heart rrr nl s1s2 no m/r/g. lungs clear bilateral. abd soft nt/nd. no lower ext edema. msk no synovitis/tophi. skin no rash. neuro alert oriented non focal exam.    labs 03/08/20: cr0.85. tacrolimus lvl 7.1 ng/ml. urine protein not detected.

## 2020-03-24 MED FILL — CINACALCET 30 MG TABLET: ORAL | 90 days supply | Qty: 90 | Fill #2

## 2020-03-24 MED FILL — CINACALCET 30 MG TABLET: 90 days supply | Qty: 90 | Fill #2 | Status: AC

## 2020-03-24 NOTE — Unmapped (Signed)
[facility] Clinical Assessment & Refill Coordination Note    Lynn Erickson, DOB: 07-30-1965  Phone: [phone] (home)     All above HIPAA information was verified with patient.     Was a Nurse, learning disability used for this call? No    Specialty Medication(s):   Transplant:  mycophenolic acid 180mg , tacrolimus 0.5mg  and Cinacalcet 30mg      Current Outpatient Medications   Medication Sig Dispense Refill   ??? calcium carbonate (TUMS) 200 mg calcium (500 mg) chewable tablet Chew 1 tablet Three (3) times a day before meals.     ??? chlorthalidone (HYGROTON) 25 MG tablet Take one-half tablet (12.5 mg total) by mouth every morning. 15 tablet 11   ??? cinacalcet (SENSIPAR) 30 MG tablet Take 1 tablet (30 mg total) by mouth daily. 90 tablet 3   ??? famotidine (PEPCID) 20 MG tablet Take 1 tablet (20 mg total) by mouth Two (2) times a day. (Patient taking differently: Take 20 mg by mouth two (2) times a day as needed. ) 60 tablet 11   ??? magnesium oxide (MAG-OX) 400 mg (241.3 mg elemental magnesium) tablet Take 1 tablet by mouth once daily in the morning 30 tablet 11   ??? mycophenolate (MYFORTIC) 180 MG EC tablet Take 3 tablets (540 mg total) by mouth two (2) times a day. 180 tablet 11   ??? tacrolimus (PROGRAF) 0.5 MG capsule Take 4 capsules (2 mg) by mouth in the morning and 3 capsules (1.5 mg) in the evening. 210 capsule 11     No current facility-administered medications for this visit.        Changes to medications: Johnetta reports no changes at this time.    Allergies   Allergen Reactions   ??? Plaquenil [Hydroxychloroquine] Other (See Comments)     Toxic maculopathy   ??? Shellfish Containing Products Anaphylaxis   ??? Bleomycin    ??? Aspirin Rash   ??? Penicillins Rash     skin testing 12/13/2016 negative, oral challenging pending for 03/15/2017   ??? 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       Changes to allergies: No    SPECIALTY MEDICATION ADHERENCE     Mycophenolate 180 mg: 15 days of medicine on hand   Tacrolimus 0.5 mg: 15 days of medicine on hand   Cinacalcet 30 mg: 2 days of medicine on hand     Medication Adherence    Patient reported X missed doses in the last month: 0  Specialty Medication: Cinacalcet 30mg   Patient is on additional specialty medications: Yes  Additional Specialty Medications: Mycophenolate 180mg   Patient Reported Additional Medication X Missed Doses in the Last Month: 0  Patient is on more than two specialty medications: Yes  Specialty Medication: Tacrolimus 0.5mg   Patient Reported Additional Medication X Missed Doses in the Last Month: 0  Adherence tools used: patient uses a pill box to manage medications          Specialty medication(s) dose(s) confirmed: Regimen is correct and unchanged.     Are there any concerns with adherence? No    Adherence counseling provided? Not needed    CLINICAL MANAGEMENT AND INTERVENTION      Clinical Benefit Assessment:    Do you feel the medicine is effective or helping your condition? Yes    Clinical Benefit counseling provided? Not needed    Adverse Effects Assessment:    Are you experiencing any side effects? No  Are you experiencing difficulty administering your medicine? No    Quality of Life Assessment:    How many days over the past month did your kidney transplant  keep you from your normal activities? For example, brushing your teeth or getting up in the morning. 0    Have you discussed this with your provider? Not needed    Therapy Appropriateness:    Is therapy appropriate? Yes, therapy is appropriate and should be continued    DISEASE/MEDICATION-SPECIFIC INFORMATION      N/A    PATIENT SPECIFIC NEEDS     - Does the patient have any physical, cognitive, or cultural barriers? No    - Is the patient high risk? Yes, patient is taking a REMS drug. Medication is dispensed in compliance with REMS program    - Does the patient require a Care Management Plan? No     - Does the patient require physician intervention or other additional services (i.e. nutrition, smoking cessation, social work)? No      SHIPPING     Specialty Medication(s) to be Shipped:   Transplant: Cinacalcet 30mg     Other medication(s) to be shipped: No additional medications requested for fill at this time     Changes to insurance: No    Delivery Scheduled: Yes, Expected medication delivery date: 03/24/20.     Medication will be delivered via Same Day Courier to the confirmed prescription address in [facility].    The patient will receive a drug information handout for each medication shipped and additional FDA Medication Guides as required.  Verified that patient has previously received a Conservation officer, historic buildings.    All of the patient's questions and concerns have been addressed.    Tera Helper   [facility] Specialty Pharmacist

## 2020-03-28 NOTE — Unmapped (Signed)
9/21: pt has 3 weeks meds on hand. [place]. Wants call back that week or week before to set up delivery for soon when she gets home. She also said she will call us on trip home if we haven't already set up delivery by then. States will not run short of medication on her trip -ef    [facility] Clinical Assessment & Refill Coordination Note    Lynn Erickson, DOB: Feb 12, 1966  Phone: [phone] (home)     All above HIPAA information was verified with patient.     Was a Nurse, learning disability used for this call? No    Specialty Medication(s):   Transplant:  mycophenolic acid 180mg  and tacrolimus 0.5mg      Current Outpatient Medications   Medication Sig Dispense Refill   ??? calcium carbonate (TUMS) 200 mg calcium (500 mg) chewable tablet Chew 1 tablet Three (3) times a day before meals.     ??? chlorthalidone (HYGROTON) 25 MG tablet Take one-half tablet (12.5 mg total) by mouth every morning. 15 tablet 11   ??? cinacalcet (SENSIPAR) 30 MG tablet Take 1 tablet (30 mg total) by mouth daily. 90 tablet 3   ??? famotidine (PEPCID) 20 MG tablet Take 1 tablet (20 mg total) by mouth Two (2) times a day. (Patient taking differently: Take 20 mg by mouth two (2) times a day as needed. ) 60 tablet 11   ??? magnesium oxide (MAG-OX) 400 mg (241.3 mg elemental magnesium) tablet Take 1 tablet by mouth once daily in the morning 30 tablet 11   ??? mycophenolate (MYFORTIC) 180 MG EC tablet Take 3 tablets (540 mg total) by mouth two (2) times a day. 180 tablet 11   ??? tacrolimus (PROGRAF) 0.5 MG capsule Take 4 capsules (2 mg) by mouth in the morning and 3 capsules (1.5 mg) in the evening. 210 capsule 11     No current facility-administered medications for this visit.        Changes to medications: Denean reports no changes at this time.    Allergies   Allergen Reactions   ??? Plaquenil [Hydroxychloroquine] Other (See Comments)     Toxic maculopathy   ??? Shellfish Containing Products Anaphylaxis   ??? Bleomycin ??? Aspirin Rash   ??? Penicillins Rash     skin testing 12/13/2016 negative, oral challenging pending for 03/15/2017   ??? 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       Changes to allergies: No    SPECIALTY MEDICATION ADHERENCE     Tacrolimus 0.5mg   : 21 days of medicine on hand   Mycophenolate 180mg   : 21 days of medicine on hand       Medication Adherence    Patient reported X missed doses in the last month: 0  Specialty Medication: tacrolimus 0.5mg   Patient is on additional specialty medications: Yes  Additional Specialty Medications: mycophenolate 180mg   Patient Reported Additional Medication X Missed Doses in the Last Month: 0  Adherence tools used: patient uses a pill box to manage medications          Specialty medication(s) dose(s) confirmed: Regimen is correct and unchanged.     Are there any concerns with adherence? No    Adherence counseling provided? Not needed    CLINICAL MANAGEMENT AND INTERVENTION      Clinical Benefit Assessment:    Do you feel the medicine is effective or helping your condition? Yes  Clinical Benefit counseling provided? Not needed    Adverse Effects Assessment:    Are you experiencing any side effects? No    Are you experiencing difficulty administering your medicine? No    Quality of Life Assessment:    How many days over the past month did your transplant  keep you from your normal activities? For example, brushing your teeth or getting up in the morning. 0    Have you discussed this with your provider? Not needed    Therapy Appropriateness:    Is therapy appropriate? Yes, therapy is appropriate and should be continued    DISEASE/MEDICATION-SPECIFIC INFORMATION      N/A    PATIENT SPECIFIC NEEDS     - Does the patient have any physical, cognitive, or cultural barriers? No    - Is the patient high risk? Yes, patient is taking a REMS drug. Medication is dispensed in compliance with REMS program    - Does the patient require a Care Management Plan? No - Does the patient require physician intervention or other additional services (i.e. nutrition, smoking cessation, social work)? No      SHIPPING     Specialty Medication(s) to be Shipped:   na    Other medication(s) to be shipped: No additional medications requested for fill at this time   Pt wants call back in 1.5- 2 weeks     Changes to insurance: No    Delivery Scheduled: Patient declined refill at this time due to supply on hand. wants call back in 1.5-2 weeks.     Medication will be delivered via na to the confirmed na address in [facility].    The patient will receive a drug information handout for each medication shipped and additional FDA Medication Guides as required.  Verified that patient has previously received a Conservation officer, historic buildings.    All of the patient's questions and concerns have been addressed.    Thad Ranger   [facility] Specialty Pharmacist

## 2020-04-07 NOTE — Unmapped (Signed)
The [facility] has made a second and final attempt to reach this patient to refill the following medication: mycophenolate, tacrolimus and maintenance meds.      We have left voicemails on the following phone numbers: [phone].    Dates contacted: 09/28 & 10/01  Last scheduled delivery: 03/10/2020    The patient may be at risk of non-compliance with this medication. The patient should call the [facility] at [phone] (option 4) to refill medication.    Oretha Milch   [facility] Specialty Technician

## 2020-04-14 DIAGNOSIS — Z940 Kidney transplant status: Principal | ICD-10-CM

## 2020-04-14 MED ORDER — TACROLIMUS 0.5 MG CAPSULE, IMMEDIATE-RELEASE: capsule | 11 refills | 0 days | Status: AC

## 2020-04-14 MED ORDER — MYCOPHENOLATE SODIUM 180 MG TABLET,DELAYED RELEASE: 540 mg | tablet | Freq: Two times a day (BID) | 11 refills | 30 days | Status: AC

## 2020-04-14 MED ORDER — TACROLIMUS 0.5 MG CAPSULE, IMMEDIATE-RELEASE
ORAL_CAPSULE | ORAL | 11 refills | 0.00000 days | Status: CP
Start: 2020-04-14 — End: 2020-04-14
  Filled 2020-04-14: qty 210, 30d supply, fill #0

## 2020-04-14 MED ORDER — MAGNESIUM OXIDE 400 MG (241.3 MG MAGNESIUM) TABLET: 400 mg | tablet | Freq: Every morning | 11 refills | 30 days | Status: AC

## 2020-04-14 MED ORDER — MAGNESIUM OXIDE 400 MG (241.3 MG MAGNESIUM) TABLET
ORAL_TABLET | Freq: Every morning | ORAL | 11 refills | 30.00000 days | Status: CN
Start: 2020-04-14 — End: 2021-04-14
  Filled 2020-04-14: qty 30, 30d supply, fill #0

## 2020-04-14 MED ORDER — MYCOPHENOLATE SODIUM 180 MG TABLET,DELAYED RELEASE
ORAL_TABLET | Freq: Two times a day (BID) | ORAL | 11 refills | 30.00000 days | Status: CP
Start: 2020-04-14 — End: 2020-04-14
  Filled 2020-04-14: qty 180, 30d supply, fill #0

## 2020-04-14 MED FILL — TACROLIMUS 0.5 MG CAPSULE, IMMEDIATE-RELEASE: 30 days supply | Qty: 210 | Fill #0 | Status: AC

## 2020-04-14 MED FILL — MYCOPHENOLATE SODIUM 180 MG TABLET,DELAYED RELEASE: 30 days supply | Qty: 180 | Fill #0 | Status: AC

## 2020-04-14 MED FILL — MAGNESIUM OXIDE 400 MG (241.3 MG MAGNESIUM) TABLET: 30 days supply | Qty: 30 | Fill #0 | Status: AC

## 2020-04-14 NOTE — Unmapped (Addendum)
[facility] Refill Coordination Note    Specialty Medication(s) to be Shipped:   Transplant:  mycophenolic acid 180mg  and tacrolimus 0.5mg     Other medication(s) to be shipped: MG     Lynn Erickson, DOB: Dec 01, 1965  Phone: [phone] (home)       All above HIPAA information was verified with patient.     Was a Nurse, learning disability used for this call? No    Completed refill call assessment today to schedule patient's medication shipment from the [facility] [phone]).       Specialty medication(s) and dose(s) confirmed: Regimen is correct and unchanged.   Changes to medications: Lynn Erickson reports no changes at this time.  Changes to insurance: No  Questions for the pharmacist: No    Confirmed patient received Welcome Packet with first shipment. The patient will receive a drug information handout for each medication shipped and additional FDA Medication Guides as required.       DISEASE/MEDICATION-SPECIFIC INFORMATION        N/A    SPECIALTY MEDICATION ADHERENCE     Medication Adherence    Patient reported X missed doses in the last month: 0  Specialty Medication: mycophenolate  Patient is on additional specialty medications: Yes  Additional Specialty Medications: tacrolimus  Patient Reported Additional Medication X Missed Doses in the Last Month: 0  Patient is on more than two specialty medications: No  Any gaps in refill history greater than 2 weeks in the last 3 months: no  Demonstrates understanding of importance of adherence: yes  Informant: patient  Adherence tools used: patient uses a pill box to manage medications                Mycophenolate 180 mg: 2 days of medicine on hand   Tacrolimus 0.5 mg: 1 days of medicine on hand        SHIPPING     Shipping address confirmed in Epic.     Delivery Scheduled: Yes, Expected medication delivery date: 04/14/20.     Medication will be delivered via Same Day Courier to the prescription address in [facility].    Tera Helper   [facility] Specialty Pharmacist

## 2020-05-04 NOTE — Unmapped (Signed)
[facility] Refill Coordination Note    Specialty Medication(s) to be Shipped:   Transplant:  mycophenolic acid 180mg , tacrolimus 0.5mg  and cinacalcet 30mg     Other medication(s) to be shipped: chlorthalidone, magox     Lynn Erickson, DOB: 1966-06-15  Phone: [phone] (home)       All above HIPAA information was verified with patient.     Was a Nurse, learning disability used for this call? No    Completed refill call assessment today to schedule patient's medication shipment from the [facility] [phone]).       Specialty medication(s) and dose(s) confirmed: Regimen is correct and unchanged.   Changes to medications: [name] reports no changes at this time.  Changes to insurance: No  Questions for the pharmacist: No    Confirmed patient received Welcome Packet with first shipment. The patient will receive a drug information handout for each medication shipped and additional FDA Medication Guides as required.       DISEASE/MEDICATION-SPECIFIC INFORMATION        N/A    SPECIALTY MEDICATION ADHERENCE     Medication Adherence    Patient reported X missed doses in the last month: 0  Specialty Medication: tacrolimus 0.5mg   Patient is on additional specialty medications: Yes  Additional Specialty Medications: Mycophenolate 180mg   Patient Reported Additional Medication X Missed Doses in the Last Month: 0  Patient is on more than two specialty medications: Yes  Specialty Medication: cinacalcet 30mg   Patient Reported Additional Medication X Missed Doses in the Last Month: 0  Adherence tools used: patient uses a pill box to manage medications                Tacrolimus 0.5mg   : 10 days of medicine on hand   Mycophenolate 180mg   : 10 days of medicine on hand   cinacalcet 30mg   : 45 days of medicine on hand         SHIPPING     Shipping address confirmed in Epic.     Delivery Scheduled: Yes, Expected medication delivery date: 05/11/2020.     Medication will be delivered via UPS to the prescription address in [facility].    Thad Ranger   [facility] Specialty Pharmacist

## 2020-05-10 MED FILL — MAGNESIUM OXIDE 400 MG (241.3 MG MAGNESIUM) TABLET: ORAL | 30 days supply | Qty: 30 | Fill #0

## 2020-05-10 MED FILL — MYCOPHENOLATE SODIUM 180 MG TABLET,DELAYED RELEASE: 30 days supply | Qty: 180 | Fill #1 | Status: AC

## 2020-05-10 MED FILL — MYCOPHENOLATE SODIUM 180 MG TABLET,DELAYED RELEASE: ORAL | 30 days supply | Qty: 180 | Fill #1

## 2020-05-10 MED FILL — CHLORTHALIDONE 25 MG TABLET: ORAL | 30 days supply | Qty: 15 | Fill #5

## 2020-05-10 MED FILL — CHLORTHALIDONE 25 MG TABLET: 30 days supply | Qty: 15 | Fill #5 | Status: AC

## 2020-05-10 MED FILL — TACROLIMUS 0.5 MG CAPSULE, IMMEDIATE-RELEASE: 30 days supply | Qty: 210 | Fill #1

## 2020-05-10 MED FILL — MAGNESIUM OXIDE 400 MG (241.3 MG MAGNESIUM) TABLET: 30 days supply | Qty: 30 | Fill #0 | Status: AC

## 2020-05-10 MED FILL — TACROLIMUS 0.5 MG CAPSULE, IMMEDIATE-RELEASE: 30 days supply | Qty: 210 | Fill #1 | Status: AC

## 2020-05-31 ENCOUNTER — Encounter: Payer: Self-pay | Admitting: Emergency Medicine

## 2020-05-31 ENCOUNTER — Other Ambulatory Visit: Payer: Self-pay

## 2020-05-31 ENCOUNTER — Emergency Department: Payer: Medicaid Other

## 2020-05-31 ENCOUNTER — Inpatient Hospital Stay
Admission: EM | Admit: 2020-05-31 | Discharge: 2020-06-06 | DRG: 177 | Disposition: A | Discharge status: Home Health | Payer: Medicaid Other | Attending: Internal Medicine | Admitting: Internal Medicine

## 2020-05-31 DIAGNOSIS — Z91013 Allergy to seafood: Secondary | ICD-10-CM

## 2020-05-31 DIAGNOSIS — J449 Chronic obstructive pulmonary disease, unspecified: Secondary | ICD-10-CM | POA: Diagnosis present

## 2020-05-31 DIAGNOSIS — R000 Tachycardia, unspecified: Secondary | ICD-10-CM | POA: Diagnosis present

## 2020-05-31 DIAGNOSIS — J1282 Pneumonia due to coronavirus disease 2019: Secondary | ICD-10-CM | POA: Diagnosis present

## 2020-05-31 DIAGNOSIS — E860 Dehydration: Secondary | ICD-10-CM | POA: Diagnosis present

## 2020-05-31 DIAGNOSIS — I10 Essential (primary) hypertension: Secondary | ICD-10-CM | POA: Diagnosis present

## 2020-05-31 DIAGNOSIS — Z880 Allergy status to penicillin: Secondary | ICD-10-CM

## 2020-05-31 DIAGNOSIS — M3214 Glomerular disease in systemic lupus erythematosus: Secondary | ICD-10-CM | POA: Diagnosis present

## 2020-05-31 DIAGNOSIS — U071 COVID-19: Principal | ICD-10-CM | POA: Diagnosis present

## 2020-05-31 DIAGNOSIS — R7989 Other specified abnormal findings of blood chemistry: Secondary | ICD-10-CM | POA: Diagnosis present

## 2020-05-31 DIAGNOSIS — A0839 Other viral enteritis: Secondary | ICD-10-CM | POA: Diagnosis present

## 2020-05-31 DIAGNOSIS — M329 Systemic lupus erythematosus, unspecified: Secondary | ICD-10-CM | POA: Diagnosis present

## 2020-05-31 DIAGNOSIS — A419 Sepsis, unspecified organism: Secondary | ICD-10-CM

## 2020-05-31 DIAGNOSIS — I739 Peripheral vascular disease, unspecified: Secondary | ICD-10-CM | POA: Diagnosis present

## 2020-05-31 DIAGNOSIS — Z940 Kidney transplant status: Secondary | ICD-10-CM

## 2020-05-31 DIAGNOSIS — D61818 Other pancytopenia: Secondary | ICD-10-CM | POA: Diagnosis present

## 2020-05-31 DIAGNOSIS — Z881 Allergy status to other antibiotic agents status: Secondary | ICD-10-CM

## 2020-05-31 DIAGNOSIS — D638 Anemia in other chronic diseases classified elsewhere: Secondary | ICD-10-CM | POA: Diagnosis present

## 2020-05-31 DIAGNOSIS — R079 Chest pain, unspecified: Secondary | ICD-10-CM

## 2020-05-31 DIAGNOSIS — D849 Immunodeficiency, unspecified: Secondary | ICD-10-CM | POA: Diagnosis present

## 2020-05-31 DIAGNOSIS — Z888 Allergy status to other drugs, medicaments and biological substances status: Secondary | ICD-10-CM

## 2020-05-31 DIAGNOSIS — M810 Age-related osteoporosis without current pathological fracture: Secondary | ICD-10-CM | POA: Diagnosis present

## 2020-05-31 DIAGNOSIS — Z283 Underimmunization status: Secondary | ICD-10-CM

## 2020-05-31 DIAGNOSIS — K219 Gastro-esophageal reflux disease without esophagitis: Secondary | ICD-10-CM | POA: Diagnosis present

## 2020-05-31 DIAGNOSIS — N179 Acute kidney failure, unspecified: Secondary | ICD-10-CM | POA: Diagnosis present

## 2020-05-31 DIAGNOSIS — R1310 Dysphagia, unspecified: Secondary | ICD-10-CM

## 2020-05-31 DIAGNOSIS — E871 Hypo-osmolality and hyponatremia: Secondary | ICD-10-CM | POA: Diagnosis present

## 2020-05-31 DIAGNOSIS — Z79899 Other long term (current) drug therapy: Secondary | ICD-10-CM

## 2020-05-31 LAB — PROCALCITONIN: Procalcitonin: 0.45 ng/mL

## 2020-05-31 LAB — CBC
HCT: 38.3 % (ref 36.0–46.0)
Hemoglobin: 11.7 g/dL — ABNORMAL LOW (ref 12.0–15.0)
MCH: 18.6 pg — ABNORMAL LOW (ref 26.0–34.0)
MCHC: 30.5 g/dL (ref 30.0–36.0)
MCV: 60.9 fL — ABNORMAL LOW (ref 80.0–100.0)
Platelets: 125 10*3/uL — ABNORMAL LOW (ref 150–400)
RBC: 6.29 MIL/uL — ABNORMAL HIGH (ref 3.87–5.11)
RDW: 16.1 % — ABNORMAL HIGH (ref 11.5–15.5)
WBC: 2.8 10*3/uL — ABNORMAL LOW (ref 4.0–10.5)
nRBC: 0 % (ref 0.0–0.2)

## 2020-05-31 LAB — COMPREHENSIVE METABOLIC PANEL
ALT: 70 U/L — ABNORMAL HIGH (ref 0–44)
AST: 94 U/L — ABNORMAL HIGH (ref 15–41)
Albumin: 4.4 g/dL (ref 3.5–5.0)
Alkaline Phosphatase: 93 U/L (ref 38–126)
Anion gap: 19 — ABNORMAL HIGH (ref 5–15)
BUN: 38 mg/dL — ABNORMAL HIGH (ref 6–20)
CO2: 16 mmol/L — ABNORMAL LOW (ref 22–32)
Calcium: 9 mg/dL (ref 8.9–10.3)
Chloride: 93 mmol/L — ABNORMAL LOW (ref 98–111)
Creatinine, Ser: 1.92 mg/dL — ABNORMAL HIGH (ref 0.44–1.00)
GFR, Estimated: 30 mL/min — ABNORMAL LOW (ref >60)
Glucose, Bld: 92 mg/dL (ref 70–99)
Potassium: 3.8 mmol/L (ref 3.5–5.1)
Sodium: 128 mmol/L — ABNORMAL LOW (ref 135–145)
Total Bilirubin: 0.7 mg/dL (ref 0.3–1.2)
Total Protein: 7.7 g/dL (ref 6.5–8.1)

## 2020-05-31 LAB — FIBRIN DERIVATIVES D-DIMER (ARMC ONLY): Fibrin derivatives D-dimer (ARMC): 1367.99 ng/mL (FEU) — ABNORMAL HIGH (ref 0.00–499.00)

## 2020-05-31 LAB — LACTIC ACID, PLASMA: Lactic Acid, Venous: 0.9 mmol/L (ref 0.5–1.9)

## 2020-05-31 LAB — RESP PANEL BY RT-PCR (FLU A&B, COVID) ARPGX2
Influenza A by PCR: NEGATIVE
Influenza B by PCR: NEGATIVE
SARS Coronavirus 2 by RT PCR: POSITIVE — AB

## 2020-05-31 LAB — TRIGLYCERIDES: Triglycerides: 118 mg/dL (ref <150)

## 2020-05-31 LAB — BASIC METABOLIC PANEL
Anion gap: 18 — ABNORMAL HIGH (ref 5–15)
BUN: 34 mg/dL — ABNORMAL HIGH (ref 6–20)
CO2: 16 mmol/L — ABNORMAL LOW (ref 22–32)
Calcium: 8.9 mg/dL (ref 8.9–10.3)
Chloride: 93 mmol/L — ABNORMAL LOW (ref 98–111)
Creatinine, Ser: 1.85 mg/dL — ABNORMAL HIGH (ref 0.44–1.00)
GFR, Estimated: 32 mL/min — ABNORMAL LOW (ref >60)
Glucose, Bld: 92 mg/dL (ref 70–99)
Potassium: 3.8 mmol/L (ref 3.5–5.1)
Sodium: 127 mmol/L — ABNORMAL LOW (ref 135–145)

## 2020-05-31 LAB — LACTATE DEHYDROGENASE: LDH: 205 U/L — ABNORMAL HIGH (ref 98–192)

## 2020-05-31 LAB — FIBRINOGEN: Fibrinogen: 516 mg/dL — ABNORMAL HIGH (ref 210–475)

## 2020-05-31 MED ORDER — SODIUM CHLORIDE 0.9 % IV SOLN
1000.0000 mL | INTRAVENOUS | Status: DC
  Administered 2020-05-31: 1000 mL via INTRAVENOUS

## 2020-05-31 MED ORDER — LACTATED RINGERS IV SOLN: INTRAVENOUS | Status: DC

## 2020-05-31 MED ORDER — VANCOMYCIN HCL IN DEXTROSE 1-5 GM/200ML-% IV SOLN
1000.0000 mg | Freq: Once | INTRAVENOUS | Status: DC
  Filled 2020-05-31: qty 200

## 2020-05-31 MED ORDER — SODIUM CHLORIDE 0.9 % IV SOLN
100.0000 mg | Freq: Every day | INTRAVENOUS | Status: AC
  Administered 2020-06-02 – 2020-06-05 (×4): 100 mg via INTRAVENOUS
  Filled 2020-05-31 (×3): qty 100
  Filled 2020-05-31: qty 20

## 2020-05-31 MED ORDER — ACETAMINOPHEN 325 MG PO TABS
650.0000 mg | ORAL_TABLET | Freq: Once | ORAL | Status: AC
  Administered 2020-05-31: 650 mg via ORAL
  Filled 2020-05-31 (×2): qty 2

## 2020-05-31 MED ORDER — SODIUM CHLORIDE 0.9 % IV BOLUS
500.0000 mL | Freq: Once | INTRAVENOUS | Status: AC
  Administered 2020-05-31: 500 mL via INTRAVENOUS

## 2020-05-31 MED ORDER — SODIUM CHLORIDE 0.9 % IV SOLN: 500.0000 mg | INTRAVENOUS | Status: DC

## 2020-05-31 MED ORDER — SODIUM CHLORIDE 0.9 % IV SOLN
2.0000 g | Freq: Once | INTRAVENOUS | Status: AC
  Administered 2020-06-01: 2 g via INTRAVENOUS
  Filled 2020-05-31: qty 2

## 2020-05-31 MED ORDER — SODIUM CHLORIDE 0.9 % IV SOLN
200.0000 mg | Freq: Once | INTRAVENOUS | Status: AC
  Administered 2020-06-01: 200 mg via INTRAVENOUS
  Filled 2020-05-31: qty 40

## 2020-05-31 MED ORDER — SODIUM CHLORIDE 0.9 % IV SOLN: 2.0000 g | INTRAVENOUS | Status: DC

## 2020-05-31 NOTE — ED Notes (Signed)
Pt refusing tylenol at this time, states she last took it this AM but it upsets her stomach.

## 2020-05-31 NOTE — ED Provider Notes (Signed)
[facility] Emergency Department Provider Note    First MD Initiated Contact with Patient 05/31/20 2129     (approximate)  I have reviewed the triage vital signs and the nursing notes.   HISTORY  Chief Complaint Weakness, Fever, and Covid Exposure    HPI Robin Sanchez is a 54 y.o. female with a history esrd and sp transplant p/w sob and flu like symptoms with decreased po intake after recent exposure to family member currently here in hospital for covid.  She is on immunosuppressive therapy.  Has been having daily fevers and chills.  She is not vaccinated.  Not received monoclonal antibodies.    Past Medical History:  Diagnosis Date  . Anemia   . Cancer ([place])    cervical  . Chronic kidney disease    dialysis t,t,s  . Dysrhythmia   . GERD (gastroesophageal reflux disease)   . Hypertension   . Lupus ([place])    currently in remission  . Osteoporosis   . Peripheral vascular disease ([place])   . Shortness of breath dyspnea    occas   History reviewed. No pertinent family history. Past Surgical History:  Procedure Laterality Date  . ARTERIOVENOUS GRAFT PLACEMENT     currently dialysis access in right thigh  . DILATION AND CURETTAGE OF UTERUS    . PERIPHERAL VASCULAR CATHETERIZATION N/A 11/13/2015   Procedure: A/V Shuntogram/Fistulagram;  Surgeon: Algernon Huxley, MD;  Location: [place] [facility];  Service: Cardiovascular;  Laterality: N/A;  . PERIPHERAL VASCULAR CATHETERIZATION  11/13/2015   Procedure: Dialysis/Perma Catheter Insertion;  Surgeon: Algernon Huxley, MD;  Location: [place] [facility];  Service: Cardiovascular;;  . PERIPHERAL VASCULAR CATHETERIZATION N/A 01/18/2016   Procedure: Dialysis/Perma Catheter Removal;  Surgeon: Algernon Huxley, MD;  Location: [facility] [facility];  Service: Cardiovascular;  Laterality: N/A;  . REVISION OF ARTERIOVENOUS GORETEX GRAFT Left 11/23/2015   Procedure: REVISION OF ARTERIOVENOUS GORETEX GRAFT;  Surgeon: Algernon Huxley, MD;   Location: [facility];  Service: Vascular;  Laterality: Left;  . STENTS     MULTIPLE DIALYSIS STENTS   Patient Active Problem List   Diagnosis Date Noted  . Arm pain, diffuse, left 11/01/2016  . HTN (hypertension) 05/01/2016  . ESRD (end stage renal disease) ([place]) 05/01/2016  . Renal dialysis device, implant, or graft complication 53/66/4403      Prior to Admission medications   Medication Sig Start Date End Date Taking? Authorizing Provider  acetaminophen (TYLENOL) 325 MG tablet Take 325-650 mg by mouth. 10/15/16   [provider]  amLODipine (NORVASC) 10 MG tablet Take 10 mg by mouth daily.    [provider]  carvedilol (COREG) 25 MG tablet Take 25 mg by mouth 2 (two) times daily with a meal.    [provider]  cinacalcet (SENSIPAR) 60 MG tablet Take 60 mg by mouth daily.    [provider]  famotidine (PEPCID) 20 MG tablet Take 20 mg by mouth.    [provider]  Ferric Citrate (AURYXIA) 1 GM 210 MG(Fe) TABS Take 2 tablets by mouth 3 (three) times daily before meals.    [provider]  magnesium oxide (MAG-OX) 400 MG tablet Take 400 mg by mouth.    [provider]  oxyCODONE (OXY IR/ROXICODONE) 5 MG immediate release tablet Take 5-10 mg by mouth. 10/15/16   [provider]  polyethylene glycol (MIRALAX / GLYCOLAX) packet Take by mouth. 10/15/16   [provider]  Allergies Hydroxychloroquine, Shellfish allergy, Bleomycin, Penicillins, Aspirin, and Vancomycin    Social History Social History   Tobacco Use  . Smoking status: Never Smoker  . Smokeless tobacco: Never Used  Substance Use Topics  . Alcohol use: No  . Drug use: No    Review of Systems Patient denies headaches, rhinorrhea, blurry vision, numbness, shortness of breath, chest pain, edema, cough, abdominal pain, nausea, vomiting, diarrhea, dysuria, fevers, rashes or hallucinations unless otherwise stated above in  HPI. ____________________________________________   PHYSICAL EXAM:  VITAL SIGNS: Vitals:   05/31/20 2145 05/31/20 2250  BP: 114/73 121/85  Pulse: (!) 113 (!) 104  Resp: 20 19  Temp: (!) 101.7 F (38.7 C)   SpO2: 96% 95%    Constitutional: Alert and oriented. Ill appearing Eyes: Conjunctivae are normal.  Head: Atraumatic. Nose: No congestion/rhinnorhea. Mouth/Throat: Mucous membranes are moist.   Neck: No stridor. Painless ROM.  Cardiovascular: Normal rate, regular rhythm. Grossly normal heart sounds.  Good peripheral circulation. Respiratory: tachypnea with inspiratory crackles Gastrointestinal: Soft and nontender. No distention. No abdominal bruits. No CVA tenderness. Genitourinary: defered Musculoskeletal: No lower extremity tenderness nor edema.  No joint effusions. Neurologic:  Normal speech and language. No gross focal neurologic deficits are appreciated. No facial droop Skin:  Skin is warm, dry and intact. No rash noted. Psychiatric: Mood and affect are normal. Speech and behavior are normal.  ____________________________________________   LABS (all labs ordered are listed, but only abnormal results are displayed)  Results for orders placed or performed during the hospital encounter of 05/31/20 (from the past 24 hour(s))  Basic metabolic panel     Status: Abnormal   Collection Time: 05/31/20  6:40 PM  Result Value Ref Range   Sodium 127 (L) 135 - 145 mmol/L   Potassium 3.8 3.5 - 5.1 mmol/L   Chloride 93 (L) 98 - 111 mmol/L   CO2 16 (L) 22 - 32 mmol/L   Glucose, Bld 92 70 - 99 mg/dL   BUN 34 (H) 6 - 20 mg/dL   Creatinine, Ser 1.85 (H) 0.44 - 1.00 mg/dL   Calcium 8.9 8.9 - 10.3 mg/dL   GFR, Estimated 32 (L) >60 mL/min   Anion gap 18 (H) 5 - 15  CBC     Status: Abnormal   Collection Time: 05/31/20  6:40 PM  Result Value Ref Range   WBC 2.8 (L) 4.0 - 10.5 K/uL   RBC 6.29 (H) 3.87 - 5.11 MIL/uL   Hemoglobin 11.7 (L) 12.0 - 15.0 g/dL   HCT 38.3 36 - 46 %    MCV 60.9 (L) 80.0 - 100.0 fL   MCH 18.6 (L) 26.0 - 34.0 pg   MCHC 30.5 30.0 - 36.0 g/dL   RDW 16.1 (H) 11.5 - 15.5 %   Platelets 125 (L) 150 - 400 K/uL   nRBC 0.0 0.0 - 0.2 %  Resp Panel by RT-PCR (Flu A&B, Covid) Nasopharyngeal Swab     Status: Abnormal   Collection Time: 05/31/20  9:02 PM   Specimen: Nasopharyngeal Swab; Nasopharyngeal(NP) swabs in vial transport medium  Result Value Ref Range   SARS Coronavirus 2 by RT PCR POSITIVE (A) NEGATIVE   Influenza A by PCR NEGATIVE NEGATIVE   Influenza B by PCR NEGATIVE NEGATIVE  Lactic acid, plasma     Status: None   Collection Time: 05/31/20  9:51 PM  Result Value Ref Range   Lactic Acid, Venous 0.9 0.5 - 1.9 mmol/L  Comprehensive metabolic panel     Status:  Abnormal   Collection Time: 05/31/20  9:51 PM  Result Value Ref Range   Sodium 128 (L) 135 - 145 mmol/L   Potassium 3.8 3.5 - 5.1 mmol/L   Chloride 93 (L) 98 - 111 mmol/L   CO2 16 (L) 22 - 32 mmol/L   Glucose, Bld 92 70 - 99 mg/dL   BUN 38 (H) 6 - 20 mg/dL   Creatinine, Ser 1.92 (H) 0.44 - 1.00 mg/dL   Calcium 9.0 8.9 - 10.3 mg/dL   Total Protein 7.7 6.5 - 8.1 g/dL   Albumin 4.4 3.5 - 5.0 g/dL   AST 94 (H) 15 - 41 U/L   ALT 70 (H) 0 - 44 U/L   Alkaline Phosphatase 93 38 - 126 U/L   Total Bilirubin 0.7 0.3 - 1.2 mg/dL   GFR, Estimated 30 (L) >60 mL/min   Anion gap 19 (H) 5 - 15  Fibrin derivatives D-Dimer     Status: Abnormal   Collection Time: 05/31/20  9:51 PM  Result Value Ref Range   Fibrin derivatives D-dimer ([facility]) 1,367.99 (H) 0.00 - 499.00 ng/mL (FEU)  Procalcitonin     Status: None   Collection Time: 05/31/20  9:51 PM  Result Value Ref Range   Procalcitonin 0.45 ng/mL  Lactate dehydrogenase     Status: Abnormal   Collection Time: 05/31/20  9:51 PM  Result Value Ref Range   LDH 205 (H) 98 - 192 U/L  Triglycerides     Status: None   Collection Time: 05/31/20  9:51 PM  Result Value Ref Range   Triglycerides 118 <150 mg/dL  Fibrinogen     Status: Abnormal    Collection Time: 05/31/20  9:51 PM  Result Value Ref Range   Fibrinogen 516 (H) 210 - 475 mg/dL   ____________________________________________  EKG My review and personal interpretation at Time: 18:27   Indication: weakness  Rate: 120  Rhythm: sinus Axis: normal Other: normal intervals, no stemi ____________________________________________  RADIOLOGY  I personally reviewed all radiographic images ordered to evaluate for the above acute complaints and reviewed radiology reports and findings.  These findings were personally discussed with the patient.  Please see medical record for radiology report.  ____________________________________________   PROCEDURES  Procedure(s) performed:  Procedures    Critical Care performed: no ____________________________________________   INITIAL IMPRESSION / ASSESSMENT AND PLAN / ED COURSE  Pertinent labs & imaging results that were available during my care of the patient were reviewed by me and considered in my medical decision making (see chart for details).   DDX: covid 19 pna,  Asthma, copd, CHF, pna, ptx, malignancy, Pe, anemia   Robin Sanchez is a 54 y.o. who presents to the ED with presentation as described above.  Given her recent Covid exposure I am suspicious for COVID-19 infection.  Blood work sent for by differential.  Chest x-ray shows diffuse bilateral pulmonary infiltrates.  Her presentation is complicated as she is a transplant patient.  She is not currently hypoxic but does have sirs criteria as she is quite winded at rest.  Does meet criteria for remdesivir.  Based on her presentation I am going to speak with the hospitalist for admission.  Have discussed with the patient and available family all diagnostics and treatments performed thus far and all questions were answered to the best of my ability. The patient demonstrates understanding and agreement with plan.      The patient was evaluated in Emergency Department today for  the symptoms described in  the history of present illness. He/she was evaluated in the context of the global COVID-19 pandemic, which necessitated consideration that the patient might be at risk for infection with the SARS-CoV-2 virus that causes COVID-19. Institutional protocols and algorithms that pertain to the evaluation of patients at risk for COVID-19 are in a state of rapid change based on information released by regulatory bodies including the CDC and federal and state organizations. These policies and algorithms were followed during the patient's care in the ED.  As part of my medical decision making, I reviewed the following data within the [place] notes reviewed and incorporated, Labs reviewed, notes from prior ED visits and [place] Controlled Substance Database   ____________________________________________   FINAL CLINICAL IMPRESSION(S) / ED DIAGNOSES  Final diagnoses:  Sepsis, due to unspecified organism, unspecified whether acute organ dysfunction present ([place])  Pneumonia due to COVID-19 virus      NEW MEDICATIONS STARTED DURING THIS VISIT:  New Prescriptions   No medications on file     Note:  This document was prepared using Dragon voice recognition software and may include unintentional dictation errors.    Merlyn Lot, MD 05/31/20 [phone]

## 2020-05-31 NOTE — ED Triage Notes (Signed)
Pt comes into the ED via [facility] from home c/o increased weakness and lethargy since confirmed with COVID.  Pt also c/o headache, fever, and body aches.  Pt states she has decreased appetite and not able to hold down much for fluids.  Pt presents tachycardic but has even and unlabored respirations.

## 2020-05-31 NOTE — Progress Notes (Signed)
Remdesivir - Pharmacy Brief Note     A/P:  Remdesivir 200 mg IVPB once followed by 100 mg IVPB daily x 4 days.   Hart Robinsons, PharmD Clinical Pharmacist  06/01/2020 12:00 AM

## 2020-06-01 ENCOUNTER — Other Ambulatory Visit: Payer: Self-pay

## 2020-06-01 DIAGNOSIS — I739 Peripheral vascular disease, unspecified: Secondary | ICD-10-CM | POA: Diagnosis present

## 2020-06-01 DIAGNOSIS — M3214 Glomerular disease in systemic lupus erythematosus: Secondary | ICD-10-CM | POA: Diagnosis present

## 2020-06-01 DIAGNOSIS — Z283 Underimmunization status: Secondary | ICD-10-CM | POA: Diagnosis not present

## 2020-06-01 DIAGNOSIS — Z79899 Other long term (current) drug therapy: Secondary | ICD-10-CM | POA: Diagnosis not present

## 2020-06-01 DIAGNOSIS — J1282 Pneumonia due to coronavirus disease 2019: Secondary | ICD-10-CM

## 2020-06-01 DIAGNOSIS — Z880 Allergy status to penicillin: Secondary | ICD-10-CM | POA: Diagnosis not present

## 2020-06-01 DIAGNOSIS — R7989 Other specified abnormal findings of blood chemistry: Secondary | ICD-10-CM

## 2020-06-01 DIAGNOSIS — U071 COVID-19: Secondary | ICD-10-CM | POA: Diagnosis present

## 2020-06-01 DIAGNOSIS — Z91013 Allergy to seafood: Secondary | ICD-10-CM | POA: Diagnosis not present

## 2020-06-01 DIAGNOSIS — Z940 Kidney transplant status: Secondary | ICD-10-CM | POA: Diagnosis not present

## 2020-06-01 DIAGNOSIS — M810 Age-related osteoporosis without current pathological fracture: Secondary | ICD-10-CM | POA: Diagnosis present

## 2020-06-01 DIAGNOSIS — D638 Anemia in other chronic diseases classified elsewhere: Secondary | ICD-10-CM | POA: Diagnosis present

## 2020-06-01 DIAGNOSIS — Z881 Allergy status to other antibiotic agents status: Secondary | ICD-10-CM | POA: Diagnosis not present

## 2020-06-01 DIAGNOSIS — J449 Chronic obstructive pulmonary disease, unspecified: Secondary | ICD-10-CM | POA: Diagnosis present

## 2020-06-01 DIAGNOSIS — D61818 Other pancytopenia: Secondary | ICD-10-CM | POA: Diagnosis present

## 2020-06-01 DIAGNOSIS — D849 Immunodeficiency, unspecified: Secondary | ICD-10-CM | POA: Diagnosis present

## 2020-06-01 DIAGNOSIS — R1013 Epigastric pain: Secondary | ICD-10-CM | POA: Diagnosis not present

## 2020-06-01 DIAGNOSIS — E871 Hypo-osmolality and hyponatremia: Secondary | ICD-10-CM | POA: Diagnosis present

## 2020-06-01 DIAGNOSIS — N179 Acute kidney failure, unspecified: Secondary | ICD-10-CM | POA: Diagnosis present

## 2020-06-01 DIAGNOSIS — K219 Gastro-esophageal reflux disease without esophagitis: Secondary | ICD-10-CM | POA: Diagnosis present

## 2020-06-01 DIAGNOSIS — A0839 Other viral enteritis: Secondary | ICD-10-CM | POA: Diagnosis present

## 2020-06-01 DIAGNOSIS — Z888 Allergy status to other drugs, medicaments and biological substances status: Secondary | ICD-10-CM | POA: Diagnosis not present

## 2020-06-01 DIAGNOSIS — I10 Essential (primary) hypertension: Secondary | ICD-10-CM | POA: Diagnosis present

## 2020-06-01 DIAGNOSIS — R000 Tachycardia, unspecified: Secondary | ICD-10-CM | POA: Diagnosis present

## 2020-06-01 DIAGNOSIS — E860 Dehydration: Secondary | ICD-10-CM | POA: Diagnosis present

## 2020-06-01 LAB — FERRITIN: Ferritin: 5485 ng/mL — ABNORMAL HIGH (ref 11–307)

## 2020-06-01 LAB — LACTIC ACID, PLASMA: Lactic Acid, Venous: 0.9 mmol/L (ref 0.5–1.9)

## 2020-06-01 LAB — C-REACTIVE PROTEIN: CRP: 13.3 mg/dL — ABNORMAL HIGH (ref <1.0)

## 2020-06-01 LAB — HIV ANTIBODY (ROUTINE TESTING W REFLEX): HIV Screen 4th Generation wRfx: NONREACTIVE

## 2020-06-01 MED ORDER — MYCOPHENOLATE SODIUM 180 MG PO TBEC
540.0000 mg | DELAYED_RELEASE_TABLET | Freq: Two times a day (BID) | ORAL | Status: DC
  Administered 2020-06-01 – 2020-06-06 (×11): 540 mg via ORAL
  Filled 2020-06-01 (×12): qty 3

## 2020-06-01 MED ORDER — ACETAMINOPHEN 325 MG PO TABS
650.0000 mg | ORAL_TABLET | Freq: Four times a day (QID) | ORAL | Status: DC | PRN
  Administered 2020-06-01: 650 mg via ORAL
  Filled 2020-06-01: qty 2

## 2020-06-01 MED ORDER — ONDANSETRON HCL 4 MG PO TABS: 4.0000 mg | ORAL_TABLET | Freq: Four times a day (QID) | ORAL | Status: DC | PRN

## 2020-06-01 MED ORDER — TACROLIMUS 0.5 MG PO CAPS
1.5000 mg | ORAL_CAPSULE | Freq: Two times a day (BID) | ORAL | Status: DC
  Administered 2020-06-01 (×2): 2 mg via ORAL
  Filled 2020-06-01 (×3): qty 4

## 2020-06-01 MED ORDER — MAGNESIUM OXIDE 400 (241.3 MG) MG PO TABS
400.0000 mg | ORAL_TABLET | Freq: Two times a day (BID) | ORAL | Status: DC
  Administered 2020-06-01 – 2020-06-04 (×7): 400 mg via ORAL
  Filled 2020-06-01 (×7): qty 1

## 2020-06-01 MED ORDER — HYDROCODONE-ACETAMINOPHEN 5-325 MG PO TABS
1.0000 | ORAL_TABLET | ORAL | Status: DC | PRN
  Administered 2020-06-02: 1 via ORAL
  Filled 2020-06-01: qty 1

## 2020-06-01 MED ORDER — SODIUM CHLORIDE 0.9 % IV SOLN: 200.0000 mg | Freq: Once | INTRAVENOUS | Status: DC

## 2020-06-01 MED ORDER — ZINC SULFATE 220 (50 ZN) MG PO CAPS
220.0000 mg | ORAL_CAPSULE | Freq: Every day | ORAL | Status: DC
  Administered 2020-06-01 – 2020-06-06 (×6): 220 mg via ORAL
  Filled 2020-06-01 (×6): qty 1

## 2020-06-01 MED ORDER — CINACALCET HCL 30 MG PO TABS
60.0000 mg | ORAL_TABLET | Freq: Every day | ORAL | Status: DC
  Administered 2020-06-01 – 2020-06-06 (×6): 60 mg via ORAL
  Filled 2020-06-01 (×7): qty 2

## 2020-06-01 MED ORDER — SODIUM CHLORIDE 0.9 % IV SOLN: 100.0000 mg | Freq: Every day | INTRAVENOUS | Status: DC

## 2020-06-01 MED ORDER — ONDANSETRON HCL 4 MG/2ML IJ SOLN
4.0000 mg | Freq: Four times a day (QID) | INTRAMUSCULAR | Status: DC | PRN
  Administered 2020-06-01 – 2020-06-04 (×4): 4 mg via INTRAVENOUS
  Filled 2020-06-01 (×4): qty 2

## 2020-06-01 MED ORDER — GUAIFENESIN-DM 100-10 MG/5ML PO SYRP: 10.0000 mL | ORAL_SOLUTION | ORAL | Status: DC | PRN

## 2020-06-01 MED ORDER — ALBUTEROL SULFATE HFA 108 (90 BASE) MCG/ACT IN AERS
2.0000 | INHALATION_SPRAY | Freq: Four times a day (QID) | RESPIRATORY_TRACT | Status: DC
  Administered 2020-06-01 – 2020-06-06 (×15): 2 via RESPIRATORY_TRACT
  Filled 2020-06-01: qty 6.7

## 2020-06-01 MED ORDER — HYDROCOD POLST-CPM POLST ER 10-8 MG/5ML PO SUER: 5.0000 mL | Freq: Two times a day (BID) | ORAL | Status: DC | PRN

## 2020-06-01 MED ORDER — SODIUM CHLORIDE 0.9 % IV SOLN: INTRAVENOUS | Status: AC

## 2020-06-01 MED ORDER — HYDRALAZINE HCL 20 MG/ML IJ SOLN
10.0000 mg | Freq: Four times a day (QID) | INTRAMUSCULAR | Status: DC | PRN
  Filled 2020-06-01: qty 1

## 2020-06-01 MED ORDER — CHLORTHALIDONE 25 MG PO TABS
12.5000 mg | ORAL_TABLET | Freq: Every morning | ORAL | Status: DC
  Filled 2020-06-01: qty 0.5

## 2020-06-01 MED ORDER — ASCORBIC ACID 500 MG PO TABS
500.0000 mg | ORAL_TABLET | Freq: Every day | ORAL | Status: DC
  Administered 2020-06-01 – 2020-06-06 (×6): 500 mg via ORAL
  Filled 2020-06-01 (×6): qty 1

## 2020-06-01 MED ORDER — ENOXAPARIN SODIUM 30 MG/0.3ML ~~LOC~~ SOLN
30.0000 mg | SUBCUTANEOUS | Status: DC
  Administered 2020-06-01 – 2020-06-02 (×2): 30 mg via SUBCUTANEOUS
  Filled 2020-06-01 (×2): qty 0.3

## 2020-06-01 NOTE — Progress Notes (Signed)
PHARMACY -  BRIEF ANTIBIOTIC NOTE   Pharmacy has received consult(s) for Vancomycin and Cefepime from an ED provider.  The patient's profile has been reviewed for ht/wt/allergies/indication/available labs.    One time order(s) placed for Cefepime 2gm and Vancomycin 1gm  Further antibiotics/pharmacy consults should be ordered by admitting physician if indicated.                       Thank you, Hart Robinsons A 06/01/2020  12:01 AM

## 2020-06-01 NOTE — Plan of Care (Signed)
  Problem: Education: Goal: Knowledge of General Education information will improve Description: Including pain rating scale, medication(s)/side effects and non-pharmacologic comfort measures Outcome: Progressing   Problem: Clinical Measurements: Goal: Respiratory complications will improve Outcome: Progressing   

## 2020-06-01 NOTE — Progress Notes (Addendum)
PROGRESS NOTE  Robin Sanchez PQZ:[id number] DOB: 1965-09-22 DOA: 05/31/2020 PCP: Patient, No Pcp Per   LOS: 0 days   Brief narrative: Robin Sanchez is a 54 y.o. 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  Patient did have recent exposure to Covid in a family member.  Patient is unvaccinated.  In the ED, patient was febrile with temperature 101.7, tachycardic and tachypneic.  Her pulse ox was 95% on room air.  She did have leukopenia of 2800, hemoglobin 11.7.  Platelets 125K.  Chemistry was significant for creatinine of 1.92, up from 0.85 two months prior on Care Everywhere.  She also had  elevation in liver enzymes all of which were normal except for alk phos of 132 on Care Everywhere 2 months prior.  Inflammatory biomarkers including LDH ferritin and D-dimer were all elevated.  D-dimer   was 1367.99.  Covid test was positive.  Chest x-ray with mild multifocal bilateral infiltrates right greater than left.Patient started on remdesivir.  Hospitalist was consulted for admission to the hospital for Covid pneumonia.  Assessment/Plan:  Principal Problem:   Pneumonia due to COVID-19 virus Active Problems:   HTN (hypertension)   Kidney transplant status   Systemic lupus erythematosus, unspecified (HCC)   Anemia due to chronic illness   Elevated LFTs   Hyponatremia   AKI (acute kidney injury) ([place])   Pancytopenia (HCC)   Multifocal pneumonia due to COVID-19 virus in immunosuppressed patient.  Currently on remdesivir, albuterol,  Antitussives.  We will continue to monitor inflammatory biomarkers.  Patient is currently on room air.  Will consider steroids and baricitinib depending upon clinical progression.  Patient has a very high risk of developing severe disease.  Lactate was  0.9.  HIV was nonreactive.  Blood cultures negative in less than 12 hours. ferritin CRP extremely elevated.  D-dimer is extremely elevated as well.  Will need to closely monitor inflammatory biomarkers.  COVID-19 Labs  Recent Labs    05/31/20 2151  FERRITIN 5,485*  LDH 205*  CRP 13.3*    Lab Results  Component Value Date   SARSCOV2NAA POSITIVE (A) 05/31/2020    Elevated D-dimer Secondary to Covid pneumonia.  No hypoxia.  Could consider VQ scan if needed if worsening hypoxia or chest pain.  Continue DVT prophylaxis.Marland Kitchen  Acute kidney injury secondary to Covid gastroenteritis with volume depletion.  Patient reported nausea and vomiting 4 days prior to presentation.  Most recent baseline creatinine of 0.85.  Received IV fluid bolus in the ED. BMP pending for this morning.  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  Will hold for now due to acute kidney injury.  Add as needed hydralazine    Systemic lupus erythematosus, unspecified ([place]) Chronic and stable at this time.  Pancytopenia WBC 2800, platelets 125,000 and Hb 11.7 on presentation.  Will monitor closely. ,   Elevated LFTs AST/ALT 94/70 respectively on presentation.  Normal at baseline.  Continue to monitor.  Could be secondary to viral illness.    Hyponatremia -IV fluid bolus x1 L with normal saline.  BMP pending for this morning    DVT prophylaxis: enoxaparin (LOVENOX) injection 30 mg Start: 06/01/20 1000  Code Status: Full code  Family Communication: Patient is daughter Mr.  Robin Sanchez and his spouse Mr. Robin Sanchez on the phone listed in the computer but was unable to reach them. Daughters phone number stating not in service  Status is: Inpatient  Remains inpatient appropriate because:IV treatments appropriate due to intensity of illness or inability to take PO, Inpatient level of care  appropriate due to severity of illness and Covid pneumonia in immunocompromised patient   Dispo: The patient is from: Home              Anticipated d/c is to: Home              Anticipated d/c date is: 3 days or more              Patient currently is not medically stable to d/c.  Consultants:  None  Procedures:  None  Antibiotics:  . Remdesivir 11/24>  Anti-infectives (From admission, onward)   Start     Dose/Rate Route Frequency Ordered Stop   06/02/20 1000  remdesivir 100 mg in sodium chloride 0.9 % 100 mL IVPB       "Followed by" Linked Group Details   100 mg 200 mL/hr over 30 Minutes Intravenous Daily 05/31/20 2359 06/06/20 0959   06/01/20 1000  remdesivir 100 mg in sodium chloride 0.9 % 100 mL IVPB  Status:  Discontinued       "Followed by" Linked Group Details   100 mg 200 mL/hr over 30 Minutes Intravenous Daily 06/01/20 0014 06/01/20 0026   06/01/20 0100  remdesivir 200 mg in sodium chloride 0.9% 250 mL IVPB       "Followed by" Linked Group Details   200 mg 580 mL/hr over 30 Minutes Intravenous Once 05/31/20 2359 06/01/20 0205   06/01/20 0015  remdesivir 200 mg in sodium chloride 0.9% 250 mL IVPB  Status:  Discontinued       "Followed by" Linked Group Details   200 mg 580 mL/hr over 30 Minutes Intravenous Once 06/01/20 0014 06/01/20 0026   06/01/20 0000  vancomycin (VANCOCIN) IVPB 1000 mg/200 mL premix  Status:  Discontinued        1,000 mg 200 mL/hr over 60 Minutes Intravenous  Once 05/31/20 2345 06/01/20 0239   06/01/20 0000  ceFEPIme (MAXIPIME) 2 g in sodium chloride 0.9 % 100 mL IVPB        2 g 200 mL/hr over 30 Minutes Intravenous  Once 05/31/20 2345 06/01/20 0111   05/31/20 2345  cefTRIAXone (ROCEPHIN) 2 g in sodium chloride 0.9 % 100 mL IVPB  Status:  Discontinued        2 g 200 mL/hr over 30 Minutes Intravenous Every 24 hours 05/31/20 2344 05/31/20 2346   05/31/20 2345  azithromycin (ZITHROMAX) 500 mg in sodium chloride 0.9 % 250 mL IVPB  Status:   Discontinued        500 mg 250 mL/hr over 60 Minutes Intravenous Every 24 hours 05/31/20 2344 05/31/20 2346      Subjective: Today, patient was seen and examined at bedside.  States that she does not feel well, has fatigue and weakness and nausea.  Has mild cough but no shortness of breath or dyspnea  Objective: Vitals:   06/01/20 0157 06/01/20 0408  BP: 126/78 111/73  Pulse: (!) 105 92  Resp: 19 20  Temp: 99.2 F (37.3 C) 99.1 F (37.3 C)  SpO2: 97% 97%    Intake/Output Summary (Last 24 hours) at 06/01/2020 0739 Last data filed at 06/01/2020 0655 Gross per 24 hour  Intake 100 ml  Output 600 ml  Net -500 ml   Filed Weights   05/31/20 1824 06/01/20 0157  Weight: 48.2 kg 67.3 kg   Body mass index is 34.47 kg/m.   Physical Exam: GENERAL: Patient is alert awake and oriented. Not in obvious distress, obese HENT: No scleral pallor or icterus. Pupils equally reactive to light. Oral mucosa is moist NECK: is supple, no gross swelling noted. CHEST: Diminished breath sounds bilaterally,. CVS: S1 and S2 heard, no murmur. Regular rate and rhythm.  ABDOMEN: Soft, non-tender, bowel sounds are present. EXTREMITIES: No edema. CNS: Cranial nerves are intact. No focal motor deficits. SKIN: warm and dry without rashes.  Data Review: I have personally reviewed the following laboratory data and studies,  CBC: Recent Labs  Lab 05/31/20 1840  WBC 2.8*  HGB 11.7*  HCT 38.3  MCV 60.9*  PLT 412*   Basic Metabolic Panel: Recent Labs  Lab 05/31/20 1840 05/31/20 2151  NA 127* 128*  K 3.8 3.8  CL 93* 93*  CO2 16* 16*  GLUCOSE 92 92  BUN 34* 38*  CREATININE 1.85* 1.92*  CALCIUM 8.9 9.0   Liver Function Tests: Recent Labs  Lab 05/31/20 2151  AST 94*  ALT 70*  ALKPHOS 93  BILITOT 0.7  PROT 7.7  ALBUMIN 4.4   No results for input(s): LIPASE, AMYLASE in the last 168 hours. No results for input(s): AMMONIA in the last 168 hours. Cardiac Enzymes: No results for  input(s): CKTOTAL, CKMB, CKMBINDEX, TROPONINI in the last 168 hours. BNP (last 3 results) No results for input(s): BNP in the last 8760 hours.  ProBNP (last 3 results) No results for input(s): PROBNP in the last 8760 hours.  CBG: No results for input(s): GLUCAP in the last 168 hours. Recent Results (from the past 240 hour(s))  Resp Panel by RT-PCR (Flu A&B, Covid) Nasopharyngeal Swab     Status: Abnormal   Collection Time: 05/31/20  9:02 PM   Specimen: Nasopharyngeal Swab; Nasopharyngeal(NP) swabs in vial transport medium  Result Value Ref Range Status   SARS Coronavirus 2 by RT PCR POSITIVE (A) NEGATIVE Final    Comment: RESULT CALLED TO, READ BACK BY AND VERIFIED WITH: RUSSELL A. @2330  ON 05/31/20 SKL (NOTE) SARS-CoV-2 target nucleic acids are DETECTED.  The SARS-CoV-2 RNA is generally detectable in upper respiratory specimens during the acute phase of infection. Positive results are indicative of the presence of the identified virus, but do not rule out bacterial infection or co-infection with other pathogens not detected by the test. Clinical correlation with patient history and other diagnostic information is necessary to determine patient infection status. The expected result is Negative.  Fact Sheet for Patients: EntrepreneurPulse.com.au  Fact Sheet for Healthcare Providers: IncredibleEmployment.be  This test is not yet approved or cleared by the [place] FDA and  has been authorized for detection and/or diagnosis of SARS-CoV-2 by FDA under an Emergency Use Authorization (EUA).  This EUA will remain in effect (meaning this test can be  used) for the duration of  the COVID-19 declaration under Section 564(b)(1) of the Act, 21 U.S.C. section 360bbb-3(b)(1), unless the authorization is terminated or revoked sooner.     Influenza A by PCR NEGATIVE NEGATIVE Final   Influenza B by PCR NEGATIVE NEGATIVE Final    Comment: (NOTE) The  Xpert Xpress SARS-CoV-2/FLU/RSV plus assay is intended as an aid in the diagnosis of influenza from Nasopharyngeal swab specimens and should not be used as a sole basis for treatment. Nasal washings and aspirates  are unacceptable for Xpert Xpress SARS-CoV-2/FLU/RSV testing.  Fact Sheet for Patients: EntrepreneurPulse.com.au  Fact Sheet for Healthcare Providers: IncredibleEmployment.be  This test is not yet approved or cleared by the [place] FDA and has been authorized for detection and/or diagnosis of SARS-CoV-2 by FDA under an Emergency Use Authorization (EUA). This EUA will remain in effect (meaning this test can be used) for the duration of the COVID-19 declaration under Section 564(b)(1) of the Act, 21 U.S.C. section 360bbb-3(b)(1), unless the authorization is terminated or revoked.  Performed at [facility], [address] [place], [place] [address]   Blood Culture (routine x 2)     Status: None (Preliminary result)   Collection Time: 05/31/20  9:51 PM   Specimen: BLOOD  Result Value Ref Range Status   Specimen Description BLOOD BLOOD RIGHT HAND  Final   Special Requests   Final    BOTTLES DRAWN AEROBIC AND ANAEROBIC Blood Culture adequate volume   Culture   Final    NO GROWTH < 12 HOURS Performed at [facility], [address], [place], [place] [address]    Report Status PENDING  Incomplete  Blood Culture (routine x 2)     Status: None (Preliminary result)   Collection Time: 05/31/20  9:51 PM   Specimen: BLOOD  Result Value Ref Range Status   Specimen Description BLOOD RIGHT ANTECUBITAL  Final   Special Requests   Final    BOTTLES DRAWN AEROBIC AND ANAEROBIC Blood Culture adequate volume   Culture   Final    NO GROWTH < 12 HOURS Performed at [facility], [address], [place], [place] [address]    Report Status PENDING  Incomplete     Studies: DG Chest Portable 1 View  Result Date:  05/31/2020 CLINICAL DATA:  Shortness of breath and weakness.  COVID positive. EXAM: PORTABLE CHEST 1 VIEW COMPARISON:  None. FINDINGS: Mild ill-defined multifocal infiltrates are seen involving the mid and lower lung fields, right greater than left. There is no evidence of a pleural effusion or pneumothorax. The heart size and mediastinal contours are within normal limits. Radiopaque vascular stents are seen overlying the superior mediastinum and left axilla. The visualized skeletal structures are unremarkable. IMPRESSION: Mild multifocal bilateral infiltrates, right greater than left. Electronically Signed   By: Virgina Norfolk M.D.   On: 05/31/2020 21:52      Flora Lipps, MD  [facility] 06/01/2020

## 2020-06-01 NOTE — H&P (Signed)
History and Physical    Robin Sanchez CWU:[id number] DOB: June 21, 1966 DOA: 05/31/2020  PCP: Patient, No Pcp Per   Patient coming from: Home  I have personally briefly reviewed patient's old medical records in [place]  Chief Complaint: Vomiting diarrhea weakness fever, Covid exposure  HPI: Robin Sanchez is a 55 y.o. female with medical history significant for f SLE, HTN, kidney transplant in April 2018 secondary to lupus nephritis on tacrolimus, mycophenolate and cinacalcet, followed at [facility], who presents to the ER by EMS with a several day complaint of headache, fever and body aches, vomiting and diarrhea with poor oral intake  and weakness, with history of recent exposure to Covid and a family member who is currently hospitalized.  Patient is unvaccinated and has received no prior treatment for Covid.  She does have a cough with shortness of breath.  She states her vomiting and diarrhea started improving in the past 24 hours. ED course: on arrival she was febrile at 101.7, tachycardic at 113 tachypneic at 26 with O2 sat 95% on room air.  On her blood work, leukopenia of 2800, hemoglobin 11.7.  Platelets 125K.  Chemistry significant for creatinine of 1.92, up from 0.85 two months prior on Care Everywhere.  She also had l elevation in liver enzymes all of which were normal except for alk phos of 132 on Care Everywhere 2 months prior.  Inflammatory biomarkers including LDH ferritin and D-dimer were all elevated.  D-dimer  1367.99.  Covid test positive.  Chest x-ray with mild multifocal bilateral infiltrates right greater than left EKG as reviewed by me : Sinus tach of 120 Patient started on remdesivir.  Hospitalist consulted for admission.  Review of Systems: As per HPI otherwise all other systems on review of systems negative.    Past Medical History:  Diagnosis Date  . Anemia   . Cancer ([place])    cervical  . Chronic kidney disease    dialysis t,t,s  . Dysrhythmia   . GERD  (gastroesophageal reflux disease)   . Hypertension   . Lupus ([place])    currently in remission  . Osteoporosis   . Peripheral vascular disease ([place])   . Shortness of breath dyspnea    occas    Past Surgical History:  Procedure Laterality Date  . ARTERIOVENOUS GRAFT PLACEMENT     currently dialysis access in right thigh  . DILATION AND CURETTAGE OF UTERUS    . PERIPHERAL VASCULAR CATHETERIZATION N/A 11/13/2015   Procedure: A/V Shuntogram/Fistulagram;  Surgeon: Algernon Huxley, MD;  Location: [facility];  Service: Cardiovascular;  Laterality: N/A;  . PERIPHERAL VASCULAR CATHETERIZATION  11/13/2015   Procedure: Dialysis/Perma Catheter Insertion;  Surgeon: Algernon Huxley, MD;  Location: [facility];  Service: Cardiovascular;;  . PERIPHERAL VASCULAR CATHETERIZATION N/A 01/18/2016   Procedure: Dialysis/Perma Catheter Removal;  Surgeon: Algernon Huxley, MD;  Location: [facility];  Service: Cardiovascular;  Laterality: N/A;  . REVISION OF ARTERIOVENOUS GORETEX GRAFT Left 11/23/2015   Procedure: REVISION OF ARTERIOVENOUS GORETEX GRAFT;  Surgeon: Algernon Huxley, MD;  Location: [facility];  Service: Vascular;  Laterality: Left;  . STENTS     MULTIPLE DIALYSIS STENTS     reports that she has never smoked. She has never used smokeless tobacco. She reports that she does not drink alcohol and does not use drugs.  Allergies  Allergen Reactions  . Hydroxychloroquine Other (See Comments)    Toxic maculopathy Toxic maculopathy   . Shellfish Allergy  Shortness Of Breath and Swelling    Angioedema, throat swells closed  . Bleomycin   . Penicillins Nausea And Vomiting    Has patient had a PCN reaction causing immediate rash, facial/tongue/throat swelling, SOB or lightheadedness with hypotension: no Has patient had a PCN reaction causing severe rash involving mucus membranes or skin necrosis:no Has patient had a PCN reaction that required hospitalization no Has patient had a PCN reaction  occurring within the last 10 years: no If all of the above answers are "NO", then may proceed with Cephalosporin use.  . Aspirin Rash  . Vancomycin Rash    History reviewed. No pertinent family history.    Prior to Admission medications   Medication Sig Start Date End Date Taking? Authorizing Provider  chlorthalidone (HYGROTON) 25 MG tablet Take 12.5 mg by mouth in the morning. 07/06/19 07/05/20 Yes [provider]  cinacalcet (SENSIPAR) 60 MG tablet Take 60 mg by mouth daily.   Yes [provider]  magnesium oxide (MAG-OX) 400 MG tablet Take 400 mg by mouth.   Yes [provider]  mycophenolate (MYFORTIC) 180 MG EC tablet Take 540 mg by mouth 2 (two) times daily. 04/14/20 04/14/21 Yes [provider]  tacrolimus (PROGRAF) 0.5 MG capsule Take 1.5-2 mg by mouth 2 (two) times daily. 05/06/17  Yes [provider]    Physical Exam: Vitals:   05/31/20 1824 05/31/20 2145 05/31/20 2250  BP: 124/67 114/73 121/85  Pulse: (!) 127 (!) 113 (!) 104  Resp: (!) _0 Temp: (!) 100.6 F (38.1 C) (!) 101.7 F (38.7 C)   TempSrc: Oral Oral   SpO2: 95% 96% 95%  Weight: 48.2 kg    Height: _1  (1.397 m)       Vitals:   05/31/20 1824 05/31/20 2145 05/31/20 2250  BP: 124/67 114/73 121/85  Pulse: (!) 127 (!) 113 (!) 104  Resp: (!) _2 Temp: (!) 100.6 F (38.1 C) (!) 101.7 F (38.7 C)   TempSrc: Oral Oral   SpO2: 95% 96% 95%  Weight: 48.2 kg    Height: _3  (1.397 m)        Constitutional: Alert and oriented x 3 .  Ill-appearing HEENT:      Head: Normocephalic and atraumatic.         Eyes: PERLA, EOMI, Conjunctivae are normal. Sclera is non-icteric.       Mouth/Throat:  Deferred in the setting of Covid positivity      Neck: Supple with no signs of meningismus. Cardiovascular:  Tachycardic. No murmurs, gallops, or rubs. 2+ symmetrical distal pulses are present . No JVD. No LE edema Respiratory: Respiratory effort increased with  coarse breath sounds bilaterally Gastrointestinal: Soft, non tender, and non distended with positive bowel sounds. No rebound or guarding. Genitourinary: No CVA tenderness. Musculoskeletal: Nontender with normal range of motion in all extremities. No cyanosis, or erythema of extremities. Neurologic:  Face is symmetric. Moving all extremities. No gross focal neurologic deficits . Skin: Skin is warm, dry.  No rash or ulcers Psychiatric: Mood and affect are normal    Labs on Admission: I have personally reviewed following labs and imaging studies  CBC: Recent Labs  Lab 05/31/20 1840  WBC 2.8*  HGB 11.7*  HCT 38.3  MCV 60.9*  PLT 323*   Basic Metabolic Panel: Recent Labs  Lab 05/31/20 1840 05/31/20 2151  NA 127* 128*  K 3.8 3.8  CL 93* 93*  CO2 16* 16*  GLUCOSE  92 92  BUN 34* 38*  CREATININE 1.85* 1.92*  CALCIUM 8.9 9.0   GFR: Estimated Creatinine Clearance: 20.7 mL/min (A) (by C-G formula based on SCr of 1.92 mg/dL (H)). Liver Function Tests: Recent Labs  Lab 05/31/20 2151  AST 94*  ALT 70*  ALKPHOS 93  BILITOT 0.7  PROT 7.7  ALBUMIN 4.4   No results for input(s): LIPASE, AMYLASE in the last 168 hours. No results for input(s): AMMONIA in the last 168 hours. Coagulation Profile: No results for input(s): INR, PROTIME in the last 168 hours. Cardiac Enzymes: No results for input(s): CKTOTAL, CKMB, CKMBINDEX, TROPONINI in the last 168 hours. BNP (last 3 results) No results for input(s): PROBNP in the last 8760 hours. HbA1C: No results for input(s): HGBA1C in the last 72 hours. CBG: No results for input(s): GLUCAP in the last 168 hours. Lipid Profile: Recent Labs    05/31/20 2151  TRIG 118   Thyroid Function Tests: No results for input(s): TSH, T4TOTAL, FREET4, T3FREE, THYROIDAB in the last 72 hours. Anemia Panel: Recent Labs    05/31/20 2151  FERRITIN 5,485*   Urine analysis: No results found for: COLORURINE, APPEARANCEUR, LABSPEC, PHURINE, GLUCOSEU,  HGBUR, BILIRUBINUR, KETONESUR, PROTEINUR, UROBILINOGEN, NITRITE, LEUKOCYTESUR  Radiological Exams on Admission: DG Chest Portable 1 View  Result Date: 05/31/2020 CLINICAL DATA:  Shortness of breath and weakness.  COVID positive. EXAM: PORTABLE CHEST 1 VIEW COMPARISON:  None. FINDINGS: Mild ill-defined multifocal infiltrates are seen involving the mid and lower lung fields, right greater than left. There is no evidence of a pleural effusion or pneumothorax. The heart size and mediastinal contours are within normal limits. Radiopaque vascular stents are seen overlying the superior mediastinum and left axilla. The visualized skeletal structures are unremarkable. IMPRESSION: Mild multifocal bilateral infiltrates, right greater than left. Electronically Signed   By: Virgina Norfolk M.D.   On: 05/31/2020 21:52     Assessment/Plan SLE, HTN, kidney transplant in April 2018 secondary to lupus nephritis presenting with flulike symptoms including vomiting and diarrhea.  Tested positive for Covid     Pneumonia due to COVID-19 virus in immunosuppressed patient -Immunosuppressed patient with flulike symptoms, unvaccinated for Covid with known Covid exposure testing positive with typical findings on chest x-ray -Remdesivir, albuterol, antitussives, vitamins -Patient not hypoxic -We will start Decadron if hypoxic and proning as tolerated -Monitor inflammatory biomarkers -Patient at high risk for severe disease  Elevated D-dimer -Suspect related to Covid pneumonia, patient is not hypoxic  -Consider VQ scan to evaluate for PE  Covid gastroenteritis with dehydration AKI (acute kidney injury) ([place]) -Patient reported 4 days of nausea and vomiting which started improving in the past 24 hours -Creatinine 1.92, likely prerenal related to vomiting and diarrhea, most recent baseline was 0.85 in Care Everywhere 2 months prior -IV fluid bolus x1 -Monitor renal function and avoid nephrotoxins -Consider  nephrology consult if worsening  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  Patient had normal LFTs in Care Everywhere a couple months prior -Continue to monitor    Hyponatremia -IV fluid bolus x1 L with normal saline    DVT prophylaxis: Lovenox  Code Status: full code  Family Communication:  none  Disposition Plan: Back to previous home environment Consults  called: none  Status:At the time of admission, it appears that the appropriate admission status for this patient is INPATIENT. This is judged to be reasonable and necessary in order to provide the required intensity of service to ensure the patient's safety given the presenting symptoms, physical exam findings, and initial radiographic and laboratory data in the context of their  Comorbid conditions.   Patient requires inpatient status due to high intensity of service, high risk for further deterioration and high frequency of surveillance required.   I certify that at the point of admission it is my clinical judgment that the patient will require inpatient hospital care spanning beyond [place] MD [facility]     06/01/2020, 12:19 AM

## 2020-06-01 NOTE — ED Notes (Signed)
Hospital MD at bedside

## 2020-06-01 NOTE — ED Notes (Signed)
Message pharmacy regarding Vancomycin on allergy list

## 2020-06-01 NOTE — ED Notes (Signed)
Sent attending message regarding allergy to vanc

## 2020-06-01 NOTE — ED Notes (Signed)
Attempted to call report

## 2020-06-01 NOTE — Progress Notes (Signed)
PHARMACIST - PHYSICIAN COMMUNICATION  CONCERNING:  Enoxaparin (Lovenox) for DVT Prophylaxis    RECOMMENDATION: Patient was prescribed enoxaprin 40mg  q24 hours for VTE prophylaxis.   Filed Weights   05/31/20 1824 06/01/20 0157  Weight: 48.2 kg (106 lb 4.2 oz) 67.3 kg (148 lb 4.8 oz)    Body mass index is 34.47 kg/m.  Estimated Creatinine Clearance: 24.7 mL/min (A) (by C-G formula based on SCr of 1.92 mg/dL (H)).   Patient is candidate for enoxaparin 30mg  every 24 hours based on CrCl <13ml/min or Weight <45kg  DESCRIPTION: Pharmacy has adjusted enoxaparin dose per [facility] policy.  Patient is now receiving enoxaparin 30 mg every 24 hours    Ena Dawley, PharmD Clinical Pharmacist  06/01/2020 6:03 AM

## 2020-06-01 NOTE — ED Notes (Signed)
Report called to [facility] ,  Pt calm , collective, denied pain or sob upon transport . Advised nurse of vancomycin allergy

## 2020-06-02 ENCOUNTER — Inpatient Hospital Stay: Payer: Medicaid Other

## 2020-06-02 DIAGNOSIS — D638 Anemia in other chronic diseases classified elsewhere: Secondary | ICD-10-CM | POA: Diagnosis not present

## 2020-06-02 DIAGNOSIS — I10 Essential (primary) hypertension: Secondary | ICD-10-CM

## 2020-06-02 DIAGNOSIS — R7989 Other specified abnormal findings of blood chemistry: Secondary | ICD-10-CM | POA: Diagnosis not present

## 2020-06-02 DIAGNOSIS — M329 Systemic lupus erythematosus, unspecified: Secondary | ICD-10-CM

## 2020-06-02 DIAGNOSIS — D61818 Other pancytopenia: Secondary | ICD-10-CM

## 2020-06-02 DIAGNOSIS — Z940 Kidney transplant status: Secondary | ICD-10-CM

## 2020-06-02 DIAGNOSIS — N179 Acute kidney failure, unspecified: Secondary | ICD-10-CM | POA: Diagnosis not present

## 2020-06-02 DIAGNOSIS — U071 COVID-19: Secondary | ICD-10-CM | POA: Diagnosis not present

## 2020-06-02 LAB — COMPREHENSIVE METABOLIC PANEL
ALT: 69 U/L — ABNORMAL HIGH (ref 0–44)
AST: 87 U/L — ABNORMAL HIGH (ref 15–41)
Albumin: 3.9 g/dL (ref 3.5–5.0)
Alkaline Phosphatase: 80 U/L (ref 38–126)
Anion gap: 15 (ref 5–15)
BUN: 43 mg/dL — ABNORMAL HIGH (ref 6–20)
CO2: 17 mmol/L — ABNORMAL LOW (ref 22–32)
Calcium: 9 mg/dL (ref 8.9–10.3)
Chloride: 101 mmol/L (ref 98–111)
Creatinine, Ser: 1.82 mg/dL — ABNORMAL HIGH (ref 0.44–1.00)
GFR, Estimated: 32 mL/min — ABNORMAL LOW (ref >60)
Glucose, Bld: 87 mg/dL (ref 70–99)
Potassium: 4 mmol/L (ref 3.5–5.1)
Sodium: 133 mmol/L — ABNORMAL LOW (ref 135–145)
Total Bilirubin: 1 mg/dL (ref 0.3–1.2)
Total Protein: 6.9 g/dL (ref 6.5–8.1)

## 2020-06-02 LAB — BLOOD GAS, ARTERIAL
Acid-base deficit: 6 mmol/L — ABNORMAL HIGH (ref 0.0–2.0)
Bicarbonate: 18.3 mmol/L — ABNORMAL LOW (ref 20.0–28.0)
FIO2: 0.28
O2 Saturation: 98.3 %
Patient temperature: 37
pCO2 arterial: 31 mmHg — ABNORMAL LOW (ref 32.0–48.0)
pH, Arterial: 7.38 (ref 7.350–7.450)
pO2, Arterial: 112 mmHg — ABNORMAL HIGH (ref 83.0–108.0)

## 2020-06-02 LAB — C-REACTIVE PROTEIN: CRP: 11.8 mg/dL — ABNORMAL HIGH (ref <1.0)

## 2020-06-02 LAB — CBC WITH DIFFERENTIAL/PLATELET
Abs Immature Granulocytes: 0.07 10*3/uL (ref 0.00–0.07)
Basophils Absolute: 0 10*3/uL (ref 0.0–0.1)
Basophils Relative: 0 %
Eosinophils Absolute: 0 10*3/uL (ref 0.0–0.5)
Eosinophils Relative: 0 %
HCT: 34.3 % — ABNORMAL LOW (ref 36.0–46.0)
Hemoglobin: 10.6 g/dL — ABNORMAL LOW (ref 12.0–15.0)
Immature Granulocytes: 3 %
Lymphocytes Relative: 34 %
Lymphs Abs: 0.8 10*3/uL (ref 0.7–4.0)
MCH: 18.6 pg — ABNORMAL LOW (ref 26.0–34.0)
MCHC: 30.9 g/dL (ref 30.0–36.0)
MCV: 60.1 fL — ABNORMAL LOW (ref 80.0–100.0)
Monocytes Absolute: 0.4 10*3/uL (ref 0.1–1.0)
Monocytes Relative: 16 %
Neutro Abs: 1.1 10*3/uL — ABNORMAL LOW (ref 1.7–7.7)
Neutrophils Relative %: 47 %
Platelets: 123 10*3/uL — ABNORMAL LOW (ref 150–400)
RBC: 5.71 MIL/uL — ABNORMAL HIGH (ref 3.87–5.11)
RDW: 16.1 % — ABNORMAL HIGH (ref 11.5–15.5)
Smear Review: NORMAL
WBC: 2.3 10*3/uL — ABNORMAL LOW (ref 4.0–10.5)
nRBC: 0 % (ref 0.0–0.2)

## 2020-06-02 LAB — FIBRIN DERIVATIVES D-DIMER (ARMC ONLY): Fibrin derivatives D-dimer (ARMC): 1173.15 ng/mL (FEU) — ABNORMAL HIGH (ref 0.00–499.00)

## 2020-06-02 LAB — MAGNESIUM: Magnesium: 1.8 mg/dL (ref 1.7–2.4)

## 2020-06-02 LAB — PROTIME-INR
INR: 1.2 (ref 0.8–1.2)
Prothrombin Time: 14.6 seconds (ref 11.4–15.2)

## 2020-06-02 LAB — APTT: aPTT: 59 seconds — ABNORMAL HIGH (ref 24–36)

## 2020-06-02 LAB — FERRITIN: Ferritin: 7472 ng/mL — ABNORMAL HIGH (ref 11–307)

## 2020-06-02 MED ORDER — HEPARIN (PORCINE) 25000 UT/250ML-% IV SOLN
650.0000 [IU]/h | INTRAVENOUS | Status: DC
  Administered 2020-06-02: 800 [IU]/h via INTRAVENOUS
  Filled 2020-06-02: qty 250

## 2020-06-02 MED ORDER — LOPERAMIDE HCL 2 MG PO CAPS
2.0000 mg | ORAL_CAPSULE | ORAL | Status: DC | PRN
  Administered 2020-06-02 (×2): 2 mg via ORAL
  Filled 2020-06-02 (×2): qty 1

## 2020-06-02 MED ORDER — SODIUM CHLORIDE 0.9 % IV SOLN: INTRAVENOUS | Status: AC

## 2020-06-02 MED ORDER — TACROLIMUS 1 MG PO CAPS
2.0000 mg | ORAL_CAPSULE | Freq: Two times a day (BID) | ORAL | Status: DC
  Filled 2020-06-02: qty 2

## 2020-06-02 MED ORDER — TACROLIMUS 1 MG PO CAPS
1.5000 mg | ORAL_CAPSULE | Freq: Every day | ORAL | Status: DC
  Administered 2020-06-02 – 2020-06-05 (×4): 1.5 mg via ORAL
  Filled 2020-06-02 (×5): qty 1

## 2020-06-02 MED ORDER — NITROGLYCERIN 0.4 MG SL SUBL
0.4000 mg | SUBLINGUAL_TABLET | SUBLINGUAL | Status: DC | PRN
  Administered 2020-06-02: 0.4 mg via SUBLINGUAL

## 2020-06-02 MED ORDER — SODIUM CHLORIDE 0.9 % IV BOLUS
500.0000 mL | INTRAVENOUS | Status: AC
  Administered 2020-06-02: 500 mL via INTRAVENOUS

## 2020-06-02 MED ORDER — TACROLIMUS 1 MG PO CAPS
2.0000 mg | ORAL_CAPSULE | Freq: Every day | ORAL | Status: DC
  Administered 2020-06-02 – 2020-06-06 (×5): 2 mg via ORAL
  Filled 2020-06-02 (×5): qty 2

## 2020-06-02 MED ORDER — ALPRAZOLAM 0.25 MG PO TABS
0.2500 mg | ORAL_TABLET | Freq: Once | ORAL | Status: AC
  Administered 2020-06-02: 0.25 mg via ORAL
  Filled 2020-06-02: qty 1

## 2020-06-02 MED ORDER — NITROGLYCERIN 0.4 MG SL SUBL
SUBLINGUAL_TABLET | SUBLINGUAL | Status: AC
  Filled 2020-06-02: qty 1

## 2020-06-02 MED ORDER — HEPARIN BOLUS VIA INFUSION
2000.0000 [IU] | Freq: Once | INTRAVENOUS | Status: AC
  Administered 2020-06-02: 2000 [IU] via INTRAVENOUS
  Filled 2020-06-02: qty 2000

## 2020-06-02 NOTE — Consult Note (Signed)
[facility] for Heparin Infusion Indication: High clinical suspicion for PE  Patient Measurements: Heparin Dosing Weight: 49.9 kg  Labs: Recent Labs    05/31/20 1840 05/31/20 2151 06/02/20 0838  HGB 11.7*  --  10.6*  HCT 38.3  --  34.3*  PLT 125*  --  123*  CREATININE 1.85* 1.92* 1.82*    Estimated Creatinine Clearance: 25.7 mL/min (A) (by C-G formula based on SCr of 1.82 mg/dL (H)).   Medical History: Past Medical History:  Diagnosis Date  . Anemia   . Cancer ([place])    cervical  . Chronic kidney disease    dialysis t,t,s  . Dysrhythmia   . GERD (gastroesophageal reflux disease)   . Hypertension   . Lupus ([place])    currently in remission  . Osteoporosis   . Peripheral vascular disease ([place])   . Shortness of breath dyspnea    occas    Medications:  No anticoagulation prior to admission per my chart review  Assessment: Patient is a 54 y/o F with medical history as above who presented to the ED 11/24 with weakness / lethargy in setting of known SARS-CoV-2 positivity. Patient was subsequently admitted for COVID-19 pneumonia. D-dimer 1367 >> 1173. Patient is currently saturating appropriately on room air. Pharmacy has been consulted to initiate heparin infusion for high clinical suspicion of PE. VQ scan ordered for further evaluate.   CBC today notable for Hgb 10.6, platelets 123. Baseline aPTT and PT-INR pending. Patient last received prophylactic dose Lovenox on 11/26 at 0910.  Goal of Therapy:  Heparin level 0.3-0.7 units/ml Monitor platelets by anticoagulation protocol: Yes   Plan:  --Will order heparin 2000 unit IV bolus x 1 followed by continuous infusion at 800 units/hr --Will check HL 8 hours after initiation of infusion --Will check daily CBC per protocol  Benita Gutter 06/02/2020,5:55 PM

## 2020-06-02 NOTE — Progress Notes (Addendum)
PROGRESS NOTE  Robin Sanchez ZDG:[id number] DOB: Apr 23, 1966 DOA: 05/31/2020 PCP: Patient, No Pcp Per   LOS: 1 day   Brief narrative: Robin Sanchez is a 54 y.o. 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  Patient did have recent exposure to Covid in a family member.  Patient is unvaccinated.  In the ED, patient was febrile with temperature 101.7, tachycardic and tachypneic.  Her pulse ox was 95% on room air.  She did have leukopenia of 2800, hemoglobin 11.7.  Platelets 125K.  Chemistry was significant for creatinine of 1.92, up from 0.85 two months prior on Care Everywhere.  She also had  elevation in liver enzymes all of which were normal except for alk phos of 132 on Care Everywhere 2 months prior.  Inflammatory biomarkers including LDH ferritin and D-dimer were all elevated.  D-dimer   was 1367.99.  Covid test was positive.  Chest x-ray with mild multifocal bilateral infiltrates right greater than left.Patient started on remdesivir.    Patient was then admitted to hospital for evaluation and treatment of Covid pneumonia  Assessment/Plan:  Principal Problem:   Pneumonia due to COVID-19 virus Active Problems:   HTN (hypertension)   Kidney transplant status   Systemic lupus erythematosus, unspecified ([place])   Anemia due to chronic illness   Elevated LFTs   Hyponatremia   AKI (acute kidney injury) ([place])   Pancytopenia ([place])   Multifocal pneumonia due to COVID-19 virus in an immunosuppressed patient.  Currently on remdesivir, albuterol,  antitussives.  On room air.  We will continue to monitor inflammatory biomarkers. Will consider steroids and baricitinib depending upon clinical course..  Patient has a very high risk of developing severe disease.  Lactate was 0.9.  HIV was  nonreactive.  Blood cultures negative so far.  Inflammatory markers including ferritin CRP, D-dimer elevated but slightly trended down compared to 2 days back.  Will need to closely monitor inflammatory markers.  COVID-19 Labs  Recent Labs    05/31/20 2151 06/02/20 0838  FERRITIN 5,485* 7,472*  LDH 205*  --   CRP 13.3* 11.8*    Lab Results  Component Value Date   SARSCOV2NAA POSITIVE (A) 05/31/2020     Elevated D-dimer Secondary to Covid pneumonia.  No hypoxia.  Could consider VQ scan if needed if worsening hypoxia or chest pain.  Continue DVT prophylaxis.[place]  Acute kidney injury secondary to Covid gastroenteritis with volume depletion. Patient still has multiple episodes of diarrhea.   We will add loperamide today.  Add gentle IV fluids until tomorrow.  Improved after volume replacement.  Will closely monitor.  Creatinine of 1.8 today from 1.9 two days back.. Reviewed the patient's previous medical records, creatinine levels 2 months back was 0.8 as per Care Everywhere.  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  At this time we will continue since the patient does not seem to be overtly septic and does not have hypoxic respiratory failure or severe Covid illness.Creatinine levels 2 months back was 0.8 as per Care Everywhere.  Patient follows up with [facility] transplant team.  Essential hypertension On chlorthalidone at home.  holding chlorthalidone at this time due to acute kidney injury, on as needed hydralazine    Systemic lupus erythematosus, unspecified ([place]) Chronic and stable at this  time.  Pancytopenia WBC 2800, platelets 125,000 and Hb 11.7 on presentation.  Will monitor closely.    Elevated LFTs AST/ALT 94/70 respectively on presentation.  Normal at baseline.  Continue to monitor.  Could be secondary to viral illness.  Slightly trended down at this time.   Hyponatremia Received IV fluid bolus x1 L with  normal saline.  Sodium levels from today at 133.  Add IV fluids with normal saline at 100 mm/h for 1 day due to ongoing diarrhea and acute kidney injury.   Addendum:  06/02/2020 5:32 PM  Nursing staff reported tachycardia. Assessed patient at bedside. Had some chest discomfort. EKG done showed sinus tachy. Had been having diarrhea and volume loss. Appears anxious. no oxygen demand.   Plan: Add iv bolus, loperamide. Add xanax x 1, chest pain due to pneumonia. Continue hydrocodone.  Has renal insufficiency and aki limiting CTA, diffuse pneumonia may obscure findings on VQ scan, so if she continues have chest pain might need empiric therapeutic anticoagulation. Will check xray of chest. Spoke with nursing staff at bedside about plan. HR already starting to improve.   Check abg, CXR.  DVT prophylaxis: enoxaparin (LOVENOX) injection 30 mg Start: 06/01/20 1000  Code Status: Full code  Family Communication: I was unable to reach his patient's daughter and spouse on the phone yesterday.  I spoke with the patient about it.  Patient stated that her family members were also hospitalized from Covid illness, so I was unable to reach them.  Status is: Inpatient  Remains inpatient appropriate because:IV treatments appropriate due to intensity of illness or inability to take PO, Inpatient level of care appropriate due to severity of illness and Covid pneumonia in immunocompromised patient   Dispo: The patient is from: Home              Anticipated d/c is to: Home              Anticipated d/c date is: 1 to 2 days              Patient currently is not medically stable to d/c.  Consultants:  None  Procedures:  None  Antibiotics:  . Remdesivir 11/24>  Subjective:  Today, patient was seen and examined at bedside.  Complains of multiple episodes of watery diarrhea up to 8 episodes today.  Denies abdominal pain nausea vomiting.  Has mild cough and sputum production but no  dyspnea.  Objective: Vitals:   06/01/20 1946 06/02/20 0502  BP: 100/62 116/80  Pulse: 96   Resp: 20   Temp: 99.6 F (37.6 C) 99.1 F (37.3 C)  SpO2: 98%     Intake/Output Summary (Last 24 hours) at 06/02/2020 0833 Last data filed at 06/02/2020 0100 Gross per 24 hour  Intake 600 ml  Output --  Net 600 ml   Filed Weights   05/31/20 1824 06/01/20 0157 06/02/20 0425  Weight: 48.2 kg 67.3 kg 65.4 kg   Body mass index is 33.52 kg/m.   Physical Exam:  GENERAL: Patient is alert awake and oriented. Not in obvious distress, obese, on room air HENT: No scleral pallor or icterus. Pupils equally reactive to light. Oral mucosa is moist NECK: is supple, no gross swelling noted. CHEST: Diminished breath sounds bilaterally, coarse breath sounds noted. CVS: S1 and S2 heard, no murmur. Regular rate and rhythm.  ABDOMEN: Soft, non-tender, bowel sounds are present. EXTREMITIES: No edema. CNS: Cranial nerves are intact. No focal motor deficits. SKIN: warm and dry without rashes.  Data Review: I have personally reviewed the following laboratory data and studies,  CBC: Recent Labs  Lab 05/31/20 1840  WBC 2.8*  HGB 11.7*  HCT 38.3  MCV 60.9*  PLT 559*   Basic Metabolic Panel: Recent Labs  Lab 05/31/20 1840 05/31/20 2151  NA 127* 128*  K 3.8 3.8  CL 93* 93*  CO2 16* 16*  GLUCOSE 92 92  BUN 34* 38*  CREATININE 1.85* 1.92*  CALCIUM 8.9 9.0   Liver Function Tests: Recent Labs  Lab 05/31/20 2151  AST 94*  ALT 70*  ALKPHOS 93  BILITOT 0.7  PROT 7.7  ALBUMIN 4.4   No results for input(s): LIPASE, AMYLASE in the last 168 hours. No results for input(s): AMMONIA in the last 168 hours. Cardiac Enzymes: No results for input(s): CKTOTAL, CKMB, CKMBINDEX, TROPONINI in the last 168 hours. BNP (last 3 results) No results for input(s): BNP in the last 8760 hours.  ProBNP (last 3 results) No results for input(s): PROBNP in the last 8760 hours.  CBG: No results for  input(s): GLUCAP in the last 168 hours. Recent Results (from the past 240 hour(s))  Resp Panel by RT-PCR (Flu A&B, Covid) Nasopharyngeal Swab     Status: Abnormal   Collection Time: 05/31/20  9:02 PM   Specimen: Nasopharyngeal Swab; Nasopharyngeal(NP) swabs in vial transport medium  Result Value Ref Range Status   SARS Coronavirus 2 by RT PCR POSITIVE (A) NEGATIVE Final    Comment: RESULT CALLED TO, READ BACK BY AND VERIFIED WITH: RUSSELL A. _0  ON 05/31/20 SKL (NOTE) SARS-CoV-2 target nucleic acids are DETECTED.  The SARS-CoV-2 RNA is generally detectable in upper respiratory specimens during the acute phase of infection. Positive results are indicative of the presence of the identified virus, but do not rule out bacterial infection or co-infection with other pathogens not detected by the test. Clinical correlation with patient history and other diagnostic information is necessary to determine patient infection status. The expected result is Negative.  Fact Sheet for Patients: EntrepreneurPulse.com.au  Fact Sheet for Healthcare Providers: IncredibleEmployment.be  This test is not yet approved or cleared by the [place] FDA and  has been authorized for detection and/or diagnosis of SARS-CoV-2 by FDA under an Emergency Use Authorization (EUA).  This EUA will remain in effect (meaning this test can be  used) for the duration of  the COVID-19 declaration under Section 564(b)(1) of the Act, 21 U.S.C. section 360bbb-3(b)(1), unless the authorization is terminated or revoked sooner.     Influenza A by PCR NEGATIVE NEGATIVE Final   Influenza B by PCR NEGATIVE NEGATIVE Final    Comment: (NOTE) The Xpert Xpress SARS-CoV-2/FLU/RSV plus assay is intended as an aid in the diagnosis of influenza from Nasopharyngeal swab specimens and should not be used as a sole basis for treatment. Nasal washings and aspirates are unacceptable for Xpert Xpress  SARS-CoV-2/FLU/RSV testing.  Fact Sheet for Patients: EntrepreneurPulse.com.au  Fact Sheet for Healthcare Providers: IncredibleEmployment.be  This test is not yet approved or cleared by the [place] FDA and has been authorized for detection and/or diagnosis of SARS-CoV-2 by FDA under an Emergency Use Authorization (EUA). This EUA will remain in effect (meaning this test can be used) for the duration of the COVID-19 declaration under Section 564(b)(1) of the Act, 21 U.S.C. section 360bbb-3(b)(1), unless the authorization is terminated or revoked.  Performed at [facility], [address], [place], [place] [address]   Blood Culture (routine x 2)  Status: None (Preliminary result)   Collection Time: 05/31/20  9:51 PM   Specimen: BLOOD  Result Value Ref Range Status   Specimen Description BLOOD BLOOD RIGHT HAND  Final   Special Requests   Final    BOTTLES DRAWN AEROBIC AND ANAEROBIC Blood Culture adequate volume   Culture   Final    NO GROWTH 2 DAYS Performed at [facility], [address] [place], [place] [address]    Report Status PENDING  Incomplete  Blood Culture (routine x 2)     Status: None (Preliminary result)   Collection Time: 05/31/20  9:51 PM   Specimen: BLOOD  Result Value Ref Range Status   Specimen Description BLOOD RIGHT ANTECUBITAL  Final   Special Requests   Final    BOTTLES DRAWN AEROBIC AND ANAEROBIC Blood Culture adequate volume   Culture   Final    NO GROWTH 2 DAYS Performed at [facility], [address], [place], [place] [address]    Report Status PENDING  Incomplete     Studies: DG Chest Portable 1 View  Result Date: 05/31/2020 CLINICAL DATA:  Shortness of breath and weakness.  COVID positive. EXAM: PORTABLE CHEST 1 VIEW COMPARISON:  None. FINDINGS: Mild ill-defined multifocal infiltrates are seen involving the mid and lower lung fields, right greater than left. There is  no evidence of a pleural effusion or pneumothorax. The heart size and mediastinal contours are within normal limits. Radiopaque vascular stents are seen overlying the superior mediastinum and left axilla. The visualized skeletal structures are unremarkable. IMPRESSION: Mild multifocal bilateral infiltrates, right greater than left. Electronically Signed   By: Virgina Norfolk M.D.   On: 05/31/2020 21:52      Flora Lipps, MD  [facility] 06/02/2020

## 2020-06-02 NOTE — Progress Notes (Signed)
PT refused morning labs

## 2020-06-02 NOTE — Progress Notes (Signed)
Pt became tachy on the monitor, HR in the 180s, symptomatic, c/o of chest pain, very weak and look dehydrated. MD was immediately notified. EKG done, pt got 1 dose of nitro which relief the chest pressure. MD ordered for lab work,575ml IV bolus administered. CN and MD at the bedside. Pt currently stable, x ray done, IV fluid initiated after the bolus. Staff will continue to monitor the pt.

## 2020-06-03 ENCOUNTER — Inpatient Hospital Stay: Payer: Medicaid Other

## 2020-06-03 ENCOUNTER — Encounter: Payer: Self-pay | Admitting: Internal Medicine

## 2020-06-03 DIAGNOSIS — R7989 Other specified abnormal findings of blood chemistry: Secondary | ICD-10-CM | POA: Diagnosis not present

## 2020-06-03 DIAGNOSIS — U071 COVID-19: Secondary | ICD-10-CM | POA: Diagnosis not present

## 2020-06-03 DIAGNOSIS — N179 Acute kidney failure, unspecified: Secondary | ICD-10-CM | POA: Diagnosis not present

## 2020-06-03 DIAGNOSIS — D638 Anemia in other chronic diseases classified elsewhere: Secondary | ICD-10-CM | POA: Diagnosis not present

## 2020-06-03 LAB — COMPREHENSIVE METABOLIC PANEL
ALT: 51 U/L — ABNORMAL HIGH (ref 0–44)
AST: 63 U/L — ABNORMAL HIGH (ref 15–41)
Albumin: 3.3 g/dL — ABNORMAL LOW (ref 3.5–5.0)
Alkaline Phosphatase: 70 U/L (ref 38–126)
Anion gap: 12 (ref 5–15)
BUN: 33 mg/dL — ABNORMAL HIGH (ref 6–20)
CO2: 17 mmol/L — ABNORMAL LOW (ref 22–32)
Calcium: 7.9 mg/dL — ABNORMAL LOW (ref 8.9–10.3)
Chloride: 107 mmol/L (ref 98–111)
Creatinine, Ser: 1.34 mg/dL — ABNORMAL HIGH (ref 0.44–1.00)
GFR, Estimated: 47 mL/min — ABNORMAL LOW (ref >60)
Glucose, Bld: 91 mg/dL (ref 70–99)
Potassium: 4 mmol/L (ref 3.5–5.1)
Sodium: 136 mmol/L (ref 135–145)
Total Bilirubin: 0.7 mg/dL (ref 0.3–1.2)
Total Protein: 5.8 g/dL — ABNORMAL LOW (ref 6.5–8.1)

## 2020-06-03 LAB — CBC WITH DIFFERENTIAL/PLATELET
Abs Immature Granulocytes: 0.08 10*3/uL — ABNORMAL HIGH (ref 0.00–0.07)
Basophils Absolute: 0 10*3/uL (ref 0.0–0.1)
Basophils Relative: 0 %
Eosinophils Absolute: 0 10*3/uL (ref 0.0–0.5)
Eosinophils Relative: 1 %
HCT: 29.3 % — ABNORMAL LOW (ref 36.0–46.0)
Hemoglobin: 9 g/dL — ABNORMAL LOW (ref 12.0–15.0)
Immature Granulocytes: 4 %
Lymphocytes Relative: 39 %
Lymphs Abs: 0.8 10*3/uL (ref 0.7–4.0)
MCH: 18.8 pg — ABNORMAL LOW (ref 26.0–34.0)
MCHC: 30.7 g/dL (ref 30.0–36.0)
MCV: 61 fL — ABNORMAL LOW (ref 80.0–100.0)
Monocytes Absolute: 0.3 10*3/uL (ref 0.1–1.0)
Monocytes Relative: 14 %
Neutro Abs: 0.8 10*3/uL — ABNORMAL LOW (ref 1.7–7.7)
Neutrophils Relative %: 42 %
Platelets: 119 10*3/uL — ABNORMAL LOW (ref 150–400)
RBC: 4.8 MIL/uL (ref 3.87–5.11)
RDW: 16.1 % — ABNORMAL HIGH (ref 11.5–15.5)
Smear Review: DECREASED
WBC: 2 10*3/uL — ABNORMAL LOW (ref 4.0–10.5)
nRBC: 0 % (ref 0.0–0.2)

## 2020-06-03 LAB — MAGNESIUM: Magnesium: 1.5 mg/dL — ABNORMAL LOW (ref 1.7–2.4)

## 2020-06-03 LAB — HEPARIN LEVEL (UNFRACTIONATED): Heparin Unfractionated: 1.01 IU/mL — ABNORMAL HIGH (ref 0.30–0.70)

## 2020-06-03 LAB — C-REACTIVE PROTEIN: CRP: 8.2 mg/dL — ABNORMAL HIGH (ref <1.0)

## 2020-06-03 LAB — FERRITIN: Ferritin: 6092 ng/mL — ABNORMAL HIGH (ref 11–307)

## 2020-06-03 LAB — FIBRIN DERIVATIVES D-DIMER (ARMC ONLY): Fibrin derivatives D-dimer (ARMC): 719.52 ng/mL (FEU) — ABNORMAL HIGH (ref 0.00–499.00)

## 2020-06-03 MED ORDER — ENOXAPARIN SODIUM 30 MG/0.3ML ~~LOC~~ SOLN
30.0000 mg | SUBCUTANEOUS | Status: DC
  Administered 2020-06-03: 30 mg via SUBCUTANEOUS
  Filled 2020-06-03: qty 0.3

## 2020-06-03 MED ORDER — PANTOPRAZOLE SODIUM 40 MG PO TBEC
40.0000 mg | DELAYED_RELEASE_TABLET | Freq: Every day | ORAL | Status: DC
  Administered 2020-06-03 – 2020-06-05 (×3): 40 mg via ORAL
  Filled 2020-06-03 (×3): qty 1

## 2020-06-03 MED ORDER — HEPARIN (PORCINE) 25000 UT/250ML-% IV SOLN: 650.0000 [IU]/h | INTRAVENOUS | Status: DC

## 2020-06-03 MED ORDER — TECHNETIUM TO 99M ALBUMIN AGGREGATED
4.0400 | Freq: Once | INTRAVENOUS | Status: AC | PRN
  Administered 2020-06-03: 4.04 via INTRAVENOUS

## 2020-06-03 MED ORDER — SODIUM CHLORIDE 0.9 % IV SOLN: INTRAVENOUS | Status: AC

## 2020-06-03 MED ORDER — ALPRAZOLAM 0.25 MG PO TABS: 0.2500 mg | ORAL_TABLET | Freq: Three times a day (TID) | ORAL | Status: DC | PRN

## 2020-06-03 NOTE — Progress Notes (Signed)
PROGRESS NOTE  Robin Sanchez YDX:[id number] DOB: [birth year] DOA: 05/31/2020 PCP: Patient, No Pcp Per   LOS: 2 days   Brief narrative: Robin Sanchez is a 54 y.o. 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 presented to the ED with complaints of fever headache malaise nausea vomiting and poor oral intake.  Patient did have recent exposure to Covid in a family member.  Patient is unvaccinated.  In the ED, patient was febrile with temperature 101.7, tachycardic and tachypneic.  Her pulse ox was 95% on room air.  She did have leukopenia of 2800, hemoglobin 11.7.  Platelets 125K.  Chemistry was significant for creatinine of 1.92, up from 0.85 two months prior on Care Everywhere.  She also had  elevation in liver enzymes all of which were normal except for alk phos of 132 on Care Everywhere 2 months prior.  Inflammatory biomarkers including LDH ferritin and D-dimer were all elevated.  D-dimer   was 1367.99.  Covid test was positive.  Chest x-ray with mild multifocal bilateral infiltrates right greater than left.Patient started on remdesivir.    Patient was then admitted to hospital for evaluation and treatment of Covid pneumonia and gastroenteritis symptoms.  Assessment/Plan:  Principal Problem:   Pneumonia due to COVID-19 virus Active Problems:   HTN (hypertension)   Kidney transplant status   Systemic lupus erythematosus, unspecified (HCC)   Anemia due to chronic illness   Elevated LFTs   Hyponatremia   AKI (acute kidney injury) ([place])   Pancytopenia ([place])   Multifocal pneumonia due to COVID-19 virus in an immunosuppressed patient.  Currently on remdesivir, albuterol,  antitussives. Patient has a very high risk of developing severe disease due to immunocompromise status..  Lactate was 0.9.  HIV was nonreactive.  Blood cultures were negative so far.     COVID-19  Labs  Recent Labs    05/31/20 2151 06/02/20 0838 06/03/20 0725  FERRITIN 5,485* 7,472* 6,092*  LDH 205*  --   --   CRP 13.3* 11.8*  --     Lab Results  Component Value Date   SARSCOV2NAA POSITIVE (A) 05/31/2020   Elevated D-dimer Secondary to Covid pneumonia.  No hypoxia.  Patient had chest pain with tachycardia yesterday so VQ scan was ordered which was a low suspicion for PE.Marland Kitchen  She was however empirically started on heparin drip which will be discontinued.  We will put back on Lovenox.  D-dimer trending down.  Acute kidney injury secondary to Covid gastroenteritis with volume depletion. Has been having GI symptoms including diarrhea.  No diarrhea after loperamide has been started.  Loperamide has been started.  As per care everywhere , creatinine levels 2 months back was 0.8 as per Care Everywhere.  Follow BMP closely.  Creatinine from 11/27 at 1.3.  We will continue 1 more day of gentle IV fluids since patient has poor oral intake.  Add PPI due to bad reflux disease.  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  At this time, we will continue since the patient does not seem to be overtly septic and does not have hypoxic respiratory failure or severe Covid illness. Creatinine levels 2 months back was 0.8 as per Care Everywhere.  Patient follows up with [facility] transplant team.  Continue gentle IV fluids.  Essential hypertension On chlorthalidone at home.  holding chlorthalidone at this time due to acute  kidney injury, continue on as needed hydralazine    Systemic lupus erythematosus, unspecified ([place]) Chronic and stable at this time.  Pancytopenia WBC 2800, platelets 125,000 and Hb 11.7 on presentation.  Will monitor closely.    Elevated LFTs AST/ALT 94/70 respectively on presentation.  Mild elevation on presentation..  Continue to monitor.  Could be secondary to viral illness.  Trending down.    Hyponatremia Received IV normal saline bolus and on IV fluids.  Sodium has normalized to 136 today.    DVT prophylaxis:   Code Status: Full code  Family Communication: Unable to reach family member on the phone listed.  Status is: Inpatient  Remains inpatient appropriate because:IV treatments appropriate due to intensity of illness or inability to take PO, Inpatient level of care appropriate due to severity of illness and Covid pneumonia in immunocompromised patient   Dispo: The patient is from: Home              Anticipated d/c is to: Home              Anticipated d/c date is: 1 to 2 days              Patient currently is not medically stable to d/c.  Consultants:  None  Procedures:  None  Antibiotics:  . Remdesivir 11/24>  Subjective:  Today, patient states that she has epigastric discomfort nausea and poor oral intake.  Diarrhea has improved after taking loperamide yesterday.  Denies any chest pain, coughing shortness of breath  Objective: Vitals:   06/03/20 0431 06/03/20 0741  BP: 122/77 113/64  Pulse:  88  Resp: 19 18  Temp: 98.3 F (36.8 C) 98.2 F (36.8 C)  SpO2: 95% 95%    Intake/Output Summary (Last 24 hours) at 06/03/2020 0805 Last data filed at 06/03/2020 0300 Gross per 24 hour  Intake 220 ml  Output 200 ml  Net 20 ml   Filed Weights   06/01/20 0157 06/02/20 0425 06/03/20 0259  Weight: 67.3 kg 65.4 kg 68.8 kg   Body mass index is 35.26 kg/m.   Physical Exam:  General: Obese built, not in obvious distress, on nasal cannula oxygen for comfort HENT:   No scleral pallor or icterus noted. Oral mucosa is moist.  Chest: Decreased breath sounds bilaterally, coarse breath sounds CVS: S1 &S2 heard. No murmur.  Regular rate and rhythm. Abdomen: Soft, mild epigastric tenderness noted, nondistended.  Bowel sounds are heard.   Extremities: No cyanosis, clubbing or edema.  Peripheral pulses are palpable. Psych: Alert, awake and oriented, normal  mood CNS:  No cranial nerve deficits.  Power equal in all extremities.   Skin: Warm and dry.  No rashes noted.   Data Review: I have personally reviewed the following laboratory data and studies,  CBC: Recent Labs  Lab 05/31/20 1840 06/02/20 0838  WBC 2.8* 2.3*  NEUTROABS  --  1.1*  HGB 11.7* 10.6*  HCT 38.3 34.3*  MCV 60.9* 60.1*  PLT 125* 706*   Basic Metabolic Panel: Recent Labs  Lab 05/31/20 1840 05/31/20 2151 06/02/20 0838  NA 127* 128* 133*  K 3.8 3.8 4.0  CL 93* 93* 101  CO2 16* 16* 17*  GLUCOSE 92 92 87  BUN 34* 38* 43*  CREATININE 1.85* 1.92* 1.82*  CALCIUM 8.9 9.0 9.0  MG  --   --  1.8   Liver Function Tests: Recent Labs  Lab 05/31/20 2151 06/02/20 0838  AST 94* 87*  ALT 70* 69*  ALKPHOS 93  80  BILITOT 0.7 1.0  PROT 7.7 6.9  ALBUMIN 4.4 3.9   No results for input(s): LIPASE, AMYLASE in the last 168 hours. No results for input(s): AMMONIA in the last 168 hours. Cardiac Enzymes: No results for input(s): CKTOTAL, CKMB, CKMBINDEX, TROPONINI in the last 168 hours. BNP (last 3 results) No results for input(s): BNP in the last 8760 hours.  ProBNP (last 3 results) No results for input(s): PROBNP in the last 8760 hours.  CBG: No results for input(s): GLUCAP in the last 168 hours. Recent Results (from the past 240 hour(s))  Resp Panel by RT-PCR (Flu A&B, Covid) Nasopharyngeal Swab     Status: Abnormal   Collection Time: 05/31/20  9:02 PM   Specimen: Nasopharyngeal Swab; Nasopharyngeal(NP) swabs in vial transport medium  Result Value Ref Range Status   SARS Coronavirus 2 by RT PCR POSITIVE (A) NEGATIVE Final    Comment: RESULT CALLED TO, READ BACK BY AND VERIFIED WITH: RUSSELL A. _0  ON 05/31/20 SKL (NOTE) SARS-CoV-2 target nucleic acids are DETECTED.  The SARS-CoV-2 RNA is generally detectable in upper respiratory specimens during the acute phase of infection. Positive results are indicative of the presence of the identified virus, but do not  rule out bacterial infection or co-infection with other pathogens not detected by the test. Clinical correlation with patient history and other diagnostic information is necessary to determine patient infection status. The expected result is Negative.  Fact Sheet for Patients: EntrepreneurPulse.com.au  Fact Sheet for Healthcare Providers: IncredibleEmployment.be  This test is not yet approved or cleared by the [place] FDA and  has been authorized for detection and/or diagnosis of SARS-CoV-2 by FDA under an Emergency Use Authorization (EUA).  This EUA will remain in effect (meaning this test can be  used) for the duration of  the COVID-19 declaration under Section 564(b)(1) of the Act, 21 U.S.C. section 360bbb-3(b)(1), unless the authorization is terminated or revoked sooner.     Influenza A by PCR NEGATIVE NEGATIVE Final   Influenza B by PCR NEGATIVE NEGATIVE Final    Comment: (NOTE) The Xpert Xpress SARS-CoV-2/FLU/RSV plus assay is intended as an aid in the diagnosis of influenza from Nasopharyngeal swab specimens and should not be used as a sole basis for treatment. Nasal washings and aspirates are unacceptable for Xpert Xpress SARS-CoV-2/FLU/RSV testing.  Fact Sheet for Patients: EntrepreneurPulse.com.au  Fact Sheet for Healthcare Providers: IncredibleEmployment.be  This test is not yet approved or cleared by the [place] FDA and has been authorized for detection and/or diagnosis of SARS-CoV-2 by FDA under an Emergency Use Authorization (EUA). This EUA will remain in effect (meaning this test can be used) for the duration of the COVID-19 declaration under Section 564(b)(1) of the Act, 21 U.S.C. section 360bbb-3(b)(1), unless the authorization is terminated or revoked.  Performed at [facility], [address] [place], [place] [address]   Blood Culture (routine x 2)      Status: None (Preliminary result)   Collection Time: 05/31/20  9:51 PM   Specimen: BLOOD  Result Value Ref Range Status   Specimen Description BLOOD BLOOD RIGHT HAND  Final   Special Requests   Final    BOTTLES DRAWN AEROBIC AND ANAEROBIC Blood Culture adequate volume   Culture   Final    NO GROWTH 3 DAYS Performed at [facility], [address] [place], [place] [address]    Report Status PENDING  Incomplete  Blood Culture (routine x 2)     Status: None (  Preliminary result)   Collection Time: 05/31/20  9:51 PM   Specimen: BLOOD  Result Value Ref Range Status   Specimen Description BLOOD RIGHT ANTECUBITAL  Final   Special Requests   Final    BOTTLES DRAWN AEROBIC AND ANAEROBIC Blood Culture adequate volume   Culture   Final    NO GROWTH 3 DAYS Performed at [facility], [address] [place], [place] [address]    Report Status PENDING  Incomplete     Studies: DG Chest 1 View  Result Date: 06/02/2020 CLINICAL DATA:  Chest pain.  COVID positive. EXAM: CHEST  1 VIEW COMPARISON:  May 31, 2020 FINDINGS: Mild, ill-defined, bilateral multifocal infiltrates are seen. This is very mildly increased in severity when compared to the prior study. There is no evidence of a pleural effusion or pneumothorax. The heart size and mediastinal contours are within normal limits. Radiopaque vascular stents are seen overlying the superior mediastinum and left axilla. The visualized skeletal structures are unremarkable. IMPRESSION: Mild, bilateral multifocal infiltrates, mildly increased in severity when compared to the prior study. Electronically Signed   By: Virgina Norfolk M.D.   On: 06/02/2020 18:11      Flora Lipps, MD  [facility] 06/03/2020

## 2020-06-03 NOTE — Consult Note (Signed)
[facility] for Heparin Infusion Indication: High clinical suspicion for PE  Patient Measurements: Heparin Dosing Weight: 49.9 kg   Labs: Recent Labs    05/31/20 1840 05/31/20 1840 05/31/20 2151 06/02/20 0838 06/02/20 1830 06/03/20 0725 06/03/20 0727  HGB 11.7*   < >  --  10.6*  --  9.0*  --   HCT 38.3  --   --  34.3*  --  29.3*  --   PLT 125*  --   --  123*  --  119*  --   APTT  --   --   --   --  59*  --   --   LABPROT  --   --   --   --  14.6  --   --   INR  --   --   --   --  1.2  --   --   HEPARINUNFRC  --   --   --   --   --   --  1.01*  CREATININE 1.85*   < > 1.92* 1.82*  --  1.34*  --    < > = values in this interval not displayed.    Estimated Creatinine Clearance: 35.9 mL/min (A) (by C-G formula based on SCr of 1.34 mg/dL (H)).   Medical History: Past Medical History:  Diagnosis Date  . Anemia   . Cancer ([place])    cervical  . Chronic kidney disease    dialysis t,t,s  . Dysrhythmia   . GERD (gastroesophageal reflux disease)   . Hypertension   . Lupus ([place])    currently in remission  . Osteoporosis   . Peripheral vascular disease ([place])   . Shortness of breath dyspnea    occas    Medications:  No anticoagulation prior to admission per chart review  Assessment: Patient is a 54 y/o F with medical history as above who presented to the ED 11/24 with weakness / lethargy in setting of known SARS-CoV-2 positivity. Patient was subsequently admitted for COVID-19 pneumonia. D-dimer 1367 >> 1173. Pharmacy has been consulted to initiate heparin infusion for high clinical suspicion of PE. VQ scan ordered for further evaluate.   Today, heparin level is above therapeutic range at 1.01. Hgb and platelets decrease from previous at 9 and 119, respectively. SCr has improved since admission, down to 1.34. No issues with the infusion or overt bleeding noted per RN. Patient is currently off the floor, but RN advised that she will call the  patient's current location ([facility]) to have them hold the heparin infusion.   Goal of Therapy:  Heparin level 0.3-0.7 units/ml Monitor platelets by anticoagulation protocol: Yes   Plan:  Hold heparin infusion for 1 hour Then restart heparin infusion at 650 units per hour Check 6 hour heparin level Monitor CBC, daily heparin level  Continue to monitor for signs/symptoms of bleeding F/u VQ scan results   Brendolyn Patty, PharmD Clinical Pharmacist  06/03/2020   10:14 AM

## 2020-06-04 ENCOUNTER — Inpatient Hospital Stay: Payer: Medicaid Other

## 2020-06-04 DIAGNOSIS — U071 COVID-19: Secondary | ICD-10-CM | POA: Diagnosis not present

## 2020-06-04 DIAGNOSIS — N179 Acute kidney failure, unspecified: Secondary | ICD-10-CM | POA: Diagnosis not present

## 2020-06-04 DIAGNOSIS — D638 Anemia in other chronic diseases classified elsewhere: Secondary | ICD-10-CM | POA: Diagnosis not present

## 2020-06-04 DIAGNOSIS — R7989 Other specified abnormal findings of blood chemistry: Secondary | ICD-10-CM | POA: Diagnosis not present

## 2020-06-04 LAB — FIBRIN DERIVATIVES D-DIMER (ARMC ONLY): Fibrin derivatives D-dimer (ARMC): 706.99 ng/mL (FEU) — ABNORMAL HIGH (ref 0.00–499.00)

## 2020-06-04 LAB — COMPREHENSIVE METABOLIC PANEL
ALT: 44 U/L (ref 0–44)
AST: 55 U/L — ABNORMAL HIGH (ref 15–41)
Albumin: 3.3 g/dL — ABNORMAL LOW (ref 3.5–5.0)
Alkaline Phosphatase: 72 U/L (ref 38–126)
Anion gap: 13 (ref 5–15)
BUN: 24 mg/dL — ABNORMAL HIGH (ref 6–20)
CO2: 14 mmol/L — ABNORMAL LOW (ref 22–32)
Calcium: 7.8 mg/dL — ABNORMAL LOW (ref 8.9–10.3)
Chloride: 110 mmol/L (ref 98–111)
Creatinine, Ser: 1.26 mg/dL — ABNORMAL HIGH (ref 0.44–1.00)
GFR, Estimated: 50 mL/min — ABNORMAL LOW (ref >60)
Glucose, Bld: 75 mg/dL (ref 70–99)
Potassium: 3.8 mmol/L (ref 3.5–5.1)
Sodium: 137 mmol/L (ref 135–145)
Total Bilirubin: 1 mg/dL (ref 0.3–1.2)
Total Protein: 5.8 g/dL — ABNORMAL LOW (ref 6.5–8.1)

## 2020-06-04 LAB — CBC WITH DIFFERENTIAL/PLATELET
Abs Immature Granulocytes: 0.11 10*3/uL — ABNORMAL HIGH (ref 0.00–0.07)
Basophils Absolute: 0 10*3/uL (ref 0.0–0.1)
Basophils Relative: 0 %
Eosinophils Absolute: 0 10*3/uL (ref 0.0–0.5)
Eosinophils Relative: 0 %
HCT: 31.8 % — ABNORMAL LOW (ref 36.0–46.0)
Hemoglobin: 9.8 g/dL — ABNORMAL LOW (ref 12.0–15.0)
Immature Granulocytes: 4 %
Lymphocytes Relative: 33 %
Lymphs Abs: 0.9 10*3/uL (ref 0.7–4.0)
MCH: 18.8 pg — ABNORMAL LOW (ref 26.0–34.0)
MCHC: 30.8 g/dL (ref 30.0–36.0)
MCV: 60.9 fL — ABNORMAL LOW (ref 80.0–100.0)
Monocytes Absolute: 0.4 10*3/uL (ref 0.1–1.0)
Monocytes Relative: 15 %
Neutro Abs: 1.3 10*3/uL — ABNORMAL LOW (ref 1.7–7.7)
Neutrophils Relative %: 48 %
Platelets: 135 10*3/uL — ABNORMAL LOW (ref 150–400)
RBC: 5.22 MIL/uL — ABNORMAL HIGH (ref 3.87–5.11)
RDW: 16.4 % — ABNORMAL HIGH (ref 11.5–15.5)
WBC: 2.8 10*3/uL — ABNORMAL LOW (ref 4.0–10.5)
nRBC: 0 % (ref 0.0–0.2)

## 2020-06-04 LAB — FERRITIN: Ferritin: 6531 ng/mL — ABNORMAL HIGH (ref 11–307)

## 2020-06-04 LAB — MAGNESIUM: Magnesium: 1.4 mg/dL — ABNORMAL LOW (ref 1.7–2.4)

## 2020-06-04 LAB — C-REACTIVE PROTEIN: CRP: 9.8 mg/dL — ABNORMAL HIGH (ref <1.0)

## 2020-06-04 LAB — LIPASE, BLOOD: Lipase: 26 U/L (ref 11–51)

## 2020-06-04 MED ORDER — MAGNESIUM SULFATE 2 GM/50ML IV SOLN
2.0000 g | Freq: Once | INTRAVENOUS | Status: AC
  Administered 2020-06-04: 2 g via INTRAVENOUS
  Filled 2020-06-04: qty 50

## 2020-06-04 MED ORDER — SUCRALFATE 1 GM/10ML PO SUSP
1.0000 g | Freq: Three times a day (TID) | ORAL | Status: DC
  Administered 2020-06-04 – 2020-06-06 (×6): 1 g via ORAL
  Filled 2020-06-04 (×12): qty 10

## 2020-06-04 MED ORDER — HYDROMORPHONE HCL 1 MG/ML IJ SOLN: 0.5000 mg | INTRAMUSCULAR | Status: DC | PRN

## 2020-06-04 MED ORDER — DICYCLOMINE HCL 10 MG PO CAPS
10.0000 mg | ORAL_CAPSULE | Freq: Three times a day (TID) | ORAL | Status: DC
  Administered 2020-06-04 – 2020-06-06 (×6): 10 mg via ORAL
  Filled 2020-06-04 (×12): qty 1

## 2020-06-04 MED ORDER — ENOXAPARIN SODIUM 40 MG/0.4ML ~~LOC~~ SOLN
40.0000 mg | SUBCUTANEOUS | Status: DC
  Administered 2020-06-04 – 2020-06-05 (×2): 40 mg via SUBCUTANEOUS
  Filled 2020-06-04 (×2): qty 0.4

## 2020-06-04 NOTE — TOC Progression Note (Addendum)
Transition of Care [facility]) - Progression Note    Patient Details  Name: Latina Frank MRN: [id number] Date of Birth: 29-Jun-1966  Transition of [facility]) CM/SW Contact  Izola Price, RN Phone Number: 06/04/2020, 2:35 PM  Clinical Narrative:   11/28 New Admit 11/24 for Covid + pneumonia. Significant PMI for SLE, CKD, HTN, kidney transplant. Was independent PTA. No TOC consult. Fatigue and weakness today per PT notes. PT evaluation recommended HH PT with Rolling Walker w/ 5" wheels.  Continue to monitor. Has a PCP. Simmie Davies RN CM          Expected Discharge Plan and Services                                                 Social Determinants of Health (SDOH) Interventions    Readmission Risk Interventions No flowsheet data found.

## 2020-06-04 NOTE — Evaluation (Addendum)
Physical Therapy Evaluation Patient Details Name: Robin Sanchez MRN: [id number] DOB: [birth year] Today's Date: 06/04/2020   History of Present Illness  Pt is a 54 y/o F admitted on 05/31/20 with c/o several days of HA, fever & body aches, vomiting & diarrhea with hx of recent covid exposure. Pt admitted for tx of PNA 2/2 Covid 19 & gastroenteritis symptoms. PMH: SLE, HTN, kidney transplant April 2018 2/2 lupus nephritis on tacrolimus, mycophenolate & cinacalcet, anemia, cervical CA, CKD, dysrhythmia, GERD, HTN, osteoporosis, PVD  Clinical Impression  Pt pleasant & agreeable to tx. Pt with behaviors demonstrating abdominal pain during session - nurse aware reporting pt is going for imaging later today. Prior to admission pt was independent without AD. Pt currently only able to transfer to [facility] with cuing for hand placement & sequencing (pt initially pulled BSC directly in front of her with PT placing it away to allow pt room to stand pivot); pt with continent void on toilet & performs peri hygiene without assistance. Pt then ambulates ~2 ft BSC>recliner with min assist with pt reaching for UE support throughout. Pt declines further ambulation at this time, noting increased fatigue. PT encouraged pt to ambulate to bathroom with nursing staff when she feels up to it. Pt would benefit from ongoing acute PT services to address endurance & balance, progress gait training with LRAD, and initiate stair training as able.  Pt on room air throughout session & SpO2 >90%.    Follow Up Recommendations Home health PT;Supervision/Assistance - 24 hour    Equipment Recommendations  Rolling walker with 5" wheels    Recommendations for Other Services       Precautions / Restrictions Precautions Precautions: Fall Restrictions Weight Bearing Restrictions: No      Mobility  Bed Mobility Overal bed mobility: Modified Independent             General bed mobility comments: supine>sit     Transfers Overall transfer level: Needs assistance   Transfers: Sit to/from Stand;Stand Pivot Transfers Sit to Stand: Min guard Stand pivot transfers: Min guard       General transfer comment: cuing for safe hand placement  Ambulation/Gait Ambulation/Gait assistance: Min guard Gait Distance (Feet): 2 Feet Assistive device: None   Gait velocity: decreased      Stairs            Wheelchair Mobility    Modified Rankin (Stroke Patients Only)       Balance Overall balance assessment: Needs assistance Sitting-balance support: Feet supported Sitting balance-Leahy Scale: Good Sitting balance - Comments: pt able to sit on BSC without assistance without LOB     Standing balance-Leahy Scale: Poor Standing balance comment: pt reaching for UE support when ambulating ~2 ft to recliner                             Pertinent Vitals/Pain Pain Assessment: Faces Faces Pain Scale: Hurts even more Pain Location: abdomen Pain Descriptors / Indicators: Aching Pain Intervention(s): Limited activity within patient's tolerance (nurse made aware)    Home Living Family/patient expects to be discharged to:: Private residence Living Arrangements: Children Available Help at Discharge: Family;Available PRN/intermittently Type of Home: Mobile home Home Access: Stairs to enter Entrance Stairs-Rails: Right;Left;Can reach both Entrance Stairs-Number of Steps: 3 Home Layout: One level Home Equipment: None      Prior Function Level of Independence: Independent         Comments: without AD  Hand Dominance        Extremity/Trunk Assessment   Upper Extremity Assessment Upper Extremity Assessment: Generalized weakness    Lower Extremity Assessment Lower Extremity Assessment: Generalized weakness       Communication   Communication: No difficulties  Cognition Arousal/Alertness: Awake/alert Behavior During Therapy: WFL for tasks  assessed/performed Overall Cognitive Status: Within Functional Limits for tasks assessed                                        General Comments      Exercises     Assessment/Plan    PT Assessment Patient needs continued PT services  PT Problem List Decreased strength;Decreased mobility;Decreased safety awareness;Decreased knowledge of precautions;Decreased activity tolerance;Cardiopulmonary status limiting activity;Decreased knowledge of use of DME;Decreased balance       PT Treatment Interventions DME instruction;Therapeutic exercise;Manual techniques;Balance training;Gait training;Stair training;Neuromuscular re-education;Functional mobility training;Therapeutic activities;Patient/family education    PT Goals (Current goals can be found in the Care Plan section)  Acute Rehab PT Goals Patient Stated Goal: none stated PT Goal Formulation: With patient Time For Goal Achievement: 06/18/20 Potential to Achieve Goals: Good    Frequency Min 2X/week   Barriers to discharge        Co-evaluation               AM-PAC PT "6 Clicks" Mobility  Outcome Measure Help needed turning from your back to your side while in a flat bed without using bedrails?: None Help needed moving from lying on your back to sitting on the side of a flat bed without using bedrails?: None Help needed moving to and from a bed to a chair (including a wheelchair)?: A Little Help needed standing up from a chair using your arms (e.g., wheelchair or bedside chair)?: A Little Help needed to walk in hospital room?: A Little Help needed climbing 3-5 steps with a railing? : A Lot 6 Click Score: 19    End of Session Equipment Utilized During Treatment: Gait belt Activity Tolerance: Patient tolerated treatment well;Patient limited by fatigue Patient left: in chair;with call bell/phone within reach;with chair alarm set Nurse Communication: Mobility status (behaviors demonstrating abdominal  pain) PT Visit Diagnosis: Unsteadiness on feet (R26.81);Difficulty in walking, not elsewhere classified (R26.2);Muscle weakness (generalized) (M62.81)    Time: 8563-1497 PT Time Calculation (min) (ACUTE ONLY): 20 min   Charges:     PT Treatments $Therapeutic Activity: 8-22 mins        Lavone Nian, PT, DPT 06/04/20, 12:10 PM   Waunita Schooner 06/04/2020, 12:07 PM

## 2020-06-04 NOTE — Progress Notes (Addendum)
PROGRESS NOTE  Robin Sanchez KGY:[id number] DOB: [birth year] DOA: 05/31/2020 PCP: Patient, No Pcp Per   LOS: 3 days   Brief narrative: Robin Sanchez is a 54 y.o. 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 presented to the ED with complaints of fever headache malaise nausea vomiting and poor oral intake.  Patient did have recent exposure to Covid in a family member.  Patient is unvaccinated.  In the ED, patient was febrile with temperature 101.7, tachycardic and tachypneic.  Her pulse ox was 95% on room air.  She did have leukopenia of 2800, hemoglobin 11.7.  Platelets 125K.  Chemistry was significant for creatinine of 1.92, up from 0.85 two months prior on Care Everywhere.  She also had  elevation in liver enzymes all of which were normal except for alk phos of 132 on Care Everywhere 2 months prior.  Inflammatory biomarkers including LDH ferritin and D-dimer were all elevated.  D-dimer   was 1367.99.  Covid test was positive.  Chest x-ray with mild multifocal bilateral infiltrates right greater than left. Patient was started on remdesivir and was admitted to hospital for evaluation and treatment of Covid pneumonia and gastroenteritis symptoms.  Assessment/Plan:  Principal Problem:   Pneumonia due to COVID-19 virus Active Problems:   HTN (hypertension)   Kidney transplant status   Systemic lupus erythematosus, unspecified (HCC)   Anemia due to chronic illness   Elevated LFTs   Hyponatremia   AKI (acute kidney injury) ([place])   Pancytopenia ([place])  Multifocal pneumonia due to COVID-19 virus in an immunosuppressed patient.  Continue remdesivir, albuterol,  antitussives. Patient has a very high risk of developing severe disease due to immunocompromise status..  Lactate was 0.9.  HIV was nonreactive.  Blood cultures negative so far  COVID-19 Labs  Recent Labs     06/02/20 0838 06/03/20 0725 06/03/20 0727 06/04/20 0531  FERRITIN 7,472* 6,092*  --  6,531*  CRP 11.8*  --  8.2* 9.8*    Lab Results  Component Value Date   SARSCOV2NAA POSITIVE (A) 05/31/2020   Elevated D-dimer Secondary to Covid pneumonia.  No hypoxia.   VQ scan was ordered which was a low suspicion for PE.Marland Kitchen    D-dimer trending down. 719>706.  On prophylactic dose of Lovenox.  Acute kidney injury secondary to Covid gastroenteritis with volume depletion.  Follow BMP closely.  Creatinine from 11/28 at 1.2.  Continue gentle IV fluids for now.     Epigastric pain/nausea, vomiting.  History of GERD. No diarrhea after loperamide but has moderate to severe epigastric pain nausea especially after taking pills.  Pain is however constant and was not able to sleep overnight.  Has poor oral intake due to epigastric pain.  Added Protonix low-dose yesterday.  Will obtain a CT scan of the abdomen pelvis and lipase.  Add sucralfate as well.  Patient states that she has history of GERD in the past but has not had EGD.  Might need GI evaluation if symptoms persist.  We will change the diet to soft bland diet.  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  Continue for now.  Creatinine levels 2 months back was 0.8 as per Care Everywhere.  Patient follows up with [facility] transplant team.  Continue gentle IV fluids.  Essential hypertension On chlorthalidone at home.  holding chlorthalidone at this time due to acute kidney injury,  continue on as needed hydralazine.  Blood pressure seems to be stable    Systemic lupus erythematosus, unspecified (HCC) Chronic and stable at this time.  Pancytopenia  on presentation.  Will monitor closely.    Elevated LFTs AST/ALT 94/70 respectively on presentation.  Mild elevation on presentation..  Continue to monitor.  Could be secondary to viral illness.  Continues to trend down.    Hyponatremia Received IV normal saline bolus and on IV fluids.  Sodium has normalized at this time.   DVT prophylaxis: Lovenox subcu  Code Status: Full code  Family Communication: I was still unable to reach family member on the phone listed.  Status is: Inpatient  Remains inpatient appropriate because:IV treatments appropriate due to intensity of illness or inability to take PO, Inpatient level of care appropriate due to severity of illness and Covid pneumonia in immunocompromised patient, epigastric pain needing further evaluation.   Dispo: The patient is from: Home              Anticipated d/c is to: Home              Anticipated d/c date is: 1 to 2 days              Patient currently is not medically stable to d/c.  Consultants:  None  Procedures:  None  Antibiotics:  . Remdesivir 11/24>  Subjective:  Today, patient states that she has epigastric discomfort nausea and poor oral intake.  Diarrhea has improved after taking loperamide yesterday.  Denies any chest pain, coughing shortness of breath  Objective: Vitals:   06/04/20 0800 06/04/20 0900  BP: 113/65   Pulse:    Resp: 19   Temp:  98.7 F (37.1 C)  SpO2:  96%    Intake/Output Summary (Last 24 hours) at 06/04/2020 1109 Last data filed at 06/04/2020 0602 Gross per 24 hour  Intake 2598.42 ml  Output 500 ml  Net 2098.42 ml   Filed Weights   06/02/20 0425 06/03/20 0259 06/04/20 0424  Weight: 65.4 kg 68.8 kg 72.3 kg   Body mass index is 37.05 kg/m.   Physical Exam: General: Obese built, in mild distress due to epigastric pain, on room air HENT:   No scleral pallor or icterus noted. Oral mucosa is moist.  Chest: Decreased breath sounds bilateral.  CVS: S1 &S2 heard. No murmur.  Regular rate and rhythm. Abdomen: Soft, epigastric tenderness noted on palpation.  Nondistended.  Bowel sounds are heard.   Extremities: No cyanosis, clubbing or edema.  Peripheral pulses are palpable. Psych: Alert, awake and  oriented, normal mood CNS:  No cranial nerve deficits.  Power equal in all extremities.   Skin: Warm and dry.  No rashes noted.   Data Review: I have personally reviewed the following laboratory data and studies,  CBC: Recent Labs  Lab 05/31/20 1840 06/02/20 0838 06/03/20 0725 06/04/20 0531  WBC 2.8* 2.3* 2.0* 2.8*  NEUTROABS  --  1.1* 0.8* 1.3*  HGB 11.7* 10.6* 9.0* 9.8*  HCT 38.3 34.3* 29.3* 31.8*  MCV 60.9* 60.1* 61.0* 60.9*  PLT 125* 123* 119* 440*   Basic Metabolic Panel: Recent Labs  Lab 05/31/20 1840 05/31/20 2151 06/02/20 0838 06/03/20 0725 06/04/20 0531  NA 127* 128* 133* 136 137  K 3.8 3.8 4.0 4.0 3.8  CL 93* 93* 101 107 110  CO2 16* 16* 17* 17* 14*  GLUCOSE 92 92 87 91 75  BUN 34* 38* 43* 33* 24*  CREATININE 1.85* 1.92* 1.82* 1.34*  1.26*  CALCIUM 8.9 9.0 9.0 7.9* 7.8*  MG  --   --  1.8 1.5* 1.4*   Liver Function Tests: Recent Labs  Lab 05/31/20 2151 06/02/20 0838 06/03/20 0725 06/04/20 0531  AST 94* 87* 63* 55*  ALT 70* 69* 51* 44  ALKPHOS 93 80 70 72  BILITOT 0.7 1.0 0.7 1.0  PROT 7.7 6.9 5.8* 5.8*  ALBUMIN 4.4 3.9 3.3* 3.3*   No results for input(s): LIPASE, AMYLASE in the last 168 hours. No results for input(s): AMMONIA in the last 168 hours. Cardiac Enzymes: No results for input(s): CKTOTAL, CKMB, CKMBINDEX, TROPONINI in the last 168 hours. BNP (last 3 results) No results for input(s): BNP in the last 8760 hours.  ProBNP (last 3 results) No results for input(s): PROBNP in the last 8760 hours.  CBG: No results for input(s): GLUCAP in the last 168 hours. Recent Results (from the past 240 hour(s))  Resp Panel by RT-PCR (Flu A&B, Covid) Nasopharyngeal Swab     Status: Abnormal   Collection Time: 05/31/20  9:02 PM   Specimen: Nasopharyngeal Swab; Nasopharyngeal(NP) swabs in vial transport medium  Result Value Ref Range Status   SARS Coronavirus 2 by RT PCR POSITIVE (A) NEGATIVE Final    Comment: RESULT CALLED TO, READ BACK BY AND  VERIFIED WITH: RUSSELL A. @2330  ON 05/31/20 SKL (NOTE) SARS-CoV-2 target nucleic acids are DETECTED.  The SARS-CoV-2 RNA is generally detectable in upper respiratory specimens during the acute phase of infection. Positive results are indicative of the presence of the identified virus, but do not rule out bacterial infection or co-infection with other pathogens not detected by the test. Clinical correlation with patient history and other diagnostic information is necessary to determine patient infection status. The expected result is Negative.  Fact Sheet for Patients: EntrepreneurPulse.com.au  Fact Sheet for Healthcare Providers: IncredibleEmployment.be  This test is not yet approved or cleared by the [place] FDA and  has been authorized for detection and/or diagnosis of SARS-CoV-2 by FDA under an Emergency Use Authorization (EUA).  This EUA will remain in effect (meaning this test can be  used) for the duration of  the COVID-19 declaration under Section 564(b)(1) of the Act, 21 U.S.C. section 360bbb-3(b)(1), unless the authorization is terminated or revoked sooner.     Influenza A by PCR NEGATIVE NEGATIVE Final   Influenza B by PCR NEGATIVE NEGATIVE Final    Comment: (NOTE) The Xpert Xpress SARS-CoV-2/FLU/RSV plus assay is intended as an aid in the diagnosis of influenza from Nasopharyngeal swab specimens and should not be used as a sole basis for treatment. Nasal washings and aspirates are unacceptable for Xpert Xpress SARS-CoV-2/FLU/RSV testing.  Fact Sheet for Patients: EntrepreneurPulse.com.au  Fact Sheet for Healthcare Providers: IncredibleEmployment.be  This test is not yet approved or cleared by the [place] FDA and has been authorized for detection and/or diagnosis of SARS-CoV-2 by FDA under an Emergency Use Authorization (EUA). This EUA will remain in effect (meaning this test can  be used) for the duration of the COVID-19 declaration under Section 564(b)(1) of the Act, 21 U.S.C. section 360bbb-3(b)(1), unless the authorization is terminated or revoked.  Performed at [facility], [address] [place], [place] [address]   Blood Culture (routine x 2)     Status: None (Preliminary result)   Collection Time: 05/31/20  9:51 PM   Specimen: BLOOD  Result Value Ref Range Status   Specimen Description BLOOD BLOOD RIGHT HAND  Final   Special Requests  Final    BOTTLES DRAWN AEROBIC AND ANAEROBIC Blood Culture adequate volume   Culture   Final    NO GROWTH 4 DAYS Performed at [facility], [address] [place], [place] [address]    Report Status PENDING  Incomplete  Blood Culture (routine x 2)     Status: None (Preliminary result)   Collection Time: 05/31/20  9:51 PM   Specimen: BLOOD  Result Value Ref Range Status   Specimen Description BLOOD RIGHT ANTECUBITAL  Final   Special Requests   Final    BOTTLES DRAWN AEROBIC AND ANAEROBIC Blood Culture adequate volume   Culture   Final    NO GROWTH 4 DAYS Performed at [facility], [address] [place], [place] [address]    Report Status PENDING  Incomplete     Studies: DG Chest 1 View  Result Date: 06/02/2020 CLINICAL DATA:  Chest pain.  COVID positive. EXAM: CHEST  1 VIEW COMPARISON:  May 31, 2020 FINDINGS: Mild, ill-defined, bilateral multifocal infiltrates are seen. This is very mildly increased in severity when compared to the prior study. There is no evidence of a pleural effusion or pneumothorax. The heart size and mediastinal contours are within normal limits. Radiopaque vascular stents are seen overlying the superior mediastinum and left axilla. The visualized skeletal structures are unremarkable. IMPRESSION: Mild, bilateral multifocal infiltrates, mildly increased in severity when compared to the prior study. Electronically Signed   By: Virgina Norfolk M.D.   On:  06/02/2020 18:11   NM Pulmonary Perfusion  Result Date: 06/03/2020 CLINICAL DATA:  Evaluate for pulmonary embolus. Shortness of breath and sharp pain and upper medial abdomen. History of kidney transplant. EXAM: NUCLEAR MEDICINE PERFUSION LUNG SCAN TECHNIQUE: Perfusion images were obtained in multiple projections after intravenous injection of radiopharmaceutical. Ventilation scans intentionally deferred if perfusion scan and chest x-ray adequate for interpretation during COVID 19 epidemic. RADIOPHARMACEUTICALS:  4.04 mCi Tc-80mMAA IV COMPARISON:  06/02/2020 FINDINGS: There is a heterogeneous distribution of the radiopharmaceutical throughout both lungs. No medium or large peripheral segmental perfusion defects identified to suggest acute pulmonary embolus. IMPRESSION: 1. No findings to suggest acute pulmonary embolus. Electronically Signed   By: TKerby MoorsM.D.   On: 06/03/2020 12:14      LFlora Lipps MD  [facility] 06/04/2020

## 2020-06-04 NOTE — Progress Notes (Signed)
Made Dr. Louanne Belton aware patient is unable to drink contrast. Only had a little and already unable to keep down. Per md he is okay with her not drink contrast.

## 2020-06-05 ENCOUNTER — Other Ambulatory Visit: Payer: Self-pay

## 2020-06-05 DIAGNOSIS — R7989 Other specified abnormal findings of blood chemistry: Secondary | ICD-10-CM | POA: Diagnosis not present

## 2020-06-05 DIAGNOSIS — R1013 Epigastric pain: Secondary | ICD-10-CM | POA: Diagnosis not present

## 2020-06-05 DIAGNOSIS — D638 Anemia in other chronic diseases classified elsewhere: Secondary | ICD-10-CM | POA: Diagnosis not present

## 2020-06-05 DIAGNOSIS — U071 COVID-19: Secondary | ICD-10-CM | POA: Diagnosis not present

## 2020-06-05 DIAGNOSIS — N179 Acute kidney failure, unspecified: Secondary | ICD-10-CM | POA: Diagnosis not present

## 2020-06-05 LAB — CBC WITH DIFFERENTIAL/PLATELET
Abs Immature Granulocytes: 0.16 10*3/uL — ABNORMAL HIGH (ref 0.00–0.07)
Basophils Absolute: 0 10*3/uL (ref 0.0–0.1)
Basophils Relative: 0 %
Eosinophils Absolute: 0 10*3/uL (ref 0.0–0.5)
Eosinophils Relative: 0 %
HCT: 30.9 % — ABNORMAL LOW (ref 36.0–46.0)
Hemoglobin: 9.7 g/dL — ABNORMAL LOW (ref 12.0–15.0)
Immature Granulocytes: 6 %
Lymphocytes Relative: 29 %
Lymphs Abs: 0.9 10*3/uL (ref 0.7–4.0)
MCH: 19 pg — ABNORMAL LOW (ref 26.0–34.0)
MCHC: 31.4 g/dL (ref 30.0–36.0)
MCV: 60.5 fL — ABNORMAL LOW (ref 80.0–100.0)
Monocytes Absolute: 0.5 10*3/uL (ref 0.1–1.0)
Monocytes Relative: 17 %
Neutro Abs: 1.4 10*3/uL — ABNORMAL LOW (ref 1.7–7.7)
Neutrophils Relative %: 48 %
Platelets: 133 10*3/uL — ABNORMAL LOW (ref 150–400)
RBC: 5.11 MIL/uL (ref 3.87–5.11)
RDW: 16.2 % — ABNORMAL HIGH (ref 11.5–15.5)
Smear Review: NORMAL
WBC: 2.9 10*3/uL — ABNORMAL LOW (ref 4.0–10.5)
nRBC: 0 % (ref 0.0–0.2)

## 2020-06-05 LAB — COMPREHENSIVE METABOLIC PANEL
ALT: 41 U/L (ref 0–44)
AST: 48 U/L — ABNORMAL HIGH (ref 15–41)
Albumin: 3.3 g/dL — ABNORMAL LOW (ref 3.5–5.0)
Alkaline Phosphatase: 75 U/L (ref 38–126)
Anion gap: 12 (ref 5–15)
BUN: 22 mg/dL — ABNORMAL HIGH (ref 6–20)
CO2: 16 mmol/L — ABNORMAL LOW (ref 22–32)
Calcium: 8 mg/dL — ABNORMAL LOW (ref 8.9–10.3)
Chloride: 110 mmol/L (ref 98–111)
Creatinine, Ser: 1.35 mg/dL — ABNORMAL HIGH (ref 0.44–1.00)
GFR, Estimated: 46 mL/min — ABNORMAL LOW (ref >60)
Glucose, Bld: 86 mg/dL (ref 70–99)
Potassium: 4 mmol/L (ref 3.5–5.1)
Sodium: 138 mmol/L (ref 135–145)
Total Bilirubin: 1 mg/dL (ref 0.3–1.2)
Total Protein: 5.7 g/dL — ABNORMAL LOW (ref 6.5–8.1)

## 2020-06-05 LAB — CULTURE, BLOOD (ROUTINE X 2)
Culture: NO GROWTH
Culture: NO GROWTH
Special Requests: ADEQUATE
Special Requests: ADEQUATE

## 2020-06-05 LAB — MAGNESIUM: Magnesium: 1.5 mg/dL — ABNORMAL LOW (ref 1.7–2.4)

## 2020-06-05 LAB — FERRITIN: Ferritin: 5885 ng/mL — ABNORMAL HIGH (ref 11–307)

## 2020-06-05 LAB — PATHOLOGIST SMEAR REVIEW

## 2020-06-05 LAB — C-REACTIVE PROTEIN: CRP: 9.3 mg/dL — ABNORMAL HIGH (ref <1.0)

## 2020-06-05 LAB — D-DIMER, QUANTITATIVE: D-Dimer, Quant: 0.59 ug/mL-FEU — ABNORMAL HIGH (ref 0.00–0.50)

## 2020-06-05 MED ORDER — PANTOPRAZOLE SODIUM 40 MG IV SOLR
40.0000 mg | Freq: Two times a day (BID) | INTRAVENOUS | Status: DC
  Administered 2020-06-05 – 2020-06-06 (×3): 40 mg via INTRAVENOUS
  Filled 2020-06-05 (×3): qty 40

## 2020-06-05 MED ORDER — MAGNESIUM SULFATE 2 GM/50ML IV SOLN
2.0000 g | Freq: Once | INTRAVENOUS | Status: AC
  Administered 2020-06-05: 2 g via INTRAVENOUS
  Filled 2020-06-05: qty 50

## 2020-06-05 NOTE — Progress Notes (Addendum)
PROGRESS NOTE  Robin Sanchez JOI:[id number] DOB: [birth year] DOA: 05/31/2020 PCP: Patient, No Pcp Per   LOS: 4 days   Brief narrative: Robin Sanchez is a 54 y.o. 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  Patient did have recent exposure to Covid in a family member.  Patient is unvaccinated.  In the ED, patient was febrile with temperature 101.7, tachycardic and tachypneic.  Her pulse ox was 95% on room air.  She did have leukopenia of 2800, hemoglobin 11.7.  Platelets 125K.  Chemistry was significant for creatinine of 1.92, up from 0.85 two months prior on Care Everywhere.  She also had  elevation in liver enzymes all of which were normal except for alk phos of 132 on Care Everywhere 2 months prior.  Inflammatory biomarkers including LDH ferritin and D-dimer were all elevated.  D-dimer   was 1367.99.  Covid test was positive.  Chest x-ray with mild multifocal bilateral infiltrates right greater than left. Patient was started on remdesivir and was admitted to hospital for evaluation and treatment of Covid pneumonia and gastroenteritis symptoms.  Assessment/Plan:  Principal Problem:   Pneumonia due to COVID-19 virus Active Problems:   HTN (hypertension)   Kidney transplant status   Systemic lupus erythematosus, unspecified (HCC)   Anemia due to chronic illness   Elevated LFTs   Hyponatremia   AKI (acute kidney injury) ([place])   Pancytopenia ([place])  Multifocal pneumonia due to COVID-19 virus in an immunosuppressed patient.  Continue remdesivir, albuterol,  antitussives. Patient has a very high risk of developing severe disease due to immunocompromised status..  Lactate was 0.9.  HIV was nonreactive.  Blood cultures negative so far.  COVID-19 Labs  Recent Labs     06/02/20 0838 06/02/20 0838 06/03/20 0725 06/03/20 0727 06/04/20 0531 06/05/20 0457  FERRITIN 7,472*   < > 6,092*  --  6,531* 5,885*  CRP 11.8*  --   --  8.2* 9.8*  --    < > = values in this interval not displayed.    Lab Results  Component Value Date   SARSCOV2NAA POSITIVE (A) 05/31/2020   Elevated D-dimer Secondary to Covid pneumonia.  No hypoxia.   VQ scan was low suspicion for PE.   D-dimer trending down. 719>706.  Continue prophylactic dose Lovenox.  Acute kidney injury secondary to Covid gastroenteritis with volume depletion.  Follow BMP closely.  Received IV fluids.  Creatinine of 1.3 at this time.  Epigastric pain/nausea, vomiting.  History of GERD. Here to ED due to lower esophageal epigastric pain.  Pain is new in the hospital.  On Protonix sucralfate dicyclomine.  Never had EGD in the past.  Will get GI evaluation.  Communicated with GI on call.  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  Continue for now.  Creatinine levels 2 months back was 0.8 as per Care Everywhere.  Patient follows up with [facility] transplant team.  Continue gentle IV fluids at this time due to poor oral intake.  Essential hypertension On chlorthalidone at home.  holding chlorthalidone at this time due to acute kidney injury, continue on as needed hydralazine.  Blood pressure seems to be stable    Systemic lupus erythematosus, unspecified (HCC) Chronic and stable at this time.  Pancytopenia  on presentation.  Will monitor closely.    Elevated LFTs AST/ALT 94/70 respectively on presentation.  Mild elevation on presentation..  Continue to monitor.  Could be secondary to viral illness.  Continues to trend down.   Hyponatremia Received IV normal saline bolus and on IV fluids.  Sodium has normalized at this time.  Latest sodium of 138.   DVT prophylaxis: Lovenox subcu  Addendum:  06/05/2020 2:23 PM  Noted input from GI.  GI  recommends an upper GI study.  Will order.  Code Status: Full code  Family Communication: Unable to reach the the family members on the phone numbers listed.  Status is: Inpatient  Remains inpatient appropriate because:IV treatments appropriate due to intensity of illness or inability to take PO, Inpatient level of care appropriate due to severity of illness and Covid pneumonia in immunocompromised patient, GI consultation for epigastric pain   Dispo: The patient is from: Home              Anticipated d/c is to: Home              Anticipated d/c date is: 1 to 2 days              Patient currently is not medically stable to d/c.  Consultants:  GI  Procedures:  None  Antibiotics:  . Remdesivir 11/24>  Subjective:  Today, patient was seen and examined at bedside.  Complains of mild epigastric Discomfort and pain.  Denies any shortness of breath, cough, fever or chills.  Objective: Vitals:   06/04/20 2009 06/05/20 0324  BP: 127/74 123/76  Pulse: 100 93  Resp: 19 19  Temp: 98.7 F (37.1 C) 98.1 F (36.7 C)  SpO2: 94% 95%    Intake/Output Summary (Last 24 hours) at 06/05/2020 0819 Last data filed at 06/05/2020 0445 Gross per 24 hour  Intake 1065.93 ml  Output 1 ml  Net 1064.93 ml   Filed Weights   06/03/20 0259 06/04/20 0424 06/05/20 0445  Weight: 68.8 kg 72.3 kg 73.6 kg   Body mass index is 37.7 kg/m.   Physical Exam:  General: Obese built, not in obvious distress, on room air HENT:   No scleral pallor or icterus noted. Oral mucosa is moist.  Chest: Decreased breath sounds bilaterally.  No wheezes noted. CVS: S1 &S2 heard. No murmur.  Regular rate and rhythm. Abdomen: Soft, mild epigastric tenderness noted on deep palpation.  Nondistended abdomen.  Bowel sounds are present.   Extremities: No cyanosis, clubbing or edema.  Peripheral pulses are palpable. Psych: Alert, awake and oriented, normal mood CNS:  No cranial nerve deficits.  Power equal in all  extremities.   Skin: Warm and dry.  No rashes noted.   Data Review: I have personally reviewed the following laboratory data and studies,  CBC: Recent Labs  Lab 05/31/20 1840 06/02/20 0838 06/03/20 0725 06/04/20 0531 06/05/20 0457  WBC 2.8* 2.3* 2.0* 2.8* 2.9*  NEUTROABS  --  1.1* 0.8* 1.3* 1.4*  HGB 11.7* 10.6* 9.0* 9.8* 9.7*  HCT 38.3 34.3* 29.3* 31.8* 30.9*  MCV 60.9* 60.1* 61.0* 60.9* 60.5*  PLT 125* 123* 119* 135* 191*   Basic Metabolic Panel: Recent Labs  Lab 05/31/20 2151 06/02/20 0838 06/03/20 0725 06/04/20 0531 06/05/20 0457  NA 128* 133* 136 137 138  K 3.8 4.0 4.0 3.8 4.0  CL 93* 101 107 110 110  CO2 16* 17* 17* 14* 16*  GLUCOSE 92 87 91 75 86  BUN 38* 43* 33* 24* 22*  CREATININE 1.92* 1.82* 1.34* 1.26* 1.35*  CALCIUM 9.0 9.0 7.9* 7.8* 8.0*  MG  --  1.8 1.5* 1.4* 1.5*   Liver Function Tests: Recent Labs  Lab 05/31/20 2151 06/02/20 0838 06/03/20 0725 06/04/20 0531 06/05/20 0457  AST 94* 87* 63* 55* 48*  ALT 70* 69* 51* 44 41  ALKPHOS 93 80 70 72 75  BILITOT 0.7 1.0 0.7 1.0 1.0  PROT 7.7 6.9 5.8* 5.8* 5.7*  ALBUMIN 4.4 3.9 3.3* 3.3* 3.3*   Recent Labs  Lab 06/04/20 0531  LIPASE 26   No results for input(s): AMMONIA in the last 168 hours. Cardiac Enzymes: No results for input(s): CKTOTAL, CKMB, CKMBINDEX, TROPONINI in the last 168 hours. BNP (last 3 results) No results for input(s): BNP in the last 8760 hours.  ProBNP (last 3 results) No results for input(s): PROBNP in the last 8760 hours.  CBG: No results for input(s): GLUCAP in the last 168 hours. Recent Results (from the past 240 hour(s))  Resp Panel by RT-PCR (Flu A&B, Covid) Nasopharyngeal Swab     Status: Abnormal   Collection Time: 05/31/20  9:02 PM   Specimen: Nasopharyngeal Swab; Nasopharyngeal(NP) swabs in vial transport medium  Result Value Ref Range Status   SARS Coronavirus 2 by RT PCR POSITIVE (A) NEGATIVE Final    Comment: RESULT CALLED TO, READ BACK BY AND VERIFIED  WITH: RUSSELL A. @2330  ON 05/31/20 SKL (NOTE) SARS-CoV-2 target nucleic acids are DETECTED.  The SARS-CoV-2 RNA is generally detectable in upper respiratory specimens during the acute phase of infection. Positive results are indicative of the presence of the identified virus, but do not rule out bacterial infection or co-infection with other pathogens not detected by the test. Clinical correlation with patient history and other diagnostic information is necessary to determine patient infection status. The expected result is Negative.  Fact Sheet for Patients: EntrepreneurPulse.com.au  Fact Sheet for Healthcare Providers: IncredibleEmployment.be  This test is not yet approved or cleared by the [place] FDA and  has been authorized for detection and/or diagnosis of SARS-CoV-2 by FDA under an Emergency Use Authorization (EUA).  This EUA will remain in effect (meaning this test can be  used) for the duration of  the COVID-19 declaration under Section 564(b)(1) of the Act, 21 U.S.C. section 360bbb-3(b)(1), unless the authorization is terminated or revoked sooner.     Influenza A by PCR NEGATIVE NEGATIVE Final   Influenza B by PCR NEGATIVE NEGATIVE Final    Comment: (NOTE) The Xpert Xpress SARS-CoV-2/FLU/RSV plus assay is intended as an aid in the diagnosis of influenza from Nasopharyngeal swab specimens and should not be used as a sole basis for treatment. Nasal washings and aspirates are unacceptable for Xpert Xpress SARS-CoV-2/FLU/RSV testing.  Fact Sheet for Patients: EntrepreneurPulse.com.au  Fact Sheet for Healthcare Providers: IncredibleEmployment.be  This test is not yet approved or cleared by the [place] FDA and has been authorized for detection and/or diagnosis of SARS-CoV-2 by FDA under an Emergency Use Authorization (EUA). This EUA will remain in effect (meaning this test can be used)  for the duration of the COVID-19 declaration under Section 564(b)(1) of the Act, 21 U.S.C. section 360bbb-3(b)(1), unless the authorization is terminated or revoked.  Performed at [facility], [address] [place],  [address]   Blood Culture (routine x 2)     Status: None   Collection Time: 05/31/20  9:51 PM   Specimen: BLOOD  Result Value Ref Range Status   Specimen Description BLOOD BLOOD  RIGHT HAND  Final   Special Requests   Final    BOTTLES DRAWN AEROBIC AND ANAEROBIC Blood Culture adequate volume   Culture   Final    NO GROWTH 5 DAYS Performed at [facility], [address] [place], [place] [address]    Report Status 06/05/2020 FINAL  Final  Blood Culture (routine x 2)     Status: None   Collection Time: 05/31/20  9:51 PM   Specimen: BLOOD  Result Value Ref Range Status   Specimen Description BLOOD RIGHT ANTECUBITAL  Final   Special Requests   Final    BOTTLES DRAWN AEROBIC AND ANAEROBIC Blood Culture adequate volume   Culture   Final    NO GROWTH 5 DAYS Performed at [facility], [address] [place], [place] [address]    Report Status 06/05/2020 FINAL  Final     Studies: CT ABDOMEN PELVIS WO CONTRAST  Result Date: 06/04/2020 CLINICAL DATA:  Abdominal pain. EXAM: CT ABDOMEN AND PELVIS WITHOUT CONTRAST TECHNIQUE: Multidetector CT imaging of the abdomen and pelvis was performed following the standard protocol without IV contrast. COMPARISON:  Chest radiograph dated 06/02/2020. FINDINGS: Lower chest: Moderate patchy bilateral areas of ground-glass opacity and consolidation are partially imaged, consistent with COVID-19 pneumonia. Hepatobiliary: No focal liver abnormality is seen. No gallstones, gallbladder wall thickening, or biliary dilatation. Pancreas: Unremarkable. No pancreatic ductal dilatation or surrounding inflammatory changes. Spleen: Normal in size without focal abnormality. Adrenals/Urinary Tract: Adrenal glands are  unremarkable. Both native kidneys are atrophic. No focal lesion is identified in either native kidney. A right lower quadrant transplant kidney is noted without obstructing calculus or hydronephrosis. There is no significant surrounding fat stranding of the transplant kidney. A 4 mm calcification along the peripheral aspect of the cortex of the transplant kidney is noted in may reflect prior injury/scar. The urinary bladder is unremarkable. Stomach/Bowel: Stomach is within normal limits. Appendix appears normal. No evidence of bowel wall thickening, distention, or inflammatory changes. Vascular/Lymphatic: Aortic atherosclerosis. No enlarged abdominal or pelvic lymph nodes. Reproductive: Uterus and bilateral adnexa are unremarkable. Other: No abdominal wall hernia or abnormality. No abdominopelvic ascites. 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 No definite subchondral fracture or cortical collapse is identified. IMPRESSION: 1. Moderate patchy bilateral areas of ground-glass opacity and consolidation are partially imaged, consistent with COVID-19 pneumonia. 2. Right lower quadrant transplant kidney without obstructing calculus or hydronephrosis. 3. 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 No definite subchondral fracture or cortical collapse is identified. Aortic Atherosclerosis (ICD10-I70.0). Electronically Signed   By: Zerita Boers M.D.   On: 06/04/2020 14:26   NM Pulmonary Perfusion  Result Date: 06/03/2020 CLINICAL DATA:  Evaluate for pulmonary embolus. Shortness of breath and sharp pain and upper medial abdomen. History of kidney transplant. EXAM: NUCLEAR MEDICINE PERFUSION LUNG SCAN TECHNIQUE: Perfusion images were obtained in multiple projections after intravenous injection of radiopharmaceutical. Ventilation scans intentionally deferred if perfusion scan and chest x-ray  adequate for interpretation during COVID 19 epidemic. RADIOPHARMACEUTICALS:  4.04 mCi Tc-63mMAA IV COMPARISON:  06/02/2020 FINDINGS: There is a heterogeneous distribution of the radiopharmaceutical throughout both lungs. No medium or large peripheral segmental perfusion defects identified to suggest acute pulmonary embolus. IMPRESSION: 1. No findings to suggest acute pulmonary embolus. Electronically Signed   By: TKerby MoorsM.D.   On: 06/03/2020 12:14      LFlora Lipps MD  [facility] 06/05/2020

## 2020-06-05 NOTE — Consult Note (Addendum)
Robin Antigua, MD [address] [place], [place], [place], [address] [address], [place], [place], [place], [address] Phone: [phone]  Fax: [phone]  Consultation  Referring Provider:     Dr. Louanne Belton Primary Care Physician:  Patient, No Pcp Per Reason for Consultation:    Epigastric pain   Date of Admission:  05/31/2020 Date of Consultation:  06/05/2020         HPI:   Robin Sanchez is a 54 y.o. female with history of SLE, renal transplant in 2018, on tacrolimus, mycophenolate, admitted with COVID-19, however currently receiving remdesivir, with GI being consulted for epigastric pain.  Patient reports symptoms ongoing for 1 year.  She reports epigastric pain to be dull, nonradiating, 5/10, present constantly with worsening with meals.  Is present even without meals.  Patient denies any dysphagia.  Able to eat solids and liquids without difficulty.  However, feels that after eating, food sits in this top of her stomach and she feels nauseous and sometimes has vomiting.  No hematemesis.  No melena or hematochezia.  Reports remote colonoscopy 7 to 10 years ago and some polyps were found.  Procedure report not available.  Never had an upper endoscopy.  Labs show immunosuppression.  However, review of her chart (records personally reviewed), does show a nephrology office note from September 2021 that reports "immunosuppression.  Mycophenolate sodium 540 mg twice daily.  Tacrolimus 12-hour IVl 5 to 8 NG per mL"  Past Medical History:  Diagnosis Date  . Anemia   . Cancer ([place])    cervical  . Chronic kidney disease    dialysis t,t,s  . Dysrhythmia   . GERD (gastroesophageal reflux disease)   . Hypertension   . Lupus ([place])    currently in remission  . Osteoporosis   . Peripheral vascular disease ([place])   . Shortness of breath dyspnea    occas    Past Surgical History:  Procedure Laterality Date  . ARTERIOVENOUS GRAFT PLACEMENT     currently dialysis access in right  thigh  . DILATION AND CURETTAGE OF UTERUS    . PERIPHERAL VASCULAR CATHETERIZATION N/A 11/13/2015   Procedure: A/V Shuntogram/Fistulagram;  Surgeon: Algernon Huxley, MD;  Location: [facility];  Service: Cardiovascular;  Laterality: N/A;  . PERIPHERAL VASCULAR CATHETERIZATION  11/13/2015   Procedure: Dialysis/Perma Catheter Insertion;  Surgeon: Algernon Huxley, MD;  Location: [facility];  Service: Cardiovascular;;  . PERIPHERAL VASCULAR CATHETERIZATION N/A 01/18/2016   Procedure: Dialysis/Perma Catheter Removal;  Surgeon: Algernon Huxley, MD;  Location: [facility];  Service: Cardiovascular;  Laterality: N/A;  . REVISION OF ARTERIOVENOUS GORETEX GRAFT Left 11/23/2015   Procedure: REVISION OF ARTERIOVENOUS GORETEX GRAFT;  Surgeon: Algernon Huxley, MD;  Location: [facility];  Service: Vascular;  Laterality: Left;  . STENTS     MULTIPLE DIALYSIS STENTS    Prior to Admission medications   Medication Sig Start Date End Date Taking? Authorizing Provider  chlorthalidone (HYGROTON) 25 MG tablet Take 12.5 mg by mouth in the morning. 07/06/19 07/05/20 Yes [provider]  cinacalcet (SENSIPAR) 60 MG tablet Take 60 mg by mouth daily.   Yes [provider]  magnesium oxide (MAG-OX) 400 MG tablet Take 400 mg by mouth.   Yes [provider]  mycophenolate (MYFORTIC) 180 MG EC tablet Take 540 mg by mouth 2 (two) times daily. 04/14/20 04/14/21 Yes [provider]  tacrolimus (PROGRAF) 0.5 MG capsule Take 1.5-2 mg by mouth 2 (  two) times daily. Take 2 mg (4 tablets) in the morning and 1.5 mg (3 tablets) in the evening. 05/06/17  Yes [provider]    History reviewed. No pertinent family history.   Social History   Tobacco Use  . Smoking status: Never Smoker  . Smokeless tobacco: Never Used  Substance Use Topics  . Alcohol use: No  . Drug use: No    Allergies as of 05/31/2020 - Review Complete 05/31/2020  Allergen Reaction Noted  . Hydroxychloroquine  Other (See Comments) 04/26/2016  . Shellfish allergy Shortness Of Breath and Swelling 11/13/2015  . Bleomycin  11/13/2015  . Penicillins Nausea And Vomiting 11/13/2015  . Aspirin Rash 11/13/2015  . Vancomycin Rash 11/23/2015    Review of Systems:    All systems reviewed and negative except where noted in HPI.   Physical Exam:  Vital signs in last 24 hours: Vitals:   06/05/20 0822 06/05/20 0823 06/05/20 0900 06/05/20 1042  BP: (!) 141/88   128/78  Pulse:    89  Resp:   19 20  Temp:  98 F (36.7 C)  98.4 F (36.9 C)  TempSrc:    Oral  SpO2:  95%  95%  Weight:      Height:       Last BM Date: 06/05/20 General:   Pleasant, cooperative in NAD Head:  Normocephalic and atraumatic. Eyes:   No icterus.   Conjunctiva pink. PERRLA. Ears:  Normal auditory acuity. Neck:  Supple; no masses or thyroidomegaly Lungs: Respirations even and unlabored. Lungs clear to auscultation bilaterally.   No wheezes, crackles, or rhonchi.  Abdomen:  Soft, nondistended, nontender. Normal bowel sounds. No appreciable masses or hepatomegaly.  No rebound or guarding.  Neurologic:  Alert and oriented x3;  grossly normal neurologically. Skin:  Intact without significant lesions or rashes. Cervical Nodes:  No significant cervical adenopathy. Psych:  Alert and cooperative. Normal affect.  LAB RESULTS: Recent Labs    06/03/20 0725 06/04/20 0531 06/05/20 0457  WBC 2.0* 2.8* 2.9*  HGB 9.0* 9.8* 9.7*  HCT 29.3* 31.8* 30.9*  PLT 119* 135* 133*   BMET Recent Labs    06/03/20 0725 06/04/20 0531 06/05/20 0457  NA 136 137 138  K 4.0 3.8 4.0  CL 107 110 110  CO2 17* 14* 16*  GLUCOSE 91 75 86  BUN 33* 24* 22*  CREATININE 1.34* 1.26* 1.35*  CALCIUM 7.9* 7.8* 8.0*   LFT Recent Labs    06/05/20 0457  PROT 5.7*  ALBUMIN 3.3*  AST 48*  ALT 41  ALKPHOS 75  BILITOT 1.0   PT/INR Recent Labs    06/02/20 1830  LABPROT 14.6  INR 1.2    STUDIES: CT ABDOMEN PELVIS WO CONTRAST  Result Date:  06/04/2020 CLINICAL DATA:  Abdominal pain. EXAM: CT ABDOMEN AND PELVIS WITHOUT CONTRAST TECHNIQUE: Multidetector CT imaging of the abdomen and pelvis was performed following the standard protocol without IV contrast. COMPARISON:  Chest radiograph dated 06/02/2020. FINDINGS: Lower chest: Moderate patchy bilateral areas of ground-glass opacity and consolidation are partially imaged, consistent with COVID-19 pneumonia. Hepatobiliary: No focal liver abnormality is seen. No gallstones, gallbladder wall thickening, or biliary dilatation. Pancreas: Unremarkable. No pancreatic ductal dilatation or surrounding inflammatory changes. Spleen: Normal in size without focal abnormality. Adrenals/Urinary Tract: Adrenal glands are unremarkable. Both native kidneys are atrophic. No focal lesion is identified in either native kidney. A right lower quadrant transplant kidney is noted without obstructing calculus or hydronephrosis. There is no significant surrounding fat  stranding of the transplant kidney. A 4 mm calcification along the peripheral aspect of the cortex of the transplant kidney is noted in may reflect prior injury/scar. The urinary bladder is unremarkable. Stomach/Bowel: Stomach is within normal limits. Appendix appears normal. No evidence of bowel wall thickening, distention, or inflammatory changes. Vascular/Lymphatic: Aortic atherosclerosis. No enlarged abdominal or pelvic lymph nodes. Reproductive: Uterus and bilateral adnexa are unremarkable. Other: No abdominal wall hernia or abnormality. No abdominopelvic ascites. 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 No definite subchondral fracture or cortical collapse is identified. IMPRESSION: 1. Moderate patchy bilateral areas of ground-glass opacity and consolidation are partially imaged, consistent with COVID-19 pneumonia. 2. Right lower quadrant transplant kidney without obstructing calculus or  hydronephrosis. 3. 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 No definite subchondral fracture or cortical collapse is identified. Aortic Atherosclerosis (ICD10-I70.0). Electronically Signed   By: Zerita Boers M.D.   On: 06/04/2020 14:26   NM Pulmonary Perfusion  Result Date: 06/03/2020 CLINICAL DATA:  Evaluate for pulmonary embolus. Shortness of breath and sharp pain and upper medial abdomen. History of kidney transplant. EXAM: NUCLEAR MEDICINE PERFUSION LUNG SCAN TECHNIQUE: Perfusion images were obtained in multiple projections after intravenous injection of radiopharmaceutical. Ventilation scans intentionally deferred if perfusion scan and chest x-ray adequate for interpretation during COVID 19 epidemic. RADIOPHARMACEUTICALS:  4.04 mCi Tc-63m MAA IV COMPARISON:  06/02/2020 FINDINGS: There is a heterogeneous distribution of the radiopharmaceutical throughout both lungs. No medium or large peripheral segmental perfusion defects identified to suggest acute pulmonary embolus. IMPRESSION: 1. No findings to suggest acute pulmonary embolus. Electronically Signed   By: Kerby Moors M.D.   On: 06/03/2020 12:14      Impression / Plan:   Robin Sanchez is a 54 y.o. y/o female with admission for COVID-19 good bilateral consolidation and pneumonia on imaging, with GI consulted for Epigastric pain  Given bilateral infiltrates, and COVID-19, endoscopic procedures with sedation would be higher risks than benefits in this setting as it can lead to hypoxia during the procedure.  Patient is hemodynamically stable, without any signs of active GI bleeding  Would recommend further evaluation of her symptoms with upper GI study as that would be a noninvasive way of evaluating for any large lesions in her upper GI tract and the location of her pain.  This would be lower risk than an upper endoscopy  In addition, she has pancytopenia likely from her medications.   Consider discussing this with nephrology and/or hematology  Patient is on PPI once daily at this time.  Will increase to twice daily and change to IV dosage the next few days and then can be changed to p.o. doses once pain improves  If upper GI study shows any concerning lesions can consider upper endoscopy at that time  If upper GI study is normal, elective upper endoscopy can be done when patient is better medically optimized and upper endoscopy with sedation can be done safely, as outpatient the next few weeks after treatment of acute pneumonia  Liver enzymes have improved since admission.  Pattern of elevation not consistent with biliary obstruction.  CT report otherwise normal in regard to the stomach/bowel, hepatobiliary and pancreas  Thank you for involving me in the care of this patient.      LOS: 4 days   Virgel Manifold, MD  06/05/2020, 11:47 AM

## 2020-06-06 ENCOUNTER — Inpatient Hospital Stay: Payer: Medicaid Other

## 2020-06-06 ENCOUNTER — Telehealth: Payer: Self-pay | Admitting: Gastroenterology

## 2020-06-06 DIAGNOSIS — U071 COVID-19: Secondary | ICD-10-CM | POA: Diagnosis not present

## 2020-06-06 DIAGNOSIS — R7989 Other specified abnormal findings of blood chemistry: Secondary | ICD-10-CM | POA: Diagnosis not present

## 2020-06-06 DIAGNOSIS — E871 Hypo-osmolality and hyponatremia: Secondary | ICD-10-CM

## 2020-06-06 DIAGNOSIS — N179 Acute kidney failure, unspecified: Secondary | ICD-10-CM | POA: Diagnosis not present

## 2020-06-06 DIAGNOSIS — D638 Anemia in other chronic diseases classified elsewhere: Secondary | ICD-10-CM | POA: Diagnosis not present

## 2020-06-06 LAB — CBC WITH DIFFERENTIAL/PLATELET
Abs Immature Granulocytes: 0.21 10*3/uL — ABNORMAL HIGH (ref 0.00–0.07)
Basophils Absolute: 0 10*3/uL (ref 0.0–0.1)
Basophils Relative: 0 %
Eosinophils Absolute: 0 10*3/uL (ref 0.0–0.5)
Eosinophils Relative: 1 %
HCT: 29.9 % — ABNORMAL LOW (ref 36.0–46.0)
Hemoglobin: 9.3 g/dL — ABNORMAL LOW (ref 12.0–15.0)
Immature Granulocytes: 6 %
Lymphocytes Relative: 20 %
Lymphs Abs: 0.7 10*3/uL (ref 0.7–4.0)
MCH: 18.6 pg — ABNORMAL LOW (ref 26.0–34.0)
MCHC: 31.1 g/dL (ref 30.0–36.0)
MCV: 59.8 fL — ABNORMAL LOW (ref 80.0–100.0)
Monocytes Absolute: 0.7 10*3/uL (ref 0.1–1.0)
Monocytes Relative: 19 %
Neutro Abs: 2.1 10*3/uL (ref 1.7–7.7)
Neutrophils Relative %: 54 %
Platelets: 172 10*3/uL (ref 150–400)
RBC: 5 MIL/uL (ref 3.87–5.11)
RDW: 16.4 % — ABNORMAL HIGH (ref 11.5–15.5)
Smear Review: NORMAL
WBC: 3.8 10*3/uL — ABNORMAL LOW (ref 4.0–10.5)
nRBC: 0 % (ref 0.0–0.2)

## 2020-06-06 LAB — COMPREHENSIVE METABOLIC PANEL
ALT: 37 U/L (ref 0–44)
AST: 41 U/L (ref 15–41)
Albumin: 3.3 g/dL — ABNORMAL LOW (ref 3.5–5.0)
Alkaline Phosphatase: 74 U/L (ref 38–126)
Anion gap: 13 (ref 5–15)
BUN: 25 mg/dL — ABNORMAL HIGH (ref 6–20)
CO2: 17 mmol/L — ABNORMAL LOW (ref 22–32)
Calcium: 8 mg/dL — ABNORMAL LOW (ref 8.9–10.3)
Chloride: 108 mmol/L (ref 98–111)
Creatinine, Ser: 1.39 mg/dL — ABNORMAL HIGH (ref 0.44–1.00)
GFR, Estimated: 45 mL/min — ABNORMAL LOW (ref >60)
Glucose, Bld: 95 mg/dL (ref 70–99)
Potassium: 4 mmol/L (ref 3.5–5.1)
Sodium: 138 mmol/L (ref 135–145)
Total Bilirubin: 1 mg/dL (ref 0.3–1.2)
Total Protein: 6 g/dL — ABNORMAL LOW (ref 6.5–8.1)

## 2020-06-06 LAB — MAGNESIUM: Magnesium: 1.5 mg/dL — ABNORMAL LOW (ref 1.7–2.4)

## 2020-06-06 LAB — FERRITIN: Ferritin: 4430 ng/mL — ABNORMAL HIGH (ref 11–307)

## 2020-06-06 LAB — D-DIMER, QUANTITATIVE: D-Dimer, Quant: 0.67 ug/mL-FEU — ABNORMAL HIGH (ref 0.00–0.50)

## 2020-06-06 LAB — C-REACTIVE PROTEIN: CRP: 7.3 mg/dL — ABNORMAL HIGH (ref <1.0)

## 2020-06-06 MED ORDER — MAGNESIUM OXIDE 400 (241.3 MG) MG PO TABS
400.0000 mg | ORAL_TABLET | Freq: Two times a day (BID) | ORAL | Status: DC
  Administered 2020-06-06: 400 mg via ORAL
  Filled 2020-06-06: qty 1

## 2020-06-06 MED ORDER — GUAIFENESIN-DM 100-10 MG/5ML PO SYRP: 10.0000 mL | ORAL_SOLUTION | ORAL | 0 refills | Status: AC | PRN

## 2020-06-06 MED ORDER — PANTOPRAZOLE SODIUM 40 MG PO TBEC: 40.0000 mg | DELAYED_RELEASE_TABLET | Freq: Two times a day (BID) | ORAL | 1 refills | Status: AC

## 2020-06-06 MED ORDER — HYDROCODONE-ACETAMINOPHEN 5-325 MG PO TABS: 1.0000 | ORAL_TABLET | Freq: Four times a day (QID) | ORAL | 0 refills | Status: AC | PRN

## 2020-06-06 MED ORDER — ONDANSETRON HCL 4 MG PO TABS: 4.0000 mg | ORAL_TABLET | Freq: Four times a day (QID) | ORAL | 0 refills | Status: AC | PRN

## 2020-06-06 MED ORDER — MAGNESIUM OXIDE 400 MG PO TABS: 400.0000 mg | ORAL_TABLET | Freq: Every day | ORAL | Status: AC

## 2020-06-06 MED ORDER — ALBUTEROL SULFATE HFA 108 (90 BASE) MCG/ACT IN AERS: 2.0000 | INHALATION_SPRAY | Freq: Four times a day (QID) | RESPIRATORY_TRACT | 0 refills | Status: AC

## 2020-06-06 MED ORDER — DICYCLOMINE HCL 10 MG PO CAPS: 10.0000 mg | ORAL_CAPSULE | Freq: Three times a day (TID) | ORAL | 0 refills | Status: AC

## 2020-06-06 MED ORDER — SUCRALFATE 1 G PO TABS: 1.0000 g | ORAL_TABLET | Freq: Four times a day (QID) | ORAL | 0 refills | Status: AC

## 2020-06-06 NOTE — Telephone Encounter (Signed)
Left voicemail on [facility] family phone for patient to call back. Per Dr. Bonna Gains through secure chat on 11.30.21 she wants pt scheduled for 3-4 week Hosp follow up from 11.30.21. Pt has covid so "virtual appt is best" per provider. Pt was scheduled for 12.30.21 @1 :45pm for virtual phone visit. Please verify when pt calls back. FYI

## 2020-06-06 NOTE — Plan of Care (Signed)
Pt ready for discharge Discharge instructions reviewed with patient Time allowed for questions and concerns Verbalizes an understanding.  Denies any additional wants or needs at this time IV and tele removed.  Awaiting patient transportation home  Problem: Education: Goal: Knowledge of General Education information will improve Description: Including pain rating scale, medication(s)/side effects and non-pharmacologic comfort measures Outcome: Adequate for Discharge   Problem: Health Behavior/Discharge Planning: Goal: Ability to manage health-related needs will improve Outcome: Adequate for Discharge   Problem: Clinical Measurements: Goal: Ability to maintain clinical measurements within normal limits will improve Outcome: Adequate for Discharge Goal: Will remain free from infection Outcome: Adequate for Discharge Goal: Diagnostic test results will improve Outcome: Adequate for Discharge Goal: Respiratory complications will improve Outcome: Adequate for Discharge Goal: Cardiovascular complication will be avoided Outcome: Adequate for Discharge   Problem: Activity: Goal: Risk for activity intolerance will decrease Outcome: Adequate for Discharge   Problem: Nutrition: Goal: Adequate nutrition will be maintained Outcome: Adequate for Discharge   Problem: Coping: Goal: Level of anxiety will decrease Outcome: Adequate for Discharge   Problem: Elimination: Goal: Will not experience complications related to bowel motility Outcome: Adequate for Discharge Goal: Will not experience complications related to urinary retention Outcome: Adequate for Discharge   Problem: Pain Managment: Goal: General experience of comfort will improve Outcome: Adequate for Discharge   Problem: Safety: Goal: Ability to remain free from injury will improve Outcome: Adequate for Discharge   Problem: Skin Integrity: Goal: Risk for impaired skin integrity will decrease Outcome: Adequate for  Discharge   Problem: Education: Goal: Knowledge of risk factors and measures for prevention of condition will improve Outcome: Adequate for Discharge   Problem: Coping: Goal: Psychosocial and spiritual needs will be supported Outcome: Adequate for Discharge   Problem: Respiratory: Goal: Will maintain a patent airway Outcome: Adequate for Discharge Goal: Complications related to the disease process, condition or treatment will be avoided or minimized Outcome: Adequate for Discharge

## 2020-06-06 NOTE — Discharge Summary (Addendum)
Physician Discharge Summary  Cher Franzoni OBS:[id number] DOB: 1966-01-03 DOA: 05/31/2020  PCP: Patient, No Pcp Per  Admit date: 05/31/2020 Discharge date: 06/06/2020  Admitted From: Home  Discharge disposition: Home health  Recommendations for Outpatient Follow-Up:   . Follow up with your primary care provider in one week.  . Check CBC, BMP, magnesium in the next visit . Follow-up with your GI doctor, Bonna Gains as scheduled by the clinic.  Might need endoscopy as outpatient if not improved.  Has been empirically started on PPI twice daily. . Follow-up with renal transplant team at [facility] in 2 to 3 weeks . Chlorthalidone has been kept on hold due to electrolyte issues, dehydration mild renal impairment.  Please reassess the need for chlorthalidone at the next visit.  Discharge Diagnosis:   Principal Problem:   Pneumonia due to COVID-19 virus Active Problems:   HTN (hypertension)   Kidney transplant status   Systemic lupus erythematosus, unspecified (HCC)   Anemia due to chronic illness   Elevated LFTs   Hyponatremia   AKI (acute kidney injury) ([place])   Pancytopenia ([place])   Discharge Condition: Improved.  Diet recommendation: Soft and bland diet recommended  Wound care: None.  Code status: Full.   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  Patient did have recent exposure to Covid in a family member.  Patient is unvaccinated.  In the ED, patient was febrile with temperature 101.7, tachycardic and tachypneic.  Her pulse ox was 95% on room air.  She did have leukopenia of 2800, hemoglobin 11.7. Platelets 125K. Chemistry was significant for creatinine of 1.92, up from 0.85 two months  prior on Care Everywhere. She also had  elevation in liver enzymes all of which were normal except for alk phos of 132 on Care Everywhere 2 months prior. Inflammatory biomarkers including LDH ferritin and D-dimer were all elevated. D-dimer  was 1367.99. Covid test was positive. Chest x-ray with mild multifocal bilateral infiltrates right greater than left. Patient was started on remdesivir and was admitted to hospital for evaluation and treatment of Covid pneumonia and gastroenteritis symptoms.   Hospital Course:   Following conditions were addressed during hospitalization as listed below,  Multifocal pneumonia due to COVID-19 virusin an immunosuppressed patient.  Has completed remdesivir course, received albuterol inhaler ,  antitussives during hospitalization including supportive care. Lactate was 0.9.  HIV was nonreactive.  Blood cultures remained negative.  Patient remained in room air and did not require steroids.  Patient will be given albuterol inhaler on discharge. Sepsis has been ruled out.  Elevated D-dimer Secondary to Covid pneumonia.  No hypoxia.   VQ scan was low suspicion for PE.   D-dimer trended down during hospitalization.. 476>546.    Patient did receive the prophylactic dose of Lovenox during hospitalization.  Acute kidney injury secondary to Covid gastroenteritis with volume depletion.   Creatinine of 1.3 at this time and has remained stable.. Creatinine levels 2 months back was 0.8 as per Care Everywhere. Received IV fluids initially.  Will need to follow-up with your primary care provider and renal transplant team as outpatient.  Epigastric pain/nausea, vomiting.  History of GERD. With mild epigastric pain.  Patient was seen by GI during hospitalization who recommended upper GI  study which was negative for acute findings.  Will be prescribed twice daily Protonix as per GI recommendation.  We will continue sucralfate, dicyclomine on discharge..    Communicated with GI  regarding disposition and plan for follow-up.  GI clinic to set up an appointment.  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    This was continued during hospitalization and will continue on  discharge. Patient follows up with [facility] transplant team.    Essential hypertension On chlorthalidone at home.    Would continue to hold chlorthalidone at this time due to volume depletion nausea, vomiting mild AKI and electrolyte issues during hospitalization. .    Monitor clinically as outpatient and resume as necessary.  Systemic lupus erythematosus, unspecified ([place]) Chronic and stable at this time.  Pancytopenia  on presentation.    Chronic.  Will need outpatient follow-up.  Elevated LFTs AST/ALT 94/70 respectively on presentation.  Mild elevation on presentation..   Continue to monitor as outpatient.  Trended down during hospitalization.  Hyponatremia Received IV normal saline bolus and on IV fluids.  Sodium has normalized at this time.  Latest sodium of 138.  Disposition.  At this time, patient is stable for disposition home with home health.  Patient will need to follow-up with her primary care physician, GI and renal transplant team as outpatient.  Medical Consultants:    GI  Procedures:    Upper GI study Subjective:   Today, patient was seen and examined at bedside.  Feels overall okay.  Has mild discomfort at the epigastric region.  No nausea, vomiting, fever or chills.  Wishes to go home  Discharge Exam:   Vitals:   06/06/20 0830 06/06/20 1200  BP: 128/82 (!) 131/92  Pulse: 82   Resp: 13 18  Temp: 98.5 F (36.9 C) 98 F (36.7 C)  SpO2: 98% 99%   Vitals:   06/06/20 0330 06/06/20 0345 06/06/20 0830 06/06/20 1200  BP:   128/82 (!) 131/92  Pulse: 80 81 82   Resp: 17 17 13 18   Temp:   98.5 F (36.9 C) 98 F (36.7 C)  TempSrc:   Oral Oral  SpO2: 97% 96% 98% 99%  Weight:       Height:       General: Alert awake, not in obvious distress, obese built HENT: pupils equally reacting to light,  No scleral pallor or icterus noted. Oral mucosa is moist.  Chest: Diminished breath sounds bilaterally  CVS: S1 &S2 heard. No murmur.  Regular rate and rhythm. Abdomen: Soft, mild tenderness over the epigastric region on deep palpation, nondistended.  Bowel sounds are heard.   Extremities: No cyanosis, clubbing or edema.  Peripheral pulses are palpable. Psych: Alert, awake and oriented, normal mood CNS:  No cranial nerve deficits.  Power equal in all extremities.   Skin: Warm and dry.  No rashes noted.  The results of significant diagnostics from this hospitalization (including imaging, microbiology, ancillary and laboratory) are listed below for reference.     Diagnostic Studies:   DG Chest Portable 1 View  Result Date: 05/31/2020 CLINICAL DATA:  Shortness of breath and weakness.  COVID positive. EXAM: PORTABLE CHEST 1 VIEW COMPARISON:  None. FINDINGS: Mild ill-defined multifocal infiltrates are seen involving the mid and lower lung fields, right greater than left. There is no evidence of a pleural effusion or pneumothorax. The heart size and mediastinal contours are within normal limits. Radiopaque vascular stents are seen overlying  the superior mediastinum and left axilla. The visualized skeletal structures are unremarkable. IMPRESSION: Mild multifocal bilateral infiltrates, right greater than left. Electronically Signed   By: Virgina Norfolk M.D.   On: 05/31/2020 21:52     Labs:   Basic Metabolic Panel: Recent Labs  Lab 06/02/20 0838 06/02/20 0838 06/03/20 0725 06/03/20 0725 06/04/20 0531 06/04/20 0531 06/05/20 0457 06/06/20 1025  NA 133*  --  136  --  137  --  138 138  K 4.0   < > 4.0   < > 3.8   < > 4.0 4.0  CL 101  --  107  --  110  --  110 108  CO2 17*  --  17*  --  14*  --  16* 17*  GLUCOSE 87  --  91  --  75  --  86 95  BUN 43*  --  33*  --  24*  --   22* 25*  CREATININE 1.82*  --  1.34*  --  1.26*  --  1.35* 1.39*  CALCIUM 9.0  --  7.9*  --  7.8*  --  8.0* 8.0*  MG 1.8  --  1.5*  --  1.4*  --  1.5* 1.5*   < > = values in this interval not displayed.   GFR Estimated Creatinine Clearance: 36 mL/min (A) (by C-G formula based on SCr of 1.39 mg/dL (H)). Liver Function Tests: Recent Labs  Lab 06/02/20 0838 06/03/20 0725 06/04/20 0531 06/05/20 0457 06/06/20 1025  AST 87* 63* 55* 48* 41  ALT 69* 51* 44 41 37  ALKPHOS 80 70 72 75 74  BILITOT 1.0 0.7 1.0 1.0 1.0  PROT 6.9 5.8* 5.8* 5.7* 6.0*  ALBUMIN 3.9 3.3* 3.3* 3.3* 3.3*   Recent Labs  Lab 06/04/20 0531  LIPASE 26   No results for input(s): AMMONIA in the last 168 hours. Coagulation profile Recent Labs  Lab 06/02/20 1830  INR 1.2    CBC: Recent Labs  Lab 06/02/20 0838 06/03/20 0725 06/04/20 0531 06/05/20 0457 06/06/20 1025  WBC 2.3* 2.0* 2.8* 2.9* 3.8*  NEUTROABS 1.1* 0.8* 1.3* 1.4* 2.1  HGB 10.6* 9.0* 9.8* 9.7* 9.3*  HCT 34.3* 29.3* 31.8* 30.9* 29.9*  MCV 60.1* 61.0* 60.9* 60.5* 59.8*  PLT 123* 119* 135* 133* 172   Cardiac Enzymes: No results for input(s): CKTOTAL, CKMB, CKMBINDEX, TROPONINI in the last 168 hours. BNP: Invalid input(s): POCBNP CBG: No results for input(s): GLUCAP in the last 168 hours. D-Dimer Recent Labs    06/05/20 0457 06/06/20 1025  DDIMER 0.59* 0.67*   Hgb A1c No results for input(s): HGBA1C in the last 72 hours. Lipid Profile No results for input(s): CHOL, HDL, LDLCALC, TRIG, CHOLHDL, LDLDIRECT in the last 72 hours. Thyroid function studies No results for input(s): TSH, T4TOTAL, T3FREE, THYROIDAB in the last 72 hours.  Invalid input(s): FREET3 Anemia work up Recent Labs    06/05/20 0457 06/06/20 1025  FERRITIN 5,885* 4,430*   Microbiology Recent Results (from the past 240 hour(s))  Resp Panel by RT-PCR (Flu A&B, Covid) Nasopharyngeal Swab     Status: Abnormal   Collection Time: 05/31/20  9:02 PM   Specimen:  Nasopharyngeal Swab; Nasopharyngeal(NP) swabs in vial transport medium  Result Value Ref Range Status   SARS Coronavirus 2 by RT PCR POSITIVE (A) NEGATIVE Final    Comment: RESULT CALLED TO, READ BACK BY AND VERIFIED WITH: RUSSELL A. @2330  ON 05/31/20 SKL (NOTE) SARS-CoV-2 target nucleic acids are DETECTED.  The SARS-CoV-2 RNA is generally detectable in upper respiratory specimens during the acute phase of infection. Positive results are indicative of the presence of the identified virus, but do not rule out bacterial infection or co-infection with other pathogens not detected by the test. Clinical correlation with patient history and other diagnostic information is necessary to determine patient infection status. The expected result is Negative.  Fact Sheet for Patients: EntrepreneurPulse.com.au  Fact Sheet for Healthcare Providers: IncredibleEmployment.be  This test is not yet approved or cleared by the [place] FDA and  has been authorized for detection and/or diagnosis of SARS-CoV-2 by FDA under an Emergency Use Authorization (EUA).  This EUA will remain in effect (meaning this test can be  used) for the duration of  the COVID-19 declaration under Section 564(b)(1) of the Act, 21 U.S.C. section 360bbb-3(b)(1), unless the authorization is terminated or revoked sooner.     Influenza A by PCR NEGATIVE NEGATIVE Final   Influenza B by PCR NEGATIVE NEGATIVE Final    Comment: (NOTE) The Xpert Xpress SARS-CoV-2/FLU/RSV plus assay is intended as an aid in the diagnosis of influenza from Nasopharyngeal swab specimens and should not be used as a sole basis for treatment. Nasal washings and aspirates are unacceptable for Xpert Xpress SARS-CoV-2/FLU/RSV testing.  Fact Sheet for Patients: EntrepreneurPulse.com.au  Fact Sheet for Healthcare Providers: IncredibleEmployment.be  This test is not yet approved or  cleared by the [place] FDA and has been authorized for detection and/or diagnosis of SARS-CoV-2 by FDA under an Emergency Use Authorization (EUA). This EUA will remain in effect (meaning this test can be used) for the duration of the COVID-19 declaration under Section 564(b)(1) of the Act, 21 U.S.C. section 360bbb-3(b)(1), unless the authorization is terminated or revoked.  Performed at [facility] [address] [place], [place] [address]   Blood Culture (routine x 2)     Status: None   Collection Time: 05/31/20  9:51 PM   Specimen: BLOOD  Result Value Ref Range Status   Specimen Description BLOOD BLOOD RIGHT HAND  Final   Special Requests   Final    BOTTLES DRAWN AEROBIC AND ANAEROBIC Blood Culture adequate volume   Culture   Final    NO GROWTH 5 DAYS Performed at [facility], [address] [place], [place] [address]    Report Status 06/05/2020 FINAL  Final  Blood Culture (routine x 2)     Status: None   Collection Time: 05/31/20  9:51 PM   Specimen: BLOOD  Result Value Ref Range Status   Specimen Description BLOOD RIGHT ANTECUBITAL  Final   Special Requests   Final    BOTTLES DRAWN AEROBIC AND ANAEROBIC Blood Culture adequate volume   Culture   Final    NO GROWTH 5 DAYS Performed at [facility], [address], [place], [place] [address]    Report Status 06/05/2020 FINAL  Final     Discharge Instructions:   Discharge Instructions    Call MD for:  persistant nausea and vomiting   Complete by: As directed    Call MD for:  severe uncontrolled pain   Complete by: As directed    Diet - low sodium heart healthy   Complete by: As directed    Soft diet, avoid coffee, soda, chocolate, citrus foods fatty fried foods.   Discharge instructions   Complete by: As directed    With your primary care physician in 1 week.  Check blood work and kidney function at  that time.  Follow-up with Dr. Bonna Gains GI.  Office to contact you.  Please  follow-up with your renal transplant team in 2 to 3 weeks.  Take medications as prescribed.   Increase activity slowly   Complete by: As directed      Allergies as of 06/06/2020      Reactions   Hydroxychloroquine Other (See Comments)   Toxic maculopathy Toxic maculopathy   Shellfish Allergy Shortness Of Breath, Swelling   Angioedema, throat swells closed   Bleomycin    Penicillins Nausea And Vomiting   Has patient had a PCN reaction causing immediate rash, facial/tongue/throat swelling, SOB or lightheadedness with hypotension: no Has patient had a PCN reaction causing severe rash involving mucus membranes or skin necrosis:no Has patient had a PCN reaction that required hospitalization no Has patient had a PCN reaction occurring within the last 10 years: no If all of the above answers are "NO", then may proceed with Cephalosporin use.   Aspirin Rash   Vancomycin Rash      Medication List    STOP taking these medications   chlorthalidone 25 MG tablet Commonly known as: HYGROTON     TAKE these medications   albuterol 108 (90 Base) MCG/ACT inhaler Commonly known as: VENTOLIN HFA Inhale 2 puffs into the lungs every 6 (six) hours.   cinacalcet 60 MG tablet Commonly known as: SENSIPAR Take 60 mg by mouth daily.   dicyclomine 10 MG capsule Commonly known as: BENTYL Take 1 capsule (10 mg total) by mouth 4 (four) times daily -  before meals and at bedtime.   guaiFENesin-dextromethorphan 100-10 MG/5ML syrup Commonly known as: ROBITUSSIN DM Take 10 mLs by mouth every 4 (four) hours as needed for cough.   HYDROcodone-acetaminophen 5-325 MG tablet Commonly known as: NORCO/VICODIN Take 1 tablet by mouth every 6 (six) hours as needed for up to 15 doses for moderate pain.   magnesium oxide 400 MG tablet Commonly known as: MAG-OX Take 1 tablet (400 mg total) by mouth daily. What changed: when to take this   mycophenolate 180 MG EC tablet Commonly known as: MYFORTIC Take 540  mg by mouth 2 (two) times daily.   ondansetron 4 MG tablet Commonly known as: ZOFRAN Take 1 tablet (4 mg total) by mouth every 6 (six) hours as needed for nausea.   pantoprazole 40 MG tablet Commonly known as: Protonix Take 1 tablet (40 mg total) by mouth 2 (two) times daily before a meal.   sucralfate 1 g tablet Commonly known as: Carafate Take 1 tablet (1 g total) by mouth 4 (four) times daily.   tacrolimus 0.5 MG capsule Commonly known as: PROGRAF Take 1.5-2 mg by mouth 2 (two) times daily. Take 2 mg (4 tablets) in the morning and 1.5 mg (3 tablets) in the evening.         Time coordinating discharge: 39 minutes  Signed:  Mckenzi Buonomo  [facility] 06/06/2020, 3:31 PM

## 2020-06-06 NOTE — Discharge Instructions (Signed)
COVID-19 COVID-19 is a respiratory infection that is caused by a virus called severe acute respiratory syndrome coronavirus 2 (SARS-CoV-2). The disease is also known as coronavirus disease or novel coronavirus. In some people, the virus may not cause any symptoms. In others, it may cause a serious infection. The infection can get worse quickly and can lead to complications, such as:  Pneumonia, or infection of the lungs.  Acute respiratory distress syndrome or ARDS. This is a condition in which fluid build-up in the lungs prevents the lungs from filling with air and passing oxygen into the blood.  Acute respiratory failure. This is a condition in which there is not enough oxygen passing from the lungs to the body or when carbon dioxide is not passing from the lungs out of the body.  Sepsis or septic shock. This is a serious bodily reaction to an infection.  Blood clotting problems.  Secondary infections due to bacteria or fungus.  Organ failure. This is when your body's organs stop working. The virus that causes COVID-19 is contagious. This means that it can spread from person to person through droplets from coughs and sneezes (respiratory secretions). What are the causes? This illness is caused by a virus. You may catch the virus by:  Breathing in droplets from an infected person. Droplets can be spread by a person breathing, speaking, singing, coughing, or sneezing.  Touching something, like a table or a doorknob, that was exposed to the virus (contaminated) and then touching your mouth, nose, or eyes. What increases the risk? Risk for infection You are more likely to be infected with this virus if you:  Are within 6 feet (2 meters) of a person with COVID-19.  Provide care for or live with a person who is infected with COVID-19.  Spend time in crowded indoor spaces or live in shared housing. Risk for serious illness You are more likely to become seriously ill from the virus if  you:  Are 50 years of age or older. The higher your age, the more you are at risk for serious illness.  Live in a nursing home or long-term care facility.  Have cancer.  Have a long-term (chronic) disease such as: ? Chronic lung disease, including chronic obstructive pulmonary disease or asthma. ? A long-term disease that lowers your body's ability to fight infection (immunocompromised). ? Heart disease, including heart failure, a condition in which the arteries that lead to the heart become narrow or blocked (coronary artery disease), a disease which makes the heart muscle thick, weak, or stiff (cardiomyopathy). ? Diabetes. ? Chronic kidney disease. ? Sickle cell disease, a condition in which red blood cells have an abnormal "sickle" shape. ? Liver disease.  Are obese. What are the signs or symptoms? Symptoms of this condition can range from mild to severe. Symptoms may appear any time from 2 to 14 days after being exposed to the virus. They include:  A fever or chills.  A cough.  Difficulty breathing.  Headaches, body aches, or muscle aches.  Runny or stuffy (congested) nose.  A sore throat.  New loss of taste or smell. Some people may also have stomach problems, such as nausea, vomiting, or diarrhea. Other people may not have any symptoms of COVID-19. How is this diagnosed? This condition may be diagnosed based on:  Your signs and symptoms, especially if: ? You live in an area with a COVID-19 outbreak. ? You recently traveled to or from an area where the virus is common. ? You   provide care for or live with a person who was diagnosed with COVID-19. ? You were exposed to a person who was diagnosed with COVID-19.  A physical exam.  Lab tests, which may include: ? Taking a sample of fluid from the back of your nose and throat (nasopharyngeal fluid), your nose, or your throat using a swab. ? A sample of mucus from your lungs (sputum). ? Blood tests.  Imaging tests,  which may include, X-rays, CT scan, or ultrasound. How is this treated? At present, there is no medicine to treat COVID-19. Medicines that treat other diseases are being used on a trial basis to see if they are effective against COVID-19. Your health care provider will talk with you about ways to treat your symptoms. For most people, the infection is mild and can be managed at home with rest, fluids, and over-the-counter medicines. Treatment for a serious infection usually takes places in a hospital intensive care unit (ICU). It may include one or more of the following treatments. These treatments are given until your symptoms improve.  Receiving fluids and medicines through an IV.  Supplemental oxygen. Extra oxygen is given through a tube in the nose, a face mask, or a hood.  Positioning you to lie on your stomach (prone position). This makes it easier for oxygen to get into the lungs.  Continuous positive airway pressure (CPAP) or bi-level positive airway pressure (BPAP) machine. This treatment uses mild air pressure to keep the airways open. A tube that is connected to a motor delivers oxygen to the body.  Ventilator. This treatment moves air into and out of the lungs by using a tube that is placed in your windpipe.  Tracheostomy. This is a procedure to create a hole in the neck so that a breathing tube can be inserted.  Extracorporeal membrane oxygenation (ECMO). This procedure gives the lungs a chance to recover by taking over the functions of the heart and lungs. It supplies oxygen to the body and removes carbon dioxide. Follow these instructions at home: Lifestyle  If you are sick, stay home except to get medical care. Your health care provider will tell you how long to stay home. Call your health care provider before you go for medical care.  Rest at home as told by your health care provider.  Do not use any products that contain nicotine or tobacco, such as cigarettes,  e-cigarettes, and chewing tobacco. If you need help quitting, ask your health care provider.  Return to your normal activities as told by your health care provider. Ask your health care provider what activities are safe for you. General instructions  Take over-the-counter and prescription medicines only as told by your health care provider.  Drink enough fluid to keep your urine pale yellow.  Keep all follow-up visits as told by your health care provider. This is important. How is this prevented?  There is no vaccine to help prevent COVID-19 infection. However, there are steps you can take to protect yourself and others from this virus. To protect yourself:   Do not travel to areas where COVID-19 is a risk. The areas where COVID-19 is reported change often. To identify high-risk areas and travel restrictions, check the CDC travel website: wwwnc.cdc.gov/travel/notices  If you live in, or must travel to, an area where COVID-19 is a risk, take precautions to avoid infection. ? Stay away from people who are sick. ? Wash your hands often with soap and water for 20 seconds. If soap and water   are not available, use an alcohol-based hand sanitizer. ? Avoid touching your mouth, face, eyes, or nose. ? Avoid going out in public, follow guidance from your state and local health authorities. ? If you must go out in public, wear a cloth face covering or face mask. Make sure your mask covers your nose and mouth. ? Avoid crowded indoor spaces. Stay at least 6 feet (2 meters) away from others. ? Disinfect objects and surfaces that are frequently touched every day. This may include:  Counters and tables.  Doorknobs and light switches.  Sinks and faucets.  Electronics, such as phones, remote controls, keyboards, computers, and tablets. To protect others: If you have symptoms of COVID-19, take steps to prevent the virus from spreading to others.  If you think you have a COVID-19 infection, contact  your health care provider right away. Tell your health care team that you think you may have a COVID-19 infection.  Stay home. Leave your house only to seek medical care. Do not use public transport.  Do not travel while you are sick.  Wash your hands often with soap and water for 20 seconds. If soap and water are not available, use alcohol-based hand sanitizer.  Stay away from other members of your household. Let healthy household members care for children and pets, if possible. If you have to care for children or pets, wash your hands often and wear a mask. If possible, stay in your own room, separate from others. Use a different bathroom.  Make sure that all people in your household wash their hands well and often.  Cough or sneeze into a tissue or your sleeve or elbow. Do not cough or sneeze into your hand or into the air.  Wear a cloth face covering or face mask. Make sure your mask covers your nose and mouth. Where to find more information  Centers for Disease Control and Prevention: www.cdc.gov/coronavirus/2019-ncov/index.html  World Health Organization: www.who.int/health-topics/coronavirus Contact a health care provider if:  You live in or have traveled to an area where COVID-19 is a risk and you have symptoms of the infection.  You have had contact with someone who has COVID-19 and you have symptoms of the infection. Get help right away if:  You have trouble breathing.  You have pain or pressure in your chest.  You have confusion.  You have bluish lips and fingernails.  You have difficulty waking from sleep.  You have symptoms that get worse. These symptoms may represent a serious problem that is an emergency. Do not wait to see if the symptoms will go away. Get medical help right away. Call your local emergency services (911 in the [place] Do not drive yourself to the hospital. Let the emergency medical personnel know if you think you have  COVID-19. Summary  COVID-19 is a respiratory infection that is caused by a virus. It is also known as coronavirus disease or novel coronavirus. It can cause serious infections, such as pneumonia, acute respiratory distress syndrome, acute respiratory failure, or sepsis.  The virus that causes COVID-19 is contagious. This means that it can spread from person to person through droplets from breathing, speaking, singing, coughing, or sneezing.  You are more likely to develop a serious illness if you are 50 years of age or older, have a weak immune system, live in a nursing home, or have chronic disease.  There is no medicine to treat COVID-19. Your health care provider will talk with you about ways to treat your symptoms.    Take steps to protect yourself and others from infection. Wash your hands often and disinfect objects and surfaces that are frequently touched every day. Stay away from people who are sick and wear a mask if you are sick. This information is not intended to replace advice given to you by your health care provider. Make sure you discuss any questions you have with your health care provider. Document Revised: 04/23/2019 Document Reviewed: 07/30/2018 Elsevier Patient Education  Lead: Quarantine vs. Isolation QUARANTINE keeps someone who was in close contact with someone who has COVID-19 away from others. If you had close contact with a person who has COVID-19  Stay home until 14 days after your last contact.  Check your temperature twice a day and watch for symptoms of COVID-19.  If possible, stay away from people who are at higher-risk for getting very sick from COVID-19. ISOLATION keeps someone who is sick or tested positive for COVID-19 without symptoms away from others, even in their own home. If you are sick and think or know you have COVID-19  Stay home until after ? At least 10 days since symptoms first appeared and ? At least 24 hours with  no fever without fever-reducing medication and ? Symptoms have improved If you tested positive for COVID-19 but do not have symptoms  Stay home until after ? 10 days have passed since your positive test If you live with others, stay in a specific "sick room" or area and away from other people or animals, including pets. Use a separate bathroom, if available. [email or web address] 01/25/2019 This information is not intended to replace advice given to you by your health care provider. Make sure you discuss any questions you have with your health care provider. Document Revised: 06/10/2019 Document Reviewed: 06/10/2019 Elsevier Patient Education  [place].

## 2020-06-06 NOTE — Progress Notes (Signed)
Refused all am labs.

## 2020-06-06 NOTE — Progress Notes (Signed)
Physical Therapy Treatment Patient Details Name: Robin Sanchez MRN: [id number] DOB: 1965-10-11 Today's Date: 06/06/2020    History of Present Illness Pt is a 54 y/o F admitted on 05/31/20 with c/o several days of HA, fever & body aches, vomiting & diarrhea with hx of recent covid exposure. Pt admitted for tx of PNA 2/2 Covid 19 & gastroenteritis symptoms. 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 Patient has progressed ambulation distance in the room to 64ft without assistive device with activity tolerance limited by fatigue. Heart rate 89bpm and Sp02 98% on room air after walking. Patient educated on energy conservation techniques, walking for conditioning, and LE strengthening exercises. Recommend to continue PT to maximize independence and address functional limitations remaining.     Follow Up Recommendations  Home health PT;Supervision/Assistance - 24 hour     Equipment Recommendations  Other (comment)    Recommendations for Other Services       Precautions / Restrictions Precautions Precautions: Fall Precaution Comments: COVID isolation  Restrictions Weight Bearing Restrictions: No    Mobility  Bed Mobility Overal bed mobility: Modified Independent             General bed mobility comments: supine to and from short sitting   Transfers Overall transfer level: Needs assistance Equipment used: None Transfers: Sit to/from Stand Sit to Stand: Supervision         General transfer comment: verbal cues for safety. multiple standing bouts performed   Ambulation/Gait Ambulation/Gait assistance: Supervision Gait Distance (Feet): 40 Feet Assistive device: None Gait Pattern/deviations: Step-through pattern Gait velocity: decreased    General Gait Details: Patient  ambulated several laps in the room for a total of ~ 40 ft. Slow but steady gait without assistive device. Further gait distance is limited by fatigue. Cues for energy conservation. Heart rate 89bpm and Sp92 98% on room air. Respiration rate 18   Stairs             Wheelchair Mobility    Modified Rankin (Stroke Patients Only)       Balance Overall balance assessment: Needs assistance         Standing balance support: No upper extremity supported Standing balance-Leahy Scale: Good Standing balance comment: during functional ambulation, reaching outside base of support with unilateral UE, no loss of balance                             Cognition Arousal/Alertness: Awake/alert Behavior During Therapy: WFL for tasks assessed/performed Overall Cognitive Status: Within Functional Limits for tasks assessed                                        Exercises General Exercises - Lower Extremity Long Arc Quad: AROM;Strengthening;Both;10 reps;Seated Hip Flexion/Marching: AROM;Strengthening;Both;10 reps;Seated Other Exercises Other Exercises: verbal and visual cues for exercise technique     General Comments        Pertinent Vitals/Pain Pain Assessment: Faces Faces Pain Scale: Hurts a little bit Pain Location: abdomen  Pain Descriptors / Indicators: Aching Pain Intervention(s): Limited activity within patient's tolerance    Home Living  Prior Function            PT Goals (current goals can now be found in the care plan section) Acute Rehab PT Goals Patient Stated Goal: to be able to walk to the bathroom  PT Goal Formulation: With patient Time For Goal Achievement: 06/18/20 Potential to Achieve Goals: Good Progress towards PT goals: Progressing toward goals    Frequency    Min 2X/week      PT Plan Current plan remains appropriate    Co-evaluation              AM-PAC PT "6 Clicks" Mobility    Outcome Measure  Help needed turning from your back to your side while in a flat bed without using bedrails?: None Help needed moving from lying on your back to sitting on the side of a flat bed without using bedrails?: None Help needed moving to and from a bed to a chair (including a wheelchair)?: A Little Help needed standing up from a chair using your arms (e.g., wheelchair or bedside chair)?: A Little Help needed to walk in hospital room?: A Little Help needed climbing 3-5 steps with a railing? : A Little 6 Click Score: 20    End of Session   Activity Tolerance: Patient tolerated treatment well Patient left: in bed;with call bell/phone within reach;with bed alarm set Nurse Communication: Mobility status PT Visit Diagnosis: Unsteadiness on feet (R26.81);Difficulty in walking, not elsewhere classified (R26.2);Muscle weakness (generalized) (M62.81)     Time: 9244-6286 PT Time Calculation (min) (ACUTE ONLY): 29 min  Charges:  $Therapeutic Exercise: 23-37 mins                     Minna Merritts, PT, MPT    Percell Locus 06/06/2020, 12:40 PM

## 2020-06-14 MED FILL — MYCOPHENOLATE SODIUM 180 MG TABLET,DELAYED RELEASE: ORAL | 30 days supply | Qty: 180 | Fill #2

## 2020-06-14 MED FILL — MYCOPHENOLATE SODIUM 180 MG TABLET,DELAYED RELEASE: 30 days supply | Qty: 180 | Fill #2 | Status: AC

## 2020-06-14 MED FILL — TACROLIMUS 0.5 MG CAPSULE, IMMEDIATE-RELEASE: 30 days supply | Qty: 210 | Fill #2

## 2020-06-14 MED FILL — CHLORTHALIDONE 25 MG TABLET: 30 days supply | Qty: 15 | Fill #6 | Status: AC

## 2020-06-14 MED FILL — MAGNESIUM OXIDE 400 MG (241.3 MG MAGNESIUM) TABLET: 30 days supply | Qty: 30 | Fill #1 | Status: AC

## 2020-06-14 MED FILL — TACROLIMUS 0.5 MG CAPSULE, IMMEDIATE-RELEASE: 30 days supply | Qty: 210 | Fill #2 | Status: AC

## 2020-06-14 MED FILL — CHLORTHALIDONE 25 MG TABLET: ORAL | 30 days supply | Qty: 15 | Fill #6

## 2020-06-14 MED FILL — MAGNESIUM OXIDE 400 MG (241.3 MG MAGNESIUM) TABLET: ORAL | 30 days supply | Qty: 30 | Fill #1

## 2020-06-14 NOTE — Unmapped (Signed)
12.8: disregard below note, spoke with patient today-ef    The [facility] has made a third and final attempt to reach this patient to refill the following medication:tacrolimus, mycophenolate.      We have left voicemails on the following phone numbers: [phone] .    Dates contacted: 11/24,12/3,12/8  Last scheduled delivery: 11/3    The patient may be at risk of non-compliance with this medication. The patient should call the [facility] at [phone] (option 4) to refill medication.    Thad Ranger   [facility] Specialty Pharmacist

## 2020-06-14 NOTE — Unmapped (Signed)
12/8: speaking with patient re: inability to reach after 3 attempts -ef    [facility] Clinical Assessment & Refill Coordination Note    Lynn Erickson, DOB: Apr 04, 1966  Phone: [phone] (home)     All above HIPAA information was verified with patient.     Was a Nurse, learning disability used for this call? No    Specialty Medication(s):   Transplant:  mycophenolic acid 180mg , tacrolimus 0.5mg  and cinacalcet 30mg      Current Outpatient Medications   Medication Sig Dispense Refill   ??? calcium carbonate (TUMS) 200 mg calcium (500 mg) chewable tablet Chew 1 tablet Three (3) times a day before meals.     ??? chlorthalidone (HYGROTON) 25 MG tablet Take one-half tablet (12.5 mg total) by mouth every morning. 15 tablet 11   ??? cinacalcet (SENSIPAR) 30 MG tablet Take 1 tablet (30 mg total) by mouth daily. 90 tablet 3   ??? famotidine (PEPCID) 20 MG tablet Take 1 tablet (20 mg total) by mouth Two (2) times a day. (Patient taking differently: Take 20 mg by mouth two (2) times a day as needed. ) 60 tablet 11   ??? magnesium oxide (MAG-OX) 400 mg (241.3 mg elemental magnesium) tablet Take 1 tablet by mouth once daily in the morning 30 tablet 11   ??? mycophenolate (MYFORTIC) 180 MG EC tablet Take 3 tablets (540 mg total) by mouth two (2) times a day. 180 tablet 11   ??? tacrolimus (PROGRAF) 0.5 MG capsule Take 4 capsules (2 mg) by mouth in the morning and 3 capsules (1.5 mg) in the evening. 210 capsule 11     No current facility-administered medications for this visit.        Changes to medications: Jozee reports no changes at this time.    Allergies   Allergen Reactions   ??? Plaquenil [Hydroxychloroquine] Other (See Comments)     Toxic maculopathy   ??? Shellfish Containing Products Anaphylaxis   ??? Bleomycin    ??? Aspirin Rash   ??? Penicillins Rash     skin testing 12/13/2016 negative, oral challenging pending for 03/15/2017   ??? 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       Changes to allergies: No    SPECIALTY MEDICATION ADHERENCE     Mycophenolate 180mg   : 2 days of medicine on hand   Tacrolimus 0.5mg   : 2 days of medicine on hand   cinacalcet 30mg   : 11 days of medicine on hand       Medication Adherence    Patient reported X missed doses in the last month: 0  Specialty Medication: tacrolimus 0.5mg   Patient is on additional specialty medications: Yes  Additional Specialty Medications: Mycophenolate 180mg   Patient Reported Additional Medication X Missed Doses in the Last Month: 0  Patient is on more than two specialty medications: Yes  Specialty Medication: cinacalcet 30mg   Patient Reported Additional Medication X Missed Doses in the Last Month: 0  Adherence tools used: patient uses a pill box to manage medications          Specialty medication(s) dose(s) confirmed: Regimen is correct and unchanged.     Are there any concerns with adherence? No but reminded patient to return our calls and call us when 7 days or less on hand of any medication    Adherence counseling provided? Not needed but see above    CLINICAL MANAGEMENT AND INTERVENTION      Clinical Benefit  Assessment:    Do you feel the medicine is effective or helping your condition? Yes    Clinical Benefit counseling provided? Not needed    Adverse Effects Assessment:    Are you experiencing any side effects? No    Are you experiencing difficulty administering your medicine? No    Quality of Life Assessment:    How many days over the past month did your transplant  keep you from your normal activities? For example, brushing your teeth or getting up in the morning. 0    Have you discussed this with your provider? Not needed    Therapy Appropriateness:    Is therapy appropriate? Yes, therapy is appropriate and should be continued    DISEASE/MEDICATION-SPECIFIC INFORMATION      N/A    PATIENT SPECIFIC NEEDS     - Does the patient have any physical, cognitive, or cultural barriers? No    - Is the patient high risk? Yes, patient is taking a REMS drug. Medication is dispensed in compliance with REMS program    - Does the patient require a Care Management Plan? No     - Does the patient require physician intervention or other additional services (i.e. nutrition, smoking cessation, social work)? No      SHIPPING     Specialty Medication(s) to be Shipped:   Transplant:  mycophenolic acid 180mg , tacrolimus 0.5mg  and cinacalcet 30mg     Other medication(s) to be shipped: chlorthalidone, magox     Changes to insurance: No    Delivery Scheduled: Yes, Expected medication delivery date: 06/15/2020 for chlorthalidone, magox, mycophenolate, tacrolimus, 06/21/2020 for cinacalcet.     Medication will be delivered via UPS to the confirmed prescription address in [facility].    The patient will receive a drug information handout for each medication shipped and additional FDA Medication Guides as required.  Verified that patient has previously received a Conservation officer, historic buildings.    All of the patient's questions and concerns have been addressed.    Thad Ranger   [facility] Specialty Pharmacist

## 2020-06-18 ENCOUNTER — Emergency Department
Admission: EM | Admit: 2020-06-18 | Discharge: 2020-06-18 | Disposition: A | Discharge status: Home / Self Care | Payer: Medicaid Other | Attending: Emergency Medicine | Admitting: Emergency Medicine

## 2020-06-18 ENCOUNTER — Other Ambulatory Visit: Payer: Self-pay

## 2020-06-18 ENCOUNTER — Encounter: Payer: Self-pay | Admitting: Emergency Medicine

## 2020-06-18 ENCOUNTER — Emergency Department: Payer: Medicaid Other

## 2020-06-18 DIAGNOSIS — Z8541 Personal history of malignant neoplasm of cervix uteri: Secondary | ICD-10-CM | POA: Diagnosis not present

## 2020-06-18 DIAGNOSIS — Z8616 Personal history of COVID-19: Secondary | ICD-10-CM | POA: Insufficient documentation

## 2020-06-18 DIAGNOSIS — R079 Chest pain, unspecified: Secondary | ICD-10-CM

## 2020-06-18 DIAGNOSIS — I10 Essential (primary) hypertension: Secondary | ICD-10-CM

## 2020-06-18 DIAGNOSIS — I120 Hypertensive chronic kidney disease with stage 5 chronic kidney disease or end stage renal disease: Secondary | ICD-10-CM | POA: Insufficient documentation

## 2020-06-18 DIAGNOSIS — R0789 Other chest pain: Secondary | ICD-10-CM | POA: Diagnosis present

## 2020-06-18 DIAGNOSIS — Z992 Dependence on renal dialysis: Secondary | ICD-10-CM | POA: Insufficient documentation

## 2020-06-18 DIAGNOSIS — N186 End stage renal disease: Secondary | ICD-10-CM | POA: Insufficient documentation

## 2020-06-18 DIAGNOSIS — R519 Headache, unspecified: Secondary | ICD-10-CM

## 2020-06-18 LAB — CBC
HCT: 28.8 % — ABNORMAL LOW (ref 36.0–46.0)
Hemoglobin: 8.9 g/dL — ABNORMAL LOW (ref 12.0–15.0)
MCH: 18.9 pg — ABNORMAL LOW (ref 26.0–34.0)
MCHC: 30.9 g/dL (ref 30.0–36.0)
MCV: 61.1 fL — ABNORMAL LOW (ref 80.0–100.0)
Platelets: 183 10*3/uL (ref 150–400)
RBC: 4.71 MIL/uL (ref 3.87–5.11)
RDW: 16.5 % — ABNORMAL HIGH (ref 11.5–15.5)
WBC: 3.9 10*3/uL — ABNORMAL LOW (ref 4.0–10.5)
nRBC: 0 % (ref 0.0–0.2)

## 2020-06-18 LAB — BASIC METABOLIC PANEL
Anion gap: 9 (ref 5–15)
BUN: 19 mg/dL (ref 6–20)
CO2: 21 mmol/L — ABNORMAL LOW (ref 22–32)
Calcium: 9.6 mg/dL (ref 8.9–10.3)
Chloride: 105 mmol/L (ref 98–111)
Creatinine, Ser: 1.01 mg/dL — ABNORMAL HIGH (ref 0.44–1.00)
GFR, Estimated: >60 mL/min (ref >60)
Glucose, Bld: 117 mg/dL — ABNORMAL HIGH (ref 70–99)
Potassium: 5.3 mmol/L — ABNORMAL HIGH (ref 3.5–5.1)
Sodium: 135 mmol/L (ref 135–145)

## 2020-06-18 LAB — TROPONIN I (HIGH SENSITIVITY): Troponin I (High Sensitivity): 6 ng/L (ref <18)

## 2020-06-18 NOTE — Progress Notes (Signed)
  Chaplain On-Call provided spiritual and emotional support for the patient and her daughter while they were in the ICU waiting room.  They were receiving updates about the condition of [name], who is a patient in [place].  Patient experienced chest pain and dizziness there, and was transported to the ED by two ICU Nurses for assessment.  Chaplain remains available for further support as needed.  [place] Aymar Whitfill M.Div., [facility]

## 2020-06-18 NOTE — ED Provider Notes (Signed)
[facility] Emergency Department Provider Note   ____________________________________________   Event Date/Time   First MD Initiated Contact with Patient 06/18/20 [phone]     (approximate)  I have reviewed the triage vital signs and the nursing notes.   HISTORY  Chief Complaint Headache and Chest Pain    HPI Robin Sanchez is a 54 y.o. female with a stated past medical history of anemia, hypertension, lupus, and CKD on dialysis Tuesday Thursday Saturday who presents for chest tightness, headache, high blood pressure, and nausea that began yesterday evening after she was told that her husband was going to be admitted to the ICU.  Patient states that she took her blood pressure at home and found it to be 158/148 and took her normal blood pressure medicines prior to presentation to the emergency department.  Patient states that she still has a mild headache after taking this blood pressure medicine as well as the Tylenol to her chest pain has resolved.  Patient states that she has had similar symptoms in the past when her blood pressure has become elevated.         Past Medical History:  Diagnosis Date   Anemia    Cancer ([place])    cervical   Chronic kidney disease    dialysis t,t,s   Dysrhythmia    GERD (gastroesophageal reflux disease)    Hypertension    Lupus (HCC)    currently in remission   Osteoporosis    Peripheral vascular disease (HCC)    Shortness of breath dyspnea    occas    Patient Active Problem List   Diagnosis Date Noted   Pneumonia due to COVID-19 virus 06/01/2020   Elevated LFTs 06/01/2020   Hyponatremia 06/01/2020   AKI (acute kidney injury) ([place]) 06/01/2020   Pancytopenia ([place]) 27/12/2374   Diastolic dysfunction 28/31/5176   Anemia due to chronic illness 06/18/2017   Arm pain, diffuse, left 11/01/2016   Kidney transplant status 10/13/2016   HTN (hypertension) 05/01/2016   ESRD (end stage renal disease)  ([place]) 05/01/2016   Renal dialysis device, implant, or graft complication 16/01/3709   Systemic lupus erythematosus, unspecified ([place]) 06/30/2015    Past Surgical History:  Procedure Laterality Date   ARTERIOVENOUS GRAFT PLACEMENT     currently dialysis access in right thigh   DILATION AND CURETTAGE OF UTERUS     PERIPHERAL VASCULAR CATHETERIZATION N/A 11/13/2015   Procedure: A/V Shuntogram/Fistulagram;  Surgeon: Algernon Huxley, MD;  Location: [place] [facility];  Service: Cardiovascular;  Laterality: N/A;   PERIPHERAL VASCULAR CATHETERIZATION  11/13/2015   Procedure: Dialysis/Perma Catheter Insertion;  Surgeon: Algernon Huxley, MD;  Location: [place] [facility];  Service: Cardiovascular;;   PERIPHERAL VASCULAR CATHETERIZATION N/A 01/18/2016   Procedure: Dialysis/Perma Catheter Removal;  Surgeon: Algernon Huxley, MD;  Location: [place] [facility];  Service: Cardiovascular;  Laterality: N/A;   REVISION OF ARTERIOVENOUS GORETEX GRAFT Left 11/23/2015   Procedure: REVISION OF ARTERIOVENOUS GORETEX GRAFT;  Surgeon: Algernon Huxley, MD;  Location: [facility];  Service: Vascular;  Laterality: Left;   STENTS     MULTIPLE DIALYSIS STENTS    Prior to Admission medications   Medication Sig Start Date End Date Taking? Authorizing Provider  albuterol (VENTOLIN HFA) 108 (90 Base) MCG/ACT inhaler Inhale 2 puffs into the lungs every 6 (six) hours. 06/06/20   Pokhrel, Corrie Mckusick, MD  cinacalcet (SENSIPAR) 60 MG tablet Take 60 mg by mouth daily.    [provider]  dicyclomine (  BENTYL) 10 MG capsule Take 1 capsule (10 mg total) by mouth 4 (four) times daily -  before meals and at bedtime. 06/06/20   Pokhrel, Corrie Mckusick, MD  guaiFENesin-dextromethorphan (ROBITUSSIN DM) 100-10 MG/5ML syrup Take 10 mLs by mouth every 4 (four) hours as needed for cough. 06/06/20   Pokhrel, Corrie Mckusick, MD  HYDROcodone-acetaminophen (NORCO/VICODIN) 5-325 MG tablet Take 1 tablet by mouth every 6 (six) hours as needed for up to 15 doses for  moderate pain. 06/06/20   Pokhrel, Corrie Mckusick, MD  magnesium oxide (MAG-OX) 400 MG tablet Take 1 tablet (400 mg total) by mouth daily. 06/06/20   Pokhrel, Corrie Mckusick, MD  mycophenolate (MYFORTIC) 180 MG EC tablet Take 540 mg by mouth 2 (two) times daily. 04/14/20 04/14/21  [provider]  ondansetron (ZOFRAN) 4 MG tablet Take 1 tablet (4 mg total) by mouth every 6 (six) hours as needed for nausea. 06/06/20   Pokhrel, Corrie Mckusick, MD  pantoprazole (PROTONIX) 40 MG tablet Take 1 tablet (40 mg total) by mouth 2 (two) times daily before a meal. 06/06/20 06/06/21  Pokhrel, Corrie Mckusick, MD  sucralfate (CARAFATE) 1 g tablet Take 1 tablet (1 g total) by mouth 4 (four) times daily. 06/06/20 07/06/20  Pokhrel, Corrie Mckusick, MD  tacrolimus (PROGRAF) 0.5 MG capsule Take 1.5-2 mg by mouth 2 (two) times daily. Take 2 mg (4 tablets) in the morning and 1.5 mg (3 tablets) in the evening. 05/06/17   [provider]    Allergies Hydroxychloroquine, Shellfish allergy, Bleomycin, Penicillins, Aspirin, and Vancomycin  No family history on file.  Social History Social History   Tobacco Use   Smoking status: Never Smoker   Smokeless tobacco: Never Used  Substance Use Topics   Alcohol use: No   Drug use: No    Review of Systems Constitutional: No fever/chills Eyes: No visual changes. ENT: No sore throat. Cardiovascular: Endorses chest pain. Respiratory: Denies shortness of breath. Gastrointestinal: No abdominal pain.  Endorses nausea, no vomiting.  No diarrhea. Genitourinary: Negative for dysuria. Musculoskeletal: Negative for acute arthralgias Skin: Negative for rash. Neurological: Positive for headaches, negative for weakness/numbness/paresthesias in any extremity Psychiatric: Negative for suicidal ideation/homicidal ideation   ____________________________________________   PHYSICAL EXAM:  VITAL SIGNS: ED Triage Vitals [06/18/20 0303]  Enc Vitals Group     BP (!) 149/92     Pulse Rate 98      Resp 18     Temp 99.1 F (37.3 C)     Temp Source Oral     SpO2 97 %     Weight 140 lb (63.5 kg)     Height 4\' 11"  (1.499 m)     Head Circumference      Peak Flow      Pain Score 10     Pain Loc      Pain Edu?      Excl. in [place]?    Constitutional: Alert and oriented. Well appearing and in no acute distress. Eyes: Conjunctivae are normal. PERRL. Head: Atraumatic. Nose: No congestion/rhinnorhea. Mouth/Throat: Mucous membranes are moist. Neck: No stridor Cardiovascular: Grossly normal heart sounds.  Good peripheral circulation. Respiratory: Normal respiratory effort.  No retractions. Gastrointestinal: Soft and nontender. No distention. Musculoskeletal: No obvious deformities Neurologic:  Normal speech and language. No gross focal neurologic deficits are appreciated. Skin:  Skin is warm and dry. No rash noted. Psychiatric: Mood and affect are normal. Speech and behavior are normal.  ____________________________________________   LABS (all labs ordered are listed, but only abnormal results are displayed)  Labs  Reviewed  BASIC METABOLIC PANEL - Abnormal; Notable for the following components:      Result Value   Potassium 5.3 (*)    CO2 21 (*)    Glucose, Bld 117 (*)    Creatinine, Ser 1.01 (*)    All other components within normal limits  CBC - Abnormal; Notable for the following components:   WBC 3.9 (*)    Hemoglobin 8.9 (*)    HCT 28.8 (*)    MCV 61.1 (*)    MCH 18.9 (*)    RDW 16.5 (*)    All other components within normal limits  TROPONIN I (HIGH SENSITIVITY)  TROPONIN I (HIGH SENSITIVITY)   ____________________________________________  EKG  ED ECG REPORT I, Naaman Plummer, the attending physician, personally viewed and interpreted this ECG.  Date: 06/18/2020 EKG Time: 0302 Rate: 98 Rhythm: normal sinus rhythm QRS Axis: normal Intervals: normal ST/T Wave abnormalities: normal Narrative Interpretation: no evidence of acute  ischemia  ____________________________________________  RADIOLOGY  ED MD interpretation: Two view x-ray of the chest shows patchy bilateral nodular airspace disease that is slightly improved since prior study and without evidence of pneumonia, pneumothorax, or widened mediastinum  Official radiology report(s): DG Chest 2 View  Result Date: 06/18/2020 CLINICAL DATA:  Chest pain EXAM: CHEST - 2 VIEW COMPARISON:  06/02/2020 FINDINGS: Bilateral patchy nodular airspace disease within both lungs, similar or slightly improved since prior study. Heart is normal size. No effusions. No acute bony abnormality. IMPRESSION: Patchy bilateral nodular airspace disease, similar or slightly improved since prior study. Electronically Signed   By: Rolm Baptise M.D.   On: 06/18/2020 03:36    ____________________________________________   PROCEDURES  Procedure(s) performed (including Critical Care):  .1-3 Lead EKG Interpretation Performed by: Naaman Plummer, MD Authorized by: Naaman Plummer, MD     Interpretation: normal     ECG rate:  89   ECG rate assessment: normal     Rhythm: sinus rhythm     Ectopy: none     Conduction: normal       ____________________________________________   INITIAL IMPRESSION / ASSESSMENT AND PLAN / ED COURSE  As part of my medical decision making, I reviewed the following data within the [place] notes reviewed and incorporated, Labs reviewed, EKG interpreted, Old chart reviewed, Radiograph reviewed and Notes from prior ED visits reviewed and incorporated      Presents to the emergency department complaining of high blood pressure. Patient is otherwise asymptomatic without confusion, chest pain, hematuria, or SOB. - Nonadherence to antihypertensive regimen DDx: CV, AMI, heart failure, renal infarction or failure or other end organ damage. EKG and laboratory evaluation as well as chest x-ray did not show any evidence of acute  abnormalities. Disposition: Discussed with patient their elevated blood pressure and need for close outpatient management of their hypertension. Will arrange for the patient to follow up in a primary care clinic       ____________________________________________   FINAL CLINICAL IMPRESSION(S) / ED DIAGNOSES  Final diagnoses:  Hypertension, unspecified type  Nonspecific chest pain  Acute nonintractable headache, unspecified headache type     ED Discharge Orders    None       Note:  This document was prepared using Dragon voice recognition software and may include unintentional dictation errors.   Naaman Plummer, MD 06/18/20 [phone]

## 2020-06-18 NOTE — ED Triage Notes (Signed)
Patient with complaint of chest pain, headache, high blood pressure and nausea that started yesterday evening. Patient states that this started when she was told that her husband was going to be admitted to ICU. Patient states that her blood pressure was 158/148 and that she took her blood pressure medication.

## 2020-06-20 MED FILL — CINACALCET 30 MG TABLET: ORAL | 90 days supply | Qty: 90 | Fill #3

## 2020-06-20 MED FILL — CINACALCET 30 MG TABLET: 90 days supply | Qty: 90 | Fill #3 | Status: AC

## 2020-07-06 ENCOUNTER — Telehealth: Payer: Medicaid Other | Admitting: Gastroenterology

## 2020-07-14 NOTE — Unmapped (Signed)
1/12: disregard below note, spoke with patient today -ef    The [facility] has made a second and final attempt to reach this patient to refill the following medication: tacrolimus, mycophenolate and maintenance meds .      We have left voicemails on the following phone numbers: [phone].    Dates contacted: 12/28 & 01/07  Last scheduled delivery: 06/15/2020    The patient may be at risk of non-compliance with this medication. The patient should call the [facility] at [phone] (option 4) to refill medication.    Oretha Milch   [facility] Specialty Technician

## 2020-07-19 DIAGNOSIS — Z940 Kidney transplant status: Principal | ICD-10-CM

## 2020-07-19 MED ORDER — CHLORTHALIDONE 25 MG TABLET
ORAL_TABLET | Freq: Every morning | ORAL | 11 refills | 30.00000 days | Status: CN
Start: 2020-07-19 — End: 2021-07-19
  Filled 2020-07-20: qty 45, 90d supply, fill #0

## 2020-07-19 NOTE — Unmapped (Signed)
[facility] Refill Coordination Note    Specialty Medication(s) to be Shipped:   Transplant:  mycophenolic acid 180mg  and tacrolimus 0.5mg     Other medication(s) to be shipped: chlorthalidone, magox     Lynn Erickson, DOB: 02/26/1966  Phone: [phone] (home)       All above HIPAA information was verified with patient.     Was a Nurse, learning disability used for this call? No    Completed refill call assessment today to schedule patient's medication shipment from the [facility] [phone]).       Specialty medication(s) and dose(s) confirmed: Regimen is correct and unchanged.   Changes to medications: [name] reports no changes at this time.  Changes to insurance: No  Questions for the pharmacist: No    Confirmed patient received Welcome Packet with first shipment. The patient will receive a drug information handout for each medication shipped and additional FDA Medication Guides as required.       DISEASE/MEDICATION-SPECIFIC INFORMATION        N/A    SPECIALTY MEDICATION ADHERENCE     Medication Adherence    Patient reported X missed doses in the last month: 0  Specialty Medication: mycophenolate 180mg   Patient is on additional specialty medications: Yes  Additional Specialty Medications: Tacrolimus 0.5mg   Patient Reported Additional Medication X Missed Doses in the Last Month: 0  Adherence tools used: patient uses a pill box to manage medications                Tacrolimus 0.5mg   : 4 days of medicine on hand   Mycophenolate 180mg   : 4 days of medicine on hand         SHIPPING     Shipping address confirmed in Epic.     Delivery Scheduled: Yes, Expected medication delivery date: 07/21/2020.  However, Rx request for refills was sent to the provider as there are none remaining.     Medication will be delivered via UPS to the prescription address in Epic WAM.    Thad Ranger   [facility] Specialty Pharmacist

## 2020-07-20 MED FILL — MAGNESIUM OXIDE 400 MG (241.3 MG MAGNESIUM) TABLET: ORAL | 30 days supply | Qty: 30 | Fill #2

## 2020-07-20 MED FILL — TACROLIMUS 0.5 MG CAPSULE, IMMEDIATE-RELEASE: 30 days supply | Qty: 210 | Fill #3

## 2020-07-20 MED FILL — MYCOPHENOLATE SODIUM 180 MG TABLET,DELAYED RELEASE: ORAL | 30 days supply | Qty: 180 | Fill #3

## 2020-08-10 NOTE — Unmapped (Signed)
[facility] Refill Coordination Note    Specialty Medication(s) to be Shipped:   Transplant:  mycophenolic acid 180mg  and tacrolimus 0.5mg     Other medication(s) to be shipped: Mag 400mg      Lynn Erickson, DOB: October 02, 1965  Phone: [phone] (home)       All above HIPAA information was verified with patient.     Was Lynn Erickson Nurse, learning disability used for this call? No    Completed refill call assessment today to schedule patient's medication shipment from the [facility] [phone]).       Specialty medication(s) and dose(s) confirmed: Regimen is correct and unchanged.   Changes to medications: Lynn Erickson reports no changes at this time.  Changes to insurance: No  Questions for the pharmacist: No    Confirmed patient received Welcome Packet with first shipment. The patient will receive Lynn Erickson drug information handout for each medication shipped and additional FDA Medication Guides as required.       DISEASE/MEDICATION-SPECIFIC INFORMATION        N/Lynn Erickson    SPECIALTY MEDICATION ADHERENCE     Medication Adherence    Patient reported X missed doses in the last month: 0  Adherence tools used: patient uses Lynn Erickson pill box to manage medications        mycophenolic acid 180mg  10 days worth of medication on hand.  tacrolimus 0.5mg  10 days worth of medication on hand.            SHIPPING     Shipping address confirmed in Epic.     Delivery Scheduled: Yes, Expected medication delivery date: 08/16/20.     Medication will be delivered via UPS to the prescription address in Epic WAM.    Lynn Erickson Lynn Erickson Lynn Erickson   [facility] Specialty Technician

## 2020-08-15 MED FILL — MYCOPHENOLATE SODIUM 180 MG TABLET,DELAYED RELEASE: ORAL | 30 days supply | Qty: 180 | Fill #4

## 2020-08-15 MED FILL — TACROLIMUS 0.5 MG CAPSULE, IMMEDIATE-RELEASE: 30 days supply | Qty: 210 | Fill #4

## 2020-08-15 MED FILL — MAGNESIUM OXIDE 400 MG (241.3 MG MAGNESIUM) TABLET: ORAL | 30 days supply | Qty: 30 | Fill #3

## 2020-09-04 NOTE — Unmapped (Signed)
Pt request for RX Refill    cinacalcet (SENSIPAR) 30 MG tablet

## 2020-09-05 MED ORDER — CINACALCET 30 MG TABLET
ORAL_TABLET | Freq: Every day | ORAL | 3 refills | 90 days | Status: CP
Start: 2020-09-05 — End: 2021-09-05
  Filled 2020-09-11: qty 90, 90d supply, fill #0

## 2020-09-07 NOTE — Unmapped (Signed)
[facility] Refill Coordination Note    Specialty Medication(s) to be Shipped:   Transplant:  mycophenolic acid 180mg  and tacrolimus 0.5mg     Other medication(s) to be shipped: Mag 400mg , Cinacalcet     Lynn Erickson, DOB: April 26, 1966  Phone: [phone] (home)       All above HIPAA information was verified with patient.     Was a Nurse, learning disability used for this call? No    Completed refill call assessment today to schedule patient's medication shipment from the [facility] [phone]).       Specialty medication(s) and dose(s) confirmed: Regimen is correct and unchanged.   Changes to medications: [name] reports no changes at this time.  Changes to insurance: No  Questions for the pharmacist: No    Confirmed patient received Welcome Packet with first shipment. The patient will receive a drug information handout for each medication shipped and additional FDA Medication Guides as required.       DISEASE/MEDICATION-SPECIFIC INFORMATION        N/A    SPECIALTY MEDICATION ADHERENCE     Medication Adherence    Patient reported X missed doses in the last month: 0  Specialty Medication: Mycophenolate 180mg   Patient is on additional specialty medications: Yes  Additional Specialty Medications: Tacrolimus 0.5mg   Patient Reported Additional Medication X Missed Doses in the Last Month: 0  Patient is on more than two specialty medications: Yes  Specialty Medication: Cinacalcet 30mg   Patient Reported Additional Medication X Missed Doses in the Last Month: 0  Any gaps in refill history greater than 2 weeks in the last 3 months: no  Demonstrates understanding of importance of adherence: yes  Informant: patient  Reliability of informant: reliable  Adherence tools used: patient uses a pill box to manage medications  Confirmed plan for next specialty medication refill: delivery by pharmacy  Refills needed for supportive medications: not needed        mycophenolic acid 180mg  10 days worth of medication on hand.  tacrolimus 0.5mg  10 days worth of medication on hand.            SHIPPING     Shipping address confirmed in Epic.     Delivery Scheduled: Yes, Expected medication delivery date: 09/12/2020.     Medication will be delivered via UPS to the prescription address in Epic WAM.    [name] D Dewanna Hurston   [facility] Specialty Technician

## 2020-09-11 MED FILL — MAGNESIUM OXIDE 400 MG (241.3 MG MAGNESIUM) TABLET: ORAL | 30 days supply | Qty: 30 | Fill #4

## 2020-09-11 MED FILL — MYCOPHENOLATE SODIUM 180 MG TABLET,DELAYED RELEASE: ORAL | 30 days supply | Qty: 180 | Fill #5

## 2020-09-11 MED FILL — TACROLIMUS 0.5 MG CAPSULE, IMMEDIATE-RELEASE: 30 days supply | Qty: 210 | Fill #5

## 2020-09-21 NOTE — Unmapped (Signed)
Left message for patient to return my call to r/s annual

## 2020-10-11 NOTE — Unmapped (Signed)
[facility] Refill Coordination Note    Specialty Medication(s) to be Shipped:   Transplant: tacrolimus 0.5mg  and General Specialty: mycophenolate 180mg     Other medication(s) to be shipped: No additional medications requested for fill at this time     Lynn Erickson, DOB: 1965-10-21  Phone: [phone] (home)       All above HIPAA information was verified with patient.     Was a Nurse, learning disability used for this call? No    Completed refill call assessment today to schedule patient's medication shipment from the [facility] [phone]).       Specialty medication(s) and dose(s) confirmed: Regimen is correct and unchanged.   Changes to medications: Lynn Erickson reports no changes at this time.  Changes to insurance: No  Questions for the pharmacist: No    Confirmed patient received a Conservation officer, historic buildings and a Surveyor, mining with first shipment. The patient will receive a drug information handout for each medication shipped and additional FDA Medication Guides as required.       DISEASE/MEDICATION-SPECIFIC INFORMATION        N/A    SPECIALTY MEDICATION ADHERENCE     Medication Adherence    Patient reported X missed doses in the last month: 0  Specialty Medication: Mycophenolate 180mg   Patient is on additional specialty medications: Yes  Additional Specialty Medications: Tacrolimus 0.5mg   Patient Reported Additional Medication X Missed Doses in the Last Month: 0  Patient is on more than two specialty medications: No  Adherence tools used: patient uses a pill box to manage medications                Mycophenolate 180 mg: 10 days of medicine on hand   Tacrolimus 0.5 mg: 3 days of medicine on hand         SHIPPING     Shipping address confirmed in Epic.     Delivery Scheduled: Yes, Expected medication delivery date: 10/13/20.     Medication will be delivered via UPS to the prescription address in Epic WAM.    Nancy Nordmann [facility] Specialty Technician

## 2020-10-12 MED FILL — TACROLIMUS 0.5 MG CAPSULE, IMMEDIATE-RELEASE: 30 days supply | Qty: 210 | Fill #6

## 2020-10-12 MED FILL — MAGNESIUM OXIDE 400 MG (241.3 MG MAGNESIUM) TABLET: ORAL | 30 days supply | Qty: 30 | Fill #5

## 2020-10-12 MED FILL — CHLORTHALIDONE 25 MG TABLET: ORAL | 90 days supply | Qty: 45 | Fill #1

## 2020-10-12 MED FILL — MYCOPHENOLATE SODIUM 180 MG TABLET,DELAYED RELEASE: ORAL | 30 days supply | Qty: 180 | Fill #6

## 2020-11-10 MED FILL — MAGNESIUM OXIDE 400 MG (241.3 MG MAGNESIUM) TABLET: ORAL | 30 days supply | Qty: 30 | Fill #6

## 2020-11-10 NOTE — Unmapped (Signed)
[facility] Clinical Assessment & Refill Coordination Note    Lynn Erickson, DOB: Jan 28, 1966  Phone: [phone] (home)     All above HIPAA information was verified with patient.     Was a Nurse, learning disability used for this call? No    Specialty Medication(s):   Transplant:  mycophenolic acid 180mg  and tacrolimus 0.5mg      Current Outpatient Medications   Medication Sig Dispense Refill   ??? calcium carbonate (TUMS) 200 mg calcium (500 mg) chewable tablet Chew 1 tablet Three (3) times a day before meals.     ??? chlorthalidone (HYGROTON) 25 MG tablet Take one-half tablet (12.5 mg total) by mouth every morning. 45 tablet 3   ??? cinacalcet (SENSIPAR) 30 MG tablet Take 1 tablet (30 mg total) by mouth daily. 90 tablet 3   ??? famotidine (PEPCID) 20 MG tablet Take 1 tablet (20 mg total) by mouth Two (2) times a day. (Patient taking differently: Take 20 mg by mouth two (2) times a day as needed. ) 60 tablet 11   ??? magnesium oxide (MAG-OX) 400 mg (241.3 mg elemental magnesium) tablet Take 1 tablet by mouth once daily in the morning 30 tablet 11   ??? mycophenolate (MYFORTIC) 180 MG EC tablet Take 3 tablets (540 mg total) by mouth two (2) times a day. 180 tablet 11   ??? tacrolimus (PROGRAF) 0.5 MG capsule Take 4 capsules (2 mg) by mouth in the morning and 3 capsules (1.5 mg) in the evening. 210 capsule 11     No current facility-administered medications for this visit.        Changes to medications: Mazal reports no changes at this time.    Allergies   Allergen Reactions   ??? Plaquenil [Hydroxychloroquine] Other (See Comments)     Toxic maculopathy   ??? Shellfish Containing Products Anaphylaxis   ??? Bleomycin    ??? Aspirin Rash   ??? Penicillins Rash     skin testing 12/13/2016 negative, oral challenging pending for 03/15/2017   ??? 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       Changes to allergies: No    SPECIALTY MEDICATION ADHERENCE     Tacrolimus 0.5mg   : 10 days of medicine on hand Mycophenolate 180mg   : 10 days of medicine on hand     Medication Adherence    Patient reported X missed doses in the last month: 0  Specialty Medication: tacrolimus 0.5mg   Patient is on additional specialty medications: Yes  Additional Specialty Medications: Mycophenolate 180mg   Patient Reported Additional Medication X Missed Doses in the Last Month: 0  Adherence tools used: patient uses a pill box to manage medications          Specialty medication(s) dose(s) confirmed: Regimen is correct and unchanged.     Are there any concerns with adherence? No    Adherence counseling provided? Not needed    CLINICAL MANAGEMENT AND INTERVENTION      Clinical Benefit Assessment:    Do you feel the medicine is effective or helping your condition? Yes    Clinical Benefit counseling provided? Not needed    Adverse Effects Assessment:    Are you experiencing any side effects? No    Are you experiencing difficulty administering your medicine? No    Quality of Life Assessment:    How many days over the past month did your transplant  keep you from your normal activities? For example, brushing your teeth  or getting up in the morning. 0    Have you discussed this with your provider? Not needed    Acute Infection Status:    Acute infections noted within Epic:  No active infections  Patient reported infection: None    Therapy Appropriateness:    Is therapy appropriate? Yes, therapy is appropriate and should be continued    DISEASE/MEDICATION-SPECIFIC INFORMATION      N/A    PATIENT SPECIFIC NEEDS     - Does the patient have any physical, cognitive, or cultural barriers? No    - Is the patient high risk? Yes, patient is taking a REMS drug. Medication is dispensed in compliance with REMS program    - Does the patient require a Care Management Plan? No     - Does the patient require physician intervention or other additional services (i.e. nutrition, smoking cessation, social work)? No      SHIPPING     Specialty Medication(s) to be Shipped:   Transplant:  mycophenolic acid 180mg  and tacrolimus 0.5mg     Other medication(s) to be shipped: No additional medications requested for fill at this time     Changes to insurance: No    Delivery Scheduled: Yes, Expected medication delivery date: 11/14/2020.     Medication will be delivered via UPS to the confirmed prescription address in [facility].    The patient will receive a drug information handout for each medication shipped and additional FDA Medication Guides as required.  Verified that patient has previously received a Conservation officer, historic buildings and a Surveyor, mining.    All of the patient's questions and concerns have been addressed.    Thad Ranger   [facility] Specialty Pharmacist

## 2020-11-13 MED FILL — TACROLIMUS 0.5 MG CAPSULE, IMMEDIATE-RELEASE: 30 days supply | Qty: 210 | Fill #7

## 2020-11-13 MED FILL — MYCOPHENOLATE SODIUM 180 MG TABLET,DELAYED RELEASE: ORAL | 30 days supply | Qty: 180 | Fill #7

## 2020-12-15 MED FILL — CINACALCET 30 MG TABLET: ORAL | 90 days supply | Qty: 90 | Fill #1

## 2020-12-15 MED FILL — TACROLIMUS 0.5 MG CAPSULE, IMMEDIATE-RELEASE: 30 days supply | Qty: 210 | Fill #8

## 2020-12-15 MED FILL — MAGNESIUM OXIDE 400 MG (241.3 MG MAGNESIUM) TABLET: ORAL | 30 days supply | Qty: 30 | Fill #7

## 2020-12-15 MED FILL — MYCOPHENOLATE SODIUM 180 MG TABLET,DELAYED RELEASE: ORAL | 30 days supply | Qty: 180 | Fill #8

## 2020-12-15 NOTE — Unmapped (Signed)
[facility] Refill Coordination Note    Specialty Medication(s) to be Shipped:   Transplant: tacrolimus 0.5mg  and General Specialty: mycophenolate 180mg     Other medication(s) to be shipped: Magnesium Oxide 400mg , Cinacalcet 30mg      Lynn Erickson, DOB: [birth year]  Phone: [phone] (home)       All above HIPAA information was verified with patient.     Was a Nurse, learning disability used for this call? No    Completed refill call assessment today to schedule patient's medication shipment from the [facility] [phone]).  All relevant notes have been reviewed.     Specialty medication(s) and dose(s) confirmed: Regimen is correct and unchanged.   Changes to medications: [name] reports no changes at this time.  Changes to insurance: No  New side effects reported not previously addressed with a pharmacist or physician: None reported  Questions for the pharmacist: No    Confirmed patient received a Conservation officer, historic buildings and a Surveyor, mining with first shipment. The patient will receive a drug information handout for each medication shipped and additional FDA Medication Guides as required.       DISEASE/MEDICATION-SPECIFIC INFORMATION        N/A    SPECIALTY MEDICATION ADHERENCE     Medication Adherence    Patient reported X missed doses in the last month: 0  Specialty Medication: Mycophenolate 180mg   Patient is on additional specialty medications: Yes  Additional Specialty Medications: Tacrolimus 0.5mg   Patient Reported Additional Medication X Missed Doses in the Last Month: 0  Patient is on more than two specialty medications: No  Adherence tools used: patient uses a pill box to manage medications              Were doses missed due to medication being on hold? No    Mycophenolate 180 mg: 3 days of medicine on hand   Tacrolimus 0.5 mg: 3 days of medicine on hand       REFERRAL TO PHARMACIST     Referral to the pharmacist: Not needed      [facility]     Shipping address confirmed in Epic. Delivery Scheduled: Yes, Expected medication delivery date: 12/18/20.     Medication will be delivered via UPS to the prescription address in Epic WAM.    Nancy Nordmann [facility] Specialty Technician

## 2020-12-21 DIAGNOSIS — Z940 Kidney transplant status: Principal | ICD-10-CM

## 2021-01-05 ENCOUNTER — Ambulatory Visit: Admit: 2021-01-05 | Discharge: 2021-01-06 | Payer: MEDICAID

## 2021-01-05 DIAGNOSIS — R309 Painful micturition, unspecified: Principal | ICD-10-CM

## 2021-01-05 DIAGNOSIS — R8290 Unspecified abnormal findings in urine: Principal | ICD-10-CM

## 2021-01-05 LAB — URINALYSIS
BILIRUBIN UA: NEGATIVE
BLOOD UA: NEGATIVE
GLUCOSE UA: NEGATIVE
KETONES UA: NEGATIVE
NITRITE UA: POSITIVE — AB
PH UA: 6.5 (ref 5.0–9.0)
PROTEIN UA: NEGATIVE
RBC UA: 0 /HPF (ref <4)
SPECIFIC GRAVITY UA: <=1.005 (ref 1.005–1.040)
SQUAMOUS EPITHELIAL: 0 /HPF (ref 0–5)
UROBILINOGEN UA: 0.2
WBC UA: 35 /HPF — ABNORMAL HIGH (ref 0–5)

## 2021-01-05 LAB — PROTEIN / CREATININE RATIO, URINE
CREATININE, URINE: 23 mg/dL
PROTEIN URINE: <6 mg/dL

## 2021-01-05 NOTE — Unmapped (Signed)
Urine orders entered per coordinator request

## 2021-01-09 DIAGNOSIS — N390 Urinary tract infection, site not specified: Principal | ICD-10-CM

## 2021-01-09 DIAGNOSIS — Z79899 Other long term (current) drug therapy: Principal | ICD-10-CM

## 2021-01-09 MED ORDER — SULFAMETHOXAZOLE 800 MG-TRIMETHOPRIM 160 MG TABLET
ORAL_TABLET | Freq: Two times a day (BID) | ORAL | 0 refills | 7 days | Status: CP
Start: 2021-01-09 — End: 2021-01-16

## 2021-01-09 NOTE — Unmapped (Signed)
[facility] Refill Coordination Note    Specialty Medication(s) to be Shipped:   Transplant:  mycophenolic acid 180mg  and tacrolimus 0.5mg     Other medication(s) to be shipped: chlorthalidone, mag ox     Lynn Erickson, DOB: [birth year]  Phone: [phone] (home)       All above HIPAA information was verified with patient.     Was a Nurse, learning disability used for this call? No    Completed refill call assessment today to schedule patient's medication shipment from the [facility] [phone]).  All relevant notes have been reviewed.     Specialty medication(s) and dose(s) confirmed: Regimen is correct and unchanged.   Changes to medications: Ivorie reports no changes at this time.  Changes to insurance: No  New side effects reported not previously addressed with a pharmacist or physician: None reported  Questions for the pharmacist: No    Confirmed patient received a Conservation officer, historic buildings and a Surveyor, mining with first shipment. The patient will receive a drug information handout for each medication shipped and additional FDA Medication Guides as required.       DISEASE/MEDICATION-SPECIFIC INFORMATION        N/A    SPECIALTY MEDICATION ADHERENCE     Medication Adherence    Patient reported X missed doses in the last month: 0  Specialty Medication: tacrolimus (PROGRAF) 0.5 MG capsule  Patient is on additional specialty medications: Yes  Additional Specialty Medications: mycophenolate (MYFORTIC) 180 MG EC tablet  Patient Reported Additional Medication X Missed Doses in the Last Month: 0  Adherence tools used: patient uses a pill box to manage medications      mycophenolic acid 180mg   8 days worth of medication on hand.  tacrolimus 0.5mg   8 days worth of medication on hand.          Were doses missed due to medication being on hold? No        REFERRAL TO PHARMACIST     Referral to the pharmacist: Not needed      [facility]     Shipping address confirmed in Epic.     Delivery Scheduled: Yes, Expected medication delivery date: 01/12/21.     Medication will be delivered via UPS to the prescription address in Epic WAM.    Lynn Erickson   [facility] Specialty Technician

## 2021-01-11 MED FILL — TACROLIMUS 0.5 MG CAPSULE, IMMEDIATE-RELEASE: 30 days supply | Qty: 210 | Fill #9

## 2021-01-11 MED FILL — CHLORTHALIDONE 25 MG TABLET: ORAL | 90 days supply | Qty: 45 | Fill #2

## 2021-01-11 MED FILL — MAGNESIUM OXIDE 400 MG (241.3 MG MAGNESIUM) TABLET: ORAL | 30 days supply | Qty: 30 | Fill #8

## 2021-01-11 MED FILL — MYCOPHENOLATE SODIUM 180 MG TABLET,DELAYED RELEASE: ORAL | 30 days supply | Qty: 180 | Fill #9

## 2021-01-18 ENCOUNTER — Ambulatory Visit: Admit: 2021-01-18 | Discharge: 2021-01-19 | Payer: MEDICAID

## 2021-01-18 DIAGNOSIS — Z940 Kidney transplant status: Principal | ICD-10-CM

## 2021-01-18 NOTE — Unmapped (Signed)
Increased Cr 1.31  post bactrim completion for UTI reviewed with Dr Toni Arthurs. Advised to repeat labs in a week.

## 2021-01-25 ENCOUNTER — Ambulatory Visit: Admit: 2021-01-25 | Discharge: 2021-01-26 | Payer: MEDICAID

## 2021-02-09 ENCOUNTER — Ambulatory Visit: Admit: 2021-02-09 | Discharge: 2021-02-10 | Payer: MEDICAID

## 2021-02-09 LAB — PROTEIN / CREATININE RATIO, URINE
CREATININE, URINE: 46.8 mg/dL
PROTEIN URINE: <6 mg/dL

## 2021-02-09 LAB — CBC W/ AUTO DIFF
BASOPHILS ABSOLUTE COUNT: 0 10*9/L (ref 0.0–0.1)
BASOPHILS RELATIVE PERCENT: 0.9 %
EOSINOPHILS ABSOLUTE COUNT: 0.1 10*9/L (ref 0.0–0.5)
EOSINOPHILS RELATIVE PERCENT: 3.6 %
HEMATOCRIT: 29.2 % — ABNORMAL LOW (ref 34.0–44.0)
HEMOGLOBIN: 8.9 g/dL — ABNORMAL LOW (ref 11.3–14.9)
LYMPHOCYTES ABSOLUTE COUNT: 0.9 10*9/L — ABNORMAL LOW (ref 1.1–3.6)
LYMPHOCYTES RELATIVE PERCENT: 22.5 %
MEAN CORPUSCULAR HEMOGLOBIN CONC: 30.7 g/dL — ABNORMAL LOW (ref 32.0–36.0)
MEAN CORPUSCULAR HEMOGLOBIN: 18.8 pg — ABNORMAL LOW (ref 25.9–32.4)
MEAN CORPUSCULAR VOLUME: 61.4 fL — ABNORMAL LOW (ref 77.6–95.7)
MEAN PLATELET VOLUME: 8.7 fL (ref 6.8–10.7)
MONOCYTES ABSOLUTE COUNT: 0.4 10*9/L (ref 0.3–0.8)
MONOCYTES RELATIVE PERCENT: 10.7 %
NEUTROPHILS ABSOLUTE COUNT: 2.5 10*9/L (ref 1.8–7.8)
NEUTROPHILS RELATIVE PERCENT: 62.3 %
NUCLEATED RED BLOOD CELLS: 0 /100{WBCs} (ref <=4)
PLATELET COUNT: 187 10*9/L (ref 150–450)
RED BLOOD CELL COUNT: 4.75 10*12/L (ref 3.95–5.13)
RED CELL DISTRIBUTION WIDTH: 17.4 % — ABNORMAL HIGH (ref 12.2–15.2)
WBC ADJUSTED: 4 10*9/L (ref 3.6–11.2)

## 2021-02-09 LAB — COMPREHENSIVE METABOLIC PANEL
ALBUMIN: 4.3 g/dL (ref 3.4–5.0)
ALKALINE PHOSPHATASE: 104 U/L (ref 46–116)
ALT (SGPT): 13 U/L (ref 10–49)
ANION GAP: 9 mmol/L (ref 5–14)
AST (SGOT): 23 U/L (ref <=34)
BILIRUBIN TOTAL: 0.6 mg/dL (ref 0.3–1.2)
BLOOD UREA NITROGEN: 16 mg/dL (ref 9–23)
BUN / CREAT RATIO: 16
CALCIUM: 9.6 mg/dL (ref 8.7–10.4)
CHLORIDE: 112 mmol/L — ABNORMAL HIGH (ref 98–107)
CO2: 20.7 mmol/L (ref 20.0–31.0)
CREATININE: 0.98 mg/dL — ABNORMAL HIGH
EGFR CKD-EPI (2021) FEMALE: 68 mL/min/{1.73_m2} (ref >=60)
GLUCOSE RANDOM: 96 mg/dL (ref 70–179)
POTASSIUM: 4.4 mmol/L (ref 3.4–4.8)
PROTEIN TOTAL: 7.1 g/dL (ref 5.7–8.2)
SODIUM: 142 mmol/L (ref 135–145)

## 2021-02-09 LAB — SLIDE REVIEW

## 2021-02-09 LAB — URINALYSIS
BACTERIA: NONE SEEN /HPF
BILIRUBIN UA: NEGATIVE
BLOOD UA: NEGATIVE
GLUCOSE UA: NEGATIVE
KETONES UA: NEGATIVE
LEUKOCYTE ESTERASE UA: NEGATIVE
NITRITE UA: NEGATIVE
PH UA: 5.5 (ref 5.0–9.0)
PROTEIN UA: NEGATIVE
RBC UA: 0 /HPF (ref 0–3)
SPECIFIC GRAVITY UA: 1.01 (ref 1.005–1.040)
SQUAMOUS EPITHELIAL: 0 /HPF (ref 0–5)
UROBILINOGEN UA: 0.2
WBC UA: 3 /HPF (ref 0–3)

## 2021-02-09 LAB — PHOSPHORUS: PHOSPHORUS: 3.7 mg/dL (ref 2.4–5.1)

## 2021-02-09 LAB — MAGNESIUM: MAGNESIUM: 1.4 mg/dL — ABNORMAL LOW (ref 1.6–2.6)

## 2021-02-09 LAB — TACROLIMUS LEVEL, TROUGH: TACROLIMUS, TROUGH: 8.9 ng/mL (ref 5.0–15.0)

## 2021-02-13 NOTE — Unmapped (Signed)
[facility] Refill Coordination Note    Specialty Medication(s) to be Shipped:   Transplant:  mycophenolic acid 180mg  and tacrolimus 0.5mg     Other medication(s) to be shipped: mag ox     Lynn Erickson, DOB: August 26, 1965  Phone: [phone] (home)       All above HIPAA information was verified with patient.     Was a Nurse, learning disability used for this call? No    Completed refill call assessment today to schedule patient's medication shipment from the [facility] [phone]).  All relevant notes have been reviewed.     Specialty medication(s) and dose(s) confirmed: Regimen is correct and unchanged.   Changes to medications: Lynn Erickson reports no changes at this time.  Changes to insurance: No  New side effects reported not previously addressed with a pharmacist or physician: None reported  Questions for the pharmacist: No    Confirmed patient received a Conservation officer, historic buildings and a Surveyor, mining with first shipment. The patient will receive a drug information handout for each medication shipped and additional FDA Medication Guides as required.       DISEASE/MEDICATION-SPECIFIC INFORMATION        N/A    SPECIALTY MEDICATION ADHERENCE     Medication Adherence    Patient reported X missed doses in the last month: 0  Specialty Medication: tacrolimus (PROGRAF) 0.5 MG capsule  Patient is on additional specialty medications: Yes  Additional Specialty Medications: mycophenolate (MYFORTIC) 180 MG EC tablet  Patient Reported Additional Medication X Missed Doses in the Last Month: 0  Adherence tools used: patient uses a pill box to manage medications              Were doses missed due to medication being on hold? No     mycophenolic acid 180mg   7 days worth of medication on hand.  tacrolimus 0.5mg   7 days worth of medication on hand.      REFERRAL TO PHARMACIST     Referral to the pharmacist: Not needed      [facility]     Shipping address confirmed in Epic.     Delivery Scheduled: Yes, Expected medication delivery date: 02/15/21.     Medication will be delivered via UPS to the prescription address in Epic WAM.    Lynn Erickson   [facility] Specialty Technician

## 2021-02-14 MED FILL — MYCOPHENOLATE SODIUM 180 MG TABLET,DELAYED RELEASE: ORAL | 30 days supply | Qty: 180 | Fill #10

## 2021-02-14 MED FILL — MAGNESIUM OXIDE 400 MG (241.3 MG MAGNESIUM) TABLET: ORAL | 30 days supply | Qty: 30 | Fill #9

## 2021-02-14 MED FILL — TACROLIMUS 0.5 MG CAPSULE, IMMEDIATE-RELEASE: 30 days supply | Qty: 210 | Fill #10

## 2021-02-21 ENCOUNTER — Ambulatory Visit: Admit: 2021-02-21 | Discharge: 2021-02-22 | Payer: MEDICAID | Attending: Nephrology | Primary: Nephrology

## 2021-02-21 MED ORDER — CHLORTHALIDONE 25 MG TABLET
ORAL_TABLET | Freq: Every morning | ORAL | 11 refills | 30 days | Status: CP
Start: 2021-02-21 — End: ?
  Filled 2021-03-15: qty 30, 30d supply, fill #0

## 2021-02-21 MED ORDER — MAGNESIUM OXIDE 400 MG (241.3 MG MAGNESIUM) TABLET
ORAL_TABLET | Freq: Two times a day (BID) | ORAL | 11 refills | 30.00000 days | Status: CP
Start: 2021-02-21 — End: ?
  Filled 2021-03-08: qty 60, 30d supply, fill #0

## 2021-02-21 MED ORDER — TACROLIMUS 0.5 MG CAPSULE, IMMEDIATE-RELEASE
ORAL_CAPSULE | Freq: Two times a day (BID) | ORAL | 11 refills | 30.00000 days | Status: CP
Start: 2021-02-21 — End: ?
  Filled 2021-03-15: qty 180, 30d supply, fill #0

## 2021-02-22 NOTE — Unmapped (Signed)
Clinical Assessment Needed For: Dose Change  Medication: Tacrolimus 0.5mg  capsule  Last Fill Date/Day Supply: 02/14/2021 / 30 days  Copay $4  Was previous dose already scheduled to fill: No    Notes to Pharmacist: N/A

## 2021-02-22 NOTE — Unmapped (Signed)
Spoke to Lynn Erickson about her tacrolimus dose.  Confirmed that her current dose is 1.5mg  BID.  Pt acknowledged understanding.      [facility] Clinical Assessment & Refill Coordination Note    Lynn Erickson, DOB: June 04, 1966  Phone: [phone] (home)     All above HIPAA information was verified with patient.     Was a Nurse, learning disability used for this call? No    Specialty Medication(s):   Transplant: tacrolimus 0.5mg      Current Outpatient Medications   Medication Sig Dispense Refill   ??? chlorthalidone (HYGROTON) 25 MG tablet Take 1 tablet (25 mg total) by mouth every morning. 30 tablet 11   ??? cinacalcet (SENSIPAR) 30 MG tablet Take 1 tablet (30 mg total) by mouth daily. 90 tablet 3   ??? magnesium oxide (MAG-OX) 400 mg (241.3 mg elemental magnesium) tablet Take 1 tablet (400 mg total) by mouth Two (2) times a day. 60 tablet 11   ??? mycophenolate (MYFORTIC) 180 MG EC tablet Take 3 tablets (540 mg total) by mouth two (2) times a day. 180 tablet 11   ??? tacrolimus (PROGRAF) 0.5 MG capsule Take 3 capsules (1.5 mg total) by mouth two (2) times a day 180 capsule 11     No current facility-administered medications for this visit.        Changes to medications: [name] reports no changes at this time.    Allergies   Allergen Reactions   ??? Plaquenil [Hydroxychloroquine] Other (See Comments)     Toxic maculopathy   ??? Shellfish Containing Products Anaphylaxis   ??? Bleomycin    ??? Aspirin Rash   ??? Penicillins Rash     skin testing 12/13/2016 negative, oral challenging pending for 03/15/2017   ??? 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     Are there any concerns with adherence? No    Adherence counseling provided? Not needed    CLINICAL MANAGEMENT AND INTERVENTION      Clinical Benefit Assessment:    Do you feel the medicine is effective or helping your condition? Yes    Clinical Benefit counseling provided? Not needed    Adverse Effects Assessment:    Are you experiencing any side effects? No    Are you experiencing difficulty administering your medicine? No    Quality of Life Assessment:         How many days over the past month did your kidney transplant  keep you from your normal activities? For example, brushing your teeth or getting up in the morning. 0    Have you discussed this with your provider? Not needed    Acute Infection Status:    Acute infections noted within Epic:  No active infections  Patient reported infection: None    Therapy Appropriateness:    Is therapy appropriate? Yes, therapy is appropriate and should be continued    DISEASE/MEDICATION-SPECIFIC INFORMATION  N/A    PATIENT SPECIFIC NEEDS     - Does the patient have any physical, cognitive, or cultural barriers? No    - Is the patient high risk? Yes, patient is taking a REMS drug. Medication is dispensed in compliance with REMS program    - Does the patient require a Care Management Plan? No     - Does the patient require physician intervention or other additional services (i.e. nutrition, smoking cessation, social work)? No      SHIPPING     Specialty Medication(s) to be Shipped:   Transplant: None- patient declined refill on tacrolimus today.    Other medication(s) to be shipped: No additional medications requested for fill at this time     Changes to insurance: No    Delivery Scheduled: Patient declined refill at this time due to has 3 weeks on hand..     Medication will be delivered via UPS to the confirmed prescription address in [facility].    The patient will receive a drug information handout for each medication shipped and additional FDA Medication Guides as required.  Verified that patient has previously received a Conservation officer, historic buildings and a Surveyor, mining.    The patient or caregiver noted above participated in the development of this care plan and knows that they can request review of or adjustments to the care plan at any time.      All of the patient's questions and concerns have been addressed.    Tera Helper   [facility] Specialty Pharmacist

## 2021-03-02 DIAGNOSIS — Z940 Kidney transplant status: Principal | ICD-10-CM

## 2021-03-02 DIAGNOSIS — N189 Chronic kidney disease, unspecified: Principal | ICD-10-CM

## 2021-03-02 DIAGNOSIS — D631 Anemia in chronic kidney disease: Principal | ICD-10-CM

## 2021-03-02 NOTE — Unmapped (Signed)
[employer] transplant nephrology clinic visit    assessment and plan  1. s/p kidney transplant 10/12/2016. baseline creatinine 0.7-0.9 mg/dl. no proteinuria. +potential false positive donor specific hla ab detected '21.   2. immunosuppression. mycophenolate 540mg  bid. tacrolimus decr 1.5mg  bid re: 12hr lvl 5-8 ng/ml.  3. hypertension. blood pressure goal < 130/80 mmhg.   4. hypomagnesemia. mg oxide incr 400mg  bid.  5. microcytic hypochromic anemia. +beta thalassemia minor. iron panel, ferritin, reticulocyte count. screening colonoscopy due for 5 year follow up evaluation.   6. preventive medicine. influenza '19. pcv13 pneumococcal '17. ppsv23 pneumococcal '19. zoster '19. mrna covid-19 vaccine recommended. mammogram '19. gyn exam/pap smear '19. kidney ultrasound '22. colonoscopy '16 with 5 year follow up recommended.    history of present illness    [name] is a 55 year old woman seen in follow up post kidney transplant 10/12/2016. she is grieving the passing of her husband 12/21. +fatigue. +diminished appetite. +15-20lb weight loss since 12/21; stable in past 29m. mood ok. no fevers chills or sweats. no headache or lightheaded. no chest pain palpitations or shortness of breath. no orthopnea or lower extremity edema. no abdominal pain no n/v/d. no myalgias or arthralgias. no dysuria hematuria or difficulty voiding. memory ok/stable. all other systems reviewed and negative x10 systems.    past medical hx:  1. 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 baseline creatinine 0.7-0.9 mg/dl.  2. systemic lupus erythematosus   3. hypertension  4. secondary/tertiary hyperparathyroidism  5. beta thalassemia trait    past surgical hx: left upper ext av graft x2 '07-08. bilateral tubal ligation. kidney transplant '18. left upper ext avg ligation '19.  ??  allergies: penicillin vancomycin aspirin shellfish hydroxychloroquine.  ??  medications: tacrolimus 2mg /1.5mg  am/pm, mycophenolate 540mg  bid, chlorthalidone 25mg  daily, cinacalcet 30mg  daily, mg oxide 400mg  daily.     soc hx: widowed x4 children. no smoking hx     vital signs/patient reported: bp124/80 p77 wt59.9kg bmi 30.6.    labs 02/09/21: wbc4 hgb8.9 hct29.2 plts187. na142 k4.4 cl112 bicarb21 bun16 cr0.98 glc96 ca9.6 mg1.4 phos3.7 albumin4.3 liver function panel nl. tacrolimus lvl 8.9 ng/ml. urine protein not detected.    I spent 25 minutes on the phone with the patient on the date of service. I spent an additional 15 minutes on pre- and post-visit activities on the date of service.     The patient was physically located in [place] or a state in which I am permitted to provide care. The patient and/or parent/guardian understood that s/he may incur co-pays and cost sharing, and agreed to the telemedicine visit. The visit was reasonable and appropriate under the circumstances given the patient's presentation at the time.    The patient and/or parent/guardian has been advised of the potential risks and limitations of this mode of treatment (including, but not limited to, the absence of in-person examination) and has agreed to be treated using telemedicine. The patient's/patient's family's questions regarding telemedicine have been answered.     If the visit was completed in an ambulatory setting, the patient and/or parent/guardian has also been advised to contact their provider???s office for worsening conditions, and seek emergency medical treatment and/or call 911 if the patient deems either necessary.

## 2021-03-08 NOTE — Unmapped (Signed)
[name] Pharmacy Refill Coordination Note    Specialty Medication(s) to be Shipped:   Transplant: mycophenolate mofetil 180mg  and tacrolimus 0.5mg     Other medication(s) to be shipped: chlorthalidone and cinacalcet     Lynn Erickson, DOB: 01/08/1966  Phone: [phone] (home)       All above HIPAA information was verified with patient.     Was a Nurse, learning disability used for this call? No    Completed refill call assessment today to schedule patient's medication shipment from the [facility] [phone]).  All relevant notes have been reviewed.     Specialty medication(s) and dose(s) confirmed: Patient reports changes to the regimen as follows: Tacrolimus- Take 3 capsules (1.5 mg total) by mouth two (2) times a day **new rx is on profile**   Changes to medications: [name] reports no changes at this time.  Changes to insurance: No  New side effects reported not previously addressed with a pharmacist or physician: None reported  Questions for the pharmacist: No    Confirmed patient received a Conservation officer, historic buildings and a Surveyor, mining with first shipment. The patient will receive a drug information handout for each medication shipped and additional FDA Medication Guides as required.       DISEASE/MEDICATION-SPECIFIC INFORMATION        N/A    SPECIALTY MEDICATION ADHERENCE     Medication Adherence    Patient reported X missed doses in the last month: 0  Specialty Medication: Tacrolimus 0.5mg   Patient is on additional specialty medications: Yes  Additional Specialty Medications: Mycophenolate 180mg   Patient Reported Additional Medication X Missed Doses in the Last Month: 0  Patient is on more than two specialty medications: No  Adherence tools used: patient uses a pill box to manage medications        Were doses missed due to medication being on hold? No    Tacrolimus 0.5 mg: 9 days of medicine on hand   Mycophenolate 180 mg: 9 days of medicine on hand     REFERRAL TO PHARMACIST Referral to the pharmacist: Not needed      [name]     Shipping address confirmed in Epic.     Delivery Scheduled: Yes, Expected medication delivery date: 03/16/2021.     Medication will be delivered via UPS to the prescription address in Epic WAM.    [name] [name] Specialty Technician

## 2021-03-09 ENCOUNTER — Ambulatory Visit: Admit: 2021-03-09 | Discharge: 2021-03-10 | Payer: MEDICAID

## 2021-03-09 LAB — CBC W/ AUTO DIFF
BASOPHILS ABSOLUTE COUNT: 0 10*9/L (ref 0.0–0.1)
BASOPHILS RELATIVE PERCENT: 1.1 %
EOSINOPHILS ABSOLUTE COUNT: 0.2 10*9/L (ref 0.0–0.5)
EOSINOPHILS RELATIVE PERCENT: 4.3 %
HEMATOCRIT: 30.4 % — ABNORMAL LOW (ref 34.0–44.0)
HEMOGLOBIN: 9.5 g/dL — ABNORMAL LOW (ref 11.3–14.9)
LYMPHOCYTES ABSOLUTE COUNT: 0.9 10*9/L — ABNORMAL LOW (ref 1.1–3.6)
LYMPHOCYTES RELATIVE PERCENT: 25.2 %
MEAN CORPUSCULAR HEMOGLOBIN CONC: 31.3 g/dL — ABNORMAL LOW (ref 32.0–36.0)
MEAN CORPUSCULAR HEMOGLOBIN: 18.9 pg — ABNORMAL LOW (ref 25.9–32.4)
MEAN CORPUSCULAR VOLUME: 60.5 fL — ABNORMAL LOW (ref 77.6–95.7)
MEAN PLATELET VOLUME: 8.7 fL (ref 6.8–10.7)
MONOCYTES ABSOLUTE COUNT: 0.4 10*9/L (ref 0.3–0.8)
MONOCYTES RELATIVE PERCENT: 12.2 %
NEUTROPHILS ABSOLUTE COUNT: 2 10*9/L (ref 1.8–7.8)
NEUTROPHILS RELATIVE PERCENT: 57.2 %
NUCLEATED RED BLOOD CELLS: 0 /100{WBCs} (ref <=4)
PLATELET COUNT: 187 10*9/L (ref 150–450)
RED BLOOD CELL COUNT: 5.03 10*12/L (ref 3.95–5.13)
RED CELL DISTRIBUTION WIDTH: 15.8 % — ABNORMAL HIGH (ref 12.2–15.2)
WBC ADJUSTED: 3.5 10*9/L — ABNORMAL LOW (ref 3.6–11.2)

## 2021-03-09 LAB — COMPREHENSIVE METABOLIC PANEL
ALBUMIN: 4.4 g/dL (ref 3.4–5.0)
ALKALINE PHOSPHATASE: 120 U/L — ABNORMAL HIGH (ref 46–116)
ALT (SGPT): 11 U/L (ref 10–49)
ANION GAP: 12 mmol/L (ref 5–14)
AST (SGOT): 21 U/L (ref <=34)
BILIRUBIN TOTAL: 0.6 mg/dL (ref 0.3–1.2)
BLOOD UREA NITROGEN: 27 mg/dL — ABNORMAL HIGH (ref 9–23)
BUN / CREAT RATIO: 28
CALCIUM: 9.4 mg/dL (ref 8.7–10.4)
CHLORIDE: 106 mmol/L (ref 98–107)
CO2: 22.5 mmol/L (ref 20.0–31.0)
CREATININE: 0.98 mg/dL — ABNORMAL HIGH
EGFR CKD-EPI (2021) FEMALE: 68 mL/min/{1.73_m2} (ref >=60)
GLUCOSE RANDOM: 93 mg/dL (ref 70–179)
POTASSIUM: 4 mmol/L (ref 3.4–4.8)
PROTEIN TOTAL: 7.3 g/dL (ref 5.7–8.2)
SODIUM: 140 mmol/L (ref 135–145)

## 2021-03-09 LAB — IRON PANEL
IRON SATURATION (CALC): 36 %
IRON: 81 ug/dL
TOTAL IRON BINDING CAPACITY (CALC): 228.1 mg/dL
TRANSFERRIN: 181 mg/dL — ABNORMAL LOW

## 2021-03-09 LAB — MAGNESIUM: MAGNESIUM: 1.6 mg/dL (ref 1.6–2.6)

## 2021-03-09 LAB — SLIDE REVIEW

## 2021-03-09 LAB — URINALYSIS
BACTERIA: NONE SEEN /HPF
BILIRUBIN UA: NEGATIVE
BLOOD UA: NEGATIVE
GLUCOSE UA: NEGATIVE
KETONES UA: NEGATIVE
LEUKOCYTE ESTERASE UA: NEGATIVE
NITRITE UA: NEGATIVE
PH UA: 6 (ref 5.0–9.0)
PROTEIN UA: NEGATIVE
RBC UA: 0 /HPF (ref 0–3)
SPECIFIC GRAVITY UA: 1.015 (ref 1.005–1.040)
SQUAMOUS EPITHELIAL: 0 /HPF (ref 0–5)
UROBILINOGEN UA: 0.2
WBC UA: <1 /HPF (ref 0–3)

## 2021-03-09 LAB — PROTEIN / CREATININE RATIO, URINE
CREATININE, URINE: 65.8 mg/dL
PROTEIN URINE: 6.1 mg/dL
PROTEIN/CREAT RATIO, URINE: 0.093

## 2021-03-09 LAB — PHOSPHORUS: PHOSPHORUS: 4 mg/dL (ref 2.4–5.1)

## 2021-03-09 LAB — RETICULOCYTES
RETICULOCYTE ABSOLUTE COUNT: 50.2 10*9/L (ref 23.0–100.0)
RETICULOCYTE COUNT PCT: 1.02 % (ref 0.50–2.17)

## 2021-03-09 LAB — FERRITIN: FERRITIN: 1475.1 ng/mL — ABNORMAL HIGH

## 2021-03-10 LAB — TACROLIMUS LEVEL, TROUGH: TACROLIMUS, TROUGH: 6.3 ng/mL (ref 5.0–15.0)

## 2021-03-15 MED FILL — MYCOPHENOLATE SODIUM 180 MG TABLET,DELAYED RELEASE: ORAL | 30 days supply | Qty: 180 | Fill #11

## 2021-03-15 MED FILL — CINACALCET 30 MG TABLET: ORAL | 90 days supply | Qty: 90 | Fill #2

## 2021-04-04 DIAGNOSIS — Z940 Kidney transplant status: Principal | ICD-10-CM

## 2021-04-04 MED ORDER — MYCOPHENOLATE SODIUM 180 MG TABLET,DELAYED RELEASE
ORAL_TABLET | Freq: Two times a day (BID) | ORAL | 11 refills | 30 days | Status: CP
Start: 2021-04-04 — End: 2022-04-04
  Filled 2021-04-10: qty 180, 30d supply, fill #0

## 2021-04-04 MED FILL — MAGNESIUM OXIDE 400 MG (241.3 MG MAGNESIUM) TABLET: ORAL | 30 days supply | Qty: 60 | Fill #1

## 2021-04-04 NOTE — Unmapped (Signed)
[facility] Clinical Assessment & Refill Coordination Note    Lynn Erickson, DOB: 16-Aug-1965  Phone: [phone] (home)     All above HIPAA information was verified with patient.     Was a Nurse, learning disability used for this call? No    Specialty Medication(s):   Transplant:  mycophenolic acid 180mg  and tacrolimus 0.5mg      Current Outpatient Medications   Medication Sig Dispense Refill   ??? chlorthalidone (HYGROTON) 25 MG tablet Take 1 tablet (25 mg total) by mouth every morning. 30 tablet 11   ??? cinacalcet (SENSIPAR) 30 MG tablet Take 1 tablet (30 mg total) by mouth daily. 90 tablet 3   ??? magnesium oxide (MAG-OX) 400 mg (241.3 mg elemental magnesium) tablet Take 1 tablet (400 mg total) by mouth Two (2) times a day. 60 tablet 11   ??? mycophenolate (MYFORTIC) 180 MG EC tablet Take 3 tablets (540 mg total) by mouth two (2) times a day. 180 tablet 11   ??? tacrolimus (PROGRAF) 0.5 MG capsule Take 3 capsules (1.5 mg total) by mouth two (2) times a day 180 capsule 11     No current facility-administered medications for this visit.        Changes to medications: Layah reports no changes at this time.    Allergies   Allergen Reactions   ??? Plaquenil [Hydroxychloroquine] Other (See Comments)     Toxic maculopathy   ??? Shellfish Containing Products Anaphylaxis   ??? Bleomycin    ??? Aspirin Rash   ??? Penicillins Rash     skin testing 12/13/2016 negative, oral challenging pending for 03/15/2017   ??? 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       Changes to allergies: No    SPECIALTY MEDICATION ADHERENCE     Tacrolimus 0.5mg   : 12 days of medicine on hand   Mycophenolate 180mg   : 12 days of medicine on hand       Medication Adherence    Patient reported X missed doses in the last month: 0  Specialty Medication: tacrolimus 0.5mg   Patient is on additional specialty medications: Yes  Additional Specialty Medications: Mycophenolate 180mg   Patient Reported Additional Medication X Missed Doses in the Last Month: 0  Adherence tools used: patient uses a pill box to manage medications          Specialty medication(s) dose(s) confirmed: Regimen is correct and unchanged.     Are there any concerns with adherence? No    Adherence counseling provided? Not needed    CLINICAL MANAGEMENT AND INTERVENTION      Clinical Benefit Assessment:    Do you feel the medicine is effective or helping your condition? Yes    Clinical Benefit counseling provided? Not needed    Adverse Effects Assessment:    Are you experiencing any side effects? No    Are you experiencing difficulty administering your medicine? No    Quality of Life Assessment:         How many days over the past month did your transplant  keep you from your normal activities? For example, brushing your teeth or getting up in the morning. 0    Have you discussed this with your provider? Not needed    Acute Infection Status:    Acute infections noted within Epic:  No active infections  Patient reported infection: None    Therapy Appropriateness:    Is therapy appropriate and patient progressing towards therapeutic  goals? Yes, therapy is appropriate and should be continued    DISEASE/MEDICATION-SPECIFIC INFORMATION      N/A    PATIENT SPECIFIC NEEDS     - Does the patient have any physical, cognitive, or cultural barriers? No    - Is the patient high risk? Yes, patient is taking a REMS drug. Medication is dispensed in compliance with REMS program    - Does the patient require a Care Management Plan? No     - Does the patient require physician intervention or other additional services (i.e. nutrition, smoking cessation, social work)? No      SHIPPING     Specialty Medication(s) to be Shipped:   Transplant:  mycophenolic acid 180mg  and tacrolimus 0.5mg     Other medication(s) to be shipped: chlorthalidone, magox     Changes to insurance: No    Delivery Scheduled: Yes, Expected medication delivery date: 04/05/2021 for magox, 04/11/2021 for chlorthalidone, mycopehenolate, and tacrolimus.  However, Rx request for refills was sent to the provider as there are none remaining.     Medication will be delivered via UPS to the confirmed prescription address in [facility].    The patient will receive a drug information handout for each medication shipped and additional FDA Medication Guides as required.  Verified that patient has previously received a Conservation officer, historic buildings and a Surveyor, mining.    The patient or caregiver noted above participated in the development of this care plan and knows that they can request review of or adjustments to the care plan at any time.      All of the patient's questions and concerns have been addressed.    Thad Ranger   [facility] Specialty Pharmacist

## 2021-04-04 NOTE — Unmapped (Signed)
Pt request for RX Refill    mycophenolate (MYFORTIC) 180 MG EC tablet

## 2021-04-10 MED FILL — TACROLIMUS 0.5 MG CAPSULE, IMMEDIATE-RELEASE: ORAL | 30 days supply | Qty: 180 | Fill #1

## 2021-04-10 MED FILL — CHLORTHALIDONE 25 MG TABLET: ORAL | 30 days supply | Qty: 30 | Fill #1

## 2021-05-02 NOTE — Unmapped (Signed)
[facility] Refill Coordination Note    Specialty Medication(s) to be Shipped:   Transplant: mycophenolate mofetil 180mg  and tacrolimus 0.5mg     Other medication(s) to be shipped: magnesium and chlorthalidone      Lynn Erickson, DOB: [birth year]  Phone: [phone] (home)       All above HIPAA information was verified with patient.     Was a Nurse, learning disability used for this call? No    Completed refill call assessment today to schedule patient's medication shipment from the [facility] [phone]).  All relevant notes have been reviewed.     Specialty medication(s) and dose(s) confirmed: Regimen is correct and unchanged.   Changes to medications: Elina reports no changes at this time.  Changes to insurance: No  New side effects reported not previously addressed with a pharmacist or physician: None reported  Questions for the pharmacist: No    Confirmed patient received a Conservation officer, historic buildings and a Surveyor, mining with first shipment. The patient will receive a drug information handout for each medication shipped and additional FDA Medication Guides as required.       DISEASE/MEDICATION-SPECIFIC INFORMATION        N/A    SPECIALTY MEDICATION ADHERENCE     Medication Adherence    Patient reported X missed doses in the last month: 0  Specialty Medication: Mycophenolate 180mg   Patient is on additional specialty medications: Yes  Additional Specialty Medications: Tacrolimus 0.5mg   Patient Reported Additional Medication X Missed Doses in the Last Month: 0  Patient is on more than two specialty medications: No  Adherence tools used: patient uses a pill box to manage medications        Were doses missed due to medication being on hold? No    Mycophenolate 180 mg: 10 days of medicine on hand   Tacrolimus 0.5 mg: 10 days of medicine on hand     REFERRAL TO PHARMACIST     Referral to the pharmacist: Not needed      [facility]     Shipping address confirmed in Epic.     Delivery Scheduled: Yes, Expected medication delivery date: 05/11/2021.     Medication will be delivered via UPS to the prescription address in Epic WAM.    [name] [facility] Specialty Technician

## 2021-05-10 MED FILL — MYCOPHENOLATE SODIUM 180 MG TABLET,DELAYED RELEASE: ORAL | 30 days supply | Qty: 180 | Fill #1

## 2021-05-10 MED FILL — MAGNESIUM OXIDE 400 MG (241.3 MG MAGNESIUM) TABLET: ORAL | 30 days supply | Qty: 60 | Fill #2

## 2021-05-10 MED FILL — TACROLIMUS 0.5 MG CAPSULE, IMMEDIATE-RELEASE: ORAL | 30 days supply | Qty: 180 | Fill #2

## 2021-05-10 MED FILL — CHLORTHALIDONE 25 MG TABLET: ORAL | 30 days supply | Qty: 30 | Fill #2

## 2021-06-07 NOTE — Unmapped (Signed)
Spoke with patient. She has 3+ weeks of mycophenolate, tacrolimus and all other medications. I scheduled magox for delivery. Pushing call out 2 weeks. No questions for the pharmacist.

## 2021-06-08 MED FILL — MAGNESIUM OXIDE 400 MG (241.3 MG MAGNESIUM) TABLET: ORAL | 30 days supply | Qty: 60 | Fill #3

## 2021-06-20 NOTE — Unmapped (Signed)
[facility] Refill Coordination Note    Specialty Medication(s) to be Shipped:   Transplant: mycophenolate mofetil 180mg , tacrolimus 0.5mg  and Cinacalcet 30mg   Other medication(s) to be shipped: Chlorthalidone 25mg      Lynn Erickson, DOB: [birth year]  Phone: [phone] (home)     All above HIPAA information was verified with patient.     Was a Nurse, learning disability used for this call? No    Completed refill call assessment today to schedule patient's medication shipment from the [facility] [phone]).  All relevant notes have been reviewed.     Specialty medication(s) and dose(s) confirmed: Regimen is correct and unchanged.   Changes to medications: Adelee reports no changes at this time.  Changes to insurance: No  New side effects reported not previously addressed with a pharmacist or physician: None reported  Questions for the pharmacist: No    Confirmed patient received a Conservation officer, historic buildings and a Surveyor, mining with first shipment. The patient will receive a drug information handout for each medication shipped and additional FDA Medication Guides as required.       DISEASE/MEDICATION-SPECIFIC INFORMATION        N/A    SPECIALTY MEDICATION ADHERENCE     Medication Adherence    Patient reported X missed doses in the last month: 0  Specialty Medication: Mycophenolate 180mg   Patient is on additional specialty medications: Yes  Additional Specialty Medications: Tacrolimus 0.5mg   Patient Reported Additional Medication X Missed Doses in the Last Month: 0  Patient is on more than two specialty medications: No  Informant: patient  Reliability of informant: reliable  Reasons for non-adherence: no problems identified  Adherence tools used: patient uses a pill box to manage medications        Were doses missed due to medication being on hold? No    Tacrolimus 0.5mg  : 7-10 days of medicine on hand   Mycophenolate 180mg  : 7-10 days of medicine on hand   Cinacalcet 30mg  : 7-10 days of medicine on hand     REFERRAL TO PHARMACIST     Referral to the pharmacist: Not needed    [facility]     Shipping address confirmed in Epic.     Delivery Scheduled: Yes, Expected medication delivery date: 06/27/2021.     Medication will be delivered via UPS to the prescription address in Epic WAM.    Shantia [facility] [place] [facility] Specialty Technician

## 2021-06-21 NOTE — Unmapped (Signed)
Lynn Erickson called back about the delivery for her entire shipment and would like the delivery to ship out 06/21/2021 via UPS or [employer] to be delivered 06/22/2021 . We have confirmed the delivery via UPS delivery .

## 2021-06-22 MED FILL — CINACALCET 30 MG TABLET: ORAL | 90 days supply | Qty: 90 | Fill #3

## 2021-06-22 MED FILL — TACROLIMUS 0.5 MG CAPSULE, IMMEDIATE-RELEASE: ORAL | 30 days supply | Qty: 180 | Fill #3

## 2021-06-22 MED FILL — MYCOPHENOLATE SODIUM 180 MG TABLET,DELAYED RELEASE: ORAL | 30 days supply | Qty: 180 | Fill #2

## 2021-06-22 MED FILL — CHLORTHALIDONE 25 MG TABLET: ORAL | 30 days supply | Qty: 30 | Fill #3

## 2021-07-11 MED FILL — MAGNESIUM OXIDE 400 MG (241.3 MG MAGNESIUM) TABLET: ORAL | 30 days supply | Qty: 60 | Fill #4

## 2021-07-18 NOTE — Unmapped (Signed)
[facility] Refill Coordination Note    Specialty Medication(s) to be Shipped:   Transplant: tacrolimus 0.5mg  and mycophenolate 180mg     Other medication(s) to be shipped: chlorthalidone     Lynn Erickson, DOB: 1966/02/06  Phone: [phone] (home)       All above HIPAA information was verified with patient.     Was a Nurse, learning disability used for this call? No    Completed refill call assessment today to schedule patient's medication shipment from the [facility] [phone]).  All relevant notes have been reviewed.     Specialty medication(s) and dose(s) confirmed: Regimen is correct and unchanged.   Changes to medications: Arraya reports no changes at this time.  Changes to insurance: No  New side effects reported not previously addressed with a pharmacist or physician: None reported  Questions for the pharmacist: No    Confirmed patient received a Conservation officer, historic buildings and a Surveyor, mining with first shipment. The patient will receive a drug information handout for each medication shipped and additional FDA Medication Guides as required.       DISEASE/MEDICATION-SPECIFIC INFORMATION        N/A    SPECIALTY MEDICATION ADHERENCE     Medication Adherence    Patient reported X missed doses in the last month: 0  Specialty Medication: mycophenolate 180 MG  Patient is on additional specialty medications: Yes  Additional Specialty Medications: tacrolimus 0.5 MG    Patient Reported Additional Medication X Missed Doses in the Last Month: 0  Patient is on more than two specialty medications: No  Adherence tools used: patient uses a pill box to manage medications              Were doses missed due to medication being on hold? No    Tacrolimus 0.5 mg: 6 days of medicine on hand   Mycophenolate 180 mg: 6 days of medicine on hand        REFERRAL TO PHARMACIST     Referral to the pharmacist: Not needed      [facility]     Shipping address confirmed in Epic.     Delivery Scheduled: Yes, Expected medication delivery date: 07/20/21.     Medication will be delivered via Same Day Courier to the prescription address in Epic WAM.    Unk Lightning   [facility] Specialty Technician

## 2021-07-20 MED FILL — CHLORTHALIDONE 25 MG TABLET: ORAL | 30 days supply | Qty: 30 | Fill #4

## 2021-07-20 MED FILL — MYCOPHENOLATE SODIUM 180 MG TABLET,DELAYED RELEASE: ORAL | 30 days supply | Qty: 180 | Fill #3

## 2021-07-20 MED FILL — TACROLIMUS 0.5 MG CAPSULE, IMMEDIATE-RELEASE: ORAL | 30 days supply | Qty: 180 | Fill #4

## 2021-08-02 DIAGNOSIS — N390 Urinary tract infection, site not specified: Principal | ICD-10-CM

## 2021-08-02 MED ORDER — CEPHALEXIN 500 MG CAPSULE
ORAL_CAPSULE | Freq: Two times a day (BID) | ORAL | 0 refills | 7 days | Status: CP
Start: 2021-08-02 — End: 2021-08-09

## 2021-08-03 ENCOUNTER — Ambulatory Visit: Admit: 2021-08-03 | Discharge: 2021-08-04 | Payer: MEDICAID

## 2021-08-03 LAB — URINALYSIS
BACTERIA: NONE SEEN /HPF
BILIRUBIN UA: NEGATIVE
BLOOD UA: NEGATIVE
GLUCOSE UA: NEGATIVE
HYALINE CASTS: 1 /LPF (ref 0–1)
KETONES UA: NEGATIVE
NITRITE UA: NEGATIVE
PH UA: 6 (ref 5.0–9.0)
PROTEIN UA: NEGATIVE
RBC UA: 5 /HPF — ABNORMAL HIGH (ref <=4)
SPECIFIC GRAVITY UA: 1.014 (ref 1.003–1.030)
SQUAMOUS EPITHELIAL: <1 /HPF (ref 0–5)
UROBILINOGEN UA: <2
WBC UA: 12 /HPF — ABNORMAL HIGH (ref 0–5)

## 2021-08-03 LAB — CBC W/ AUTO DIFF
BASOPHILS ABSOLUTE COUNT: 0.1 10*9/L (ref 0.0–0.1)
BASOPHILS RELATIVE PERCENT: 1.2 %
EOSINOPHILS ABSOLUTE COUNT: 0.1 10*9/L (ref 0.0–0.5)
EOSINOPHILS RELATIVE PERCENT: 1.5 %
HEMATOCRIT: 30.5 % — ABNORMAL LOW (ref 34.0–44.0)
HEMOGLOBIN: 9.4 g/dL — ABNORMAL LOW (ref 11.3–14.9)
LYMPHOCYTES ABSOLUTE COUNT: 0.8 10*9/L — ABNORMAL LOW (ref 1.1–3.6)
LYMPHOCYTES RELATIVE PERCENT: 17.5 %
MEAN CORPUSCULAR HEMOGLOBIN CONC: 30.8 g/dL — ABNORMAL LOW (ref 32.0–36.0)
MEAN CORPUSCULAR HEMOGLOBIN: 18.6 pg — ABNORMAL LOW (ref 25.9–32.4)
MEAN CORPUSCULAR VOLUME: 60.4 fL — ABNORMAL LOW (ref 77.6–95.7)
MEAN PLATELET VOLUME: 9.5 fL (ref 6.8–10.7)
MONOCYTES ABSOLUTE COUNT: 0.6 10*9/L (ref 0.3–0.8)
MONOCYTES RELATIVE PERCENT: 13 %
NEUTROPHILS ABSOLUTE COUNT: 3.2 10*9/L (ref 1.8–7.8)
NEUTROPHILS RELATIVE PERCENT: 66.8 %
NUCLEATED RED BLOOD CELLS: 0 /100{WBCs} (ref <=4)
PLATELET COUNT: 204 10*9/L (ref 150–450)
RED BLOOD CELL COUNT: 5.06 10*12/L (ref 3.95–5.13)
RED CELL DISTRIBUTION WIDTH: 16.4 % — ABNORMAL HIGH (ref 12.2–15.2)
WBC ADJUSTED: 4.8 10*9/L (ref 3.6–11.2)

## 2021-08-03 LAB — COMPREHENSIVE METABOLIC PANEL
ALBUMIN: 4.2 g/dL (ref 3.4–5.0)
ALKALINE PHOSPHATASE: 102 U/L (ref 46–116)
ALT (SGPT): 9 U/L — ABNORMAL LOW (ref 10–49)
ANION GAP: 9 mmol/L (ref 5–14)
AST (SGOT): 20 U/L (ref <=34)
BILIRUBIN TOTAL: 0.6 mg/dL (ref 0.3–1.2)
BLOOD UREA NITROGEN: 16 mg/dL (ref 9–23)
BUN / CREAT RATIO: 14
CALCIUM: 9.8 mg/dL (ref 8.7–10.4)
CHLORIDE: 101 mmol/L (ref 98–107)
CO2: 24.2 mmol/L (ref 20.0–31.0)
CREATININE: 1.17 mg/dL — ABNORMAL HIGH
EGFR CKD-EPI (2021) FEMALE: 55 mL/min/{1.73_m2} — ABNORMAL LOW (ref >=60)
GLUCOSE RANDOM: 102 mg/dL (ref 70–179)
POTASSIUM: 3.7 mmol/L (ref 3.4–4.8)
PROTEIN TOTAL: 7.2 g/dL (ref 5.7–8.2)
SODIUM: 134 mmol/L — ABNORMAL LOW (ref 135–145)

## 2021-08-03 LAB — MAGNESIUM: MAGNESIUM: 1.5 mg/dL — ABNORMAL LOW (ref 1.6–2.6)

## 2021-08-03 LAB — PHOSPHORUS: PHOSPHORUS: 2.8 mg/dL (ref 2.4–5.1)

## 2021-08-03 NOTE — Unmapped (Signed)
Patient called the clinic and I called her back.  Reports pain at transplant site, at the bottom of her abdomen, dysuria, and fever with chills last night chills.  She has not had labs since October 09, 2022due to the untimely death of her husband. Advised patient to got the ER ER. Called with patient back and discussed transportation issues, and discussed with Dr Toni Arthurs. Patient advised to start keflex 500 mg BID for 7 days and get labs tomorrow. Will follow up on labs in the morning for further change in management.

## 2021-08-04 LAB — TACROLIMUS LEVEL, TROUGH: TACROLIMUS, TROUGH: 6.7 ng/mL (ref 5.0–15.0)

## 2021-08-04 NOTE — Unmapped (Signed)
Reason for Disposition  ??? General information question, no triage required and triager able to answer question    Answer Assessment - Initial Assessment Questions  1. REASON FOR CALL or QUESTION: What is your reason for calling today? or How can I best help you? or What question do you have that I can help answer?      Trying to get through to Kidney Transplant on call for lab results. Warm transfer to hospital operator. Gave pt hospital operator phone #.    Protocols used: INFORMATION ONLY CALL - NO TRIAGE-A-AH

## 2021-08-06 NOTE — Unmapped (Signed)
Patient state she is feeling ok, no fever and abdominal pain is gone. Advised to  repeat labs and urine post antibiotics treatment.

## 2021-08-10 MED FILL — MAGNESIUM OXIDE 400 MG (241.3 MG MAGNESIUM) TABLET: ORAL | 30 days supply | Qty: 60 | Fill #5

## 2021-08-10 NOTE — Unmapped (Signed)
[facility] Refill Coordination Note    Specialty Medication(s) to be Shipped:   Transplant:  mycophenolic acid 180mg  and tacrolimus 0.5mg     Other medication(s) to be shipped: chlorthalidone 25mg      Lynn Erickson, DOB: 04-15-1966  Phone: [phone] (home)       All above HIPAA information was verified with patient.     Was a Nurse, learning disability used for this call? No    Completed refill call assessment today to schedule patient's medication shipment from the [facility] [phone]).  All relevant notes have been reviewed.     Specialty medication(s) and dose(s) confirmed: Regimen is correct and unchanged.   Changes to medications: Lynn Erickson reports no changes at this time.  Changes to insurance: No  New side effects reported not previously addressed with a pharmacist or physician: None reported  Questions for the pharmacist: No    Confirmed patient received a Conservation officer, historic buildings and a Surveyor, mining with first shipment. The patient will receive a drug information handout for each medication shipped and additional FDA Medication Guides as required.       DISEASE/MEDICATION-SPECIFIC INFORMATION        N/A    SPECIALTY MEDICATION ADHERENCE     Medication Adherence    Patient reported X missed doses in the last month: 0  Specialty Medication: mycophenolate 180 MG  Patient is on additional specialty medications: Yes  Additional Specialty Medications: tacrolimus 0.5 MG    Patient Reported Additional Medication X Missed Doses in the Last Month: 0  Patient is on more than two specialty medications: No  Informant: patient  Adherence tools used: patient uses a pill box to manage medications              Were doses missed due to medication being on hold? No    Mycophenolate 180 mg: 9 days of medicine on hand   Tacrolimus 0.5 mg: 9 days of medicine on hand     REFERRAL TO PHARMACIST     Referral to the pharmacist: Not needed      [facility]     Shipping address confirmed in Epic. Delivery Scheduled: Yes, Expected medication delivery date: 08/13/21.     Medication will be delivered via Same Day Courier to the prescription address in Epic WAM.    Lynn Erickson   [facility] Specialty Technician

## 2021-08-13 DIAGNOSIS — Z940 Kidney transplant status: Principal | ICD-10-CM

## 2021-08-13 MED FILL — MYCOPHENOLATE SODIUM 180 MG TABLET,DELAYED RELEASE: ORAL | 30 days supply | Qty: 180 | Fill #4

## 2021-08-13 MED FILL — TACROLIMUS 0.5 MG CAPSULE, IMMEDIATE-RELEASE: ORAL | 30 days supply | Qty: 180 | Fill #5

## 2021-08-13 MED FILL — CHLORTHALIDONE 25 MG TABLET: ORAL | 30 days supply | Qty: 30 | Fill #5

## 2021-08-14 ENCOUNTER — Ambulatory Visit: Admit: 2021-08-14 | Discharge: 2021-08-15 | Payer: MEDICAID

## 2021-08-14 LAB — URINALYSIS
BACTERIA: NONE SEEN /HPF
BILIRUBIN UA: NEGATIVE
BLOOD UA: NEGATIVE
GLUCOSE UA: NEGATIVE
KETONES UA: NEGATIVE
LEUKOCYTE ESTERASE UA: NEGATIVE
NITRITE UA: NEGATIVE
PH UA: 6.5 (ref 5.0–9.0)
PROTEIN UA: NEGATIVE
RBC UA: <1 /HPF (ref <=4)
SPECIFIC GRAVITY UA: 1.01 (ref 1.003–1.030)
SQUAMOUS EPITHELIAL: <1 /HPF (ref 0–5)
UROBILINOGEN UA: <2
WBC UA: 1 /HPF (ref 0–5)

## 2021-09-04 NOTE — Unmapped (Addendum)
[facility] Clinical Assessment & Refill Coordination Note    Lynn Erickson, DOB: 06-03-1966  Phone: [phone] (home)     All above HIPAA information was verified with patient.     Was a Nurse, learning disability used for this call? No    Specialty Medication(s):   Transplant:  mycophenolic acid 180mg  and tacrolimus 0.5mg      Current Outpatient Medications   Medication Sig Dispense Refill   ??? chlorthalidone (HYGROTON) 25 MG tablet Take 1 tablet (25 mg total) by mouth every morning. 30 tablet 11   ??? cinacalcet (SENSIPAR) 30 MG tablet Take 1 tablet (30 mg total) by mouth daily. 90 tablet 3   ??? magnesium oxide (MAG-OX) 400 mg (241.3 mg elemental magnesium) tablet Take 1 tablet (400 mg total) by mouth Two (2) times a day. 60 tablet 11   ??? mycophenolate (MYFORTIC) 180 MG EC tablet Take 3 tablets (540 mg total) by mouth two (2) times a day. 180 tablet 11   ??? tacrolimus (PROGRAF) 0.5 MG capsule Take 3 capsules (1.5 mg total) by mouth two (2) times a day 180 capsule 11     No current facility-administered medications for this visit.        Changes to medications: Jezabella reports no changes at this time.    Allergies   Allergen Reactions   ??? Plaquenil [Hydroxychloroquine] Other (See Comments)     Toxic maculopathy   ??? Shellfish Containing Products Anaphylaxis   ??? Bleomycin    ??? Aspirin Rash   ??? Penicillins Rash     skin testing 12/13/2016 negative, oral challenging pending for 03/15/2017   ??? 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       Changes to allergies: No    SPECIALTY MEDICATION ADHERENCE     Mycophenolate 180 mg: 15 days of medicine on hand   Tacrolimus 0.5 mg: 15 days of medicine on hand       Medication Adherence    Patient reported X missed doses in the last month: 0  Specialty Medication: Mycophenolate 180mg   Patient is on additional specialty medications: Yes  Additional Specialty Medications: Tacrolimus 0.5mg   Patient Reported Additional Medication X Missed Doses in the Last Month: 0  Patient is on more than two specialty medications: No  Adherence tools used: patient uses a pill box to manage medications          Specialty medication(s) dose(s) confirmed: Regimen is correct and unchanged.     Are there any concerns with adherence? No    Adherence counseling provided? Not needed    CLINICAL MANAGEMENT AND INTERVENTION      Clinical Benefit Assessment:    Do you feel the medicine is effective or helping your condition? Yes    Clinical Benefit counseling provided? Not needed    Adverse Effects Assessment:    Are you experiencing any side effects? No    Are you experiencing difficulty administering your medicine? No    Quality of Life Assessment:         How many days over the past month did your kidney transplant  keep you from your normal activities? For example, brushing your teeth or getting up in the morning. 0    Have you discussed this with your provider? Not needed    Acute Infection Status:    Acute infections noted within Epic:  No active infections  Patient reported infection: None    Therapy Appropriateness:  Is therapy appropriate and patient progressing towards therapeutic goals? Yes, therapy is appropriate and should be continued    DISEASE/MEDICATION-SPECIFIC INFORMATION      N/A    PATIENT SPECIFIC NEEDS     - Does the patient have any physical, cognitive, or cultural barriers? No    - Is the patient high risk? Yes, patient is taking a REMS drug. Medication is dispensed in compliance with REMS program    - Does the patient require a Care Management Plan? No     SOCIAL DETERMINANTS OF HEALTH     At the [facility], we have learned that life circumstances - like trouble affording food, housing, utilities, or transportation can affect the health of many of our patients.   That is why we wanted to ask: are you currently experiencing any life circumstances that are negatively impacting your health and/or quality of life? Yes transportation    Social Determinants of Health     Food Insecurity: Not on file   Tobacco Use: Low Risk    ??? Smoking Tobacco Use: Never   ??? Smokeless Tobacco Use: Never   ??? Passive Exposure: Not on file   Transportation Needs: Not on file   Alcohol Use: Not on file   Housing/Utilities: Not on file   Substance Use: Not on file   Financial Resource Strain: Not on file   Physical Activity: Not on file   Health Literacy: Not on file   Stress: Not on file   Intimate Partner Violence: Not on file   Depression: Not on file   Social Connections: Not on file       Would you be willing to receive help with any of the needs that you have identified today? Yes       SHIPPING     Specialty Medication(s) to be Shipped:   Transplant: None- patient declined tacrolimus and mycophenolate today.    Other medication(s) to be shipped: Mg, chlorthalidone     Changes to insurance: No    Delivery Scheduled: Yes, Expected medication delivery date: 09/10/21.     Medication will be delivered via UPS to the confirmed prescription address in [facility].    The patient will receive a drug information handout for each medication shipped and additional FDA Medication Guides as required.  Verified that patient has previously received a Conservation officer, historic buildings and a Surveyor, mining.    The patient or caregiver noted above participated in the development of this care plan and knows that they can request review of or adjustments to the care plan at any time.      All of the patient's questions and concerns have been addressed.    Tera Helper   [facility] Specialty Pharmacist

## 2021-09-06 NOTE — Unmapped (Signed)
Addended byNonie Hoyer on: 09/06/2021 10:40 AM     Modules accepted: Orders

## 2021-09-06 NOTE — Unmapped (Signed)
COMMUNITY HEALTH WORKER  Outreach Note    09/06/2021    Situation:    Referral received from: Other: Pharmacist  Reason for referral: Resource Coordination    Background:    After speaking with patient, they state the following history about their needs:  CHW contacted pt base on referral.  Pt stated she needed help with transportation to her doctors appt.    Assessment:    CHW discussed the following topics with patient:   Transportation    CHW gathered the following income & household size information:   N/A     CHW identified the following barriers to care: Transportation    Recommendations:  CHW provided pt with phone number for [employer] since she has active Medicaid.    Additional Comments/Actions:  N/A    Next Follow-up: N/A    Note Routed: Yes.  Routed to:  Nonie Hoyer, RPH     Signed: Berline Lopes Elugardo

## 2021-09-07 MED FILL — CHLORTHALIDONE 25 MG TABLET: ORAL | 30 days supply | Qty: 30 | Fill #6

## 2021-09-07 MED FILL — MAGNESIUM OXIDE 400 MG (241.3 MG MAGNESIUM) TABLET: ORAL | 30 days supply | Qty: 60 | Fill #6

## 2021-09-17 MED ORDER — CINACALCET 30 MG TABLET
ORAL_TABLET | Freq: Every day | ORAL | 3 refills | 90 days
Start: 2021-09-17 — End: 2022-09-17

## 2021-09-17 NOTE — Unmapped (Signed)
The [facility] has made a second and final attempt to reach this patient to refill the following medication: mycophenolate, tacrolimus and cinacalcet.      We have left voicemails on the following phone numbers: [phone].    Dates contacted: 03/08 & 03/13  Last scheduled delivery: 08/14/2021    The patient may be at risk of non-compliance with this medication. The patient should call the [facility] at [phone]  Option 4, then Option 2 (all other specialty patients) to refill medication.    Lynn Erickson   [facility] Specialty Technician

## 2021-09-17 NOTE — Unmapped (Signed)
[facility] Refill Coordination Note    Specialty Medication(s) to be Shipped:   Transplant:  mycophenolic acid 180mg  and tacrolimus 0.5mg     Other medication(s) to be shipped: No additional medications requested for fill at this time     Lynn Erickson, DOB: 1966-02-22  Phone: [phone] (home)       All above HIPAA information was verified with patient.     Was a Nurse, learning disability used for this call? No    Completed refill call assessment today to schedule patient's medication shipment from the [facility] [phone]).  All relevant notes have been reviewed.     Specialty medication(s) and dose(s) confirmed: Regimen is correct and unchanged.   Changes to medications: Lynn Erickson reports no changes at this time.  Changes to insurance: No  New side effects reported not previously addressed with a pharmacist or physician: None reported  Questions for the pharmacist: No    Confirmed patient received a Conservation officer, historic buildings and a Surveyor, mining with first shipment. The patient will receive a drug information handout for each medication shipped and additional FDA Medication Guides as required.       DISEASE/MEDICATION-SPECIFIC INFORMATION        N/A    SPECIALTY MEDICATION ADHERENCE     Medication Adherence    Patient reported X missed doses in the last month: 0  Specialty Medication: mycophenolate 180 MG EC tablet (MYFORTIC)  Patient is on additional specialty medications: Yes  Additional Specialty Medications: tacrolimus 0.5 MG capsule (PROGRAF)  Patient Reported Additional Medication X Missed Doses in the Last Month: 0  Patient is on more than two specialty medications: No  Informant: patient  Adherence tools used: patient uses a pill box to manage medications              Were doses missed due to medication being on hold? No    Tacrolimus 0.5 mg: 7 days of medicine on hand   Mycophenolate 180 mg: 2 days of medicine on hand       REFERRAL TO PHARMACIST     Referral to the pharmacist: Not needed      [facility]     Shipping address confirmed in Epic.     Delivery Scheduled: Yes, Expected medication delivery date: 09/19/21.     Medication will be delivered via Next Day Courier to the prescription address in Epic [place].    Lynn Erickson   [facility] Specialty Technician

## 2021-09-18 MED ORDER — CINACALCET 30 MG TABLET
ORAL_TABLET | Freq: Every day | ORAL | 3 refills | 90 days | Status: CP
Start: 2021-09-18 — End: 2022-09-18
  Filled 2021-09-18: qty 90, 90d supply, fill #0

## 2021-09-18 MED FILL — TACROLIMUS 0.5 MG CAPSULE, IMMEDIATE-RELEASE: ORAL | 30 days supply | Qty: 180 | Fill #6

## 2021-09-18 MED FILL — MYCOPHENOLATE SODIUM 180 MG TABLET,DELAYED RELEASE: ORAL | 30 days supply | Qty: 180 | Fill #5

## 2021-10-10 MED FILL — MAGNESIUM OXIDE 400 MG (241.3 MG MAGNESIUM) TABLET: ORAL | 30 days supply | Qty: 60 | Fill #7

## 2021-10-10 NOTE — Unmapped (Signed)
[facility] Refill Coordination Note    Specialty Medication(s) to be Shipped:   Transplant: mycophenolate mofetil 180mg  and tacrolimus 0.5mg     Other medication(s) to be shipped: chlorthalidone     Lynn Erickson, DOB: 1965/09/26  Phone: [phone] (home)       All above HIPAA information was verified with patient.     Was a Nurse, learning disability used for this call? No    Completed refill call assessment today to schedule patient's medication shipment from the [facility] [phone]).  All relevant notes have been reviewed.     Specialty medication(s) and dose(s) confirmed: Regimen is correct and unchanged.   Changes to medications: Lynn Erickson reports no changes at this time.  Changes to insurance: No  New side effects reported not previously addressed with a pharmacist or physician: None reported  Questions for the pharmacist: No    Confirmed patient received a Conservation officer, historic buildings and a Surveyor, mining with first shipment. The patient will receive a drug information handout for each medication shipped and additional FDA Medication Guides as required.       DISEASE/MEDICATION-SPECIFIC INFORMATION        N/A    SPECIALTY MEDICATION ADHERENCE     Medication Adherence    Patient reported X missed doses in the last month: 0  Specialty Medication: mycophenolate 180 MG EC tablet (MYFORTIC)  Patient is on additional specialty medications: Yes  Additional Specialty Medications: tacrolimus 0.5 MG capsule (PROGRAF)  Patient Reported Additional Medication X Missed Doses in the Last Month: 0  Patient is on more than two specialty medications: No  Adherence tools used: patient uses a pill box to manage medications        Were doses missed due to medication being on hold? No    Mycophenolate 180 mg: 8 days of medicine on hand   Tacrolimus 0.5 mg: 8 days of medicine on hand      REFERRAL TO PHARMACIST     Referral to the pharmacist: Not needed      [facility]     Shipping address confirmed in Epic. Delivery Scheduled: Yes, Expected medication delivery date: 10/16/2021.     Medication will be delivered via UPS to the prescription address in Epic WAM.    [name] [facility] Specialty Technician

## 2021-10-15 MED FILL — MYCOPHENOLATE SODIUM 180 MG TABLET,DELAYED RELEASE: ORAL | 30 days supply | Qty: 180 | Fill #6

## 2021-10-15 MED FILL — TACROLIMUS 0.5 MG CAPSULE, IMMEDIATE-RELEASE: ORAL | 30 days supply | Qty: 180 | Fill #7

## 2021-10-15 MED FILL — CHLORTHALIDONE 25 MG TABLET: ORAL | 30 days supply | Qty: 30 | Fill #7

## 2021-11-06 NOTE — Unmapped (Signed)
[facility] Refill Coordination Note    Specialty Medication(s) to be Shipped:   Transplant: mycophenolate mofetil 180mg  and tacrolimus 0.5mg   Other medication(s) to be shipped: Mag Ox 400mg  & Chlorthalidone 25mg      Lynn Erickson, DOB: 09/07/1965  Phone: [phone] (home)     All above HIPAA information was verified with patient.     Was a Nurse, learning disability used for this call? No    Completed refill call assessment today to schedule patient's medication shipment from the [facility] [phone]).  All relevant notes have been reviewed.     Specialty medication(s) and dose(s) confirmed: Regimen is correct and unchanged.   Changes to medications: Lynn Erickson reports no changes at this time.  Changes to insurance: No  New side effects reported not previously addressed with a pharmacist or physician: None reported  Questions for the pharmacist: No    Confirmed patient received a Conservation officer, historic buildings and a Surveyor, mining with first shipment. The patient will receive a drug information handout for each medication shipped and additional FDA Medication Guides as required.       DISEASE/MEDICATION-SPECIFIC INFORMATION        N/A    SPECIALTY MEDICATION ADHERENCE     Medication Adherence    Patient reported X missed doses in the last month: 0  Specialty Medication: Mycophenolate 180mg   Patient is on additional specialty medications: Yes  Additional Specialty Medications: Tacrolimus 0.5mg   Patient Reported Additional Medication X Missed Doses in the Last Month: 0  Patient is on more than two specialty medications: No  Any gaps in refill history greater than 2 weeks in the last 3 months: no  Informant: patient  Reliability of informant: reliable  Reasons for non-adherence: no problems identified  Adherence tools used: patient uses a pill box to manage medications        Were doses missed due to medication being on hold? No    Tacrolimus 0.5 mg: 7-9 days of medicine on hand   Mycophenolate 180 mg: 7-9 days of medicine on hand     REFERRAL TO PHARMACIST     Referral to the pharmacist: Not needed    [facility]     Shipping address confirmed in Epic.     Delivery Scheduled: Yes, Expected medication delivery date: 11/13/2021.     Medication will be delivered via UPS to the prescription address in Epic WAM.    Lynn Erickson [facility] Specialty Technician

## 2021-11-12 MED FILL — MYCOPHENOLATE SODIUM 180 MG TABLET,DELAYED RELEASE: ORAL | 30 days supply | Qty: 180 | Fill #7

## 2021-11-12 MED FILL — TACROLIMUS 0.5 MG CAPSULE, IMMEDIATE-RELEASE: ORAL | 30 days supply | Qty: 180 | Fill #8

## 2021-11-12 MED FILL — MAGNESIUM OXIDE 400 MG (241.3 MG MAGNESIUM) TABLET: ORAL | 30 days supply | Qty: 60 | Fill #8

## 2021-11-12 MED FILL — CHLORTHALIDONE 25 MG TABLET: ORAL | 30 days supply | Qty: 30 | Fill #8

## 2021-12-10 NOTE — Unmapped (Signed)
[facility] Special[facility] Pharmacy Refill Coordination Note    Specialty Medication(s) to be Shipped:   Transplant: mycophenolate mofetil 180mg  and tacrolimus 0.5mg     Other medication(s) to be shipped: chlorthalidone,cinacalcet,magnesium      Lynn Erickson, DOB: [birth year]  Phone: [phone] (home)       All above HIPAA information was verified with patient.     Was a Nurse, learning disability used for this call? No    Completed refill call assessment today to schedule patient's medication shipment from the [facility] [phone]).  All relevant notes have been reviewed.     Specialty medication(s) and dose(s) confirmed: Regimen is correct and unchanged.   Changes to medications: [name] reports no changes at this time.  Changes to insurance: No  New side effects reported not previously addressed with a pharmacist or physician: None reported  Questions for the pharmacist: No    Confirmed patient received a Conservation officer, historic buildings and a Surveyor, mining with first shipment. The patient will receive a drug information handout for each medication shipped and additional FDA Medication Guides as required.       DISEASE/MEDICATION-SPECIFIC INFORMATION        N/A    SPECIALTY MEDICATION ADHERENCE     Medication Adherence    Patient reported X missed doses in the last month: 0  Specialty Medication: mycophenolate 180 MG  Patient is on additional specialty medications: Yes  Additional Specialty Medications: tacrolimus 0.5 MG   Patient Reported Additional Medication X Missed Doses in the Last Month: 0  Patient is on more than two specialty medications: No  Any gaps in refill history greater than 2 weeks in the last 3 months: no  Demonstrates understanding of importance of adherence: yes  Informant: patient  Reliability of informant: reliable  Provider-estimated medication adherence level: good  Patient is at risk for Non-Adherence: No  Reasons for non-adherence: no problems identified  Adherence tools used: patient uses a pill box to manage medications  Confirmed plan for next specialty medication refill: delivery by pharmacy  Refills needed for supportive medications: not needed          Refill Coordination    Has the Patients' Contact Information Changed: No  Is the Shipping Address Different: No         Were doses missed due to medication being on hold? No    tacrolimus 0.5 mg: 8 days of medicine on hand   Mycophenolate 180   mg: 8 days of medicine on hand         REFERRAL TO PHARMACIST     Referral to the pharmacist: Not needed      [facility]     Shipping address confirmed in Epic.     Delivery Scheduled: Yes, Expected medication delivery date: 06/07.     Medication will be delivered via UPS to the prescription address in Epic WAM.    Antonietta Barcelona   [facility] Specialty Technician

## 2021-12-11 IMAGING — DX DG CHEST 1V PORT
1 series · 1 of 1 positions shown · non-contrast
Comparison: None.

CLINICAL DATA: Shortness of breath and weakness.  COVID positive.

EXAM:
PORTABLE CHEST 1 VIEW

[chest ap]
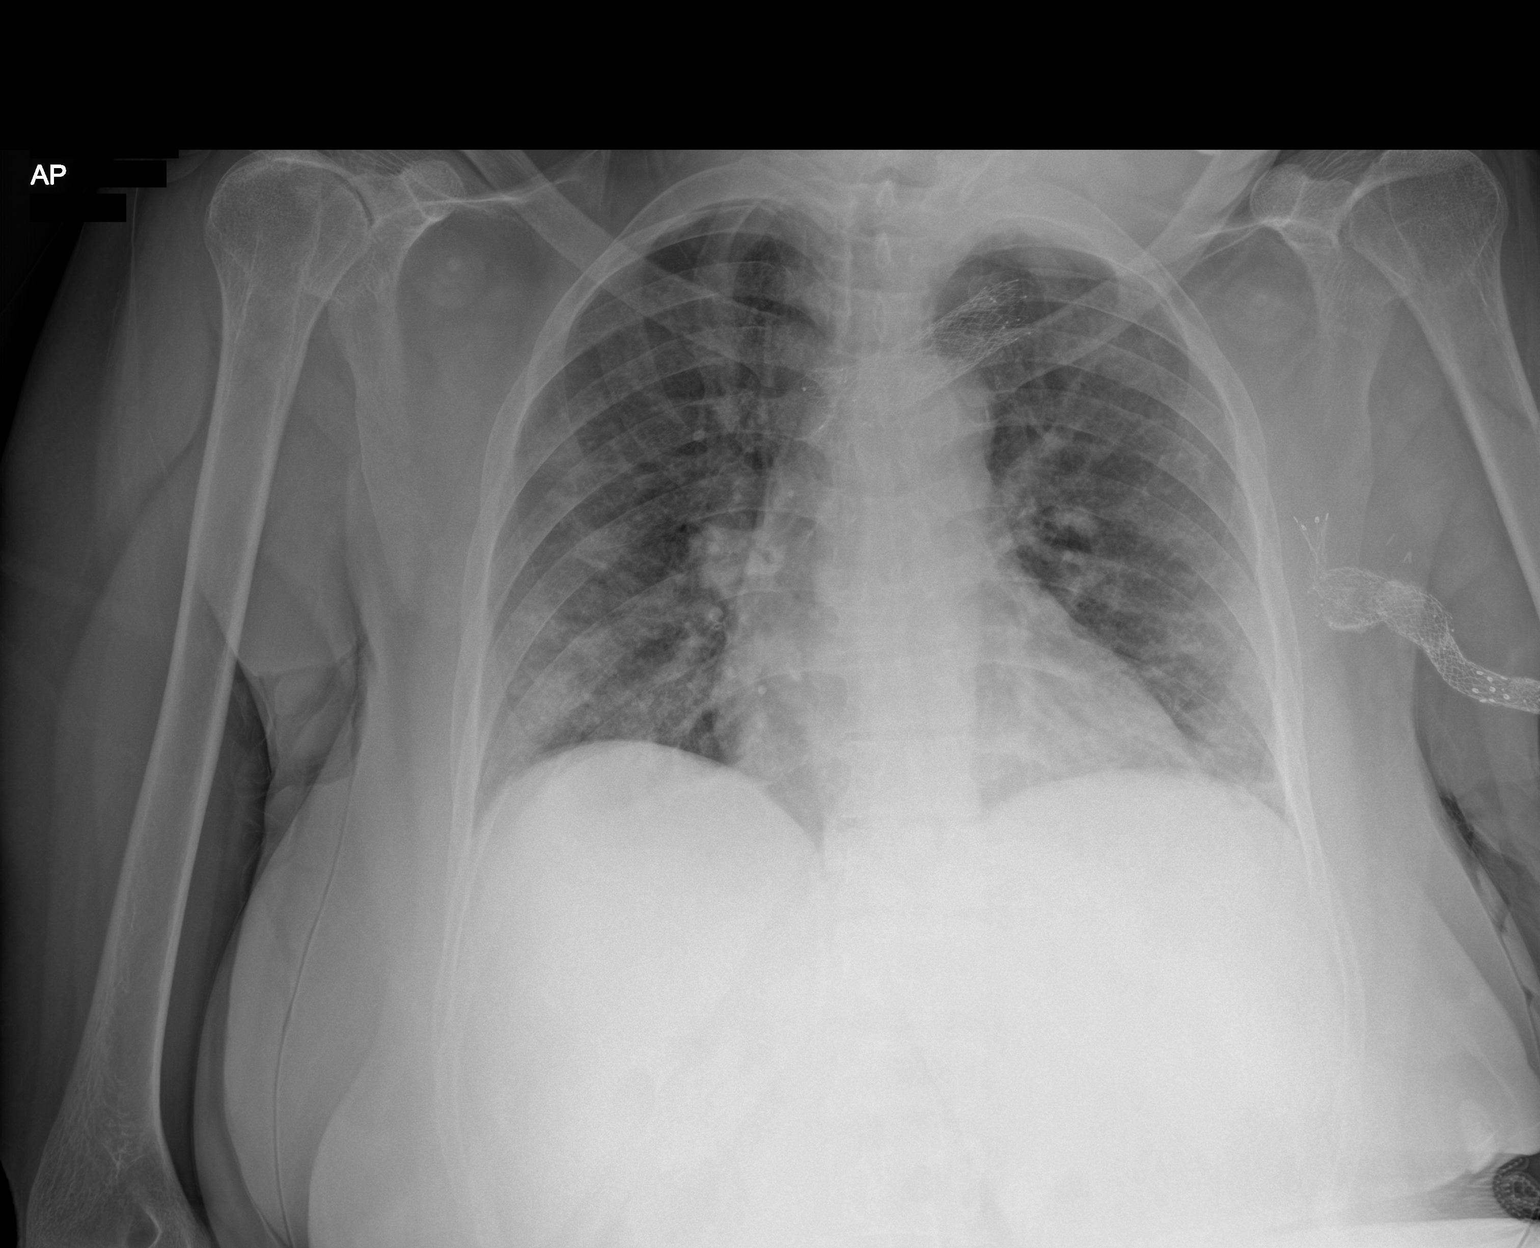

[1 of 1 positions shown; findings below may reference images not displayed]

FINDINGS: Mild ill-defined multifocal infiltrates are seen involving the mid
and lower lung fields, right greater than left. There is no evidence
of a pleural effusion or pneumothorax. The heart size and
mediastinal contours are within normal limits. Radiopaque vascular
stents are seen overlying the superior mediastinum and left axilla.
The visualized skeletal structures are unremarkable.
IMPRESSION: Mild multifocal bilateral infiltrates, right greater than left.

## 2021-12-11 MED FILL — MAGNESIUM OXIDE 400 MG (241.3 MG MAGNESIUM) TABLET: ORAL | 30 days supply | Qty: 60 | Fill #9

## 2021-12-11 MED FILL — CHLORTHALIDONE 25 MG TABLET: ORAL | 30 days supply | Qty: 30 | Fill #9

## 2021-12-11 MED FILL — CINACALCET 30 MG TABLET: ORAL | 90 days supply | Qty: 90 | Fill #1

## 2021-12-11 MED FILL — TACROLIMUS 0.5 MG CAPSULE, IMMEDIATE-RELEASE: ORAL | 30 days supply | Qty: 180 | Fill #9

## 2021-12-11 MED FILL — MYCOPHENOLATE SODIUM 180 MG TABLET,DELAYED RELEASE: ORAL | 30 days supply | Qty: 180 | Fill #8

## 2021-12-13 IMAGING — DX DG CHEST 1V
1 series · 1 of 1 positions shown · non-contrast
Comparison: May 31, 2020

CLINICAL DATA: Chest pain.  COVID positive.

EXAM:
CHEST  1 VIEW

[chest ap]
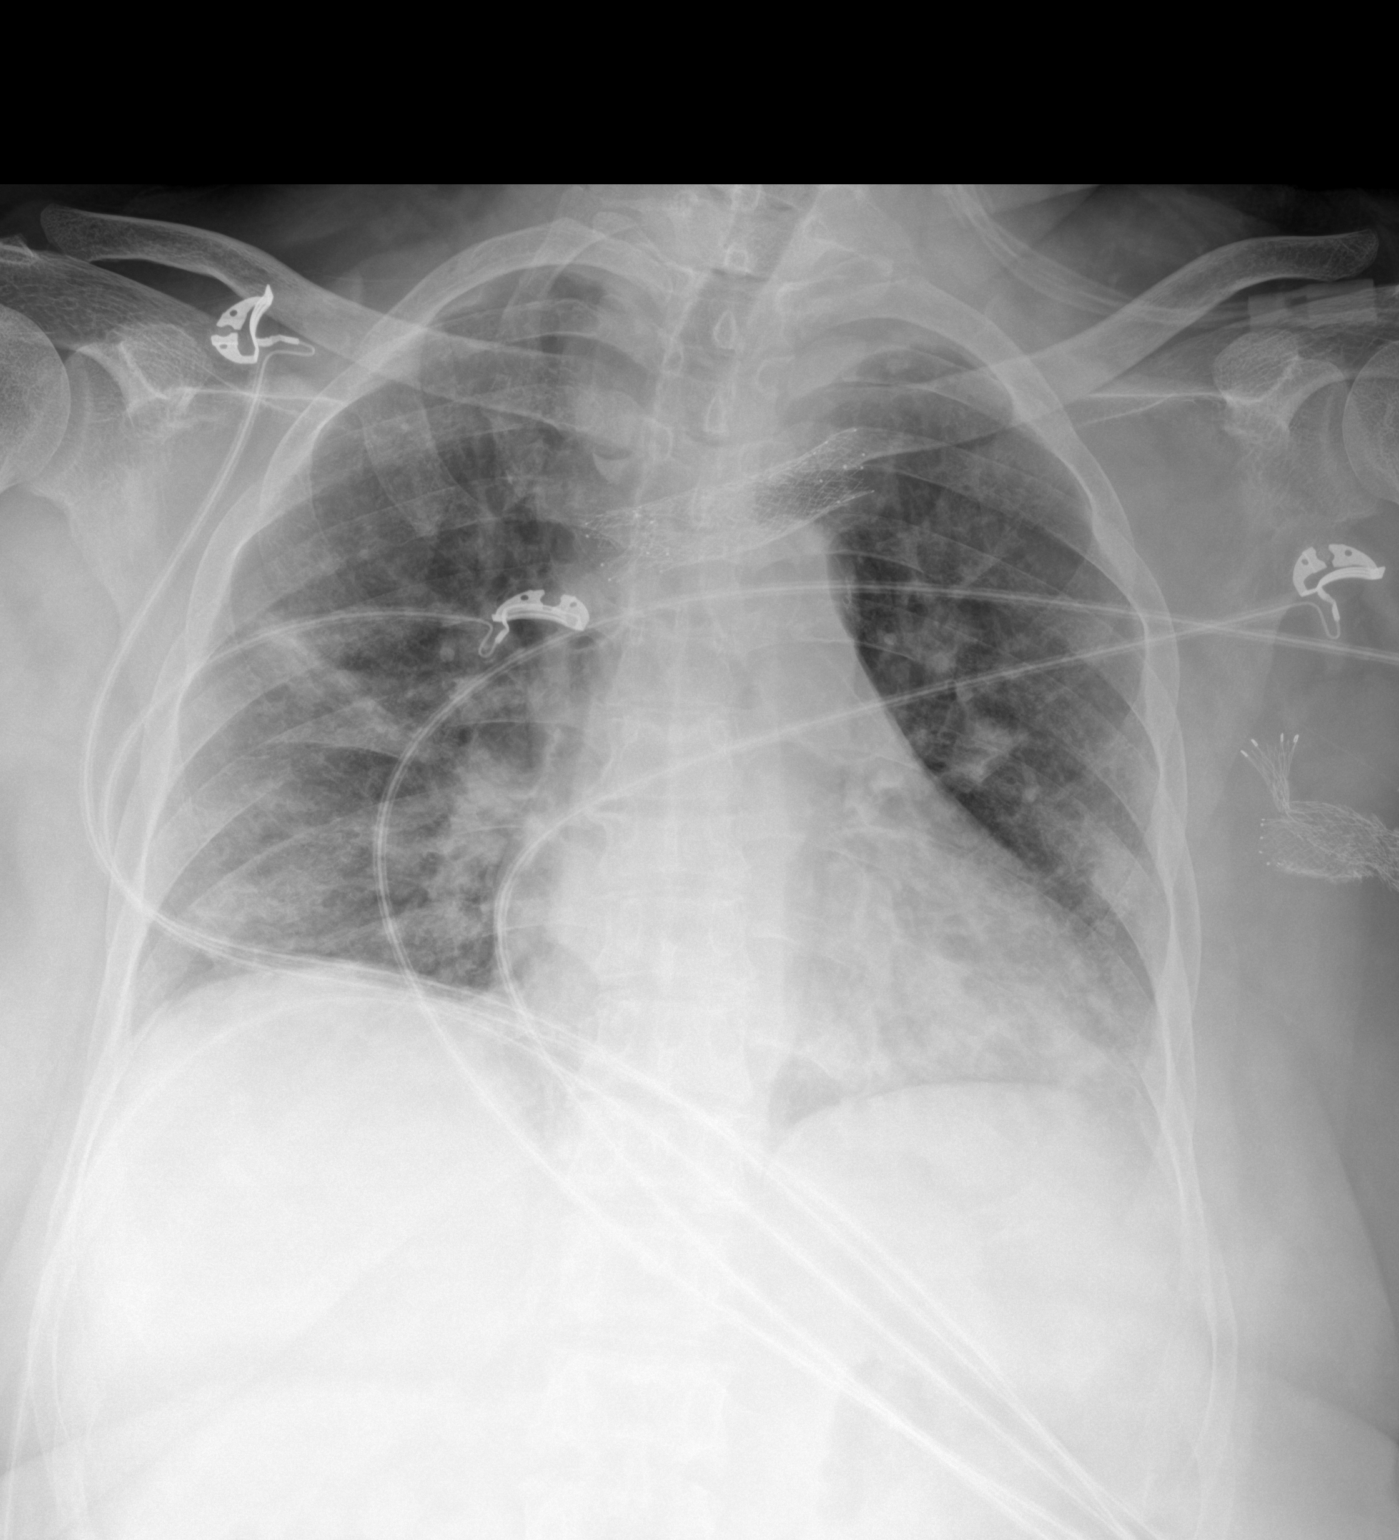

[1 of 1 positions shown; findings below may reference images not displayed]

FINDINGS: Mild, ill-defined, bilateral multifocal infiltrates are seen. This
is very mildly increased in severity when compared to the prior
study. There is no evidence of a pleural effusion or pneumothorax.
The heart size and mediastinal contours are within normal limits.
Radiopaque vascular stents are seen overlying the superior
mediastinum and left axilla. The visualized skeletal structures are
unremarkable.
IMPRESSION: Mild, bilateral multifocal infiltrates, mildly increased in severity
when compared to the prior study.

## 2021-12-14 IMAGING — NM NM PULMONARY PERF PARTICULATE
1 series · 8 of 8 positions shown · non-contrast
Comparison: 06/02/2020

CLINICAL DATA: Evaluate for pulmonary embolus. Shortness of breath
and sharp pain and upper medial abdomen. History of kidney
transplant.

EXAM:
NUCLEAR MEDICINE PERFUSION LUNG SCAN
TECHNIQUE: Perfusion images were obtained in multiple projections after
intravenous injection of radiopharmaceutical.
Ventilation scans intentionally deferred if perfusion scan and chest
x-ray adequate for interpretation during COVID 19 epidemic.
RADIOPHARMACEUTICALS:  4.04 mCi Rc-HHm MAA IV

[Series 1000: lung perfusion · 1.65mm/px · 4 acquisitions, 8 frames shown]
[im 1/4  full-range]
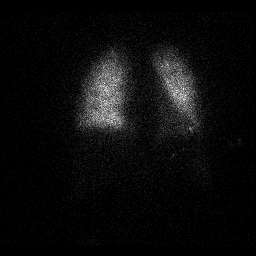
[im 1/4  full-range]
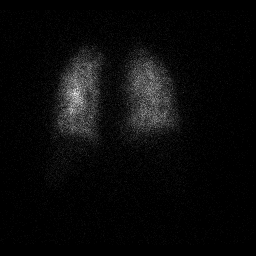
[im 2/4  full-range]
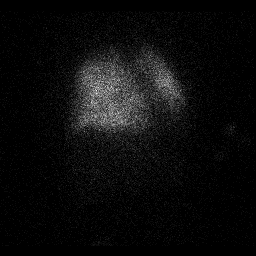
[im 2/4  full-range]
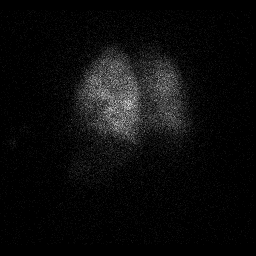
[im 3/4  full-range]
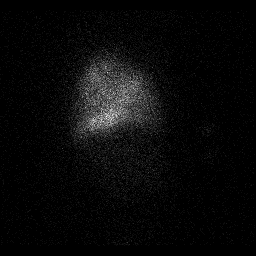
[im 3/4  full-range]
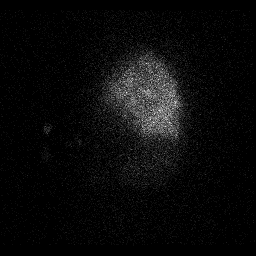
[im 4/4  full-range]
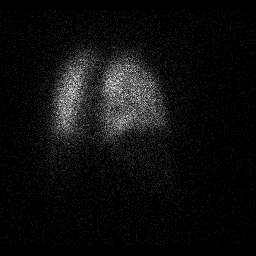
[im 4/4  full-range]
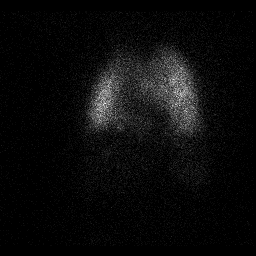

[8 of 8 positions shown; findings below may reference images not displayed]

FINDINGS: There is a heterogeneous distribution of the radiopharmaceutical
throughout both lungs. No medium or large peripheral segmental
perfusion defects identified to suggest acute pulmonary embolus.
IMPRESSION: 1. No findings to suggest acute pulmonary embolus.

## 2021-12-22 DIAGNOSIS — Z940 Kidney transplant status: Principal | ICD-10-CM

## 2022-01-02 NOTE — Unmapped (Signed)
[facility] Pharmacy Refill Coordination Note    Specialty Medication(s) to be Shipped:   Transplant: mycophenolate mofetil 180mg  and tacrolimus 1mg     Other medication(s) to be shipped:  chlorthalidone and mag-ox     Lynn Erickson, DOB: [birth year]  Phone: [phone] (home)       All above HIPAA information was verified with patient.     Was a Nurse, learning disability used for this call? No    Completed refill call assessment today to schedule patient's medication shipment from the [facility] [phone]).  All relevant notes have been reviewed.     Specialty medication(s) and dose(s) confirmed: Regimen is correct and unchanged.   Changes to medications: Lynn Erickson reports no changes at this time.  Changes to insurance: No  New side effects reported not previously addressed with a pharmacist or physician: None reported  Questions for the pharmacist: No    Confirmed patient received a Conservation officer, historic buildings and a Surveyor, mining with first shipment. The patient will receive a drug information handout for each medication shipped and additional FDA Medication Guides as required.       DISEASE/MEDICATION-SPECIFIC INFORMATION        N/A    SPECIALTY MEDICATION ADHERENCE     Medication Adherence    Patient reported X missed doses in the last month: 0  Specialty Medication: mycophenolate 180 mg  Patient is on additional specialty medications: Yes  Additional Specialty Medications: Tacrolimus 0.5 mg   Patient Reported Additional Medication X Missed Doses in the Last Month: 0  Patient is on more than two specialty medications: No  Adherence tools used: patient uses a pill box to manage medications              Were doses missed due to medication being on hold? No    Mycophenolate  180 mg: 10 days of medicine on hand   tacrolimus 0.5 mg: 10 days of medicine on hand     REFERRAL TO PHARMACIST     Referral to the pharmacist: Not needed      [facility]     Shipping address confirmed in Epic.     Delivery Scheduled: Yes, Expected medication delivery date: 01/11/22.     Medication will be delivered via UPS to the prescription address in Epic WAM.    Lynn Erickson   [facility] Specialty Technician

## 2022-01-03 DIAGNOSIS — Z940 Kidney transplant status: Principal | ICD-10-CM

## 2022-01-11 MED FILL — MAGNESIUM OXIDE 400 MG (241.3 MG MAGNESIUM) TABLET: ORAL | 30 days supply | Qty: 60 | Fill #10

## 2022-01-11 MED FILL — CHLORTHALIDONE 25 MG TABLET: ORAL | 30 days supply | Qty: 30 | Fill #10

## 2022-01-11 MED FILL — MYCOPHENOLATE SODIUM 180 MG TABLET,DELAYED RELEASE: ORAL | 30 days supply | Qty: 180 | Fill #9

## 2022-01-11 MED FILL — TACROLIMUS 0.5 MG CAPSULE, IMMEDIATE-RELEASE: ORAL | 30 days supply | Qty: 180 | Fill #10

## 2022-02-06 NOTE — Unmapped (Signed)
[facility] Pharmacy Clinical Assessment & Refill Coordination Note    Lynn Erickson, DOB: Aug 29, 1965  Phone: [phone] (home)     All above HIPAA information was verified with patient.     Was a Nurse, learning disability used for this call? No    Specialty Medication(s):   Transplant:  mycophenolic acid 180mg  and tacrolimus 0.5mg      Current Outpatient Medications   Medication Sig Dispense Refill   ??? chlorthalidone (HYGROTON) 25 MG tablet Take 1 tablet (25 mg total) by mouth every morning. 30 tablet 11   ??? cinacalcet (SENSIPAR) 30 MG tablet Take 1 tablet (30 mg total) by mouth daily. 90 tablet 3   ??? magnesium oxide (MAG-OX) 400 mg (241.3 mg elemental magnesium) tablet Take 1 tablet (400 mg total) by mouth Two (2) times a day. 60 tablet 11   ??? mycophenolate (MYFORTIC) 180 MG EC tablet Take 3 tablets (540 mg total) by mouth two (2) times a day. 180 tablet 11   ??? tacrolimus (PROGRAF) 0.5 MG capsule Take 3 capsules (1.5 mg total) by mouth two (2) times a day 180 capsule 11     No current facility-administered medications for this visit.        Changes to medications: Lynn Erickson reports no changes at this time.    Allergies   Allergen Reactions   ??? Plaquenil [Hydroxychloroquine] Other (See Comments)     Toxic maculopathy   ??? Shellfish Containing Products Anaphylaxis   ??? Bleomycin    ??? Aspirin Rash   ??? Penicillins Rash     skin testing 12/13/2016 negative, oral challenging pending for 03/15/2017   ??? 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       Changes to allergies: No    SPECIALTY MEDICATION ADHERENCE     Mycophenolate 180mg   : 10 days of medicine on hand   Tacrolimus 0.5mg   : 10 days of medicine on hand       Medication Adherence    Patient reported X missed doses in the last month: 0  Specialty Medication: tacrolimus 0.5mg   Patient is on additional specialty medications: Yes  Additional Specialty Medications: Mycophenolate 180mg   Patient Reported Additional Medication X Missed Doses in the Last Month: 0  Adherence tools used: patient uses a pill box to manage medications          Specialty medication(s) dose(s) confirmed: Regimen is correct and unchanged.     Are there any concerns with adherence? No    Adherence counseling provided? Not needed    CLINICAL MANAGEMENT AND INTERVENTION      Clinical Benefit Assessment:    Do you feel the medicine is effective or helping your condition? Yes    Clinical Benefit counseling provided? Not needed    Adverse Effects Assessment:    Are you experiencing any side effects? No    Are you experiencing difficulty administering your medicine? No    Quality of Life Assessment:         How many days over the past month did your transplant  keep you from your normal activities? For example, brushing your teeth or getting up in the morning. 0    Have you discussed this with your provider? Not needed    Acute Infection Status:    Acute infections noted within Epic:  No active infections  Patient reported infection: None    Therapy Appropriateness:    Is therapy appropriate and patient progressing towards therapeutic  goals? Yes, therapy is appropriate and should be continued    DISEASE/MEDICATION-SPECIFIC INFORMATION      N/A    PATIENT SPECIFIC NEEDS     - Does the patient have any physical, cognitive, or cultural barriers? No    - Is the patient high risk? Yes, patient is taking a REMS drug. Medication is dispensed in compliance with REMS program    - Does the patient require a Care Management Plan? No       SHIPPING     Specialty Medication(s) to be Shipped:   Transplant:  mycophenolic acid 180mg  and tacrolimus 0.5mg     Other medication(s) to be shipped: magox   Pt denied all other refills today     Changes to insurance: No    Delivery Scheduled: Yes, Expected medication delivery date: 02/11/2022.     Medication will be delivered via UPS to the confirmed prescription address in [facility].    The patient will receive a drug information handout for each medication shipped and additional FDA Medication Guides as required.  Verified that patient has previously received a Conservation officer, historic buildings and a Surveyor, mining.    The patient or caregiver noted above participated in the development of this care plan and knows that they can request review of or adjustments to the care plan at any time.      All of the patient's questions and concerns have been addressed.    Thad Ranger   [facility] Specialty Pharmacist

## 2022-02-08 MED FILL — TACROLIMUS 0.5 MG CAPSULE, IMMEDIATE-RELEASE: ORAL | 30 days supply | Qty: 180 | Fill #11

## 2022-02-08 MED FILL — MAGNESIUM OXIDE 400 MG (241.3 MG MAGNESIUM) TABLET: ORAL | 30 days supply | Qty: 60 | Fill #11

## 2022-02-08 MED FILL — MYCOPHENOLATE SODIUM 180 MG TABLET,DELAYED RELEASE: ORAL | 30 days supply | Qty: 180 | Fill #10

## 2022-02-26 NOTE — Unmapped (Signed)
I called pt and left a message to schedule an appt with kidney tranasplant clinic. She has not been seen in person since 03/2020 and 02/2021 was a virtual visit. I asked her call back so that we can schedule an appt as well as to continue prescribing her medications.

## 2022-03-08 DIAGNOSIS — Z940 Kidney transplant status: Principal | ICD-10-CM

## 2022-03-08 MED ORDER — TACROLIMUS 0.5 MG CAPSULE, IMMEDIATE-RELEASE
ORAL_CAPSULE | Freq: Two times a day (BID) | ORAL | 11 refills | 30 days
Start: 2022-03-08 — End: ?

## 2022-03-08 MED ORDER — MAGNESIUM OXIDE 400 MG (241.3 MG MAGNESIUM) TABLET
ORAL_TABLET | Freq: Two times a day (BID) | ORAL | 11 refills | 30 days
Start: 2022-03-08 — End: ?

## 2022-03-08 MED ORDER — CHLORTHALIDONE 25 MG TABLET
ORAL_TABLET | Freq: Every morning | ORAL | 11 refills | 30 days
Start: 2022-03-08 — End: ?

## 2022-03-08 NOTE — Unmapped (Signed)
[facility] Specialty Pharmacy Refill Coordination Note    Specialty Medication(s) to be Shipped:   Transplant: mycophenolate mofetil 180mg  and tacrolimus 0.5mg     Other medication(s) to be shipped:  chlorthalidone,cinacalcet,magnesium      Lynn Erickson, DOB: 06/18/1966  Phone: [phone] (home)       All above HIPAA information was verified with patient.     Was a Nurse, learning disability used for this call? No    Completed refill call assessment today to schedule patient's medication shipment from the [facility] [phone]).  All relevant notes have been reviewed.     Specialty medication(s) and dose(s) confirmed: Regimen is correct and unchanged.   Changes to medications: [name] reports no changes at this time.  Changes to insurance: No  New side effects reported not previously addressed with a pharmacist or physician: None reported  Questions for the pharmacist: No    Confirmed patient received a Conservation officer, historic buildings and a Surveyor, mining with first shipment. The patient will receive a drug information handout for each medication shipped and additional FDA Medication Guides as required.       DISEASE/MEDICATION-SPECIFIC INFORMATION        N/A    SPECIALTY MEDICATION ADHERENCE     Medication Adherence    Patient reported X missed doses in the last month: 0  Specialty Medication: mycophenolate 180 mg  Patient is on additional specialty medications: Yes  Additional Specialty Medications: Tacrolimus 0.5 mg   Patient Reported Additional Medication X Missed Doses in the Last Month: 0  Patient is on more than two specialty medications: Yes  Specialty Medication: Cinacalcet 30mg   Patient Reported Additional Medication X Missed Doses in the Last Month: 0  Informant: patient      Adherence tools used: patient uses a pill box to manage medications                 Were doses missed due to medication being on hold? No    Tacrolimus 0.5 mg: 8 days of medicine on hand   Mycophenolate 180  mg: 8 days of medicine on hand         REFERRAL TO PHARMACIST     Referral to the pharmacist: Not needed      [facility]     Shipping address confirmed in Epic.     Delivery Scheduled: Yes, Expected medication delivery date: 03/14/22.  However, Rx request for refills was sent to the provider as there are none remaining.     Medication will be delivered via UPS to the prescription address in Epic [place].    Wyatt Mage M Elisabeth Cara   [facility] Specialty Technician

## 2022-03-12 MED ORDER — TACROLIMUS 0.5 MG CAPSULE, IMMEDIATE-RELEASE
ORAL_CAPSULE | Freq: Two times a day (BID) | ORAL | 11 refills | 30 days | Status: CP
Start: 2022-03-12 — End: ?
  Filled 2022-03-13: qty 180, 30d supply, fill #0

## 2022-03-12 MED ORDER — MAGNESIUM OXIDE 400 MG (241.3 MG MAGNESIUM) TABLET
ORAL_TABLET | Freq: Two times a day (BID) | ORAL | 11 refills | 30 days | Status: CP
Start: 2022-03-12 — End: ?
  Filled 2022-03-13: qty 60, 30d supply, fill #0

## 2022-03-12 MED ORDER — CHLORTHALIDONE 25 MG TABLET
ORAL_TABLET | Freq: Every morning | ORAL | 11 refills | 30 days | Status: CP
Start: 2022-03-12 — End: ?
  Filled 2022-03-13: qty 30, 30d supply, fill #0

## 2022-03-13 MED FILL — CINACALCET 30 MG TABLET: ORAL | 90 days supply | Qty: 90 | Fill #2

## 2022-03-13 MED FILL — MYCOPHENOLATE SODIUM 180 MG TABLET,DELAYED RELEASE: ORAL | 30 days supply | Qty: 180 | Fill #11

## 2022-03-14 ENCOUNTER — Ambulatory Visit: Admit: 2022-03-14 | Discharge: 2022-03-15 | Payer: MEDICAID

## 2022-03-14 LAB — URINALYSIS WITH MICROSCOPY
BACTERIA: NONE SEEN /HPF
BILIRUBIN UA: NEGATIVE
BLOOD UA: NEGATIVE
GLUCOSE UA: NEGATIVE
KETONES UA: NEGATIVE
LEUKOCYTE ESTERASE UA: NEGATIVE
NITRITE UA: NEGATIVE
PH UA: 5 (ref 5.0–9.0)
PROTEIN UA: NEGATIVE
RBC UA: <1 /HPF (ref <=4)
SPECIFIC GRAVITY UA: 1.015 (ref 1.003–1.030)
SQUAMOUS EPITHELIAL: <1 /HPF (ref 0–5)
UROBILINOGEN UA: <2
WBC UA: 1 /HPF (ref 0–5)

## 2022-03-14 LAB — PROTEIN / CREATININE RATIO, URINE
CREATININE, URINE: 85.8 mg/dL
PROTEIN URINE: 10.3 mg/dL
PROTEIN/CREAT RATIO, URINE: 0.12

## 2022-03-14 LAB — CBC W/ AUTO DIFF
BASOPHILS ABSOLUTE COUNT: 0 10*9/L (ref 0.0–0.1)
BASOPHILS RELATIVE PERCENT: 0.7 %
EOSINOPHILS ABSOLUTE COUNT: 0.3 10*9/L (ref 0.0–0.5)
EOSINOPHILS RELATIVE PERCENT: 5.8 %
HEMATOCRIT: 36.8 % (ref 34.0–44.0)
HEMOGLOBIN: 11.5 g/dL (ref 11.3–14.9)
LYMPHOCYTES ABSOLUTE COUNT: 1.3 10*9/L (ref 1.1–3.6)
LYMPHOCYTES RELATIVE PERCENT: 27.7 %
MEAN CORPUSCULAR HEMOGLOBIN CONC: 31.3 g/dL — ABNORMAL LOW (ref 32.0–36.0)
MEAN CORPUSCULAR HEMOGLOBIN: 19 pg — ABNORMAL LOW (ref 25.9–32.4)
MEAN CORPUSCULAR VOLUME: 60.8 fL — ABNORMAL LOW (ref 77.6–95.7)
MEAN PLATELET VOLUME: 9 fL (ref 6.8–10.7)
MONOCYTES ABSOLUTE COUNT: 0.5 10*9/L (ref 0.3–0.8)
MONOCYTES RELATIVE PERCENT: 10 %
NEUTROPHILS ABSOLUTE COUNT: 2.6 10*9/L (ref 1.8–7.8)
NEUTROPHILS RELATIVE PERCENT: 55.8 %
NUCLEATED RED BLOOD CELLS: 0 /100{WBCs} (ref <=4)
PLATELET COUNT: 181 10*9/L (ref 150–450)
RED BLOOD CELL COUNT: 6.05 10*12/L — ABNORMAL HIGH (ref 3.95–5.13)
RED CELL DISTRIBUTION WIDTH: 16.5 % — ABNORMAL HIGH (ref 12.2–15.2)
WBC ADJUSTED: 4.7 10*9/L (ref 3.6–11.2)

## 2022-03-14 LAB — BASIC METABOLIC PANEL
ANION GAP: 10 mmol/L (ref 5–14)
BLOOD UREA NITROGEN: 22 mg/dL (ref 9–23)
BUN / CREAT RATIO: 24
CALCIUM: 10 mg/dL (ref 8.7–10.4)
CHLORIDE: 107 mmol/L (ref 98–107)
CO2: 22.9 mmol/L (ref 20.0–31.0)
CREATININE: 0.93 mg/dL — ABNORMAL HIGH
EGFR CKD-EPI (2021) FEMALE: 72 mL/min/{1.73_m2} (ref >=60)
GLUCOSE RANDOM: 97 mg/dL (ref 70–179)
POTASSIUM: 4.4 mmol/L (ref 3.4–4.8)
SODIUM: 140 mmol/L (ref 135–145)

## 2022-03-14 LAB — ALBUMIN / CREATININE URINE RATIO
ALBUMIN QUANT URINE: 2.2 mg/dL
ALBUMIN/CREATININE RATIO: 25.4 ug/mg (ref 0.0–30.0)
CREATININE, URINE: 86.7 mg/dL

## 2022-03-14 LAB — MAGNESIUM: MAGNESIUM: 1.5 mg/dL — ABNORMAL LOW (ref 1.6–2.6)

## 2022-03-14 LAB — PHOSPHORUS: PHOSPHORUS: 3.7 mg/dL (ref 2.4–5.1)

## 2022-03-14 NOTE — Unmapped (Signed)
Returned patient's call regarding question about having labs drawn today vs tomorrow before her appt. Let pt know she could have them drawn today at a [facility] lab and to ensure the tac level was a good trough.

## 2022-03-14 NOTE — Unmapped (Signed)
Patient paged asking if she can have labs drawn locally prior to appt on Friday with Dr Toni Arthurs. Message sent to primary TNC.

## 2022-03-15 ENCOUNTER — Ambulatory Visit: Admit: 2022-03-15 | Payer: MEDICAID | Attending: Nephrology | Primary: Nephrology

## 2022-03-15 LAB — TACROLIMUS LEVEL, TROUGH: TACROLIMUS, TROUGH: 6.8 ng/mL (ref 5.0–15.0)

## 2022-03-24 NOTE — Unmapped (Signed)
[employer] transplant nephrology clinic visit    assessment and plan  1. s/p kidney transplant 10/12/2016. baseline creatinine 0.7-1 mg/dl. no proteinuria. potential false positive donor specific hla ab detected '21.   2. immunosuppression. mycophenolate 540mg  bid. tacrolimus 12hr lvl 5-8 ng/ml.  3. hypertension. blood pressure monitoring strongly encouraged. blood pressure goal < 130/80 mmhg.   4. preventive medicine. influenza '19. pcv13 pneumococcal '17. ppsv23 pneumococcal '19. zoster '19. mrna covid-19 vaccine recommended. mammogram '19. gyn exam/pap smear '19. kidney ultrasound '22. colonoscopy '16 with 5 year follow up recommended.    history of present illness    [name] is a 56 year old woman who is seen in follow up post kidney transplant 10/12/2016. she feels ok. +arthralgias bilateral hands. she stopped chlorthalidone months ago d/t normal blood pressure readings but has not recently monitored blood pressure.     past medical hx:  1. 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 baseline creatinine 0.7-1 mg/dl.  2. systemic lupus erythematosus   3. hypertension  4. secondary/tertiary hyperparathyroidism  5. beta thalassemia trait    past surgical hx: left upper ext av graft x2 '07-08. bilateral tubal ligation. kidney transplant '18. left upper ext avg ligation '19.     allergies: penicillin vancomycin aspirin shellfish hydroxychloroquine.     medications: tacrolimus 1.5mg  bid, mycophenolate 540mg  bid, cinacalcet 30mg  daily, mg oxide 400mg  bid.    soc hx: widowed x4 children. no smoking hx     The patient reports they are currently: at home. I spent 25 minutes on the phone with the patient on the date of service. I spent an additional 15 minutes on pre- and post-visit activities on the date of service.     The patient was physically located in [place] or a state in which I am permitted to provide care. The patient and/or parent/guardian understood that s/he may incur co-pays and cost sharing, and agreed to the telemedicine visit. The visit was reasonable and appropriate under the circumstances given the patient's presentation at the time.    The patient and/or parent/guardian has been advised of the potential risks and limitations of this mode of treatment (including, but not limited to, the absence of in-person examination) and has agreed to be treated using telemedicine. The patient's/patient's family's questions regarding telemedicine have been answered.     If the visit was completed in an ambulatory setting, the patient and/or parent/guardian has also been advised to contact their provider???s office for worsening conditions, and seek emergency medical treatment and/or call 911 if the patient deems either necessary.

## 2022-04-12 DIAGNOSIS — Z940 Kidney transplant status: Principal | ICD-10-CM

## 2022-04-12 MED ORDER — MYCOPHENOLATE SODIUM 180 MG TABLET,DELAYED RELEASE
ORAL_TABLET | Freq: Two times a day (BID) | ORAL | 11 refills | 30 days | Status: CP
Start: 2022-04-12 — End: 2023-04-12
  Filled 2022-04-15: qty 180, 30d supply, fill #0

## 2022-04-12 NOTE — Unmapped (Signed)
Pt request for RX Refill

## 2022-04-12 NOTE — Unmapped (Signed)
[facility] Refill Coordination Note    Specialty Medication(s) to be Shipped:   Transplant:  mycophenolic acid 180mg  and tacrolimus 0.5mg     Other medication(s) to be shipped:  magnesium      Lynn Erickson, DOB: 03-Apr-1966  Phone: [phone] (home)       All above HIPAA information was verified with patient.     Was a Nurse, learning disability used for this call? No    Completed refill call assessment today to schedule patient's medication shipment from the [facility] [phone]).  All relevant notes have been reviewed.     Specialty medication(s) and dose(s) confirmed: Regimen is correct and unchanged.   Changes to medications: [name] reports no changes at this time.  Changes to insurance: No  New side effects reported not previously addressed with a pharmacist or physician: None reported  Questions for the pharmacist: No    Confirmed patient received a Conservation officer, historic buildings and a Surveyor, mining with first shipment. The patient will receive a drug information handout for each medication shipped and additional FDA Medication Guides as required.       DISEASE/MEDICATION-SPECIFIC INFORMATION        N/A    SPECIALTY MEDICATION ADHERENCE     Medication Adherence    Patient reported X missed doses in the last month: 0  Specialty Medication: mycophenolate 180 mg  Patient is on additional specialty medications: Yes  Additional Specialty Medications: Tacrolimus 0.5 mg   Patient Reported Additional Medication X Missed Doses in the Last Month: 0  Patient is on more than two specialty medications: No      Adherence tools used: patient uses a pill box to manage medications                          Were doses missed due to medication being on hold? No    mycophenolate 180 mg: 14 days of medicine on hand   tacrolimus 0.5 mg: 14 days of medicine on hand       REFERRAL TO PHARMACIST     Referral to the pharmacist: Not needed      [facility]     Shipping address confirmed in Epic.     Delivery Scheduled: Yes, Expected medication delivery date: 04/16/22.     Medication will be delivered via UPS to the prescription address in Epic WAM.    Ernestine Mcmurray   [facility] Specialty Technician

## 2022-04-15 MED FILL — TACROLIMUS 0.5 MG CAPSULE, IMMEDIATE-RELEASE: ORAL | 30 days supply | Qty: 180 | Fill #1

## 2022-04-15 MED FILL — MAGNESIUM OXIDE 400 MG (241.3 MG MAGNESIUM) TABLET: ORAL | 30 days supply | Qty: 60 | Fill #1

## 2022-05-08 NOTE — Unmapped (Signed)
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     Was a Nurse, learning disability used for this call? No    Completed refill call assessment today to schedule patient's medication shipment from the [facility] [phone]).  All relevant notes have been reviewed.     Specialty medication(s) and dose(s) confirmed: Regimen is correct and unchanged.   Changes to medications: Lynn Erickson reports no changes at this time.  Changes to insurance: No  New side effects reported not previously addressed with a pharmacist or physician: None reported  Questions for the pharmacist: No    Confirmed patient received a Conservation officer, historic buildings and a Surveyor, mining with first shipment. The patient will receive a drug information handout for each medication shipped and additional FDA Medication Guides as required.       DISEASE/MEDICATION-SPECIFIC INFORMATION        N/A    SPECIALTY MEDICATION ADHERENCE     Medication Adherence    Patient reported X missed doses in the last month: 0  Specialty Medication: tacrolimus 0.5 MG capsule (PROGRAF)  Patient is on additional specialty medications: Yes  Additional Specialty Medications: mycophenolate 180 MG EC tablet (MYFORTIC)  Patient Reported Additional Medication X Missed Doses in the Last Month: 0  Patient is on more than two specialty medications: No      Adherence tools used: patient uses a pill box to manage medications                          Were doses missed due to medication being on hold? No    mycophenolate 180 mg: 5 days of medicine on hand   tacrolimus 0.5 mg: 5 days of medicine on hand       REFERRAL TO PHARMACIST     Referral to the pharmacist: Not needed      [facility] Shipping address confirmed in Epic.     Delivery Scheduled: Yes, Expected medication delivery date: 05/10/22.     Medication will be delivered via UPS to the prescription address in Epic WAM.    [place] A Ravindra Baranek   [facility] Shared [facility] Specialty Technician

## 2022-05-09 MED FILL — MAGNESIUM OXIDE 400 MG (241.3 MG MAGNESIUM) TABLET: ORAL | 30 days supply | Qty: 60 | Fill #2

## 2022-05-09 MED FILL — MYCOPHENOLATE SODIUM 180 MG TABLET,DELAYED RELEASE: ORAL | 30 days supply | Qty: 180 | Fill #1

## 2022-05-09 MED FILL — CHLORTHALIDONE 25 MG TABLET: ORAL | 30 days supply | Qty: 30 | Fill #1

## 2022-05-09 MED FILL — TACROLIMUS 0.5 MG CAPSULE, IMMEDIATE-RELEASE: ORAL | 30 days supply | Qty: 180 | Fill #2

## 2022-06-11 NOTE — Unmapped (Signed)
[facility] Pharm[facility] Refill Coordination Note    Specialty Medication(s) to be Shipped:   Transplant:  mycophenolic acid 180mg  and tacrolimus 0.5mg     Other medication(s) to be shipped:  magnesium,, chlorthalidone, sensipar     Lynn Erickson, DOB: 1965-11-11  Phone: [phone] (home)       All above HIPAA information was verified with patient.     Was a Nurse, learning disability used for this call? No    Completed refill call assessment today to schedule patient's medication shipment from the [facility] [phone]).  All relevant notes have been reviewed.     Specialty medication(s) and dose(s) confirmed: Regimen is correct and unchanged.   Changes to medications: Jaquelin reports no changes at this time.  Changes to insurance: No  New side effects reported not previously addressed with a pharmacist or physician: None reported  Questions for the pharmacist: No    Confirmed patient received a Conservation officer, historic buildings and a Surveyor, mining with first shipment. The patient will receive a drug information handout for each medication shipped and additional FDA Medication Guides as required.       DISEASE/MEDICATION-SPECIFIC INFORMATION        N/A    SPECIALTY MEDICATION ADHERENCE     Medication Adherence    Patient reported X missed doses in the last month: 0  Specialty Medication: tacrolimus 0.5 MG capsule (PROGRAF)  Patient is on additional specialty medications: Yes  Additional Specialty Medications: mycophenolate 180 MG EC tablet (MYFORTIC)  Patient Reported Additional Medication X Missed Doses in the Last Month: 0  Patient is on more than two specialty medications: No      Adherence tools used: patient uses a pill box to manage medications                          Were doses missed due to medication being on hold? No    mycophenolate 180 mg: 5 days of medicine on hand   tacrolimus 0.5 mg: 5 days of medicine on hand       REFERRAL TO PHARMACIST     Referral to the pharmacist: Not needed      [facility]     Shipping address confirmed in Epic.     Delivery Scheduled: Yes, Expected medication delivery date: 06/14/22.     Medication will be delivered via UPS to the prescription address in Epic WAM.    Ernestine Mcmurray   [facility] Shared [facility] Specialty Technician

## 2022-06-13 MED FILL — TACROLIMUS 0.5 MG CAPSULE, IMMEDIATE-RELEASE: ORAL | 30 days supply | Qty: 180 | Fill #3

## 2022-06-13 MED FILL — CHLORTHALIDONE 25 MG TABLET: ORAL | 30 days supply | Qty: 30 | Fill #2

## 2022-06-13 MED FILL — MYCOPHENOLATE SODIUM 180 MG TABLET,DELAYED RELEASE: ORAL | 30 days supply | Qty: 180 | Fill #2

## 2022-06-13 MED FILL — CINACALCET 30 MG TABLET: ORAL | 90 days supply | Qty: 90 | Fill #3

## 2022-06-13 MED FILL — MAGNESIUM OXIDE 400 MG (241.3 MG MAGNESIUM) TABLET: ORAL | 30 days supply | Qty: 60 | Fill #3

## 2022-07-05 NOTE — Unmapped (Signed)
[facility] Pharm[facility] Refill Coordination Note    Specialty Medication(s) to be Shipped:   Transplant: mycophenolate mofetil 180 mg and tacrolimus 0.5mg     Other medication(s) to be shipped:  chlorthalidone 2mg , mag ox 400mg      Lynn Erickson, DOB: [birth year]  Phone: [phone] (home)       All above HIPAA information was verified with patient.     Was a Nurse, learning disability used for this call? No    Completed refill call assessment today to schedule patient's medication shipment from the [facility] [phone]).  All relevant notes have been reviewed.     Specialty medication(s) and dose(s) confirmed: Regimen is correct and unchanged.   Changes to medications: Lynn Erickson reports no changes at this time.  Changes to insurance: No  New side effects reported not previously addressed with a pharmacist or physician: None reported  Questions for the pharmacist: No    Confirmed patient received a Conservation officer, historic buildings and a Surveyor, mining with first shipment. The patient will receive a drug information handout for each medication shipped and additional FDA Medication Guides as required.       DISEASE/MEDICATION-SPECIFIC INFORMATION        N/A    SPECIALTY MEDICATION ADHERENCE     Medication Adherence    Specialty Medication: tacrolimus 0.5 MG capsule (PROGRAF)      Adherence tools used: patient uses a pill box to manage medications                          Were doses missed due to medication being on hold? No    tacrolimus 0.5 MG capsule (PROGRAF) : 10 days of medicine on hand   mycophenolate 180 MG EC tablet (MYFORTIC)  : 10 days of medicine on hand   cinacalcet 30 MG tablet (SENSIPAR)  : 60 days of medicine on hand       REFERRAL TO PHARMACIST     Referral to the pharmacist: Not needed      [facility]     Shipping address confirmed in Epic.     Delivery Scheduled: Yes, Expected medication delivery date: 07/11/22.     Medication will be delivered via UPS to the prescription address in Epic WAM.    Lynn Erickson' W Lynn Erickson [facility] Specialty Technician

## 2022-07-10 MED FILL — CHLORTHALIDONE 25 MG TABLET: ORAL | 30 days supply | Qty: 30 | Fill #3

## 2022-07-10 MED FILL — MAGNESIUM OXIDE 400 MG (241.3 MG MAGNESIUM) TABLET: ORAL | 30 days supply | Qty: 60 | Fill #4

## 2022-07-10 MED FILL — TACROLIMUS 0.5 MG CAPSULE, IMMEDIATE-RELEASE: ORAL | 30 days supply | Qty: 180 | Fill #4

## 2022-07-10 MED FILL — MYCOPHENOLATE SODIUM 180 MG TABLET,DELAYED RELEASE: ORAL | 30 days supply | Qty: 180 | Fill #3

## 2022-08-07 NOTE — Unmapped (Signed)
[facility] Pharm[facility] Refill Coordination Note    Specialty Medication(s) to be Shipped:   Transplant: Myfortic 180mg  and tacrolimus 0.5mg     Other medication(s) to be shipped:  chlorthalidone, magnesium oxide     Lynn Erickson, DOB: 03/23/1966  Phone: [phone] (home)       All above HIPAA information was verified with patient.     Was a Nurse, learning disability used for this call? No    Completed refill call assessment today to schedule patient's medication shipment from the [facility] [phone]).  All relevant notes have been reviewed.     Specialty medication(s) and dose(s) confirmed: Regimen is correct and unchanged.   Changes to medications: [name] reports no changes at this time.  Changes to insurance: No  New side effects reported not previously addressed with a pharmacist or physician: None reported  Questions for the pharmacist: No    Confirmed patient received a Conservation officer, historic buildings and a Surveyor, mining with first shipment. The patient will receive a drug information handout for each medication shipped and additional FDA Medication Guides as required.       DISEASE/MEDICATION-SPECIFIC INFORMATION        N/A    SPECIALTY MEDICATION ADHERENCE     Medication Adherence    Patient reported X missed doses in the last month: 0  Specialty Medication: tacrolimus 0.5mg   Patient is on additional specialty medications: Yes  Additional Specialty Medications: Mycophenolate 180mg   Patient Reported Additional Medication X Missed Doses in the Last Month: 0  Patient is on more than two specialty medications: No      Adherence tools used: patient uses a pill box to manage medications                          Were doses missed due to medication being on hold? No    Mycophenolate 180 mg: 10 days of medicine on hand   Tacrolimus 0.5 mg: 10 days of medicine on hand       REFERRAL TO PHARMACIST     Referral to the pharmacist: Not needed      [facility]     Shipping address confirmed in Epic.     Delivery Scheduled: Yes, Expected medication delivery date: 08/09/2022.     Medication will be delivered via UPS to the prescription address in Epic WAM.    Lynn Erickson   [facility] Specialty Technician

## 2022-08-08 MED FILL — TACROLIMUS 0.5 MG CAPSULE, IMMEDIATE-RELEASE: ORAL | 30 days supply | Qty: 180 | Fill #5

## 2022-08-08 MED FILL — MAGNESIUM OXIDE 400 MG (241.3 MG MAGNESIUM) TABLET: ORAL | 30 days supply | Qty: 60 | Fill #5

## 2022-08-08 MED FILL — CHLORTHALIDONE 25 MG TABLET: ORAL | 30 days supply | Qty: 30 | Fill #4

## 2022-08-08 MED FILL — MYCOPHENOLATE SODIUM 180 MG TABLET,DELAYED RELEASE: ORAL | 30 days supply | Qty: 180 | Fill #4

## 2022-09-06 MED ORDER — CINACALCET 30 MG TABLET
ORAL_TABLET | Freq: Every day | ORAL | 3 refills | 90 days | Status: CP
Start: 2022-09-06 — End: 2023-09-06
  Filled 2022-09-10: qty 90, 90d supply, fill #0

## 2022-09-06 NOTE — Unmapped (Signed)
Pt request for RX Refill

## 2022-09-06 NOTE — Unmapped (Signed)
[facility] Pharmacy Clinical Assessment & Refill Coordination Note    Lynn Erickson, DOB: [birth year]  Phone: [phone] (home)     All above HIPAA information was verified with patient.     Was a Nurse, learning disability used for this call? No    Specialty Medication(s):   Transplant:  mycophenolic acid 180mg  and tacrolimus 0.5mg      Current Outpatient Medications   Medication Sig Dispense Refill    chlorthalidone (HYGROTON) 25 MG tablet Take 1 tablet (25 mg total) by mouth every morning. (Patient not taking: Reported on 03/15/2022) 30 tablet 11    cinacalcet (SENSIPAR) 30 MG tablet Take 1 tablet (30 mg total) by mouth daily. 90 tablet 3    magnesium oxide (MAG-OX) 400 mg (241.3 mg elemental magnesium) tablet Take 1 tablet (400 mg total) by mouth Two (2) times a day. 60 tablet 11    mycophenolate (MYFORTIC) 180 MG EC tablet Take 3 tablets (540 mg total) by mouth two (2) times a day. 180 tablet 11    tacrolimus (PROGRAF) 0.5 MG capsule Take 3 capsules (1.5 mg total) by mouth two (2) times a day 180 capsule 11     No current facility-administered medications for this visit.        Changes to medications: Lynn Erickson reports no changes at this time.    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       Changes to allergies: No    SPECIALTY MEDICATION ADHERENCE     Tacrolimus 0.5mg   : 9 days of medicine on hand   Mycophenolate 180mg   : 8 days of medicine on hand       Medication Adherence    Patient reported X missed doses in the last month: 0  Specialty Medication: tacrolimus 0.5mg   Patient is on additional specialty medications: Yes  Additional Specialty Medications: Mycophenolate 180mg   Patient Reported Additional Medication X Missed Doses in the Last Month: 0  Patient is on more than two specialty medications: No  Adherence tools used: patient uses a pill box to manage medications          Specialty medication(s) dose(s) confirmed: Regimen is correct and unchanged.     Are there any concerns with adherence? No    Adherence counseling provided? Not needed    CLINICAL MANAGEMENT AND INTERVENTION      Clinical Benefit Assessment:    Do you feel the medicine is effective or helping your condition? Yes    Clinical Benefit counseling provided? Not needed    Adverse Effects Assessment:    Are you experiencing any side effects? No    Are you experiencing difficulty administering your medicine? No    Quality of Life Assessment:    Quality of Life    Rheumatology  Oncology  Dermatology  Cystic Fibrosis          How many days over the past month did your transplant  keep you from your normal activities? For example, brushing your teeth or getting up in the morning. 0    Have you discussed this with your provider? Not needed    Acute Infection  Status:    Acute infections noted within Epic:  No active infections  Patient reported infection: None    Therapy Appropriateness:    Is therapy appropriate and patient progressing towards therapeutic goals? Yes, therapy is appropriate and should be continued    DISEASE/MEDICATION-SPECIFIC INFORMATION      N/A    Solid Organ Transplant: Not Applicable    PATIENT SPECIFIC NEEDS     Does the patient have any physical, cognitive, or cultural barriers? No    Is the patient high risk? Yes, patient is taking a REMS drug. Medication is dispensed in compliance with REMS program    Did the patient require a clinical intervention? No    Does the patient require physician intervention or other additional services (i.e., nutrition, smoking cessation, social work)? No    SOCIAL DETERMINANTS OF HEALTH     At the [facility], we have learned that life circumstances - like trouble affording food, housing, utilities, or transportation can affect the health of many of our patients.   That is why we wanted to ask: are you currently experiencing any life circumstances that are negatively impacting your health and/or quality of life? No    Social Determinants of Psychologist, prison and probation services Strain: Not on file   Internet Connectivity: Not on file   Food Insecurity: Not on file   Tobacco Use: Low Risk  (08/13/2021)    Patient History     Smoking Tobacco Use: Never     Smokeless Tobacco Use: Never     Passive Exposure: Not on file   Housing/Utilities: Not on file   Alcohol Use: Not on file   Transportation Needs: Unmet Transportation Needs (09/06/2021)    PRAPARE - Therapist, art (Medical): Yes     Lack of Transportation (Non-Medical): Yes   Substance Use: Not on file   Health Literacy: Not on file   Physical Activity: Not on file   Interpersonal Safety: Not on file   Stress: Not on file   Intimate Partner Violence: Not on file   Depression: Not on file   Social Connections: Not on file       Would you be willing to receive help with any of the needs that you have identified today? Not applicable       SHIPPING     Specialty Medication(s) to be Shipped:   Transplant:  mycophenolic acid 180mg  and tacrolimus 0.5mg     Other medication(s) to be shipped:  chlorthalidone, cinacalcet, magox     Changes to insurance: No    Patient was informed of new phone menu: Yes    Delivery Scheduled: Yes, Expected medication delivery date: 09/11/2022.  However, Rx request for refills was sent to the provider as there are none remaining.     Medication will be delivered via UPS to the confirmed prescription address in [facility].    The patient will receive a drug information handout for each medication shipped and additional FDA Medication Guides as required.  Verified that patient has previously received a Conservation officer, historic buildings and a Surveyor, mining.    The patient or caregiver noted above participated in the development of this care plan and knows that they can request review of or adjustments to the care plan at any time.      All of the patient's questions and concerns have been addressed.    Thad Ranger, PharmD   [facility]  Pharmacy Specialty Pharmacist

## 2022-09-10 MED FILL — MYCOPHENOLATE SODIUM 180 MG TABLET,DELAYED RELEASE: ORAL | 30 days supply | Qty: 180 | Fill #5

## 2022-09-10 MED FILL — TACROLIMUS 0.5 MG CAPSULE, IMMEDIATE-RELEASE: ORAL | 30 days supply | Qty: 180 | Fill #6

## 2022-09-10 MED FILL — MAGNESIUM OXIDE 400 MG (241.3 MG MAGNESIUM) TABLET: ORAL | 30 days supply | Qty: 60 | Fill #6

## 2022-09-10 MED FILL — CHLORTHALIDONE 25 MG TABLET: ORAL | 30 days supply | Qty: 30 | Fill #5

## 2022-09-13 NOTE — Unmapped (Signed)
Left detailed VM asking patient to get monthly labs and schedule follow-up with Dr. Toni Arthurs. Will send request to schedulers to contact patient.

## 2022-09-17 NOTE — Unmapped (Signed)
Left another msg for patient to return my call to schedule follow up w/ imaging

## 2022-10-09 NOTE — Unmapped (Signed)
[facility] Pharm[facility] Refill Coordination Note    Specialty Medication(s) to be Shipped:   Transplant: mycophenolate mofetil 180mg  and tacrolimus 0.5mg     Other medication(s) to be shipped: chlorthalidone 25 MG tablet (HYGROTON), magnesium oxide 400 mg (241.3 mg elemental) tablet (MAG-OX)     Lynn Erickson, DOB: 1965/12/06  Phone: [phone] (home)       All above HIPAA information was verified with patient.     Was a Nurse, learning disability used for this call? No    Completed refill call assessment today to schedule patient's medication shipment from the [facility] [phone]).  All relevant notes have been reviewed.     Specialty medication(s) and dose(s) confirmed: Regimen is correct and unchanged.   Changes to medications: Leniya reports no changes at this time.  Changes to insurance: No  New side effects reported not previously addressed with a pharmacist or physician: None reported  Questions for the pharmacist: No    Confirmed patient received a Conservation officer, historic buildings and a Surveyor, mining with first shipment. The patient will receive a drug information handout for each medication shipped and additional FDA Medication Guides as required.       DISEASE/MEDICATION-SPECIFIC INFORMATION        N/A    SPECIALTY MEDICATION ADHERENCE     Medication Adherence    Patient reported X missed doses in the last month: 0  Specialty Medication: mycophenolate 180 MG EC tablet (MYFORTIC)  Patient is on additional specialty medications: Yes  Additional Specialty Medications: tacrolimus 0.5 MG capsule (PROGRAF)  Patient Reported Additional Medication X Missed Doses in the Last Month: 0  Patient is on more than two specialty medications: No  Any gaps in refill history greater than 2 weeks in the last 3 months: no  Demonstrates understanding of importance of adherence: yes  Adherence tools used: patient uses a pill box to manage medications              Were doses missed due to medication being on hold? No    tacrolimus 0.5   mg: 7 days of medicine on hand   mycophenolate 180   mg: 7 days of medicine on hand        REFERRAL TO PHARMACIST     Referral to the pharmacist: Not needed      [facility]     Shipping address confirmed in Epic.     Patient was notified of new phone menu : No    Delivery Scheduled: Yes, Expected medication delivery date: 10/11/22.     Medication will be delivered via UPS to the prescription address in Epic WAM.    Moshe Salisbury   [facility] Pharm[facility] Specialty Technician

## 2022-10-10 MED FILL — MYCOPHENOLATE SODIUM 180 MG TABLET,DELAYED RELEASE: ORAL | 30 days supply | Qty: 180 | Fill #6

## 2022-10-10 MED FILL — TACROLIMUS 0.5 MG CAPSULE, IMMEDIATE-RELEASE: ORAL | 30 days supply | Qty: 180 | Fill #7

## 2022-10-10 MED FILL — MAGNESIUM OXIDE 400 MG (241.3 MG MAGNESIUM) TABLET: ORAL | 30 days supply | Qty: 60 | Fill #7

## 2022-10-10 MED FILL — CHLORTHALIDONE 25 MG TABLET: ORAL | 30 days supply | Qty: 30 | Fill #6

## 2022-11-01 NOTE — Unmapped (Signed)
[facility] Pharm[facility] Refill Coordination Note    Specialty Medication(s) to be Shipped:   Transplant: mycophenolate mofetil 180mg  and tacrolimus 0.5mg     Other medication(s) to be shipped:  chlorthalidone and mag-ox     Lynn Erickson, DOB: 1966/05/21  Phone: [phone] (home)       All above HIPAA information was verified with patient.     Was a Nurse, learning disability used for this call? No    Completed refill call assessment today to schedule patient's medication shipment from the [facility] [phone]).  All relevant notes have been reviewed.     Specialty medication(s) and dose(s) confirmed: Regimen is correct and unchanged.   Changes to medications: Natassia reports no changes at this time.  Changes to insurance: No  New side effects reported not previously addressed with a pharmacist or physician: None reported  Questions for the pharmacist: No    Confirmed patient received a Conservation officer, historic buildings and a Surveyor, mining with first shipment. The patient will receive a drug information handout for each medication shipped and additional FDA Medication Guides as required.       DISEASE/MEDICATION-SPECIFIC INFORMATION        N/A    SPECIALTY MEDICATION ADHERENCE     Medication Adherence    Patient reported X missed doses in the last month: 0  Specialty Medication: mycophenolate 180 MG EC tablet (MYFORTIC)  Patient is on additional specialty medications: Yes  Additional Specialty Medications: tacrolimus 0.5 MG capsule (PROGRAF)  Patient Reported Additional Medication X Missed Doses in the Last Month: 0  Patient is on more than two specialty medications: No  Adherence tools used: patient uses a pill box to manage medications              Were doses missed due to medication being on hold? No    mycophenolate 180 mg: 10 days of medicine on hand   tacrolimus 0.5 mg: 10 days of medicine on hand     REFERRAL TO PHARMACIST     Referral to the pharmacist: Not needed      [facility]     Shipping address confirmed in Epic.       Delivery Scheduled: Yes, Expected medication delivery date: 11/07/22.     Medication will be delivered via UPS to the prescription address in Epic WAM.    Lynn Erickson   [facility] Specialty Technician

## 2022-11-07 MED FILL — MYCOPHENOLATE SODIUM 180 MG TABLET,DELAYED RELEASE: ORAL | 30 days supply | Qty: 180 | Fill #7

## 2022-11-07 MED FILL — CHLORTHALIDONE 25 MG TABLET: ORAL | 30 days supply | Qty: 30 | Fill #7

## 2022-11-07 MED FILL — TACROLIMUS 0.5 MG CAPSULE, IMMEDIATE-RELEASE: ORAL | 30 days supply | Qty: 180 | Fill #8

## 2022-11-07 MED FILL — MAGNESIUM OXIDE 400 MG (241.3 MG MAGNESIUM) TABLET: ORAL | 30 days supply | Qty: 60 | Fill #8

## 2022-11-26 NOTE — Unmapped (Signed)
Lynn Erickson declined the next shipment of Tacrolimus & Mycophenolate due to having a week supply on hand for both. The patient has requested a call back in a week to refill those medications, she only requested to fill Mag Ox at this time. I will adjust the refill call.

## 2022-11-28 MED FILL — MAGNESIUM OXIDE 400 MG (241.3 MG MAGNESIUM) TABLET: ORAL | 30 days supply | Qty: 60 | Fill #9

## 2022-12-05 NOTE — Unmapped (Signed)
[facility] Refill Coordination Note    Specialty Medication(s) to be Shipped:   Transplant:  mycophenolic acid 180mg  and tacrolimus 0.5mg     Other medication(s) to be shipped:  chlorthalidone, cinacalcet     Lynn Erickson, DOB: Nov 25, 1965  Phone: [phone] (home)       All above HIPAA information was verified with patient.     Was a Nurse, learning disability used for this call? No    Completed refill call assessment today to schedule patient's medication shipment from the [facility] [phone]).  All relevant notes have been reviewed.     Specialty medication(s) and dose(s) confirmed: Regimen is correct and unchanged.   Changes to medications: Anila reports no changes at this time.  Changes to insurance: No  New side effects reported not previously addressed with a pharmacist or physician: None reported  Questions for the pharmacist: No    Confirmed patient received a Conservation officer, historic buildings and a Surveyor, mining with first shipment. The patient will receive a drug information handout for each medication shipped and additional FDA Medication Guides as required.       DISEASE/MEDICATION-SPECIFIC INFORMATION        N/A    SPECIALTY MEDICATION ADHERENCE     Medication Adherence    Patient reported X missed doses in the last month: 0  Specialty Medication: mycophenolate 180mg   Patient is on additional specialty medications: Yes  Additional Specialty Medications: Tacrolimus 0.5mg   Patient Reported Additional Medication X Missed Doses in the Last Month: 0  Patient is on more than two specialty medications: No  Adherence tools used: patient uses a pill box to manage medications              Were doses missed due to medication being on hold? No    Mycophenolate 180mg   : 10 days of medicine on hand   Tacrolimus 0.5mg   : 10 days of medicine on hand     REFERRAL TO PHARMACIST     Referral to the pharmacist: Not needed      [facility]     Shipping address confirmed in Epic.       Delivery Scheduled: Yes, Expected medication delivery date: 12/11/2022.     Medication will be delivered via UPS to the prescription address in Epic WAM.    Thad Ranger, PharmD   [facility] Specialty Pharmacist

## 2022-12-10 MED FILL — TACROLIMUS 0.5 MG CAPSULE, IMMEDIATE-RELEASE: ORAL | 30 days supply | Qty: 180 | Fill #9

## 2022-12-10 MED FILL — MYCOPHENOLATE SODIUM 180 MG TABLET,DELAYED RELEASE: ORAL | 30 days supply | Qty: 180 | Fill #8

## 2022-12-10 MED FILL — CHLORTHALIDONE 25 MG TABLET: ORAL | 30 days supply | Qty: 30 | Fill #8

## 2022-12-10 MED FILL — CINACALCET 30 MG TABLET: ORAL | 90 days supply | Qty: 90 | Fill #1

## 2023-01-15 MED FILL — MAGNESIUM OXIDE 400 MG (241.3 MG MAGNESIUM) TABLET: ORAL | 30 days supply | Qty: 60 | Fill #10

## 2023-01-23 NOTE — Unmapped (Signed)
[facility] Refill Coordination Note    Specialty Medication(s) to be Shipped:   Transplant:  mycophenolic acid 180mg  and tacrolimus 1mg     Other medication(s) to be shipped: No additional medications requested for fill at this time     Lynn Erickson, DOB: [birth year]  Phone: [phone] (home)       All above HIPAA information was verified with patient.     Was a Nurse, learning disability used for this call? No    Completed refill call assessment today to schedule patient's medication shipment from the [facility] [phone]).  All relevant notes have been reviewed.     Specialty medication(s) and dose(s) confirmed: Regimen is correct and unchanged.   Changes to medications: Jaylia reports no changes at this time.  Changes to insurance: No  New side effects reported not previously addressed with a pharmacist or physician: None reported  Questions for the pharmacist: No    Confirmed patient received a Conservation officer, historic buildings and a Surveyor, mining with first shipment. The patient will receive a drug information handout for each medication shipped and additional FDA Medication Guides as required.       DISEASE/MEDICATION-SPECIFIC INFORMATION        N/A    SPECIALTY MEDICATION ADHERENCE     Medication Adherence    Patient reported X missed doses in the last month: 0  Specialty Medication: tacrolimus 0.5mg   Patient is on additional specialty medications: Yes  Additional Specialty Medications: Mycophenolate 180mg   Patient Reported Additional Medication X Missed Doses in the Last Month: 0  Adherence tools used: patient uses a pill box to manage medications              Were doses missed due to medication being on hold? No    Tacrolimus 0.5mg   : 7 days of medicine on hand   Mycophenolate 180mg   : 7 days of medicine on hand       REFERRAL TO PHARMACIST     Referral to the pharmacist: Not needed      [facility]     Shipping address confirmed in Epic.       Delivery Scheduled: Yes, Expected medication delivery date: 7/23.     Medication will be delivered via Next Day Courier to the prescription address in Epic WAM.    Westley Gambles   [facility] Specialty Technician

## 2023-01-27 MED FILL — TACROLIMUS 0.5 MG CAPSULE, IMMEDIATE-RELEASE: ORAL | 30 days supply | Qty: 180 | Fill #10

## 2023-01-27 MED FILL — MYCOPHENOLATE SODIUM 180 MG TABLET,DELAYED RELEASE: ORAL | 30 days supply | Qty: 180 | Fill #9

## 2023-02-17 NOTE — Unmapped (Signed)
[facility] Refill Coordination Note    Specialty Medication(s) to be Shipped:   Transplant: mycophenolate mofetil 180mg  and tacrolimus 0.5mg     Other medication(s) to be shipped:  magnesium      Lynn Erickson, DOB: Jun 10, 1966  Phone: [phone] (home)       All above HIPAA information was verified with patient.     Was a Nurse, learning disability used for this call? No    Completed refill call assessment today to schedule patient's medication shipment from the [facility] [phone]).  All relevant notes have been reviewed.     Specialty medication(s) and dose(s) confirmed: Regimen is correct and unchanged.   Changes to medications: Lakiya reports no changes at this time.  Changes to insurance: No  New side effects reported not previously addressed with a pharmacist or physician: None reported  Questions for the pharmacist: No    Confirmed patient received a Conservation officer, historic buildings and a Surveyor, mining with first shipment. The patient will receive a drug information handout for each medication shipped and additional FDA Medication Guides as required.       DISEASE/MEDICATION-SPECIFIC INFORMATION        N/A    SPECIALTY MEDICATION ADHERENCE     Medication Adherence    Patient reported X missed doses in the last month: 0  Specialty Medication: mycophenolate 180 MG EC tablet (MYFORTIC)  Patient is on additional specialty medications: Yes  Additional Specialty Medications: tacrolimus 0.5 MG capsule (PROGRAF)  Patient Reported Additional Medication X Missed Doses in the Last Month: 0  Patient is on more than two specialty medications: No  Any gaps in refill history greater than 2 weeks in the last 3 months: no  Demonstrates understanding of importance of adherence: yes  Informant: patient  Reliability of informant: reliable  Provider-estimated medication adherence level: good  Patient is at risk for Non-Adherence: No  Reasons for non-adherence: no problems identified  Adherence tools used: patient uses a pill box to manage medications  Confirmed plan for next specialty medication refill: delivery by pharmacy  Refills needed for supportive medications: not needed          Refill Coordination    Has the Patients' Contact Information Changed: No  Is the Shipping Address Different: No         Were doses missed due to medication being on hold? No    Mycophenolate  180 mg: 9 days of medicine on hand   Tacrolimus  0.5 mg: 9 days of medicine on hand       REFERRAL TO PHARMACIST     Referral to the pharmacist: Not needed      [facility]     Shipping address confirmed in Epic.       Delivery Scheduled: Yes, Expected medication delivery date: 08/14.     Medication will be delivered via Same Day Courier to the prescription address in Epic WAM.    Antonietta Barcelona   [facility] Specialty Technician

## 2023-02-19 MED FILL — MYCOPHENOLATE SODIUM 180 MG TABLET,DELAYED RELEASE: ORAL | 30 days supply | Qty: 180 | Fill #10

## 2023-02-19 MED FILL — MAGNESIUM OXIDE 400 MG (241.3 MG MAGNESIUM) TABLET: ORAL | 30 days supply | Qty: 60 | Fill #11

## 2023-02-19 MED FILL — TACROLIMUS 0.5 MG CAPSULE, IMMEDIATE-RELEASE: ORAL | 30 days supply | Qty: 180 | Fill #11

## 2023-03-06 DIAGNOSIS — Z940 Kidney transplant status: Principal | ICD-10-CM

## 2023-03-06 NOTE — Unmapped (Signed)
Returned a phone call from patient stating she has been having family issues and unable to come to an appt with Dr. Toni Arthurs at this time. Patient agreed to get updated labs at [facility] and will reach out to get an appt scheduled when possible. Stressed the importance of following up with Dr. Toni Arthurs and getting monthly labs. Verified medications are correct, no other needs at this time.

## 2023-03-18 ENCOUNTER — Ambulatory Visit: Admit: 2023-03-18 | Discharge: 2023-03-19 | Payer: MEDICAID

## 2023-03-18 LAB — CBC W/ AUTO DIFF
BASOPHILS ABSOLUTE COUNT: 0 10*9/L (ref 0.0–0.1)
BASOPHILS RELATIVE PERCENT: 1.2 %
EOSINOPHILS ABSOLUTE COUNT: 0.1 10*9/L (ref 0.0–0.5)
EOSINOPHILS RELATIVE PERCENT: 3.8 %
HEMATOCRIT: 33.6 % — ABNORMAL LOW (ref 34.0–44.0)
HEMOGLOBIN: 10.4 g/dL — ABNORMAL LOW (ref 11.3–14.9)
LYMPHOCYTES ABSOLUTE COUNT: 1 10*9/L — ABNORMAL LOW (ref 1.1–3.6)
LYMPHOCYTES RELATIVE PERCENT: 27.7 %
MEAN CORPUSCULAR HEMOGLOBIN CONC: 31 g/dL — ABNORMAL LOW (ref 32.0–36.0)
MEAN CORPUSCULAR HEMOGLOBIN: 18.9 pg — ABNORMAL LOW (ref 25.9–32.4)
MEAN CORPUSCULAR VOLUME: 61 fL — ABNORMAL LOW (ref 77.6–95.7)
MEAN PLATELET VOLUME: 9 fL (ref 6.8–10.7)
MONOCYTES ABSOLUTE COUNT: 0.4 10*9/L (ref 0.3–0.8)
MONOCYTES RELATIVE PERCENT: 10.3 %
NEUTROPHILS ABSOLUTE COUNT: 2.1 10*9/L (ref 1.8–7.8)
NEUTROPHILS RELATIVE PERCENT: 57 %
NUCLEATED RED BLOOD CELLS: 0 /100{WBCs} (ref <=4)
PLATELET COUNT: 197 10*9/L (ref 150–450)
RED BLOOD CELL COUNT: 5.51 10*12/L — ABNORMAL HIGH (ref 3.95–5.13)
RED CELL DISTRIBUTION WIDTH: 16 % — ABNORMAL HIGH (ref 12.2–15.2)
WBC ADJUSTED: 3.7 10*9/L (ref 3.6–11.2)

## 2023-03-18 LAB — IRON & TIBC
IRON SATURATION (CALC): 45 %
IRON: 99 ug/dL
TOTAL IRON BINDING CAPACITY (CALC): 220.5 ug/dL — ABNORMAL LOW (ref 250.0–425.0)
TRANSFERRIN: 175 mg/dL — ABNORMAL LOW

## 2023-03-18 LAB — COMPREHENSIVE METABOLIC PANEL
ALBUMIN: 4.3 g/dL (ref 3.4–5.0)
ALKALINE PHOSPHATASE: 113 U/L (ref 46–116)
ALT (SGPT): 15 U/L (ref 10–49)
ANION GAP: 7 mmol/L (ref 5–14)
AST (SGOT): 21 U/L (ref <=34)
BILIRUBIN TOTAL: 0.7 mg/dL (ref 0.3–1.2)
BLOOD UREA NITROGEN: 24 mg/dL — ABNORMAL HIGH (ref 9–23)
BUN / CREAT RATIO: 27
CALCIUM: 10.1 mg/dL (ref 8.7–10.4)
CHLORIDE: 109 mmol/L — ABNORMAL HIGH (ref 98–107)
CO2: 25.4 mmol/L (ref 20.0–31.0)
CREATININE: 0.89 mg/dL
EGFR CKD-EPI (2021) FEMALE: 75 mL/min/{1.73_m2} (ref >=60)
GLUCOSE RANDOM: 89 mg/dL (ref 70–99)
POTASSIUM: 4.2 mmol/L (ref 3.4–4.8)
PROTEIN TOTAL: 7.2 g/dL (ref 5.7–8.2)
SODIUM: 141 mmol/L (ref 135–145)

## 2023-03-18 LAB — URINALYSIS WITH MICROSCOPY
BACTERIA: NONE SEEN /HPF
BILIRUBIN UA: NEGATIVE
BLOOD UA: NEGATIVE
GLUCOSE UA: NEGATIVE
KETONES UA: NEGATIVE
LEUKOCYTE ESTERASE UA: NEGATIVE
NITRITE UA: NEGATIVE
PH UA: 6.5 (ref 5.0–9.0)
PROTEIN UA: NEGATIVE
RBC UA: <1 /HPF (ref <=4)
SPECIFIC GRAVITY UA: 1.006 (ref 1.003–1.030)
SQUAMOUS EPITHELIAL: <1 /HPF (ref 0–5)
UROBILINOGEN UA: <2
WBC UA: 1 /HPF (ref 0–5)

## 2023-03-18 LAB — LIPID PANEL
CHOLESTEROL/HDL RATIO SCREEN: 4 (ref 1.0–4.5)
CHOLESTEROL: 209 mg/dL — ABNORMAL HIGH (ref <=200)
HDL CHOLESTEROL: 52 mg/dL (ref 40–60)
LDL CHOLESTEROL CALCULATED: 131 mg/dL — ABNORMAL HIGH (ref 40–99)
NON-HDL CHOLESTEROL: 157 mg/dL — ABNORMAL HIGH (ref 70–130)
TRIGLYCERIDES: 132 mg/dL (ref 0–150)
VLDL CHOLESTEROL CAL: 26.4 mg/dL (ref 11–40)

## 2023-03-18 LAB — ALBUMIN / CREATININE URINE RATIO
ALBUMIN QUANT URINE: 0.6 mg/dL
ALBUMIN/CREATININE RATIO: 14.5 ug/mg (ref 0.0–30.0)
CREATININE, URINE: 41.4 mg/dL

## 2023-03-18 LAB — PARATHYROID HORMONE (PTH): PARATHYROID HORMONE INTACT: 193 pg/mL — ABNORMAL HIGH (ref 18.4–80.1)

## 2023-03-18 LAB — PROTEIN / CREATININE RATIO, URINE
CREATININE, URINE: 41.4 mg/dL
PROTEIN URINE: <6 mg/dL

## 2023-03-18 LAB — MAGNESIUM: MAGNESIUM: 1.4 mg/dL — ABNORMAL LOW (ref 1.6–2.6)

## 2023-03-18 LAB — FERRITIN: FERRITIN: 1461 ng/mL — ABNORMAL HIGH

## 2023-03-18 LAB — HEMOGLOBIN A1C
ESTIMATED AVERAGE GLUCOSE: 111 mg/dL
HEMOGLOBIN A1C: 5.5 % (ref 4.8–5.6)

## 2023-03-18 LAB — PHOSPHORUS: PHOSPHORUS: 3.3 mg/dL (ref 2.4–5.1)

## 2023-03-19 DIAGNOSIS — Z940 Kidney transplant status: Principal | ICD-10-CM

## 2023-03-19 LAB — TACROLIMUS LEVEL, TROUGH: TACROLIMUS, TROUGH: 6.6 ng/mL (ref 5.0–15.0)

## 2023-03-19 MED ORDER — MAGNESIUM OXIDE 400 MG (241.3 MG MAGNESIUM) TABLET
ORAL_TABLET | Freq: Two times a day (BID) | ORAL | 11 refills | 30 days
Start: 2023-03-19 — End: ?

## 2023-03-19 MED ORDER — TACROLIMUS 0.5 MG CAPSULE, IMMEDIATE-RELEASE
ORAL_CAPSULE | Freq: Two times a day (BID) | ORAL | 11 refills | 30 days
Start: 2023-03-19 — End: ?

## 2023-03-19 MED ORDER — CHLORTHALIDONE 25 MG TABLET
ORAL_TABLET | Freq: Every morning | ORAL | 11 refills | 30 days
Start: 2023-03-19 — End: ?

## 2023-03-20 LAB — BK VIRUS QUANTITATIVE PCR, BLOOD: BK BLOOD RESULT: NOT DETECTED

## 2023-03-20 LAB — EBV QUANTITATIVE PCR, BLOOD: EBV VIRAL LOAD RESULT: NOT DETECTED

## 2023-03-20 LAB — CMV DNA, QUANTITATIVE, PCR: CMV VIRAL LD: NOT DETECTED

## 2023-03-21 MED ORDER — MAGNESIUM OXIDE 400 MG (241.3 MG MAGNESIUM) TABLET
ORAL_TABLET | Freq: Two times a day (BID) | ORAL | 11 refills | 30 days | Status: CP
Start: 2023-03-21 — End: ?
  Filled 2023-04-01: qty 120, 60d supply, fill #0

## 2023-03-21 MED ORDER — TACROLIMUS 0.5 MG CAPSULE, IMMEDIATE-RELEASE
ORAL_CAPSULE | Freq: Two times a day (BID) | ORAL | 11 refills | 30 days | Status: CP
Start: 2023-03-21 — End: ?
  Filled 2023-03-28: qty 180, 30d supply, fill #0

## 2023-03-21 MED ORDER — CHLORTHALIDONE 25 MG TABLET
ORAL_TABLET | Freq: Every morning | ORAL | 11 refills | 30 days | Status: CP
Start: 2023-03-21 — End: ?
  Filled 2023-03-28: qty 30, 30d supply, fill #0

## 2023-03-21 NOTE — Unmapped (Signed)
[facility] Delivery Pharm[facility] Refill Coordination Note    Specialty Medication(s) to be Shipped:   Transplant: mycophenolate mofetil 180mg  and tacrolimus 0.5mg     Other medication(s) to be shipped:  cinacalcet, magnesium, chlorthalidone     Lynn Erickson, DOB: 05-Aug-1965  Phone: [phone] (home)       All above HIPAA information was verified with patient.     Was a Nurse, learning disability used for this call? No    Completed refill call assessment today to schedule patient's medication shipment from the [facility]  [phone]).  All relevant notes have been reviewed.     Specialty medication(s) and dose(s) confirmed: Regimen is correct and unchanged.   Changes to medications: Lynn Erickson reports no changes at this time.  Changes to insurance: No  New side effects reported not previously addressed with a pharmacist or physician: None reported  Questions for the pharmacist: No    Confirmed patient received a Conservation officer, historic buildings and a Surveyor, mining with first shipment. The patient will receive a drug information handout for each medication shipped and additional FDA Medication Guides as required.       DISEASE/MEDICATION-SPECIFIC INFORMATION        N/A    SPECIALTY MEDICATION ADHERENCE     Medication Adherence    Patient reported X missed doses in the last month: 0  Specialty Medication: tacrolimus 0.5 MG capsule (PROGRAF)  Patient is on additional specialty medications: Yes  Additional Specialty Medications: mycophenolate 180 MG EC tablet (MYFORTIC)  Patient Reported Additional Medication X Missed Doses in the Last Month: 0  Patient is on more than two specialty medications: No  Any gaps in refill history greater than 2 weeks in the last 3 months: no  Demonstrates understanding of importance of adherence: yes  Informant: patient  Adherence tools used: patient uses a pill box to manage medications  Confirmed plan for next specialty medication refill: delivery by pharmacy  Refills needed for supportive medications: not needed          Refill Coordination    Has the Patients' Contact Information Changed: No  Is the Shipping Address Different: No         Were doses missed due to medication being on hold? No    mycophenolate 180  mg: 10 days of medicine on hand   tacrolimus 0.5  mg: 10 days of medicine on hand       REFERRAL TO PHARMACIST     Referral to the pharmacist: Not needed      [facility]     Shipping address confirmed in Epic.       Delivery Scheduled: Yes, Expected medication delivery date: 03/26/2023.     Medication will be delivered via Next Day Courier to the prescription address in Epic WAM.    Lynn Erickson   [facility]  Specialty Technician

## 2023-03-22 LAB — VITAMIN D 25 HYDROXY: VITAMIN D, TOTAL (25OH): 10.5 ng/mL — ABNORMAL LOW (ref 20.0–80.0)

## 2023-03-25 MED ORDER — ERGOCALCIFEROL (VITAMIN D2) 1,250 MCG (50,000 UNIT) CAPSULE
ORAL_CAPSULE | ORAL | 11 refills | 28 days
Start: 2023-03-25 — End: 2024-03-24

## 2023-03-25 MED ORDER — ROSUVASTATIN 10 MG TABLET
ORAL_TABLET | Freq: Every day | ORAL | 3 refills | 90 days
Start: 2023-03-25 — End: 2024-03-24

## 2023-03-27 LAB — HLA DS POST TRANSPLANT
ANTI-DONOR DRW #1 MFI: 129 MFI
ANTI-DONOR HLA-A #1 MFI: 389 MFI
ANTI-DONOR HLA-A #2 MFI: 16 MFI
ANTI-DONOR HLA-B #1 MFI: 12 MFI
ANTI-DONOR HLA-B #2 MFI: 8 MFI
ANTI-DONOR HLA-C #2 MFI: 16 MFI
ANTI-DONOR HLA-DP #2 MFI: 132 MFI
ANTI-DONOR HLA-DQB #1 MFI: 55 MFI
ANTI-DONOR HLA-DQB #2 MFI: 81 MFI
ANTI-DONOR HLA-DR #2 MFI: 71 MFI

## 2023-03-27 LAB — FSAB CLASS 2 ANTIBODY SPECIFICITY: HLA CL2 AB RESULT: NEGATIVE

## 2023-03-27 LAB — FSAB CLASS 1 ANTIBODY SPECIFICITY: HLA CLASS 1 ANTIBODY RESULT: NEGATIVE

## 2023-03-27 NOTE — Unmapped (Signed)
Lynn Erickson 's entire shipment will be rescheduled as a result of order was held due to system issues.     I have spoken with the patient  at [phone]  and communicated the delivery change. We will reschedule the medication for the delivery date that the patient agreed upon.  We have confirmed the delivery date as 03/27/2023, via same day courier.

## 2023-03-28 MED FILL — MYCOPHENOLATE SODIUM 180 MG TABLET,DELAYED RELEASE: ORAL | 30 days supply | Qty: 180 | Fill #11

## 2023-03-28 MED FILL — CINACALCET 30 MG TABLET: ORAL | 90 days supply | Qty: 90 | Fill #2

## 2023-04-01 MED ORDER — ROSUVASTATIN 10 MG TABLET
ORAL_TABLET | Freq: Every day | ORAL | 11 refills | 30 days | Status: CP
Start: 2023-04-01 — End: 2024-03-31
  Filled 2023-04-29: qty 30, 30d supply, fill #0

## 2023-04-01 MED ORDER — ERGOCALCIFEROL (VITAMIN D2) 1,250 MCG (50,000 UNIT) CAPSULE
ORAL_CAPSULE | ORAL | 0 refills | 84 days | Status: CP
Start: 2023-04-01 — End: 2023-06-18
  Filled 2023-04-29: qty 12, 84d supply, fill #0

## 2023-04-01 NOTE — Unmapped (Signed)
Reviewed labs with Dr. Toni Arthurs, he recommended to start rosuvastatin 10 mg daily and ergocalciferol 50,000 units weekly x12 weeks. Will repeat vit D levels in 12 weeks. Encouraged patient to get monthly labs and schedule a follow up visit with Dr. Toni Arthurs as soon as possible. Patient is having some personal issues and will schedule an appointment when able.     Patient inquired about drinking Celestial Sleepytime Tea, checked ingredients with [employer], CPP - agreed it was safe to drink.

## 2023-04-24 DIAGNOSIS — Z940 Kidney transplant status: Principal | ICD-10-CM

## 2023-04-28 DIAGNOSIS — Z940 Kidney transplant status: Principal | ICD-10-CM

## 2023-04-28 MED ORDER — MYCOPHENOLATE SODIUM 180 MG TABLET,DELAYED RELEASE
ORAL_TABLET | Freq: Two times a day (BID) | ORAL | 11 refills | 30 days | Status: CP
Start: 2023-04-28 — End: 2024-04-27
  Filled 2023-04-29: qty 180, 30d supply, fill #0

## 2023-04-28 NOTE — Unmapped (Signed)
[facility] Pharm[facility] Clinical Assessment & Refill Coordination Note    Lynn Erickson, DOB: 1966-04-28  Phone: [phone] (home)     All above HIPAA information was verified with patient.     Was a Nurse, learning disability used for this call? No    Specialty Medication(s):   Transplant:  mycophenolic acid 180mg  and tacrolimus 0.5mg      Current Outpatient Medications   Medication Sig Dispense Refill    chlorthalidone (HYGROTON) 25 MG tablet Take 1 tablet (25 mg total) by mouth every morning. 30 tablet 11    cinacalcet (SENSIPAR) 30 MG tablet Take 1 tablet (30 mg total) by mouth daily. 90 tablet 3    ergocalciferol-1,250 mcg, 50,000 unit, (DRISDOL) 1,250 mcg (50,000 unit) capsule Take 1 capsule (1,250 mcg total) by mouth once a week for 12 doses. 12 capsule 0    magnesium oxide (MAG-OX) 400 mg (241.3 mg elemental magnesium) tablet Take 1 tablet (400 mg total) by mouth Two (2) times a day. 60 tablet 11    mycophenolate (MYFORTIC) 180 MG EC tablet Take 3 tablets (540 mg total) by mouth two (2) times a day. 180 tablet 11    rosuvastatin (CRESTOR) 10 MG tablet Take 1 tablet (10 mg total) by mouth daily. 30 tablet 11    tacrolimus (PROGRAF) 0.5 MG capsule Take 3 capsules (1.5 mg total) by mouth two (2) times a day 180 capsule 11     No current facility-administered medications for this visit.        Changes to medications: Zilla reports no changes at this time.    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       Changes to allergies: No    SPECIALTY MEDICATION ADHERENCE     Tacrolimus 0.5mg   : 10 days of medicine on hand   Mycophenolate 180mg   : 4 days of medicine on hand       Medication Adherence    Patient reported X missed doses in the last month: 0  Specialty Medication: mycophenolate 180mg   Patient is on additional specialty medications: Yes  Additional Specialty Medications: Tacrolimus 0.5mg   Patient Reported Additional Medication X Missed Doses in the Last Month: 0  Patient is on more than two specialty medications: No  Adherence tools used: patient uses a pill box to manage medications          Specialty medication(s) dose(s) confirmed: Regimen is correct and unchanged.     Are there any concerns with adherence? No    Adherence counseling provided? Not needed    CLINICAL MANAGEMENT AND INTERVENTION      Clinical Benefit Assessment:    Do you feel the medicine is effective or helping your condition? Yes    Clinical Benefit counseling provided? Not needed    Adverse Effects Assessment:    Are you experiencing any side effects? No    Are you experiencing difficulty administering your medicine? No    Quality of Life Assessment:    Quality of Life    Rheumatology  Oncology  Dermatology  Cystic Fibrosis          How many  days over the past month did your transplant  keep you from your normal activities? For example, brushing your teeth or getting up in the morning. 0    Have you discussed this with your provider? Not needed    Acute Infection Status:    Acute infections noted within Epic:  No active infections  Patient reported infection: None    Therapy Appropriateness:    Is therapy appropriate based on current medication list, adverse reactions, adherence, clinical benefit and progress toward achieving therapeutic goals? Yes, therapy is appropriate and should be continued     DISEASE/MEDICATION-SPECIFIC INFORMATION      N/A    Solid Organ Transplant: Not Applicable    PATIENT SPECIFIC NEEDS     Does the patient have any physical, cognitive, or cultural barriers? No    Is the patient high risk? Yes, patient is taking a REMS drug. Medication is dispensed in compliance with REMS program    Did the patient require a clinical intervention? No    Does the patient require physician intervention or other additional services (i.e., nutrition, smoking cessation, social work)? No    SOCIAL DETERMINANTS OF HEALTH     At the [facility], we have learned that life circumstances - like trouble affording food, housing, utilities, or transportation can affect the health of many of our patients.   That is why we wanted to ask: are you currently experiencing any life circumstances that are negatively impacting your health and/or quality of life? Patient declined to answer    Social Determinants of Health     Food Insecurity: Not on file   Internet Connectivity: Not on file   Housing/Utilities: Not on file   Tobacco Use: Low Risk  (08/13/2021)    Patient History     Smoking Tobacco Use: Never     Smokeless Tobacco Use: Never     Passive Exposure: Not on file   Transportation Needs: Unmet Transportation Needs (09/06/2021)    PRAPARE - Transportation     Lack of Transportation (Medical): Yes     Lack of Transportation (Non-Medical): Yes   Alcohol Use: Not At Risk (07/22/2017)    Received from [facility]    AUDIT-C     Frequency of Alcohol Consumption: Never     Average Number of Drinks: Not on file     Frequency of Binge Drinking: Not on file   Interpersonal Safety: Unknown (04/28/2023)    Interpersonal Safety     Unsafe Where You Currently Live: Not on file     Physically Hurt by Anyone: Not on file     Abused by Anyone: Not on file   Physical Activity: Not on file   Intimate Partner Violence: Not on file   Stress: Not on file   Substance Use: Not on file   Social Connections: Not on file   Financial Resource Strain: Not on file   Depression: Not at risk (08/27/2019)    Received from [facility]    PHQ-2     (OBSOLETE) Total Prescreening Score: 0   Health Literacy: Not on file       Would you be willing to receive help with any of the needs that you have identified today? Not applicable       SHIPPING Specialty Medication(s) to be Shipped:   Transplant:  mycophenolic acid 180mg  and tacrolimus 0.5mg     Other medication(s) to be shipped:  crestor, vitamin d, chlorthalidone  Changes to insurance: No    Delivery Scheduled: Yes, Expected medication delivery date: 04/30/2023.  However, Rx request for refills was sent to the provider as there are none remaining.     Medication will be delivered via UPS to the confirmed prescription address in [facility].    The patient will receive a drug information handout for each medication shipped and additional FDA Medication Guides as required.  Verified that patient has previously received a Conservation officer, historic buildings and a Surveyor, mining.    The patient or caregiver noted above participated in the development of this care plan and knows that they can request review of or adjustments to the care plan at any time.      All of the patient's questions and concerns have been addressed.    Thad Ranger, PharmD   [facility] Specialty Pharmacist

## 2023-04-28 NOTE — Unmapped (Signed)
Pt request for RX Refill

## 2023-04-29 MED FILL — CHLORTHALIDONE 25 MG TABLET: ORAL | 30 days supply | Qty: 30 | Fill #1

## 2023-04-29 MED FILL — TACROLIMUS 0.5 MG CAPSULE, IMMEDIATE-RELEASE: ORAL | 30 days supply | Qty: 180 | Fill #1

## 2023-05-27 NOTE — Unmapped (Signed)
11/19: patient is aware that [facility] will need credit card on file starting 12/1 for all meds with copays/prices before sending out-ef    [facility] Special[facility] and Home Delivery Pharm[facility] Clinical Assessment & Refill Coordination Note    Lynn Erickson, DOB: Jul 10, 1965  Phone: [phone] (home)     All above HIPAA information was verified with patient.     Was a Nurse, learning disability used for this call? No    Specialty Medication(s):   Transplant:  mycophenolic acid 180mg  and tacrolimus 0.5mg      Current Outpatient Medications   Medication Sig Dispense Refill    chlorthalidone (HYGROTON) 25 MG tablet Take 1 tablet (25 mg total) by mouth every morning. 30 tablet 11    cinacalcet (SENSIPAR) 30 MG tablet Take 1 tablet (30 mg total) by mouth daily. 90 tablet 3    ergocalciferol-1,250 mcg, 50,000 unit, (DRISDOL) 1,250 mcg (50,000 unit) capsule Take 1 capsule (1,250 mcg total) by mouth once a week for 12 doses. 12 capsule 0    magnesium oxide (MAG-OX) 400 mg (241.3 mg elemental magnesium) tablet Take 1 tablet (400 mg total) by mouth Two (2) times a day. 60 tablet 11    mycophenolate (MYFORTIC) 180 MG EC tablet Take 3 tablets (540 mg total) by mouth two (2) times a day. 180 tablet 11    rosuvastatin (CRESTOR) 10 MG tablet Take 1 tablet (10 mg total) by mouth daily. 30 tablet 11    tacrolimus (PROGRAF) 0.5 MG capsule Take 3 capsules (1.5 mg total) by mouth two (2) times a day 180 capsule 11     No current facility-administered medications for this visit.        Changes to medications: Lakiya reports no changes at this time.    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       Changes to allergies: No    SPECIALTY MEDICATION ADHERENCE Mycophenolate 180mg   : 5 days of medicine on hand   Tacrolimus 0.5mg   : 5 days of medicine on hand       Medication Adherence    Patient reported X missed doses in the last month: 0  Specialty Medication: tacrolimus 0.5 MG capsule (PROGRAF)  Patient is on additional specialty medications: Yes  Additional Specialty Medications: mycophenolate 180 MG EC tablet (MYFORTIC)  Patient Reported Additional Medication X Missed Doses in the Last Month: 0  Patient is on more than two specialty medications: No  Adherence tools used: patient uses a pill box to manage medications          Specialty medication(s) dose(s) confirmed: Regimen is correct and unchanged.     Are there any concerns with adherence? No    Adherence counseling provided? Not needed    CLINICAL MANAGEMENT AND INTERVENTION      Clinical Benefit Assessment:    Do you feel the medicine is effective or helping your condition? Yes    Clinical Benefit counseling provided? Not needed    Adverse Effects Assessment:    Are you experiencing any side effects? No    Are you experiencing difficulty administering your medicine? No    Quality of Life Assessment:  Quality of Life    Rheumatology  Oncology  Dermatology  Cystic Fibrosis          How many days over the past month did your transplant  keep you from your normal activities? For example, brushing your teeth or getting up in the morning. 0    Have you discussed this with your provider? Not needed    Acute Infection Status:    Acute infections noted within Epic:  No active infections  Patient reported infection: None    Therapy Appropriateness:    Is therapy appropriate based on current medication list, adverse reactions, adherence, clinical benefit and progress toward achieving therapeutic goals? Yes, therapy is appropriate and should be continued     DISEASE/MEDICATION-SPECIFIC INFORMATION      N/A    Solid Organ Transplant: Not Applicable    PATIENT SPECIFIC NEEDS     Does the patient have any physical, cognitive, or cultural barriers? No    Is the patient high risk? Yes, patient is taking a REMS drug. Medication is dispensed in compliance with REMS program    Did the patient require a clinical intervention? No    Does the patient require physician intervention or other additional services (i.e., nutrition, smoking cessation, social work)? No    SOCIAL DETERMINANTS OF HEALTH     At the [facility], we have learned that life circumstances - like trouble affording food, housing, utilities, or transportation can affect the health of many of our patients.   That is why we wanted to ask: are you currently experiencing any life circumstances that are negatively impacting your health and/or quality of life? Patient declined to answer    Social Drivers of Health     Food Insecurity: Not on file   Internet Connectivity: Not on file   Housing/Utilities: Not on file   Tobacco Use: Low Risk  (08/13/2021)    Patient History     Smoking Tobacco Use: Never     Smokeless Tobacco Use: Never     Passive Exposure: Not on file   Transportation Needs: Unmet Transportation Needs (09/06/2021)    PRAPARE - Transportation     Lack of Transportation (Medical): Yes     Lack of Transportation (Non-Medical): Yes   Alcohol Use: Not At Risk (07/22/2017)    Received from [facility]    AUDIT-C     Frequency of Alcohol Consumption: Never     Average Number of Drinks: Not on file     Frequency of Binge Drinking: Not on file   Interpersonal Safety: Not on file   Physical Activity: Not on file   Intimate Partner Violence: Not on file   Stress: Not on file   Substance Use: Not on file (05/13/2023)   Social Connections: Not on file   Financial Resource Strain: Not on file   Depression: Not at risk (08/27/2019)    Received from [facility]    PHQ-2     (OBSOLETE) Total Prescreening Score: 0   Health Literacy: Not on file       Would you be willing to receive help with any of the needs that you have identified today? Not applicable       SHIPPING     Specialty Medication(s) to be Shipped:   Transplant:  mycophenolic acid 180mg  and tacrolimus 0.5mg     Other medication(s) to be shipped:  chlorthalidone, magnesium rosuvastatin     Changes to insurance: No    Delivery  Scheduled: Yes, Expected medication delivery date: 05/29/2023.     Medication will be delivered via UPS to the confirmed prescription address in [facility].    The patient will receive a drug information handout for each medication shipped and additional FDA Medication Guides as required.  Verified that patient has previously received a Conservation officer, historic buildings and a Surveyor, mining.    The patient or caregiver noted above participated in the development of this care plan and knows that they can request review of or adjustments to the care plan at any time.      All of the patient's questions and concerns have been addressed.    Thad Ranger, PharmD   [facility] Special[facility] and Home Delivery Pharmacy Specialty Pharmacist

## 2023-05-28 MED FILL — TACROLIMUS 0.5 MG CAPSULE, IMMEDIATE-RELEASE: ORAL | 30 days supply | Qty: 180 | Fill #2

## 2023-05-28 MED FILL — MAGNESIUM OXIDE 400 MG (241.3 MG MAGNESIUM) TABLET: ORAL | 60 days supply | Qty: 120 | Fill #1

## 2023-05-28 MED FILL — MYCOPHENOLATE SODIUM 180 MG TABLET,DELAYED RELEASE: ORAL | 30 days supply | Qty: 180 | Fill #1

## 2023-05-28 MED FILL — CHLORTHALIDONE 25 MG TABLET: ORAL | 30 days supply | Qty: 30 | Fill #2

## 2023-05-28 MED FILL — ROSUVASTATIN 10 MG TABLET: ORAL | 30 days supply | Qty: 30 | Fill #1

## 2023-07-04 MED FILL — CINACALCET 30 MG TABLET: ORAL | 90 days supply | Qty: 90 | Fill #3

## 2023-07-04 MED FILL — TACROLIMUS 0.5 MG CAPSULE, IMMEDIATE-RELEASE: ORAL | 30 days supply | Qty: 180 | Fill #3

## 2023-07-04 MED FILL — MYCOPHENOLATE SODIUM 180 MG TABLET,DELAYED RELEASE: ORAL | 30 days supply | Qty: 180 | Fill #2

## 2023-07-04 NOTE — Unmapped (Signed)
[facility] Specialty and Home Delivery Pharmacy Refill Coordination Note    Specialty Medication(s) to be Shipped:   Transplant: mycophenolate mofetil 180mg  and tacrolimus 0.5mg     Other medication(s) to be shipped:  cinacalcet     Lynn Erickson, DOB: [birth year]  Phone: [phone] (home)       All above HIPAA information was verified with patient.     Was a Nurse, learning disability used for this call? No    Completed refill call assessment today to schedule patient's medication shipment from the [facility] and Home Delivery Pharmacy  [phone]).  All relevant notes have been reviewed.     Specialty medication(s) and dose(s) confirmed: Regimen is correct and unchanged.   Changes to medications: Lynn Erickson reports no changes at this time.  Changes to insurance: No  New side effects reported not previously addressed with a pharmacist or physician: None reported  Questions for the pharmacist: No    Confirmed patient received a Conservation officer, historic buildings and a Surveyor, mining with first shipment. The patient will receive a drug information handout for each medication shipped and additional FDA Medication Guides as required.       DISEASE/MEDICATION-SPECIFIC INFORMATION        N/A    SPECIALTY MEDICATION ADHERENCE     Medication Adherence    Patient reported X missed doses in the last month: 0  Specialty Medication: mycophenolate 180 MG EC tablet (MYFORTIC)  Patient is on additional specialty medications: Yes  Additional Specialty Medications: tacrolimus 0.5 MG capsule (PROGRAF)  Patient Reported Additional Medication X Missed Doses in the Last Month: 0  Patient is on more than two specialty medications: No  Informant: patient  Adherence tools used: patient uses a pill box to manage medications              Were doses missed due to medication being on hold? No     mycophenolate 180 MG EC tablet (MYFORTIC): 7 days of medicine on hand    tacrolimus 0.5 MG capsule (PROGRAF): 7 days of medicine on hand       REFERRAL TO PHARMACIST Referral to the pharmacist: Not needed      [facility]     Shipping address confirmed in Epic.       Delivery Scheduled: Yes, Expected medication delivery date: 07/04/23.     Medication will be delivered via Same Day Courier to the prescription address in Epic WAM.    Lynn Erickson   [facility] Specialty and Home Delivery Pharmacy  Specialty Technician

## 2023-08-04 NOTE — Unmapped (Signed)
[facility] Specialty and Home Delivery Pharmacy Refill Coordination Note    Specialty Medication(s) to be Shipped:   Transplant: mycophenolate mofetil 180mg  and tacrolimus 0.5mg     Other medication(s) to be shipped: magnesium oxide 400 mg (241.3 mg elemental) tablet (MAG-OX)     Lynn Erickson, DOB: 08-Oct-1965  Phone: [phone] (home)       All above HIPAA information was verified with patient.     Was a Nurse, learning disability used for this call? No    Completed refill call assessment today to schedule patient's medication shipment from the [facility] and Home Delivery Pharmacy  [phone]).  All relevant notes have been reviewed.     Specialty medication(s) and dose(s) confirmed: Regimen is correct and unchanged.   Changes to medications: [name] reports no changes at this time.  Changes to insurance: No  New side effects reported not previously addressed with a pharmacist or physician: None reported  Questions for the pharmacist: No    Confirmed patient received a Conservation officer, historic buildings and a Surveyor, mining with first shipment. The patient will receive a drug information handout for each medication shipped and additional FDA Medication Guides as required.       DISEASE/MEDICATION-SPECIFIC INFORMATION        N/A    SPECIALTY MEDICATION ADHERENCE     Medication Adherence    Patient reported X missed doses in the last month: 0  Specialty Medication: mycophenolate 180 MG EC tablet (MYFORTIC)  Patient is on additional specialty medications: Yes  Additional Specialty Medications: tacrolimus 0.5 MG capsule (PROGRAF)  Patient Reported Additional Medication X Missed Doses in the Last Month: 0  Patient is on more than two specialty medications: No  Any gaps in refill history greater than 2 weeks in the last 3 months: no  Demonstrates understanding of importance of adherence: yes  Adherence tools used: patient uses a pill box to manage medications              Were doses missed due to medication being on hold? No    mycophenolate 180   mg: 1 days of medicine on hand   tacrolimus 0.5  mg: 1 days of medicine on hand       REFERRAL TO PHARMACIST     Referral to the pharmacist: Not needed      [facility]     Shipping address confirmed in Epic.       Delivery Scheduled: Yes, Expected medication delivery date: 08/06/23.     Medication will be delivered via Same Day Courier to the prescription address in Epic WAM.    Moshe Salisbury   [facility] Specialty and Home Delivery Pharmacy  Specialty Technician

## 2023-08-06 MED FILL — MYCOPHENOLATE SODIUM 180 MG TABLET,DELAYED RELEASE: ORAL | 30 days supply | Qty: 180 | Fill #3

## 2023-08-06 MED FILL — MAGNESIUM OXIDE 400 MG (241.3 MG MAGNESIUM) TABLET: ORAL | 60 days supply | Qty: 120 | Fill #2

## 2023-08-06 MED FILL — TACROLIMUS 0.5 MG CAPSULE, IMMEDIATE-RELEASE: ORAL | 30 days supply | Qty: 180 | Fill #4

## 2023-09-08 NOTE — Unmapped (Signed)
 [facility] Specialty and Home Delivery Pharmacy Refill Coordination Note    Specialty Medication(s) to be Shipped:   Transplant: mycophenolate mofetil 180 mg and tacrolimus 0.5mg     Other medication(s) to be shipped: No additional medications requested for fill at this time     Lynn Erickson, DOB: 05/31/1966  Phone: [phone] (home)       All above HIPAA information was verified with patient.     Was a Nurse, learning disability used for this call? No    Completed refill call assessment today to schedule patient's medication shipment from the [facility] and Home Delivery Pharmacy  [phone]).  All relevant notes have been reviewed.     Specialty medication(s) and dose(s) confirmed: Regimen is correct and unchanged.   Changes to medications: Aniyia reports no changes at this time.  Changes to insurance: No  New side effects reported not previously addressed with a pharmacist or physician: None reported  Questions for the pharmacist: No    Confirmed patient received a Conservation officer, historic buildings and a Surveyor, mining with first shipment. The patient will receive a drug information handout for each medication shipped and additional FDA Medication Guides as required.       DISEASE/MEDICATION-SPECIFIC INFORMATION        N/A    SPECIALTY MEDICATION ADHERENCE     Medication Adherence    Patient reported X missed doses in the last month: 0  Specialty Medication: mycophenolate 180 MG EC tablet (MYFORTIC)  Patient is on additional specialty medications: Yes  Additional Specialty Medications: tacrolimus 0.5 MG capsule (PROGRAF)  Patient Reported Additional Medication X Missed Doses in the Last Month: 0  Patient is on more than two specialty medications: No  Adherence tools used: patient uses a pill box to manage medications              Were doses missed due to medication being on hold? No    mycophenolate 180 mg: 1 days of medicine on hand   tacrolimus 0.5 mg: 1 days of medicine on hand        REFERRAL TO PHARMACIST     Referral to the pharmacist: Not needed      [facility]     Shipping address confirmed in Epic.       Delivery Scheduled: Yes, Expected medication delivery date: 09/09/2023.     Medication will be delivered via Same Day Courier to the prescription address in Epic WAM.    [name]   [facility] Specialty and Home Delivery Pharmacy  Specialty Technician

## 2023-09-09 MED FILL — MYCOPHENOLATE SODIUM 180 MG TABLET,DELAYED RELEASE: ORAL | 30 days supply | Qty: 180 | Fill #4

## 2023-09-09 MED FILL — TACROLIMUS 0.5 MG CAPSULE, IMMEDIATE-RELEASE: ORAL | 30 days supply | Qty: 180 | Fill #5

## 2023-10-02 ENCOUNTER — Other Ambulatory Visit: Admit: 2023-10-02 | Discharge: 2023-10-03

## 2023-10-02 LAB — CBC W/ AUTO DIFF
BASOPHILS ABSOLUTE COUNT: 0.1 10*9/L (ref 0.0–0.1)
BASOPHILS RELATIVE PERCENT: 1.1 %
EOSINOPHILS ABSOLUTE COUNT: 0.2 10*9/L (ref 0.0–0.5)
EOSINOPHILS RELATIVE PERCENT: 3.2 %
HEMATOCRIT: 33.9 % — ABNORMAL LOW (ref 34.0–44.0)
HEMOGLOBIN: 10.4 g/dL — ABNORMAL LOW (ref 11.3–14.9)
LYMPHOCYTES ABSOLUTE COUNT: 1.1 10*9/L (ref 1.1–3.6)
LYMPHOCYTES RELATIVE PERCENT: 21.7 %
MEAN CORPUSCULAR HEMOGLOBIN CONC: 30.8 g/dL — ABNORMAL LOW (ref 32.0–36.0)
MEAN CORPUSCULAR HEMOGLOBIN: 18.7 pg — ABNORMAL LOW (ref 25.9–32.4)
MEAN CORPUSCULAR VOLUME: 60.7 fL — ABNORMAL LOW (ref 77.6–95.7)
MEAN PLATELET VOLUME: 8.7 fL (ref 6.8–10.7)
MONOCYTES ABSOLUTE COUNT: 0.7 10*9/L (ref 0.3–0.8)
MONOCYTES RELATIVE PERCENT: 12.4 %
NEUTROPHILS ABSOLUTE COUNT: 3.2 10*9/L (ref 1.8–7.8)
NEUTROPHILS RELATIVE PERCENT: 61.6 %
NUCLEATED RED BLOOD CELLS: 0 /100{WBCs} (ref <=4)
PLATELET COUNT: 201 10*9/L (ref 150–450)
RED BLOOD CELL COUNT: 5.58 10*12/L — ABNORMAL HIGH (ref 3.95–5.13)
RED CELL DISTRIBUTION WIDTH: 16.5 % — ABNORMAL HIGH (ref 12.2–15.2)
WBC ADJUSTED: 5.3 10*9/L (ref 3.6–11.2)

## 2023-10-02 LAB — URINALYSIS WITH MICROSCOPY
BACTERIA: NONE SEEN /HPF
BILIRUBIN UA: NEGATIVE
BLOOD UA: NEGATIVE
GLUCOSE UA: NEGATIVE
KETONES UA: NEGATIVE
NITRITE UA: NEGATIVE
PH UA: 6 (ref 5.0–9.0)
PROTEIN UA: NEGATIVE
RBC UA: 1 /HPF (ref <=4)
SPECIFIC GRAVITY UA: 1.013 (ref 1.003–1.030)
SQUAMOUS EPITHELIAL: <1 /HPF (ref 0–5)
UROBILINOGEN UA: <2
WBC UA: 6 /HPF — ABNORMAL HIGH (ref 0–5)

## 2023-10-02 LAB — COMPREHENSIVE METABOLIC PANEL
ALBUMIN: 4.3 g/dL (ref 3.4–5.0)
ALKALINE PHOSPHATASE: 113 U/L (ref 46–116)
ALT (SGPT): 11 U/L (ref 10–49)
ANION GAP: 14 mmol/L (ref 5–14)
AST (SGOT): 20 U/L (ref <=34)
BILIRUBIN TOTAL: 0.6 mg/dL (ref 0.3–1.2)
BLOOD UREA NITROGEN: 30 mg/dL — ABNORMAL HIGH (ref 9–23)
BUN / CREAT RATIO: 31
CALCIUM: 10.4 mg/dL (ref 8.7–10.4)
CHLORIDE: 102 mmol/L (ref 98–107)
CO2: 24.7 mmol/L (ref 20.0–31.0)
CREATININE: 0.98 mg/dL (ref 0.55–1.02)
EGFR CKD-EPI (2021) FEMALE: 67 mL/min/{1.73_m2} (ref >=60)
GLUCOSE RANDOM: 99 mg/dL (ref 70–99)
POTASSIUM: 3.7 mmol/L (ref 3.4–4.8)
PROTEIN TOTAL: 7.6 g/dL (ref 5.7–8.2)
SODIUM: 141 mmol/L (ref 135–145)

## 2023-10-02 LAB — SLIDE REVIEW

## 2023-10-02 LAB — ALBUMIN / CREATININE URINE RATIO
ALBUMIN QUANT URINE: 1.7 mg/dL
ALBUMIN/CREATININE RATIO: 12.9 ug/mg (ref 0.0–30.0)
CREATININE, URINE: 132.2 mg/dL

## 2023-10-02 LAB — PHOSPHORUS: PHOSPHORUS: 3.7 mg/dL (ref 2.4–5.1)

## 2023-10-02 LAB — PROTEIN / CREATININE RATIO, URINE
CREATININE, URINE: 132.6 mg/dL
PROTEIN URINE: 11.4 mg/dL
PROTEIN/CREAT RATIO, URINE: 0.086

## 2023-10-02 LAB — MAGNESIUM: MAGNESIUM: 1.6 mg/dL (ref 1.6–2.6)

## 2023-10-03 LAB — TACROLIMUS LEVEL, TROUGH: TACROLIMUS, TROUGH: 8.5 ng/mL (ref 5.0–15.0)

## 2023-10-03 MED ORDER — CINACALCET 30 MG TABLET
ORAL_TABLET | Freq: Every day | ORAL | 3 refills | 90 days
Start: 2023-10-03 — End: 2024-10-02

## 2023-10-03 NOTE — Unmapped (Signed)
 [facility] Specialty and Home Delivery Pharmacy Refill Coordination Note    Specialty Medication(s) to be Shipped:   Transplant: mycophenolate mofetil 180 mg and tacrolimus 0.5mg     Other medication(s) to be shipped:  cinacalcet and mag-ox     Lynn Erickson, DOB: [birth year]  Phone: [phone] (home)       All above HIPAA information was verified with patient.     Was a Nurse, learning disability used for this call? No    Completed refill call assessment today to schedule patient's medication shipment from the [facility] and Home Delivery Pharmacy  [phone]).  All relevant notes have been reviewed.     Specialty medication(s) and dose(s) confirmed: Regimen is correct and unchanged.   Changes to medications: [name] reports no changes at this time.  Changes to insurance: No  New side effects reported not previously addressed with a pharmacist or physician: None reported  Questions for the pharmacist: No    Confirmed patient received a Conservation officer, historic buildings and a Surveyor, mining with first shipment. The patient will receive a drug information handout for each medication shipped and additional FDA Medication Guides as required.       DISEASE/MEDICATION-SPECIFIC INFORMATION        N/A    SPECIALTY MEDICATION ADHERENCE     Medication Adherence    Patient reported X missed doses in the last month: 0  Specialty Medication: tacrolimus 0.5 MG capsule (PROGRAF)  Patient is on additional specialty medications: Yes  Additional Specialty Medications: tacrolimus 0.5 MG capsule (PROGRAF)  Patient Reported Additional Medication X Missed Doses in the Last Month: 0  Patient is on more than two specialty medications: No  Adherence tools used: patient uses a pill box to manage medications              Were doses missed due to medication being on hold? No    tacrolimus 0.5 mg: 4 days of medicine on hand   mycophenolate 180 mg: 4 days of medicine on hand       REFERRAL TO PHARMACIST     Referral to the pharmacist: Not needed      [facility] Shipping address confirmed in Epic.     Cost and Payment: Patient has a copay of $15.18. They are aware and have authorized the pharmacy to charge the credit card on file.    Delivery Scheduled: Yes, Expected medication delivery date: 10/06/2023.     Medication will be delivered via Same Day Courier to the prescription address in Epic WAM.    [name]   [facility] Specialty and Home Delivery Pharmacy  Specialty Technician

## 2023-10-03 NOTE — Unmapped (Signed)
 Pt request for RX Refill

## 2023-10-06 MED ORDER — CINACALCET 30 MG TABLET
ORAL_TABLET | Freq: Every day | ORAL | 3 refills | 90 days | Status: CP
Start: 2023-10-06 — End: 2024-10-05
  Filled 2023-10-06: qty 90, 90d supply, fill #0

## 2023-10-06 MED FILL — MYCOPHENOLATE SODIUM 180 MG TABLET,DELAYED RELEASE: ORAL | 30 days supply | Qty: 180 | Fill #5

## 2023-10-06 MED FILL — MAGNESIUM OXIDE 400 MG (241.3 MG MAGNESIUM) TABLET: ORAL | 60 days supply | Qty: 120 | Fill #3

## 2023-10-06 MED FILL — TACROLIMUS 0.5 MG CAPSULE, IMMEDIATE-RELEASE: ORAL | 30 days supply | Qty: 180 | Fill #6

## 2023-10-09 LAB — VITAMIN D 1,25 DIHYDROXY: VITAMIN D 1,25-DIHYDROXY: 47 pg/mL

## 2023-11-05 ENCOUNTER — Inpatient Hospital Stay: Admit: 2023-11-05 | Discharge: 2023-11-06 | Payer: MEDICARE

## 2023-11-05 NOTE — Unmapped (Signed)
 The [facility] has made a second and final attempt to reach this patient to refill the following medication:mycophenolate and tacrolimus.      We have been unable to leave messages on the following phone numbers: [phone] and have sent a text message to the following phone numbers: [phone] .    Dates contacted: 10/30/23-11/05/23  Last scheduled delivery: 10/06/23    The patient may be at risk of non-compliance with this medication. The patient should call the [facility] at [phone]  Option 4, then Option 4: Infectious Disease, Transplant to refill medication.    Arno Lapidus   [facility]

## 2023-11-06 NOTE — Unmapped (Signed)
 [facility] Specialty and Home Delivery Pharmacy Refill Coordination Note    Specialty Medication(s) to be Shipped:   Transplant: mycophenolate mofetil 180mg  and tacrolimus 0.5mg     Other medication(s) to be shipped:  chlorthalidone 25 MG tablet (HYGROTON)     Lynn Erickson, DOB: Aug 14, 1965  Phone: [phone] (home)       All above HIPAA information was verified with patient.     Was a Nurse, learning disability used for this call? No    Completed refill call assessment today to schedule patient's medication shipment from the [facility] and Home Delivery Pharmacy  [phone]).  All relevant notes have been reviewed.     Specialty medication(s) and dose(s) confirmed: Regimen is correct and unchanged.   Changes to medications: Lynn Erickson reports no changes at this time.  Changes to insurance: No  New side effects reported not previously addressed with a pharmacist or physician: None reported  Questions for the pharmacist: No    Confirmed patient received a Conservation officer, historic buildings and a Surveyor, mining with first shipment. The patient will receive a drug information handout for each medication shipped and additional FDA Medication Guides as required.       DISEASE/MEDICATION-SPECIFIC INFORMATION        N/A    SPECIALTY MEDICATION ADHERENCE     Medication Adherence    Patient reported X missed doses in the last month: 0  Specialty Medication: mycophenolate 180 MG EC tablet (MYFORTIC)  Patient is on additional specialty medications: Yes  Additional Specialty Medications: tacrolimus 0.5 MG capsule (PROGRAF)  Patient Reported Additional Medication X Missed Doses in the Last Month: 0  Patient is on more than two specialty medications: No  Adherence tools used: patient uses a pill box to manage medications              Were doses missed due to medication being on hold? No    mycophenolate 180 MG EC tablet (MYFORTIC): 8 days of medicine on hand   tacrolimus 0.5 MG capsule (PROGRAF): 7 days of medicine on hand       REFERRAL TO PHARMACIST Referral to the pharmacist: Not needed      [facility]     Shipping address confirmed in Epic.     Cost and Payment: Patient has a copay of $12. They are aware and have authorized the pharmacy to charge the credit card on file.    Delivery Scheduled: Yes, Expected medication delivery date: 11/07/23.     Medication will be delivered via Same Day Courier to the prescription address in Epic WAM.    Lynn Erickson   [facility] Specialty and Home Delivery Pharmacy  Specialty Technician

## 2023-11-07 MED FILL — TACROLIMUS 0.5 MG CAPSULE, IMMEDIATE-RELEASE: ORAL | 30 days supply | Qty: 180 | Fill #7

## 2023-11-07 MED FILL — CHLORTHALIDONE 25 MG TABLET: ORAL | 30 days supply | Qty: 30 | Fill #3

## 2023-11-07 MED FILL — MYCOPHENOLATE SODIUM 180 MG TABLET,DELAYED RELEASE: ORAL | 30 days supply | Qty: 180 | Fill #6

## 2023-11-19 DIAGNOSIS — Z940 Kidney transplant status: Principal | ICD-10-CM

## 2023-11-20 ENCOUNTER — Ambulatory Visit: Admit: 2023-11-20 | Discharge: 2023-11-21 | Payer: MEDICAID

## 2023-11-20 LAB — CBC W/ AUTO DIFF
BASOPHILS ABSOLUTE COUNT: 0.1 10*9/L (ref 0.0–0.1)
BASOPHILS RELATIVE PERCENT: 1.7 %
EOSINOPHILS ABSOLUTE COUNT: 0.1 10*9/L (ref 0.0–0.5)
EOSINOPHILS RELATIVE PERCENT: 2 %
HEMATOCRIT: 32.7 % — ABNORMAL LOW (ref 34.0–44.0)
HEMOGLOBIN: 10.3 g/dL — ABNORMAL LOW (ref 11.3–14.9)
LYMPHOCYTES ABSOLUTE COUNT: 1.1 10*9/L (ref 1.1–3.6)
LYMPHOCYTES RELATIVE PERCENT: 21.4 %
MEAN CORPUSCULAR HEMOGLOBIN CONC: 31.6 g/dL — ABNORMAL LOW (ref 32.0–36.0)
MEAN CORPUSCULAR HEMOGLOBIN: 18.8 pg — ABNORMAL LOW (ref 25.9–32.4)
MEAN CORPUSCULAR VOLUME: 59.5 fL — ABNORMAL LOW (ref 77.6–95.7)
MEAN PLATELET VOLUME: 8.5 fL (ref 6.8–10.7)
MONOCYTES ABSOLUTE COUNT: 0.6 10*9/L (ref 0.3–0.8)
MONOCYTES RELATIVE PERCENT: 11.3 %
NEUTROPHILS ABSOLUTE COUNT: 3.4 10*9/L (ref 1.8–7.8)
NEUTROPHILS RELATIVE PERCENT: 63.6 %
NUCLEATED RED BLOOD CELLS: 0 /100{WBCs} (ref <=4)
PLATELET COUNT: 210 10*9/L (ref 150–450)
RED BLOOD CELL COUNT: 5.49 10*12/L — ABNORMAL HIGH (ref 3.95–5.13)
RED CELL DISTRIBUTION WIDTH: 16.2 % — ABNORMAL HIGH (ref 12.2–15.2)
WBC ADJUSTED: 5.3 10*9/L (ref 3.6–11.2)

## 2023-11-20 LAB — URINALYSIS WITH MICROSCOPY
BACTERIA: NONE SEEN /HPF
BILIRUBIN UA: NEGATIVE
BLOOD UA: NEGATIVE
GLUCOSE UA: NEGATIVE
KETONES UA: NEGATIVE
LEUKOCYTE ESTERASE UA: NEGATIVE
NITRITE UA: NEGATIVE
PH UA: 5.5 (ref 5.0–9.0)
PROTEIN UA: NEGATIVE
RBC UA: <1 /HPF (ref <=4)
SPECIFIC GRAVITY UA: 1.016 (ref 1.003–1.030)
SQUAMOUS EPITHELIAL: <1 /HPF (ref 0–5)
UROBILINOGEN UA: <2
WBC UA: <1 /HPF (ref 0–5)

## 2023-11-20 LAB — HEMOGLOBIN A1C
ESTIMATED AVERAGE GLUCOSE: 111 mg/dL
HEMOGLOBIN A1C: 5.5 % (ref 4.8–5.6)

## 2023-11-20 LAB — LIPID PANEL
CHOLESTEROL: 211 mg/dL — ABNORMAL HIGH (ref <200)
HDL CHOLESTEROL: 44 mg/dL — ABNORMAL LOW (ref >50)
LDL CHOLESTEROL CALCULATED: 137 mg/dL — ABNORMAL HIGH (ref <100)
NON-HDL CHOLESTEROL: 167 mg/dL — ABNORMAL HIGH (ref <130)
TRIGLYCERIDES: 171 mg/dL — ABNORMAL HIGH (ref <150)

## 2023-11-20 LAB — ALBUMIN / CREATININE URINE RATIO
ALBUMIN QUANT URINE: 0.5 mg/dL
ALBUMIN/CREATININE RATIO: 4.1 ug/mg (ref 0.0–30.0)
CREATININE, URINE: 121.1 mg/dL

## 2023-11-20 LAB — PROTEIN / CREATININE RATIO, URINE
CREATININE, URINE: 118.8 mg/dL
PROTEIN URINE: 12.8 mg/dL
PROTEIN/CREAT RATIO, URINE: 0.108

## 2023-11-20 LAB — IRON & TIBC
IRON SATURATION (CALC): 53 %
IRON: 107 ug/dL (ref 50–170)
TOTAL IRON BINDING CAPACITY (CALC): 201.6 ug/dL — ABNORMAL LOW (ref 250.0–425.0)
TRANSFERRIN: 160 mg/dL — ABNORMAL LOW (ref 250.0–380.0)

## 2023-11-20 LAB — HEPATIC FUNCTION PANEL
ALBUMIN: 4.3 g/dL (ref 3.4–5.0)
ALKALINE PHOSPHATASE: 105 U/L (ref 46–116)
ALT (SGPT): 9 U/L — ABNORMAL LOW (ref 10–49)
AST (SGOT): 20 U/L (ref <=34)
BILIRUBIN DIRECT: 0.2 mg/dL (ref 0.00–0.30)
BILIRUBIN TOTAL: 0.6 mg/dL (ref 0.3–1.2)
PROTEIN TOTAL: 7.8 g/dL (ref 5.7–8.2)

## 2023-11-20 LAB — MAGNESIUM: MAGNESIUM: 1.7 mg/dL (ref 1.6–2.6)

## 2023-11-20 LAB — FERRITIN: FERRITIN: 1700 ng/mL — ABNORMAL HIGH (ref 7.3–270.7)

## 2023-11-20 LAB — SLIDE REVIEW

## 2023-11-20 LAB — EBV QUANTITATIVE PCR, BLOOD: EBV VIRAL LOAD RESULT: NOT DETECTED

## 2023-11-20 LAB — BK VIRUS QUANTITATIVE PCR, BLOOD: BK BLOOD RESULT: NOT DETECTED

## 2023-11-20 LAB — PHOSPHORUS: PHOSPHORUS: 3.8 mg/dL (ref 2.4–5.1)

## 2023-11-20 LAB — PARATHYROID HORMONE (PTH): PARATHYROID HORMONE INTACT: 148.6 pg/mL — ABNORMAL HIGH (ref 18.4–80.1)

## 2023-11-20 LAB — CMV DNA, QUANTITATIVE, PCR: CMV VIRAL LD: NOT DETECTED

## 2023-11-21 ENCOUNTER — Ambulatory Visit: Admit: 2023-11-21 | Discharge: 2023-11-22 | Payer: MEDICAID | Attending: Nephrology | Primary: Nephrology

## 2023-11-21 DIAGNOSIS — Z940 Kidney transplant status: Principal | ICD-10-CM

## 2023-11-21 DIAGNOSIS — R519 Chronic nonintractable headache, unspecified headache type: Principal | ICD-10-CM

## 2023-11-21 DIAGNOSIS — G8929 Other chronic pain: Principal | ICD-10-CM

## 2023-11-21 DIAGNOSIS — E785 Hyperlipidemia, unspecified: Principal | ICD-10-CM

## 2023-11-21 DIAGNOSIS — R413 Other amnesia: Principal | ICD-10-CM

## 2023-11-21 LAB — BASIC METABOLIC PANEL
ANION GAP: 17 mmol/L — ABNORMAL HIGH (ref 5–14)
BLOOD UREA NITROGEN: 29 mg/dL — ABNORMAL HIGH (ref 9–23)
BUN / CREAT RATIO: 28
CALCIUM: 10.3 mg/dL (ref 8.7–10.4)
CHLORIDE: 106 mmol/L (ref 98–107)
CO2: 22.4 mmol/L (ref 20.0–31.0)
CREATININE: 1.05 mg/dL — ABNORMAL HIGH (ref 0.55–1.02)
EGFR CKD-EPI (2021) FEMALE: 61 mL/min/1.73m2 (ref >=60)
GLUCOSE RANDOM: 94 mg/dL (ref 70–179)
POTASSIUM: 4.1 mmol/L (ref 3.4–4.8)
SODIUM: 145 mmol/L (ref 135–145)

## 2023-11-21 LAB — VITAMIN B12: VITAMIN B-12: 855 pg/mL (ref 211–911)

## 2023-11-21 LAB — TACROLIMUS LEVEL, TROUGH: TACROLIMUS, TROUGH: 6.1 ng/mL (ref 5.0–15.0)

## 2023-11-21 MED ORDER — MYCOPHENOLATE SODIUM 180 MG TABLET,DELAYED RELEASE
ORAL_TABLET | Freq: Two times a day (BID) | ORAL | 11 refills | 30.00000 days | Status: CP
Start: 2023-11-21 — End: 2024-11-20
  Filled 2023-12-10: qty 120, 30d supply, fill #0

## 2023-11-21 MED ORDER — ROSUVASTATIN 10 MG TABLET
ORAL_TABLET | Freq: Every day | ORAL | 3 refills | 90.00000 days | Status: CP
Start: 2023-11-21 — End: 2024-11-20

## 2023-11-21 MED ORDER — PANTOPRAZOLE 40 MG TABLET,DELAYED RELEASE
ORAL_TABLET | Freq: Two times a day (BID) | ORAL | 11 refills | 30.00000 days | Status: CP
Start: 2023-11-21 — End: 2024-11-20

## 2023-11-21 NOTE — Unmapped (Addendum)
 [facility] Pharmacist has reviewed a new prescription for mycophenolate that indicates a dose decrease.  Patient was counseled on this dosage change by coordinator LP- verified per inbasket message from coordinator 5/22  Next refill call date adjusted if necessary.        Clinical Assessment Needed For: Dose Change  Medication: Mycophenolate 180 mg   Last Fill Date/Day Supply: 11/07/23 / 30  Copay $4.00/ 90 days   Was previous dose already scheduled to fill: No    Notes to Pharmacist: n/a

## 2023-11-21 NOTE — Unmapped (Signed)
 [facility]  transplant nephrology clinic visit    assessment and plan  1. kidney transplant 10/12/2016. baseline creatinine 1 mg/dl. no albuminuria. no donor specific hla ab detected '24.   2. immunosuppression. mycophenolate 540mg  bid. tacrolimus 1.5mg  bid; 12hr lvl 4-7 ng/ml.  3. hypertension. blood pressure goal < 130/80 mmhg.   4. hyperlipidemia. rosuvastatin 10mg  daily.  5. preventive medicine. influenza '19. pcv13 pneumococcal '17. ppsv23 pneumococcal '19. zoster '19. covid-19. mammogram '19. gyn exam/pap smear '19. kidney ultrasound '22. colonoscopy '16 with 5 year follow up recommended.    history of present illness    [name] is a 58 year old woman seen in follow up post kidney transplant 10/12/2016.     past medical hx:  1. 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 baseline creatinine 1 mg/dl.  2. systemic lupus erythematosus   3. hypertension  4. hyperlipidemia  5. secondary/tertiary hyperparathyroidism  6. beta thalassemia trait    past surgical hx: left upper ext av graft x2 '07-08/avg ligation '19. bilateral tubal ligation. kidney transplant '18.     allergies: penicillin vancomycin aspirin shellfish hydroxychloroquine.     medications: tacrolimus 1.5mg  bid, mycophenolate 540mg  bid, chlorthalidone 25mg  daily, rosuvastatin 10mg  daily, cinacalcet 30mg  daily, mg oxide 400mg  bid.    soc hx: widowed x4 children. no smoking hx     physical exam: t96.3 p90 bp118/77 wt58.6kg bmi 30. wd/wn woman appropriate affect and mood. sclera anicteric. mmm no thrush. neck supple no palpable ln. heart rrr nl s1s2 no m/r/g. lungs clear bilateral. abd soft nt/nd. no lower ext edema. msk no synovitis or tophi. skin no rash. neuro alert oriented non focal exam.

## 2023-11-24 LAB — ALLOSURE KIDNEY: ALLOSURE: 0.06 %

## 2023-11-24 LAB — SYPHILIS SCREEN: SYPHILIS RPR SCREEN: NONREACTIVE

## 2023-11-25 LAB — VITAMIN D 1,25 DIHYDROXY: VITAMIN D 1,25-DIHYDROXY: 41 pg/mL

## 2023-11-25 LAB — VITAMIN D 25 HYDROXY: VITAMIN D, TOTAL (25OH): 18 ng/mL — ABNORMAL LOW (ref 20.0–80.0)

## 2023-11-26 ENCOUNTER — Inpatient Hospital Stay: Admit: 2023-11-26 | Discharge: 2023-11-27 | Payer: MEDICAID

## 2023-11-26 LAB — ALLOSURE KIDNEY: ALLOSURE: 0.06 %

## 2023-11-27 NOTE — Unmapped (Addendum)
 Transplant Coordinator, Clinic Visit   Pt seen today by transplant nephrology for follow up, reviewed medications and symptoms.          11/21/23 1039 11/21/23 1043   BP: 145/74 118/77   Pulse: 90 79   Temp: 35.7 ??C (96.3 ??F)    Weight: 58.6 kg (129 lb 3.2 oz)    Height: 139.7 cm (4' 7)    PainSc: 6     PainLoc: Head        Assessment  BP @ Home: Not checking, will start  BG: N/A  HA/Dizziness/Lightheaded: No  Hand tremors: No  Numbness/tingling: No  Fevers/Chills/sweats: No  Chest Pain/SOB: No  N/V/Heartburn: No  Diarrhea/constipation: No  UTI symptoms: No  Swelling: No  Pain: 0  Incision/Drain/Foley: N/A    Good appetite; reports adequate hydration - Will increase at least 80 oz daily    Any new medications? No  Immunosuppressant last taken: 11PM    Immunization status: Declined    Plan:  - Restart Crestor 10 mg daily, pantoprazole 40 mg BID, will not start aspirin 81 mg daily d/t allergy  - Decrease mycophenolate 360 mg BID, now 7 yrs out  - Ordered Brain MRI, Carotid dopplers, referral to PCP, referral to neuro and gastro  - Weight loss due to pain with eating, pain with taking meds, nausea and diarrhea w/ each meal, only eating maybe once daily - will likely need EGD, colonoscopy - sent referral to [facility] and Martine Sleek to hopefully get patient in sooner.   - Frequent HA causing bilateral facial droop, numbness, tingling effecting arm and leg on same side. Pt reports worsening ongoing memory loss for 1 year.  - Encouraged monthly labs and follow up with referrals, daughter agreeable to help with appointment management

## 2023-11-28 LAB — HLA DS POST TRANSPLANT
ANTI-DONOR DRW #1 MFI: 0 MFI
ANTI-DONOR HLA-A #1 MFI: 272 MFI
ANTI-DONOR HLA-A #2 MFI: 0 MFI
ANTI-DONOR HLA-B #1 MFI: 0 MFI
ANTI-DONOR HLA-B #2 MFI: 0 MFI
ANTI-DONOR HLA-C #2 MFI: 0 MFI
ANTI-DONOR HLA-DP AG #1 MFI: 799 MFI
ANTI-DONOR HLA-DQB #1 MFI: 0 MFI
ANTI-DONOR HLA-DQB #2 MFI: 0 MFI
ANTI-DONOR HLA-DR #2 MFI: 0 MFI

## 2023-11-28 LAB — FSAB CLASS 2 ANTIBODY SPECIFICITY: HLA CL2 AB RESULT: NEGATIVE

## 2023-11-28 LAB — FSAB CLASS 1 ANTIBODY SPECIFICITY: HLA CLASS 1 ANTIBODY RESULT: NEGATIVE

## 2023-12-09 NOTE — Unmapped (Signed)
 [facility] Specialty and Home Delivery Pharmacy Refill Coordination Note    Specialty Medication(s) to be Shipped:   Transplant: mycophenolate mofetil 180 mg and tacrolimus 0.5 mg    Other medication(s) to be shipped: chlorthalidone, magnesium      Lynn Erickson, DOB: Oct 16, 1965  Phone: [phone] (home)       All above HIPAA information was verified with patient.     Was a Nurse, learning disability used for this call? No    Completed refill call assessment today to schedule patient's medication shipment from the [facility] and Home Delivery Pharmacy  [phone]).  All relevant notes have been reviewed.     Specialty medication(s) and dose(s) confirmed: Regimen is correct and unchanged.   Changes to medications: Corley reports no changes at this time.  Changes to insurance: No  New side effects reported not previously addressed with a pharmacist or physician: None reported  Questions for the pharmacist: No    Confirmed patient received a Conservation officer, historic buildings and a Surveyor, mining with first shipment. The patient will receive a drug information handout for each medication shipped and additional FDA Medication Guides as required.       DISEASE/MEDICATION-SPECIFIC INFORMATION        N/A    SPECIALTY MEDICATION ADHERENCE     Medication Adherence    Patient reported X missed doses in the last month: 0  Specialty Medication: tacrolimus 0.5 MG capsule (PROGRAF)  Patient is on additional specialty medications: Yes  Additional Specialty Medications: mycophenolate 180 MG EC tablet (MYFORTIC)  Patient Reported Additional Medication X Missed Doses in the Last Month: 0  Patient is on more than two specialty medications: No  Adherence tools used: patient uses a pill box to manage medications              Were doses missed due to medication being on hold? No    mycophenolate 180 MG EC tablet (MYFORTIC) 11 days of medicine on hand   tacrolimus 0.5 MG capsule (PROGRAF) 9 days of medicine on hand         REFERRAL TO PHARMACIST     Referral to the pharmacist: Not needed      [facility]     Shipping address confirmed in Epic.     Cost and Payment: Patient has a copay of $15.18 . They are aware and have authorized the pharmacy to charge the credit card on file.    Delivery Scheduled: Yes, Expected medication delivery date: 12/10/2023.     Medication will be delivered via Same Day Courier to the prescription address in Epic WAM.    [facility]  Specialty Technician

## 2023-12-10 MED FILL — TACROLIMUS 0.5 MG CAPSULE, IMMEDIATE-RELEASE: ORAL | 30 days supply | Qty: 180 | Fill #8

## 2023-12-10 MED FILL — CHLORTHALIDONE 25 MG TABLET: ORAL | 30 days supply | Qty: 30 | Fill #4

## 2023-12-10 MED FILL — MAGNESIUM OXIDE 400 MG (241.3 MG MAGNESIUM) TABLET: ORAL | 60 days supply | Qty: 120 | Fill #4

## 2023-12-31 NOTE — Unmapped (Signed)
 [facility] Specialty and Home Delivery Pharmacy Refill Coordination Note    Specialty Medication(s) to be Shipped:   Transplant: mycophenolate  mofetil 180 mg and tacrolimus  0.5 mg    Other medication(s) to be shipped: chlorthalidone , cinacalcet , magnesium       Lynn Erickson, DOB: September 29, 1965  Phone: [phone] (home)       All above HIPAA information was verified with patient.     Was a Nurse, learning disability used for this call? No    Completed refill call assessment today to schedule patient's medication shipment from the [facility] and Home Delivery Pharmacy  [phone]).  All relevant notes have been reviewed.     Specialty medication(s) and dose(s) confirmed: Regimen is correct and unchanged.   Changes to medications: Lynn Erickson reports no changes at this time.  Changes to insurance: No  New side effects reported not previously addressed with a pharmacist or physician: None reported  Questions for the pharmacist: No    Confirmed patient received a Conservation officer, historic buildings and a Surveyor, mining with first shipment. The patient will receive a drug information handout for each medication shipped and additional FDA Medication Guides as required.       DISEASE/MEDICATION-SPECIFIC INFORMATION        N/A    SPECIALTY MEDICATION ADHERENCE     Medication Adherence    Patient reported X missed doses in the last month: 0  Specialty Medication: tacrolimus  0.5 MG capsule (PROGRAF )  Patient is on additional specialty medications: Yes  Additional Specialty Medications: mycophenolate  180 MG EC tablet (MYFORTIC )  Patient Reported Additional Medication X Missed Doses in the Last Month: 0  Patient is on more than two specialty medications: No  Adherence tools used: patient uses a pill box to manage medications              Were doses missed due to medication being on hold? No    mycophenolate  180 MG EC tablet (MYFORTIC )  7 days of medicine on hand   tacrolimus  0.5 MG capsule (PROGRAF ) 7 days of medicine on hand       REFERRAL TO PHARMACIST Referral to the pharmacist: Not needed      [facility]     Shipping address confirmed in Epic.     Cost and Payment: Patient has a copay of $19.18. They are aware and have authorized the pharmacy to charge the credit card on file.    Delivery Scheduled: Yes, Expected medication delivery date: 01/06/2024.     Medication will be delivered via Same Day Courier to the prescription address in Epic WAM.    [facility] and [facility] Delivery Pharmacy  Specialty Technician

## 2024-01-06 MED FILL — CINACALCET 30 MG TABLET: ORAL | 90 days supply | Qty: 90 | Fill #1

## 2024-01-06 MED FILL — MAGNESIUM OXIDE 400 MG (241.3 MG MAGNESIUM) TABLET: ORAL | 60 days supply | Qty: 120 | Fill #5

## 2024-01-06 MED FILL — CHLORTHALIDONE 25 MG TABLET: ORAL | 90 days supply | Qty: 90 | Fill #5

## 2024-01-06 MED FILL — TACROLIMUS 0.5 MG CAPSULE, IMMEDIATE-RELEASE: ORAL | 30 days supply | Qty: 180 | Fill #9

## 2024-01-06 MED FILL — MYCOPHENOLATE SODIUM 180 MG TABLET,DELAYED RELEASE: ORAL | 30 days supply | Qty: 120 | Fill #1

## 2024-02-03 MED ORDER — MAGNESIUM OXIDE 400 MG (241.3 MG MAGNESIUM) TABLET
ORAL_TABLET | Freq: Two times a day (BID) | ORAL | 11 refills | 30.00000 days | Status: CN
Start: 2024-02-03 — End: ?

## 2024-02-03 NOTE — Unmapped (Signed)
 This patient is on a tacrolimus  a narrow therapeutic index (NTI) medication. The NDC used for the last fill on 01/06/24 is currently unavailable due to backorder. I have received approval from the patient's prescriber that we may substitute with an available NDC. I have informed the patient of this change and instructed them to follow-up with their provider as needed. Ronal Cedars, CPP is aware of manufacturer switch.       Approximate time spent: 0-5 minutes    Harlene Gal, PharmD, Clinical Specialty Pharmacist  [facility] Specialty and Home Delivery Pharmacy

## 2024-02-03 NOTE — Unmapped (Addendum)
 [facility] Specialty and Home Delivery Pharmacy Refill Coordination Note    Specialty Medication(s) to be Shipped:   Transplant: mycophenolate  mofetil 180 mg and tacrolimus  1mg     Other medication(s) to be shipped: No additional medications requested for fill at this time     Lynn Erickson, DOB: [birth year]  Phone: [phone] (home)       All above HIPAA information was verified with patient.     Was a Nurse, learning disability used for this call? No    Completed refill call assessment today to schedule patient's medication shipment from the [facility] and Home Delivery Pharmacy  [phone]).  All relevant notes have been reviewed.     Specialty medication(s) and dose(s) confirmed: Regimen is correct and unchanged.   Changes to medications: Reiko reports no changes at this time.  Changes to insurance: No  New side effects reported not previously addressed with a pharmacist or physician: None reported  Questions for the pharmacist: No    Confirmed patient received a Conservation officer, historic buildings and a Surveyor, mining with first shipment. The patient will receive a drug information handout for each medication shipped and additional FDA Medication Guides as required.       DISEASE/MEDICATION-SPECIFIC INFORMATION        N/A    SPECIALTY MEDICATION ADHERENCE     Medication Adherence    Patient reported X missed doses in the last month: 0  Specialty Medication: tacrolimus  0.5 MG capsule (PROGRAF )  Patient is on additional specialty medications: Yes  Additional Specialty Medications: mycophenolate  180 MG EC tablet (MYFORTIC )  Patient Reported Additional Medication X Missed Doses in the Last Month: 0  Patient is on more than two specialty medications: No  Adherence tools used: patient uses a pill box to manage medications              Were doses missed due to medication being on hold? No    tacrolimus  0.5 MG capsule (PROGRAF )  19 days of medicine on hand   mycophenolate  180 MG EC tablet (MYFORTIC ) 20 days of medicine on hand       REFERRAL TO PHARMACIST     Referral to the pharmacist: Not needed      [facility]     Shipping address confirmed in Epic.     Cost and Payment: Patient has a copay of $8.00 . They are aware and have authorized the pharmacy to charge the credit card on file.    Delivery Scheduled: Yes, Expected medication delivery date: 02/10/2024.     Medication will be delivered via Same Day Courier to the prescription address in Epic WAM.    [name]  Specialty Technician

## 2024-02-05 ENCOUNTER — Inpatient Hospital Stay: Admit: 2024-02-05 | Discharge: 2024-02-06 | Payer: MEDICAID

## 2024-02-05 MED ADMIN — gadopiclenol (ELUCIREM,VUEWAY) injection 5.8 mL: 5.8 mL | INTRAVENOUS | @ 21:00:00 | Stop: 2024-02-05

## 2024-02-10 MED FILL — MYCOPHENOLATE SODIUM 180 MG TABLET,DELAYED RELEASE: ORAL | 30 days supply | Qty: 120 | Fill #2

## 2024-02-10 MED FILL — TACROLIMUS 0.5 MG CAPSULE, IMMEDIATE-RELEASE: ORAL | 30 days supply | Qty: 180 | Fill #10

## 2024-03-12 NOTE — Unmapped (Signed)
 [facility] has made a second and final attempt to reach this patient to refill the following medication:mycophenolate  180 MG EC tablet (MYFORTIC ) and tacrolimus  0.5 MG capsule (PROGRAF ).      We have left voicemails on the following phone numbers: [phone], have sent a MyChart message, and have sent a text message to the following phone numbers: [phone].    Dates contacted: 03/02/2024 - 03/12/2024   Last scheduled delivery: 02/10/2024     The patient may be at risk of non-compliance with this medication. The patient should call the [facility] at [phone]  Option 4, then Option 4: Infectious Disease, Transplant to refill medication.    Nelida Guan   [facility] Specialty and [facility]

## 2024-03-19 MED ORDER — MAGNESIUM OXIDE 400 MG (241.3 MG MAGNESIUM) TABLET
ORAL_TABLET | Freq: Two times a day (BID) | ORAL | 11 refills | 30.00000 days
Start: 2024-03-19 — End: ?

## 2024-03-19 NOTE — Unmapped (Addendum)
 [facility] Specialty and Home Delivery Pharmacy Clinical Assessment & Refill Coordination Note    Lynn Erickson, DOB: 1965/09/22  Phone: [phone] (home)     All above HIPAA information was verified with patient.     Was a Nurse, learning disability used for this call? No    Specialty Medication(s):   Transplant: mycophenolic acid 180mg  and tacrolimus  0.5mg      Current Medications[1]     Changes to medications: Atisha reports no changes at this time.    Medication list has been reviewed and updated in Epic: Yes  No high priority alerts noted on med rec today    Allergies[2]    Changes to allergies: No    Allergies have been reviewed and updated in Epic: Yes    SPECIALTY MEDICATION ADHERENCE     Tacrolimus  0.5mg   : 4 days of medicine on hand   Mycophenolate  180mg   : 3 days of medicine on hand       Medication Adherence    Patient reported X missed doses in the last month: 0  Specialty Medication: tacrolimus  0.5mg   Patient is on additional specialty medications: Yes  Additional Specialty Medications: Mycophenolate  180mg   Patient Reported Additional Medication X Missed Doses in the Last Month: 0  Patient is on more than two specialty medications: No  Adherence tools used: patient uses a pill box to manage medications          Specialty medication(s) dose(s) confirmed: Regimen is correct and unchanged.     Are there any concerns with adherence? No    Adherence counseling provided? Not needed    CLINICAL MANAGEMENT AND INTERVENTION      Clinical Benefit Assessment:    Do you feel the medicine is effective or helping your condition? Yes    Clinical Benefit counseling provided? Not needed    Adverse Effects Assessment:    Are you experiencing any side effects? No    Are you experiencing difficulty administering your medicine? No    Quality of Life Assessment:    Quality of Life    Rheumatology  Oncology  Dermatology  Cystic Fibrosis          How many days over the past month did your transplant  keep you from your normal activities? For example, brushing your teeth or getting up in the morning. 0    Have you discussed this with your provider? Not needed    Acute Infection Status:    Acute infections noted within Epic:  No active infections    Patient reported infection: None    Therapy Appropriateness:    Is therapy appropriate based on current medication list, adverse reactions, adherence, clinical benefit and progress toward achieving therapeutic goals? Yes, therapy is appropriate and should be continued     Clinical Intervention:    Was an intervention completed as part of this clinical assessment? No    DISEASE/MEDICATION-SPECIFIC INFORMATION      N/A    Solid Organ Transplant: Not Applicable    PATIENT SPECIFIC NEEDS     Does the patient have any physical, cognitive, or cultural barriers? No    Is the patient high risk? Yes, patient is taking a REMS drug. Medication is dispensed in compliance with REMS program    Does the patient require physician intervention or other additional services (i.e., nutrition, smoking cessation, social work)? No    Does the patient have an additional or emergency contact listed in their chart? Yes    SOCIAL DETERMINANTS OF HEALTH  At the [facility], we have learned that life circumstances - like trouble affording food, housing, utilities, or transportation can affect the health of many of our patients.   That is why we wanted to ask: are you currently experiencing any life circumstances that are negatively impacting your health and/or quality of life? Patient declined to answer    Social Drivers of Health     Food Insecurity: Not on file   Tobacco Use: Low Risk  (08/13/2021)    Patient History     Smoking Tobacco Use: Never     Smokeless Tobacco Use: Never     Passive Exposure: Not on file   Transportation Needs: Unmet Transportation Needs (09/06/2021)    PRAPARE - Transportation     Lack of Transportation (Medical): Yes     Lack of Transportation (Non-Medical): Yes   Alcohol Use: Not At Risk (07/22/2017) Received from [facility]    AUDIT-C     Frequency of Alcohol Consumption: Never     Average Number of Drinks: Not on file     Frequency of Binge Drinking: Not on file   Housing: Not on file   Physical Activity: Not on file   Utilities: Not on file   Stress: Not on file   Interpersonal Safety: Not on file   Substance Use: Not on file (05/13/2023)   Intimate Partner Violence: Not on file   Social Connections: Not on file   Financial Resource Strain: Not on file   Health Literacy: Not on file   Internet Connectivity: Not on file       Would you be willing to receive help with any of the needs that you have identified today? Not applicable       SHIPPING     Specialty Medication(s) to be Shipped:   Transplant: mycophenolic acid 180mg  and tacrolimus  0.5mg     Other medication(s) to be shipped: pantoprazole , rosuvastatin , magnesium     Specialty Medications not needed at this time: N/A     Changes to insurance: No    Cost and Payment: Patient has a copay of $4 tac/myco/pantop/rosuva, $3.18 magox. They are aware and have authorized the pharmacy to charge the credit card on file.    Delivery Scheduled: Yes, Expected medication delivery date: 03/22/2024.  However, Rx request for refills was sent to the provider as there are none remaining.     Medication will be delivered via Same Day Courier to the confirmed prescription address in [facility].  Note patient change rx delivery address today - now to the [place] address- this was updated in wam.    The patient will receive a drug information handout for each medication shipped and additional FDA Medication Guides as required.  Verified that patient has previously received a Conservation officer, historic buildings and a Surveyor, mining.    The patient or caregiver noted above participated in the development of this care plan and knows that they can request review of or adjustments to the care plan at any time.      All of the patient's questions and concerns have been addressed.    [name], PharmD   [facility] Specialty and Home Delivery Pharmacy Specialty Pharmacist         [1]   Current Outpatient Medications   Medication Sig Dispense Refill    chlorthalidone  (HYGROTON ) 25 MG tablet Take 1 tablet (25 mg total) by mouth every morning. 30 tablet 11    cinacalcet  (SENSIPAR ) 30 MG tablet  Take 1 tablet (30 mg total) by mouth daily. 90 tablet 3    magnesium  oxide (MAG-OX) 400 mg (241.3 mg elemental magnesium ) tablet Take 1 tablet (400 mg total) by mouth Two (2) times a day. 60 tablet 11    mycophenolate  (MYFORTIC ) 180 MG EC tablet Take 2 tablets (360 mg total) by mouth two (2) times a day. 120 tablet 11    pantoprazole  (PROTONIX ) 40 MG tablet Take 1 tablet (40 mg total) by mouth two (2) times a day. 60 tablet 11    rosuvastatin  (CRESTOR ) 10 MG tablet Take 1 tablet (10 mg total) by mouth daily. 90 tablet 3    tacrolimus  (PROGRAF ) 0.5 MG capsule Take 3 capsules (1.5 mg total) by mouth two (2) times a day 180 capsule 11     No current facility-administered medications for this visit.   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

## 2024-03-22 DIAGNOSIS — Z940 Kidney transplant status: Principal | ICD-10-CM

## 2024-03-22 MED ORDER — MAGNESIUM OXIDE 400 MG (241.3 MG MAGNESIUM) TABLET
ORAL_TABLET | Freq: Two times a day (BID) | ORAL | 5 refills | 60.00000 days | Status: CP
Start: 2024-03-22 — End: ?
  Filled 2024-03-22: qty 120, 60d supply, fill #0

## 2024-03-22 MED ORDER — TACROLIMUS 0.5 MG CAPSULE, IMMEDIATE-RELEASE
ORAL_CAPSULE | Freq: Two times a day (BID) | ORAL | 11 refills | 30.00000 days | Status: CP
Start: 2024-03-22 — End: ?
  Filled 2024-03-22: qty 180, 30d supply, fill #0

## 2024-03-22 MED FILL — PANTOPRAZOLE 40 MG TABLET,DELAYED RELEASE: ORAL | 30 days supply | Qty: 60 | Fill #0

## 2024-03-22 MED FILL — MYCOPHENOLATE SODIUM 180 MG TABLET,DELAYED RELEASE: ORAL | 30 days supply | Qty: 120 | Fill #3

## 2024-03-22 MED FILL — ROSUVASTATIN 10 MG TABLET: ORAL | 90 days supply | Qty: 90 | Fill #0

## 2024-04-14 NOTE — Unmapped (Signed)
 Lynn Erickson has been contacted in regards to their refill of tacrolimus  0.5 MG capsule (PROGRAF ) and mycophenolate  180 MG EC tablet (MYFORTIC ). At this time, they have declined refill due to pt have more than 2 weeks supply on hand . Refill assessment call date has been updated per the patient's request.

## 2024-04-15 MED FILL — CINACALCET 30 MG TABLET: ORAL | 90 days supply | Qty: 90 | Fill #2

## 2024-04-22 DIAGNOSIS — Z940 Kidney transplant status: Principal | ICD-10-CM

## 2024-04-23 NOTE — Unmapped (Signed)
 [facility] Specialty and Home Delivery Pharmacy Refill Coordination Note    Specialty Medication(s) to be Shipped:   Transplant: mycophenolate  mofetil 180mg  and tacrolimus  0.5mg     Other medication(s) to be shipped: No additional medications requested for fill at this time    Specialty Medications not needed at this time: N/A     Lynn Erickson, DOB: 1966-05-22  Phone: [phone] (home)       All above HIPAA information was verified with patient.     Was a Nurse, learning disability used for this call? No    Completed refill call assessment today to schedule patient's medication shipment from the [facility] and Home Delivery Pharmacy  [phone]).  All relevant notes have been reviewed.     Specialty medication(s) and dose(s) confirmed: Regimen is correct and unchanged.   Changes to medications: Wafaa reports no changes at this time.  Changes to insurance: No  New side effects reported not previously addressed with a pharmacist or physician: None reported  Questions for the pharmacist: No    Confirmed patient received a Conservation officer, historic buildings and a Surveyor, mining with first shipment. The patient will receive a drug information handout for each medication shipped and additional FDA Medication Guides as required.       DISEASE/MEDICATION-SPECIFIC INFORMATION        N/A    SPECIALTY MEDICATION ADHERENCE     Medication Adherence    Patient reported X missed doses in the last month: 0  Specialty Medication: tacrolimus  0.5 MG capsule (PROGRAF )  Patient is on additional specialty medications: Yes  Additional Specialty Medications: mycophenolate  180 MG EC tablet (MYFORTIC )  Patient Reported Additional Medication X Missed Doses in the Last Month: 0  Patient is on more than two specialty medications: No  Any gaps in refill history greater than 2 weeks in the last 3 months: no  Demonstrates understanding of importance of adherence: yes  Adherence tools used: patient uses a pill box to manage medications              Were doses missed due to medication being on hold? No    mycophenolate  180   mg: 3 days of medicine on hand   tacrolimus  0.5 mg: 3 days of medicine on hand     REFERRAL TO PHARMACIST     Referral to the pharmacist: Not needed      [facility]     Shipping address confirmed in Epic.     Cost and Payment: Patient has a copay of $8.00. They are aware and have authorized the pharmacy to charge the credit card on file.    Delivery Scheduled: Yes, Expected medication delivery date: 04/26/24.     Medication will be delivered via Same Day Courier to the prescription address in Epic WAM.    Kyra Myron   [facility] Specialty and Home Delivery Pharmacy  Specialty Technician

## 2024-04-26 MED FILL — MYCOPHENOLATE SODIUM 180 MG TABLET,DELAYED RELEASE: ORAL | 30 days supply | Qty: 120 | Fill #4

## 2024-04-26 MED FILL — TACROLIMUS 0.5 MG CAPSULE, IMMEDIATE-RELEASE: ORAL | 30 days supply | Qty: 180 | Fill #1

## 2024-05-21 NOTE — Progress Notes (Signed)
 [facility] has made a second and final attempt to reach this patient to refill the following medication:tacrolimus  0.5 MG capsule (PROGRAF ), and mycophenolate  180 MG EC tablet (MYFORTIC ).      We have been unable to leave messages on the following phone numbers: [phone], have sent a MyChart message, and have sent a text message to the following phone numbers: [phone].    Dates contacted: 05/18/2024- 05/21/2024  Last scheduled delivery: 04/26/2024    The patient may be at risk of non-compliance with this medication. The patient should call the [facility] at [phone]  Option 4, then Option 4: Infectious Disease, Transplant to refill medication.    Nelida Guan   [facility] Specialty and [facility]

## 2024-05-24 NOTE — Progress Notes (Signed)
 [facility] Refill Coordination Note    Specialty Medication(s) to be Shipped:   Transplant: tacrolimus  1mg     Other medication(s) to be shipped: No additional medications requested for fill at this time    Specialty Medications not needed at this time: Transplant: mycophenolic acid 180mg      Lynn Erickson, DOB: [birth year]  Phone: [phone] (home)       All above HIPAA information was verified with patient.     Was a nurse, learning disability used for this call? No    Completed refill call assessment today to schedule patient's medication shipment from the [facility] Delivery [facility]  [phone]).  All relevant notes have been reviewed.     Specialty medication(s) and dose(s) confirmed: Regimen is correct and unchanged.   Changes to medications: Trianna reports no changes at this time.  Changes to insurance: No  New side effects reported not previously addressed with a pharmacist or physician: None reported  Questions for the pharmacist: No    Confirmed patient received a Conservation Officer, Historic Buildings and a Surveyor, Mining with first shipment. The patient will receive a drug information handout for each medication shipped and additional FDA Medication Guides as required.       DISEASE/MEDICATION-SPECIFIC INFORMATION        N/A    SPECIALTY MEDICATION ADHERENCE     Medication Adherence    Patient reported X missed doses in the last month: 0  Specialty Medication: tacrolimus  0.5 MG capsule (PROGRAF )  Patient is on additional specialty medications: No  Patient is on more than two specialty medications: No  Adherence tools used: patient uses a pill box to manage medications              Were doses missed due to medication being on hold? No     tacrolimus  0.5 MG capsule (PROGRAF ): 2 days of medicine on hand       REFERRAL TO PHARMACIST     Referral to the pharmacist: Not needed      [facility]     Shipping address confirmed in Epic.     Cost and Payment: Patient has a copay of $4. They are aware and have authorized the pharmacy to charge the credit card on file.    Delivery Scheduled: Yes, Expected medication delivery date: 05/26/2024.     Medication will be delivered via Next Day Courier to the prescription address in Epic WAM.    Lynn Erickson   [facility]  Specialty Technician

## 2024-05-25 MED FILL — TACROLIMUS 0.5 MG CAPSULE, IMMEDIATE-RELEASE: ORAL | 30 days supply | Qty: 180 | Fill #2

## 2024-06-07 NOTE — Progress Notes (Signed)
 The [facility] has made a second and final attempt to reach this patient to refill the following medication:mycophenolate  180 MG EC tablet (MYFORTIC ).      We have been unable to leave messages on the following phone numbers: [phone], have sent a MyChart message, and have sent a text message to the following phone numbers: [phone].    Dates contacted: 05/31/2024- 06/07/2024   Last scheduled delivery: 04/26/2024     The patient may be at risk of non-compliance with this medication. The patient should call the [facility] at [phone]  Option 4, then Option 4: Infectious Disease, Transplant to refill medication.    Nelida Guan   [facility] and [facility]

## 2024-06-09 NOTE — Progress Notes (Signed)
 [facility] Delivery [facility] Refill Coordination Note    Specialty Medication(s) to be Shipped:   Transplant: mycophenolate  mofetil 180mg     Other medication(s) to be shipped: magnesium  400mg     Specialty Medications not needed at this time: Transplant: tacrolimus  0.5mg  rts 12/11 pt want this later      Lynn Erickson, DOB: [birth year]  Phone: [phone] (home)       All above HIPAA information was verified with patient.     Was a nurse, learning disability used for this call? No    Completed refill call assessment today to schedule patient's medication shipment from the [facility] Delivery Pharm[facility]  [phone]).  All relevant notes have been reviewed.     Specialty medication(s) and dose(s) confirmed: Regimen is correct and unchanged.   Changes to medications: Lynn Erickson reports no changes at this time.  Changes to insurance: No  New side effects reported not previously addressed with a pharmacist or physician: None reported  Questions for the pharmacist: No    Confirmed patient received a Conservation Officer, Historic Buildings and a Surveyor, Mining with first shipment. The patient will receive a drug information handout for each medication shipped and additional FDA Medication Guides as required.       DISEASE/MEDICATION-SPECIFIC INFORMATION        N/A    SPECIALTY MEDICATION ADHERENCE     Medication Adherence    Specialty Medication: mycophenolate  180 MG EC tablet (MYFORTIC )  Patient is on additional specialty medications: Yes  Adherence tools used: patient uses a pill box to manage medications              Were doses missed due to medication being on hold? No      mycophenolate  180 MG EC tablet (MYFORTIC ): 5 days of medicine on hand     REFERRAL TO PHARMACIST     Referral to the pharmacist: Not needed      [facility]     Shipping address confirmed in Epic.     Cost and Payment: Patient has a copay of $7.18. They are aware and have authorized the pharmacy to charge the credit card on file.    Delivery Scheduled: Yes, Expected medication delivery date: 06/11/24.     Medication will be delivered via Next Day Courier to the prescription address in Epic WAM.    [facility] Pharm[facility]  Specialty Technician

## 2024-06-09 NOTE — Progress Notes (Signed)
 The [facility] has made a third and final attempt to reach this patient to refill the following medication:mycophenolate .      We have been unable to leave messages on the following phone numbers: [phone], have sent a MyChart message, and have sent a text message to the following phone numbers: [phone].    Dates contacted: 11/24, 12/1, 12/3  Last scheduled delivery: 10/20 for 30ds    The patient may be at risk of non-compliance with this medication. The patient should call the [facility] at [phone]  Option 4, then Option 4: Infectious Disease, Transplant to refill medication.    [name], PharmD   [facility] and Home Delivery Pharm[facility] Specialty Pharmacist

## 2024-06-10 MED FILL — MYCOPHENOLATE SODIUM 180 MG TABLET,DELAYED RELEASE: ORAL | 30 days supply | Qty: 120 | Fill #5

## 2024-06-10 MED FILL — MAGNESIUM OXIDE 400 MG (241.3 MG MAGNESIUM) TABLET: ORAL | 60 days supply | Qty: 120 | Fill #1

## 2024-06-14 NOTE — Progress Notes (Signed)
 [facility] Delivery Pharm[facility] Refill Coordination Note    Specialty Medication(s) to be Shipped:   Transplant: tacrolimus  0.5 mg    Other medication(s) to be shipped: No additional medications requested for fill at this time    Specialty Medications not needed at this time: N/A     Lynn Erickson, DOB: 1966/03/29  Phone: [phone] (home)       All above HIPAA information was verified with patient.     Was a nurse, learning disability used for this call? No    Completed refill call assessment today to schedule patient's medication shipment from the [facility] Delivery Pharmacy  [phone]).  All relevant notes have been reviewed.     Specialty medication(s) and dose(s) confirmed: Regimen is correct and unchanged.   Changes to medications: Lynn Erickson reports no changes at this time.  Changes to insurance: No  New side effects reported not previously addressed with a pharmacist or physician: None reported  Questions for the pharmacist: No    Confirmed patient received a Conservation Officer, Historic Buildings and a Surveyor, Mining with first shipment. The patient will receive a drug information handout for each medication shipped and additional FDA Medication Guides as required.       DISEASE/MEDICATION-SPECIFIC INFORMATION        N/A    SPECIALTY MEDICATION ADHERENCE     Medication Adherence    Patient reported X missed doses in the last month: 0  Specialty Medication: tacrolimus  0.5 MG capsule (PROGRAF )  Patient is on additional specialty medications: No  Adherence tools used: patient uses a pill box to manage medications              Were doses missed due to medication being on hold? No    tacrolimus  0.5 MG capsule (PROGRAF )  13 days of medicine on hand       REFERRAL TO PHARMACIST     Referral to the pharmacist: Not needed      [facility]     Shipping address confirmed in Epic.     Cost and Payment: Patient has a copay of $4.00 . They are aware and have authorized the pharmacy to charge the credit card on file.    Delivery Scheduled: Yes, Expected medication delivery date: 06/18/2024 .     Medication will be delivered via Next Day Courier to the prescription address in Epic WAM.    [name]  Specialty Technician

## 2024-06-17 MED FILL — TACROLIMUS 0.5 MG CAPSULE, IMMEDIATE-RELEASE: ORAL | 30 days supply | Qty: 180 | Fill #3

## 2024-07-16 DIAGNOSIS — Z940 Kidney transplant status: Principal | ICD-10-CM

## 2024-07-16 MED ORDER — CHLORTHALIDONE 25 MG TABLET
ORAL_TABLET | Freq: Every morning | ORAL | 11 refills | 30.00000 days | Status: CP
Start: 2024-07-16 — End: ?
  Filled 2024-07-19: qty 90, 90d supply, fill #0

## 2024-07-16 NOTE — Progress Notes (Signed)
 In error

## 2024-07-16 NOTE — Progress Notes (Addendum)
 [facility] Delivery Pharm[facility] Clinical Assessment & Refill Coordination Note    Lynn Erickson, DOB: [birth year]  Phone: [phone] (home)     All above HIPAA information was verified with patient.     Was a nurse, learning disability used for this call? No    Specialty Medication(s):   Transplant: mycophenolic acid 180mg  and tacrolimus  0.5mg      Current Medications[1]     Changes to medications: Lynn Erickson reports no changes at this time.    Medication list has been reviewed and updated in Epic: Yes  No high priority alerts noted on med rec today    Allergies[2]    Changes to allergies: No    Allergies have been reviewed and updated in Epic: Yes    SPECIALTY MEDICATION ADHERENCE     Tacrolimus  0.5mg   : 7 days of medicine on hand   Mycophenolate  180mg   : 5 days of medicine on hand     Specialty medication is an injection or given on a cycle: No    Medication Adherence    Patient reported X missed doses in the last month: 0  Specialty Medication: tacrolimus  0.5 MG capsule (PROGRAF )  Patient is on additional specialty medications: Yes  Additional Specialty Medications: mycophenolate  180 MG EC tablet (MYFORTIC )  Patient Reported Additional Medication X Missed Doses in the Last Month: 0  Patient is on more than two specialty medications: No  Adherence tools used: patient uses a pill box to manage medications          Specialty medication(s) dose(s) confirmed: Regimen is correct and unchanged.     Are there any concerns with adherence? No    Adherence counseling provided? Not needed    CLINICAL MANAGEMENT AND INTERVENTION      Clinical Benefit Assessment:    Do you feel the medicine is effective or helping your condition? Yes    Clinical Benefit counseling provided? Not needed    Adverse Effects Assessment:    Are you experiencing any side effects? No    Are you experiencing difficulty administering your medicine? No    Quality of Life Assessment:    Quality of Life    Rheumatology  Oncology  Dermatology  Cystic Fibrosis How many days over the past month did your transplant  keep you from your normal activities? For example, brushing your teeth or getting up in the morning. 0    Have you discussed this with your provider? Not needed    Acute Infection Status:    Acute infections noted within Epic:  No active infections    Patient reported infection: None    Therapy Appropriateness:    Is the medication and dose appropriate considering the patient???s diagnosis, treatment, and disease journey, comorbidities, medical history, current medications, allergies, therapeutic goals, self-administration ability, and access barriers? Yes, therapy is appropriate and should be continued     Clinical Intervention:    Was an intervention completed as part of this clinical assessment? No    DISEASE/MEDICATION-SPECIFIC INFORMATION      N/A    Solid Organ Transplant: Not Applicable    PATIENT SPECIFIC NEEDS     Does the patient have any physical, cognitive, or cultural barriers? No    Is the patient high risk? Yes, patient is taking a REMS drug. Medication is dispensed in compliance with REMS program    Does the patient require physician intervention or other additional services (i.e., nutrition, smoking cessation, social work)? No    Does the patient have  an additional or emergency contact listed in their chart? Yes    SOCIAL DETERMINANTS OF HEALTH     At the [facility], we have learned that life circumstances - like trouble affording food, housing, utilities, or transportation can affect the health of many of our patients.   That is why we wanted to ask: are you currently experiencing any life circumstances that are negatively impacting your health and/or quality of life? Patient declined to answer    Social Drivers of Health     Food Insecurity: Not on file   Tobacco Use: Low Risk (08/13/2021)    Patient History     Smoking Tobacco Use: Never     Smokeless Tobacco Use: Never     Passive Exposure: Not on file   Transportation Needs: Unmet Transportation Needs (09/06/2021)    PRAPARE - Transportation     Lack of Transportation (Medical): Yes     Lack of Transportation (Non-Medical): Yes   Alcohol Use: Not on file   Housing: Not on file   Physical Activity: Not on file   Utilities: Not on file   Stress: Not on file   Interpersonal Safety: Not on file   Substance Use: Not on file (05/13/2023)   Intimate Partner Violence: Not on file   Social Connections: Not on file   Financial Resource Strain: Not on file   Health Literacy: Not on file   Internet Connectivity: Not on file       Would you be willing to receive help with any of the needs that you have identified today? Not applicable       SHIPPING     Specialty Medication(s) to be Shipped:   Transplant: mycophenolic acid 180mg  and tacrolimus  0.5mg     Other medication(s) to be shipped: chlorthalidone , cinacalcet     Specialty Medications not needed at this time: N/A     Changes to insurance: No    Cost and Payment: Patient has a copay of $16 total for the 4 meds each for 90ds $4 each. They are aware and have authorized the pharmacy to charge the credit card on file.    Delivery Scheduled: Yes, Expected medication delivery date: 07/20/2024.  However, Rx request for refills was sent to the provider as there are none remaining.     Medication will be delivered via Next Day Courier to the confirmed prescription address in [facility].    The patient will receive a drug information handout for each medication shipped and additional FDA Medication Guides as required.  Verified that patient has previously received a Conservation Officer, Historic Buildings and a Surveyor, Mining.    The patient or caregiver noted above participated in the development of this care plan and knows that they can request review of or adjustments to the care plan at any time.      All of the patient's questions and concerns have been addressed.    Lynn Erickson, PharmD   [facility] Special[facility] and Home Delivery Pharmacy Specialty Pharmacist         [1]   Current Outpatient Medications   Medication Sig Dispense Refill    chlorthalidone  (HYGROTON ) 25 MG tablet Take 1 tablet (25 mg total) by mouth every morning. 30 tablet 11    cinacalcet  (SENSIPAR ) 30 MG tablet Take 1 tablet (30 mg total) by mouth daily. 90 tablet 3    magnesium  oxide (MAG-OX) 400 mg (241.3 mg elemental magnesium ) tablet Take 1 tablet (400 mg total) by mouth Two (2)  times a day. 120 tablet 5    mycophenolate  (MYFORTIC ) 180 MG EC tablet Take 2 tablets (360 mg total) by mouth two (2) times a day. 120 tablet 11    pantoprazole  (PROTONIX ) 40 MG tablet Take 1 tablet (40 mg total) by mouth two (2) times a day. 60 tablet 11    rosuvastatin  (CRESTOR ) 10 MG tablet Take 1 tablet (10 mg total) by mouth daily. 90 tablet 3    tacrolimus  (PROGRAF ) 0.5 MG capsule Take 3 capsules (1.5 mg total) by mouth two (2) times a day 180 capsule 11     No current facility-administered medications for this visit.   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

## 2024-07-19 MED FILL — MYCOPHENOLATE SODIUM 180 MG TABLET,DELAYED RELEASE: ORAL | 90 days supply | Qty: 360 | Fill #6

## 2024-07-19 MED FILL — CINACALCET 30 MG TABLET: ORAL | 90 days supply | Qty: 90 | Fill #3

## 2024-07-19 MED FILL — TACROLIMUS 0.5 MG CAPSULE, IMMEDIATE-RELEASE: ORAL | 90 days supply | Qty: 540 | Fill #4
